# Patient Record
Sex: Male | Born: 1945 | Race: Black or African American | Hispanic: No | Marital: Married | Smoking: Former smoker
Health system: Southern US, Community
[De-identification: ages and names within clinical notes are randomized; demographics above are authoritative.]

## PROBLEM LIST (undated history)

## (undated) DIAGNOSIS — R569 Unspecified convulsions: Secondary | ICD-10-CM

## (undated) DIAGNOSIS — I10 Essential (primary) hypertension: Secondary | ICD-10-CM

## (undated) DIAGNOSIS — N19 Unspecified kidney failure: Secondary | ICD-10-CM

## (undated) DIAGNOSIS — F0390 Unspecified dementia without behavioral disturbance: Secondary | ICD-10-CM

## (undated) DIAGNOSIS — E119 Type 2 diabetes mellitus without complications: Secondary | ICD-10-CM

## (undated) DIAGNOSIS — K7290 Hepatic failure, unspecified without coma: Secondary | ICD-10-CM

## (undated) DIAGNOSIS — B1920 Unspecified viral hepatitis C without hepatic coma: Secondary | ICD-10-CM

---

## 2009-05-21 DIAGNOSIS — D631 Anemia in chronic kidney disease: Secondary | ICD-10-CM | POA: Diagnosis present

## 2009-05-21 DIAGNOSIS — D638 Anemia in other chronic diseases classified elsewhere: Secondary | ICD-10-CM

## 2009-05-21 DIAGNOSIS — D649 Anemia, unspecified: Secondary | ICD-10-CM

## 2014-09-01 HISTORY — PX: LIVER TRANSPLANT: SHX410

## 2014-09-01 HISTORY — PX: KIDNEY TRANSPLANT: SHX239

## 2015-09-02 DIAGNOSIS — R000 Tachycardia, unspecified: Secondary | ICD-10-CM

## 2015-09-02 HISTORY — PX: SVT ABLATION: EP1225

## 2015-09-02 HISTORY — DX: Tachycardia, unspecified: R00.0

## 2016-09-01 DIAGNOSIS — I728 Aneurysm of other specified arteries: Secondary | ICD-10-CM

## 2016-09-01 HISTORY — DX: Aneurysm of other specified arteries: I72.8

## 2017-09-01 DIAGNOSIS — I639 Cerebral infarction, unspecified: Secondary | ICD-10-CM

## 2017-09-01 HISTORY — DX: Cerebral infarction, unspecified: I63.9

## 2020-12-06 ENCOUNTER — Encounter (HOSPITAL_COMMUNITY): Payer: Self-pay

## 2020-12-06 ENCOUNTER — Emergency Department (HOSPITAL_COMMUNITY)
Admission: EM | Admit: 2020-12-06 | Discharge: 2020-12-06 | Disposition: A | Discharge status: Home / Self Care | Payer: Medicare Other | Attending: Emergency Medicine | Admitting: Emergency Medicine

## 2020-12-06 DIAGNOSIS — I441 Atrioventricular block, second degree: Secondary | ICD-10-CM

## 2020-12-06 DIAGNOSIS — R531 Weakness: Secondary | ICD-10-CM | POA: Insufficient documentation

## 2020-12-06 DIAGNOSIS — G40909 Epilepsy, unspecified, not intractable, without status epilepticus: Secondary | ICD-10-CM | POA: Insufficient documentation

## 2020-12-06 DIAGNOSIS — R569 Unspecified convulsions: Secondary | ICD-10-CM | POA: Diagnosis present

## 2020-12-06 HISTORY — DX: Unspecified convulsions: R56.9

## 2020-12-06 LAB — COMPREHENSIVE METABOLIC PANEL
ALT: 15 U/L (ref 0–44)
AST: 15 U/L (ref 15–41)
Albumin: 3.4 g/dL — ABNORMAL LOW (ref 3.5–5.0)
Alkaline Phosphatase: 72 U/L (ref 38–126)
Anion gap: 9 (ref 5–15)
BUN: 32 mg/dL — ABNORMAL HIGH (ref 8–23)
CO2: 20 mmol/L — ABNORMAL LOW (ref 22–32)
Calcium: 8.6 mg/dL — ABNORMAL LOW (ref 8.9–10.3)
Chloride: 114 mmol/L — ABNORMAL HIGH (ref 98–111)
Creatinine, Ser: 1.88 mg/dL — ABNORMAL HIGH (ref 0.61–1.24)
GFR, Estimated: 37 mL/min — ABNORMAL LOW (ref >60)
Glucose, Bld: 119 mg/dL — ABNORMAL HIGH (ref 70–99)
Potassium: 3.5 mmol/L (ref 3.5–5.1)
Sodium: 143 mmol/L (ref 135–145)
Total Bilirubin: 0.6 mg/dL (ref 0.3–1.2)
Total Protein: 6.7 g/dL (ref 6.5–8.1)

## 2020-12-06 LAB — CBC WITH DIFFERENTIAL/PLATELET
Abs Immature Granulocytes: 0.04 10*3/uL (ref 0.00–0.07)
Basophils Absolute: 0.1 10*3/uL (ref 0.0–0.1)
Basophils Relative: 1 %
Eosinophils Absolute: 0 10*3/uL (ref 0.0–0.5)
Eosinophils Relative: 1 %
HCT: 34.5 % — ABNORMAL LOW (ref 39.0–52.0)
Hemoglobin: 10.6 g/dL — ABNORMAL LOW (ref 13.0–17.0)
Immature Granulocytes: 1 %
Lymphocytes Relative: 10 %
Lymphs Abs: 0.8 10*3/uL (ref 0.7–4.0)
MCH: 23.8 pg — ABNORMAL LOW (ref 26.0–34.0)
MCHC: 30.7 g/dL (ref 30.0–36.0)
MCV: 77.5 fL — ABNORMAL LOW (ref 80.0–100.0)
Monocytes Absolute: 0.4 10*3/uL (ref 0.1–1.0)
Monocytes Relative: 5 %
Neutro Abs: 7.3 10*3/uL (ref 1.7–7.7)
Neutrophils Relative %: 82 %
Platelets: 274 10*3/uL (ref 150–400)
RBC: 4.45 MIL/uL (ref 4.22–5.81)
RDW: 17.1 % — ABNORMAL HIGH (ref 11.5–15.5)
WBC: 8.8 10*3/uL (ref 4.0–10.5)
nRBC: 0 % (ref 0.0–0.2)

## 2020-12-06 LAB — ETHANOL: Alcohol, Ethyl (B): <10 mg/dL (ref <10)

## 2020-12-06 MED ORDER — LEVETIRACETAM IN NACL 1000 MG/100ML IV SOLN
1000.0000 mg | Freq: Once | INTRAVENOUS | Status: AC
  Administered 2020-12-06: 1000 mg via INTRAVENOUS
  Filled 2020-12-06: qty 100

## 2020-12-06 NOTE — ED Triage Notes (Signed)
Pt comes via [facility] from home, seizure witnessed by wife lasting about 2 minutes, hx of the same, wife forgot to give him his keppa last night, pt post ictal, disoriented x 4.

## 2020-12-06 NOTE — Consult Note (Signed)
Cardiology Consultation:   Patient ID: Gilbert Wilson MRN: [id number]; DOB: 12-01-1945  Admit date: 12/06/2020 Date of Consult: 12/06/2020  PCP:  Pcp, No   [place]  Cardiologist: [place]. (will established care at [facility])   Patient Profile:   Gilbert Wilson is a 75 y.o. 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   Patient moved from [place] with family February 2022.  Patient with history of hypertension since teenager.  His stroke felt secondary to uncontrolled hypertension.  He is on Plavix for this. Patient with history of tachycardia and underwent ablation in 2017 @ [place].  Family unable to provide type of tachycardia.  Patient had a cardiac catheterization without evidence of CAD.  Never told of abnormal echocardiogram.  History of Present Illness:   Mr. Luft brought by EMS for witnessed seizure at home by wife.  Patient with cognitive impairment.  History mostly provided by daughter at bedside and wife on phone.  For some reason family forgot to give his Keppra for past few days.  He is wheelchair-bound.  He denies chest pain, shortness of breath, orthopnea, PND, syncope, lower extremity edema or melena.  His seizure lasted for about 2 minutes.  He was given IV Keppra in emergency room.  Serum creatinine 1.88 Potassium 3.5 Hemoglobin 10.6  Patient noted to be bradycardic in emergency room and cardiology asked for further evaluation. Family denies prior history of bradycardia, dizziness or syncope.   Past Medical History:  Diagnosis Date  . Seizure ([place])    As above. History reviewed. No pertinent surgical history.   Home  Medications:  Prior to Admission medications   Not on File   Inpatient Medications: Scheduled Meds:  Continuous Infusions:  PRN Meds:   Allergies:   No Known Allergies  Social History:   Social History   Socioeconomic History  . Marital status: Married    Spouse name: Not on file  . Number of children: Not on file  . Years of education: Not on file  . Highest education level: Not on file  Occupational History  . Not on file  Tobacco Use  . Smoking status: Not on file  . Smokeless tobacco: Not on file  Substance and Sexual Activity  . Alcohol use: Not on file  . Drug use: Not on file  . Sexual activity: Not on file  Other Topics Concern  . Not on file  Social History Narrative  . Not on file   Social Determinants of Health   Financial Resource Strain: Not on file  Food Insecurity: Not on file  Transportation Needs: Not on file  Physical Activity: Not on file  Stress: Not on file  Social Connections: Not on file  Intimate Partner Violence: Not on file    Family History:   Denies family history of CAD  ROS:  Please see the history of present illness.  All other ROS reviewed and negative.     Physical Exam/Data:   Vitals:   12/06/20 1215 12/06/20 1230 12/06/20 1245 12/06/20 1300  BP: (!) 158/73 (!) 149/70 (!) 162/68 (!) 153/78  Pulse: (!) 55 78 74 78  Resp: 19 (!) '21 12 14  '$ Temp:  TempSrc:      SpO2: 96% 96% 99% 96%   No intake or output data in the 24 hours ending 12/06/20 1333 No flowsheet data found.   There is no height or weight on file to calculate BMI.  General:  Well nourished, well developed, in no acute distress HEENT: normal Lymph: no adenopathy Neck: no JVD Endocrine:  No thryomegaly Vascular: No carotid bruits; FA pulses 2+ bilaterally without bruits  Cardiac:  normal S1, S2; RRR; no murmur Lungs:  clear to auscultation bilaterally, no wheezing, rhonchi or rales  Abd: soft, nontender, no hepatomegaly  Ext: no  edema Musculoskeletal:  No deformities,  Skin: warm and dry  Neuro:  Right sided weakness  Psych:  Dementia   EKG:  The EKG was personally reviewed and demonstrates:  SR, HR 77, prolonged PR internal Telemetry:  Telemetry was personally reviewed and demonstrates: Predominantly sinus rhythm at heart rate of 70s, intermittent Wenckebach bradycardia  Relevant CV Studies: N/A  Laboratory Data:  Chemistry Recent Labs  Lab 12/06/20 0743  NA 143  K 3.5  CL 114*  CO2 20*  GLUCOSE 119*  BUN 32*  CREATININE 1.88*  CALCIUM 8.6*  GFRNONAA 37*  ANIONGAP 9    Recent Labs  Lab 12/06/20 0743  PROT 6.7  ALBUMIN 3.4*  AST 15  ALT 15  ALKPHOS 72  BILITOT 0.6   Hematology Recent Labs  Lab 12/06/20 0743  WBC 8.8  RBC 4.45  HGB 10.6*  HCT 34.5*  MCV 77.5*  MCH 23.8*  MCHC 30.7  RDW 17.1*  PLT 274    Radiology/Studies:  No results found.   Assessment and Plan:   1. Mobitz type I AV block (Wenckebach) -Patient with prior history of ablation for tachycardia (unknown) in 2017 at [place]. -Patient was never told of any bradycardia or arrhythmia requiring anticoagulation -Review of telemetry showed predominately normal sinus rhythm with intermittent Mobitz type I AV block with first-degree AV block. -He is not on any AV nodal blocking agent however on nifedipine which may have minimal effect on HR - His bradycardia is a incidental findings and pt is asymptomatic.  -May consider event monitor in outpatient setting.  He has appointment with primary care provider at [facility] next week.  From there he may refer to cardiology for further work-up.  Family prefers to stays in [place] system.  2.  Seizure disorder -Recommended compliance with medications  3 CKD stage III -Likely due to history of renal transplant -Unknown baseline renal function  4.  Hypertension -Blood pressure is elevated -Family reported patient with history of hypertension since teenager   Risk  Assessment/Risk Scores:    For questions or updates, please contact [place] Please consult [email or web address] for contact info under    Jarrett Soho, PA  12/06/2020 1:33 PM

## 2020-12-06 NOTE — Discharge Instructions (Addendum)
I suspect that your seizure was due to not taking your Keppra.  Your levetiracetam (Keppra) level is still pending  However, based on history I suspect that it will be low and subtherapeutic.   Resume your Keppra this evening and continue with the 500 mg twice daily.  You will need to follow-up with the [facility] next week for your appointment, as scheduled.  I suspect having specialty follow-up with both cardiology and neurology.  You need to be seeing a neurologist for your seizure disorder and cardiologist for your arrhythmia.  Please return to the ED or seek immediate medical attention should you experience any new or worsening symptoms.

## 2020-12-06 NOTE — ED Provider Notes (Signed)
Gilbert Wilson Provider Note   CSN: [id number] Arrival date & time: 12/06/20  [id number]     History Chief Complaint  Patient presents with  . Seizures    Gilbert Wilson is a 75 y.o. male with past medical history significant for CVA with right-sided weakness on Plavix, seizure disorder on Keppra 500 mg twice daily, and on immunotherapy s/p kidney and liver transplant who presents the ED via EMS accompanied by his wife for seizure.  On my examination, wife is teary-eyed and feels guilty because her husband had been without his Keppra for what she suspects is a few days.  He has a large amount of medicines that he takes regularly.  She states that he has cognitive impairment and residual right-sided weakness from his stroke for which she is on Plavix and takes regularly.  She states that they just moved down to [place] from [place] and have an appointment with the [place] next week.  She would like to keep both his primary care and neurology services within the [place] system.    Patient is resting comfortably and is in no acute distress.  He is answering my questions appropriately.  He cannot tell me the year, but patient's wife states that this is his cognitive baseline.  They deny any recent fevers, chills, or other illness.  He denies any illicit drug use.   His seizures are full body convulsions and today's episode lasted about 6 minutes.  She states that he was initially confused after the seizure ended, but he started to come around by the time EMS arrived.  He denies any tongue biting.  He is wheelchair-bound and incontinent at baseline.  HPI     Past Medical History:  Diagnosis Date  . Seizure ([place])     There are no problems to display for this patient.   History reviewed. No pertinent surgical history.     No family history on file.     Home Medications Prior to Admission medications   Not on File    Allergies    Patient has no  known allergies.  Review of Systems   Review of Systems  All other systems reviewed and are negative.   Physical Exam Updated Vital Signs BP (!) 170/69   Pulse 76   Temp (!) 97 F (36.1 C) (Temporal)   Resp 14   SpO2 100%   Physical Exam Vitals and nursing note reviewed. Exam conducted with a chaperone present.  Constitutional:      General: He is not in acute distress.    Appearance: He is not toxic-appearing.  HENT:     Head: Normocephalic and atraumatic.     Mouth/Throat:     Pharynx: Oropharynx is clear.     Comments: No tongue biting. Eyes:     General: No scleral icterus.    Conjunctiva/sclera: Conjunctivae normal.  Cardiovascular:     Rate and Rhythm: Normal rate.     Pulses: Normal pulses.     Comments: Right arm radial pulse with thrill, hx AV fistula. Pulmonary:     Effort: Pulmonary effort is normal. No respiratory distress.  Musculoskeletal:     Cervical back: Normal range of motion.  Skin:    General: Skin is dry.  Neurological:     Mental Status: He is alert.     GCS: GCS eye subscore is 4. GCS verbal subscore is 5. GCS motor subscore is 6.     Comments: Right-sided extremity  weakness.  No facial droop.  PERRL and EOM intact.  No nystagmus.  Sensation grossly intact and symmetric bilaterally.  Cannot tell me the year, but cognitive baseline.  Psychiatric:        Mood and Affect: Mood normal.        Behavior: Behavior normal.        Thought Content: Thought content normal.     ED Results / Procedures / Treatments   Labs (all labs ordered are listed, but only abnormal results are displayed) Labs Reviewed  CBC WITH DIFFERENTIAL/PLATELET - Abnormal; Notable for the following components:      Result Value   Hemoglobin 10.6 (*)    HCT 34.5 (*)    MCV 77.5 (*)    MCH 23.8 (*)    RDW 17.1 (*)    All other components within normal limits  COMPREHENSIVE METABOLIC PANEL - Abnormal; Notable for the following components:   Chloride 114 (*)    CO2 20  (*)    Glucose, Bld 119 (*)    BUN 32 (*)    Creatinine, Ser 1.88 (*)    Calcium 8.6 (*)    Albumin 3.4 (*)    GFR, Estimated 37 (*)    All other components within normal limits  ETHANOL  LEVETIRACETAM LEVEL    EKG EKG Interpretation  Date/Time:  Thursday December 06 2020 08:47:30 EDT Ventricular Rate:  77 PR Interval:  317 QRS Duration: 107 QT Interval:  429 QTC Calculation: 486 R Axis:   -37 Text Interpretation: Sinus rhythm Prolonged PR interval Left ventricular hypertrophy Borderline prolonged QT interval Confirmed by Fredia Sorrow [phone]) on 12/06/2020 8:57:39 AM   Radiology No results found.  Procedures Procedures   Medications Ordered in ED Medications  levETIRAcetam (KEPPRA) IVPB 1000 mg/100 mL premix (0 mg Intravenous Stopped 12/06/20 0944)    ED Course  I have reviewed the triage vital signs and the nursing notes.  Pertinent labs & imaging results that were available during my care of the patient were reviewed by me and considered in my medical decision making (see chart for details).    MDM Rules/Calculators/A&P                          Gilbert Wilson was evaluated in [facility] on 12/06/2020 for the symptoms described in the history of present illness. He was evaluated in the context of the global COVID-19 pandemic, which necessitated consideration that the patient might be at risk for infection with the SARS-CoV-2 virus that causes COVID-19. Institutional protocols and algorithms that pertain to the evaluation of patients at risk for COVID-19 are in a state of rapid change based on information released by regulatory bodies including the CDC and federal and state organizations. These policies and algorithms were followed during the patient's care in the ED.  I personally reviewed patient's medical chart and all notes from triage and staff during today's encounter. I have also ordered and reviewed all labs and imaging that I felt to be medically  necessary in the evaluation of this patient's complaints and with consideration of their physical exam. If needed, translation services were available and utilized.   I suspect that his seizure today was due to medication noncompliance, however wife is taking the necessary steps to rectify state.  She was teary-eyed on my initial examination and remorseful.  She is doing an excellent job caring for her husband who has significant comorbid disease.  They just moved  down here from [place] and plan to establish with the [place].  They have an appointment next week with primary care and also would like to have neurology services there as well.  She declines referral to [facility] or [facility].   Patient is resting comfortably on my exam.  Will Keppra load him given that he has been without Keppra medication for quite some time.  Laboratory work-up demonstrating mild anemia with hemoglobin 10.6 and renal impairment with creatinine elevated at 1.88, mildly elevated BUN 32, and reduced GFR to 37.  No recent labs with which to compare.  Unclear baseline.  However, wife at bedside states that his creatinine is typically 1.9-2.0 and GFR is typically low 30s.  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  He is resting comfortably and does not appear to be particularly ill.  Patient and wife at bedside states that he has been eating and drinking well.  EKG notable for prolonged PR interval suggesting first-degree heart block.  However, while on the monitor in the room, he has periodically dropped to heart rate as low as 37 and with the EKG findings suggesting second-degree heart block, unclear type I versus type II.  He is asymptomatic and denying any palpitations or chest discomfort.  No shortness of breath.  His blood pressure has been stable.  Wife at bedside states that this is happened before.  No history of ICD placement.  He did have an  ablation in the past.  Will consult cardiology to see patient.  I spoke with Trish from Cath Lab at 9:55 AM.  Plan is for him to be evaluated by cardiology at bedside.  I confirmed with Trish at 12:45 PM that plan is still for cardiology evaluation at bedside.  Patient continues to be resting comfortably and in no acute distress.  He continues to deny any cardiac complaints.  Wife is not wanting a cardiac monitor and states that he has dipped as low as 50, but no further significant bradycardia.  Patient was evaluated by cardiology at bedside.  They note that he has a second-degree AV block, type I.  He is cleared from their perspective and feel as though he can follow-up with [facility] cardiology [facility].  They plan to follow-up with the [facility] for their appointment as scheduled next week.  ED return precautions discussed.  Patient and wife at bedside voiced understanding and are agreeable to the plan.  They will resume taking Keppra, as scheduled, this evening.    Final Clinical Impression(s) / ED Diagnoses Final diagnoses:  Seizure [facility])    Rx / [facility] Orders ED Discharge Orders    None       Corena Herter, PA-C 12/06/20 1422    Fredia Sorrow, MD 12/07/20 [phone]

## 2020-12-11 LAB — LEVETIRACETAM LEVEL: Levetiracetam Lvl: <1 ug/mL — ABNORMAL LOW (ref 10.0–40.0)

## 2021-07-04 ENCOUNTER — Emergency Department (HOSPITAL_COMMUNITY): Payer: No Typology Code available for payment source

## 2021-07-04 ENCOUNTER — Encounter (HOSPITAL_COMMUNITY): Payer: Self-pay | Admitting: Pharmacy Technician

## 2021-07-04 ENCOUNTER — Other Ambulatory Visit: Payer: Self-pay

## 2021-07-04 ENCOUNTER — Inpatient Hospital Stay (HOSPITAL_COMMUNITY)
Admission: EM | Admit: 2021-07-04 | Discharge: 2021-07-07 | DRG: 074 | Disposition: A | Discharge status: Home Health | Payer: No Typology Code available for payment source | Attending: Internal Medicine | Admitting: Internal Medicine

## 2021-07-04 DIAGNOSIS — I951 Orthostatic hypotension: Secondary | ICD-10-CM | POA: Diagnosis present

## 2021-07-04 DIAGNOSIS — Z888 Allergy status to other drugs, medicaments and biological substances status: Secondary | ICD-10-CM | POA: Diagnosis not present

## 2021-07-04 DIAGNOSIS — Z7902 Long term (current) use of antithrombotics/antiplatelets: Secondary | ICD-10-CM

## 2021-07-04 DIAGNOSIS — F0390 Unspecified dementia without behavioral disturbance: Secondary | ICD-10-CM | POA: Diagnosis present

## 2021-07-04 DIAGNOSIS — N400 Enlarged prostate without lower urinary tract symptoms: Secondary | ICD-10-CM | POA: Diagnosis present

## 2021-07-04 DIAGNOSIS — I1310 Hypertensive heart and chronic kidney disease without heart failure, with stage 1 through stage 4 chronic kidney disease, or unspecified chronic kidney disease: Secondary | ICD-10-CM | POA: Diagnosis not present

## 2021-07-04 DIAGNOSIS — E1151 Type 2 diabetes mellitus with diabetic peripheral angiopathy without gangrene: Secondary | ICD-10-CM | POA: Diagnosis present

## 2021-07-04 DIAGNOSIS — G909 Disorder of the autonomic nervous system, unspecified: Principal | ICD-10-CM | POA: Diagnosis present

## 2021-07-04 DIAGNOSIS — Z87891 Personal history of nicotine dependence: Secondary | ICD-10-CM

## 2021-07-04 DIAGNOSIS — I69351 Hemiplegia and hemiparesis following cerebral infarction affecting right dominant side: Secondary | ICD-10-CM

## 2021-07-04 DIAGNOSIS — I129 Hypertensive chronic kidney disease with stage 1 through stage 4 chronic kidney disease, or unspecified chronic kidney disease: Secondary | ICD-10-CM | POA: Diagnosis present

## 2021-07-04 DIAGNOSIS — D631 Anemia in chronic kidney disease: Secondary | ICD-10-CM | POA: Diagnosis present

## 2021-07-04 DIAGNOSIS — G903 Multi-system degeneration of the autonomic nervous system: Secondary | ICD-10-CM | POA: Diagnosis present

## 2021-07-04 DIAGNOSIS — N1832 Chronic kidney disease, stage 3b: Secondary | ICD-10-CM | POA: Diagnosis present

## 2021-07-04 DIAGNOSIS — E1143 Type 2 diabetes mellitus with diabetic autonomic (poly)neuropathy: Secondary | ICD-10-CM | POA: Diagnosis present

## 2021-07-04 DIAGNOSIS — N2889 Other specified disorders of kidney and ureter: Secondary | ICD-10-CM | POA: Diagnosis not present

## 2021-07-04 DIAGNOSIS — E669 Obesity, unspecified: Secondary | ICD-10-CM | POA: Diagnosis present

## 2021-07-04 DIAGNOSIS — I151 Hypertension secondary to other renal disorders: Secondary | ICD-10-CM | POA: Diagnosis not present

## 2021-07-04 DIAGNOSIS — Z79899 Other long term (current) drug therapy: Secondary | ICD-10-CM

## 2021-07-04 DIAGNOSIS — I441 Atrioventricular block, second degree: Secondary | ICD-10-CM | POA: Diagnosis present

## 2021-07-04 DIAGNOSIS — Z833 Family history of diabetes mellitus: Secondary | ICD-10-CM

## 2021-07-04 DIAGNOSIS — Z940 Kidney transplant status: Secondary | ICD-10-CM | POA: Diagnosis not present

## 2021-07-04 DIAGNOSIS — Z20822 Contact with and (suspected) exposure to covid-19: Secondary | ICD-10-CM | POA: Diagnosis present

## 2021-07-04 DIAGNOSIS — N189 Chronic kidney disease, unspecified: Secondary | ICD-10-CM | POA: Diagnosis not present

## 2021-07-04 DIAGNOSIS — R001 Bradycardia, unspecified: Secondary | ICD-10-CM | POA: Diagnosis present

## 2021-07-04 DIAGNOSIS — E1122 Type 2 diabetes mellitus with diabetic chronic kidney disease: Secondary | ICD-10-CM | POA: Diagnosis present

## 2021-07-04 DIAGNOSIS — Z7960 Long term (current) use of unspecified immunomodulators and immunosuppressants: Secondary | ICD-10-CM

## 2021-07-04 DIAGNOSIS — Z7189 Other specified counseling: Secondary | ICD-10-CM | POA: Diagnosis not present

## 2021-07-04 DIAGNOSIS — G40909 Epilepsy, unspecified, not intractable, without status epilepticus: Secondary | ICD-10-CM | POA: Diagnosis present

## 2021-07-04 DIAGNOSIS — R32 Unspecified urinary incontinence: Secondary | ICD-10-CM | POA: Diagnosis present

## 2021-07-04 DIAGNOSIS — Z515 Encounter for palliative care: Secondary | ICD-10-CM | POA: Diagnosis not present

## 2021-07-04 DIAGNOSIS — Z7401 Bed confinement status: Secondary | ICD-10-CM | POA: Diagnosis not present

## 2021-07-04 DIAGNOSIS — R531 Weakness: Secondary | ICD-10-CM | POA: Diagnosis not present

## 2021-07-04 DIAGNOSIS — Z8619 Personal history of other infectious and parasitic diseases: Secondary | ICD-10-CM | POA: Diagnosis not present

## 2021-07-04 DIAGNOSIS — R55 Syncope and collapse: Secondary | ICD-10-CM | POA: Diagnosis not present

## 2021-07-04 DIAGNOSIS — R9431 Abnormal electrocardiogram [ECG] [EKG]: Secondary | ICD-10-CM | POA: Diagnosis not present

## 2021-07-04 DIAGNOSIS — D84821 Immunodeficiency due to drugs: Secondary | ICD-10-CM | POA: Diagnosis present

## 2021-07-04 DIAGNOSIS — Z6826 Body mass index (BMI) 26.0-26.9, adult: Secondary | ICD-10-CM

## 2021-07-04 DIAGNOSIS — Z944 Liver transplant status: Secondary | ICD-10-CM

## 2021-07-04 DIAGNOSIS — Z8249 Family history of ischemic heart disease and other diseases of the circulatory system: Secondary | ICD-10-CM

## 2021-07-04 HISTORY — DX: Unspecified viral hepatitis C without hepatic coma: B19.20

## 2021-07-04 HISTORY — DX: Hepatic failure, unspecified without coma: K72.90

## 2021-07-04 HISTORY — DX: Unspecified dementia, unspecified severity, without behavioral disturbance, psychotic disturbance, mood disturbance, and anxiety: F03.90

## 2021-07-04 HISTORY — DX: Unspecified kidney failure: N19

## 2021-07-04 HISTORY — DX: Essential (primary) hypertension: I10

## 2021-07-04 HISTORY — DX: Type 2 diabetes mellitus without complications: E11.9

## 2021-07-04 LAB — COMPREHENSIVE METABOLIC PANEL
ALT: 10 U/L (ref 0–44)
AST: 11 U/L — ABNORMAL LOW (ref 15–41)
Albumin: 3.3 g/dL — ABNORMAL LOW (ref 3.5–5.0)
Alkaline Phosphatase: 87 U/L (ref 38–126)
Anion gap: 9 (ref 5–15)
BUN: 30 mg/dL — ABNORMAL HIGH (ref 8–23)
CO2: 21 mmol/L — ABNORMAL LOW (ref 22–32)
Calcium: 8.8 mg/dL — ABNORMAL LOW (ref 8.9–10.3)
Chloride: 111 mmol/L (ref 98–111)
Creatinine, Ser: 1.93 mg/dL — ABNORMAL HIGH (ref 0.61–1.24)
GFR, Estimated: 36 mL/min — ABNORMAL LOW (ref >60)
Glucose, Bld: 101 mg/dL — ABNORMAL HIGH (ref 70–99)
Potassium: 4.1 mmol/L (ref 3.5–5.1)
Sodium: 141 mmol/L (ref 135–145)
Total Bilirubin: 0.8 mg/dL (ref 0.3–1.2)
Total Protein: 7 g/dL (ref 6.5–8.1)

## 2021-07-04 LAB — CBC WITH DIFFERENTIAL/PLATELET
Abs Immature Granulocytes: 0.01 10*3/uL (ref 0.00–0.07)
Basophils Absolute: 0.1 10*3/uL (ref 0.0–0.1)
Basophils Relative: 1 %
Eosinophils Absolute: 0.1 10*3/uL (ref 0.0–0.5)
Eosinophils Relative: 2 %
HCT: 37.6 % — ABNORMAL LOW (ref 39.0–52.0)
Hemoglobin: 11.5 g/dL — ABNORMAL LOW (ref 13.0–17.0)
Immature Granulocytes: 0 %
Lymphocytes Relative: 30 %
Lymphs Abs: 1.3 10*3/uL (ref 0.7–4.0)
MCH: 23.7 pg — ABNORMAL LOW (ref 26.0–34.0)
MCHC: 30.6 g/dL (ref 30.0–36.0)
MCV: 77.5 fL — ABNORMAL LOW (ref 80.0–100.0)
Monocytes Absolute: 0.4 10*3/uL (ref 0.1–1.0)
Monocytes Relative: 9 %
Neutro Abs: 2.5 10*3/uL (ref 1.7–7.7)
Neutrophils Relative %: 58 %
Platelets: 233 10*3/uL (ref 150–400)
RBC: 4.85 MIL/uL (ref 4.22–5.81)
RDW: 15.3 % (ref 11.5–15.5)
WBC: 4.4 10*3/uL (ref 4.0–10.5)
nRBC: 0 % (ref 0.0–0.2)

## 2021-07-04 LAB — IRON AND TIBC
Iron: 62 ug/dL (ref 45–182)
Saturation Ratios: 28 % (ref 17.9–39.5)
TIBC: 220 ug/dL — ABNORMAL LOW (ref 250–450)
UIBC: 158 ug/dL

## 2021-07-04 LAB — GLUCOSE, CAPILLARY: Glucose-Capillary: 156 mg/dL — ABNORMAL HIGH (ref 70–99)

## 2021-07-04 LAB — PROTIME-INR
INR: 1.2 (ref 0.8–1.2)
Prothrombin Time: 14.7 seconds (ref 11.4–15.2)

## 2021-07-04 LAB — RESP PANEL BY RT-PCR (FLU A&B, COVID) ARPGX2
Influenza A by PCR: NEGATIVE
Influenza B by PCR: NEGATIVE
SARS Coronavirus 2 by RT PCR: NEGATIVE

## 2021-07-04 LAB — FERRITIN: Ferritin: 116 ng/mL (ref 24–336)

## 2021-07-04 LAB — TSH: TSH: 2.28 u[IU]/mL (ref 0.350–4.500)

## 2021-07-04 LAB — HEMOGLOBIN A1C
Hgb A1c MFr Bld: 6.4 % — ABNORMAL HIGH (ref 4.8–5.6)
Mean Plasma Glucose: 136.98 mg/dL

## 2021-07-04 LAB — ETHANOL: Alcohol, Ethyl (B): <10 mg/dL (ref <10)

## 2021-07-04 LAB — MAGNESIUM: Magnesium: 1.8 mg/dL (ref 1.7–2.4)

## 2021-07-04 MED ORDER — SODIUM CHLORIDE 0.9 % IV SOLN: INTRAVENOUS | Status: DC

## 2021-07-04 MED ORDER — SODIUM BICARBONATE 650 MG PO TABS
1300.0000 mg | ORAL_TABLET | Freq: Two times a day (BID) | ORAL | Status: DC
  Administered 2021-07-04 – 2021-07-07 (×6): 1300 mg via ORAL
  Filled 2021-07-04 (×6): qty 2

## 2021-07-04 MED ORDER — ONDANSETRON HCL 4 MG PO TABS: 4.0000 mg | ORAL_TABLET | Freq: Four times a day (QID) | ORAL | Status: DC | PRN

## 2021-07-04 MED ORDER — SENNOSIDES-DOCUSATE SODIUM 8.6-50 MG PO TABS: 1.0000 | ORAL_TABLET | Freq: Every evening | ORAL | Status: DC | PRN

## 2021-07-04 MED ORDER — INSULIN ASPART 100 UNIT/ML IJ SOLN
0.0000 [IU] | Freq: Three times a day (TID) | INTRAMUSCULAR | Status: DC
  Administered 2021-07-05 – 2021-07-06 (×3): 3 [IU] via SUBCUTANEOUS
  Administered 2021-07-07: 5 [IU] via SUBCUTANEOUS

## 2021-07-04 MED ORDER — SODIUM CHLORIDE 0.9 % IV SOLN: 250.0000 mL | INTRAVENOUS | Status: DC

## 2021-07-04 MED ORDER — ENOXAPARIN SODIUM 30 MG/0.3ML IJ SOSY
30.0000 mg | PREFILLED_SYRINGE | INTRAMUSCULAR | Status: DC
  Administered 2021-07-04 – 2021-07-05 (×2): 30 mg via SUBCUTANEOUS
  Filled 2021-07-04 (×2): qty 0.3

## 2021-07-04 MED ORDER — ACETAMINOPHEN 650 MG RE SUPP: 650.0000 mg | Freq: Four times a day (QID) | RECTAL | Status: DC | PRN

## 2021-07-04 MED ORDER — SODIUM CHLORIDE 0.9 % IV BOLUS
1000.0000 mL | Freq: Once | INTRAVENOUS | Status: AC
  Administered 2021-07-04: 1000 mL via INTRAVENOUS

## 2021-07-04 MED ORDER — CEFAZOLIN SODIUM-DEXTROSE 2-4 GM/100ML-% IV SOLN
2.0000 g | INTRAVENOUS | Status: DC
  Filled 2021-07-04: qty 100

## 2021-07-04 MED ORDER — SODIUM CHLORIDE 0.9% FLUSH: 3.0000 mL | Freq: Two times a day (BID) | INTRAVENOUS | Status: DC

## 2021-07-04 MED ORDER — SODIUM CHLORIDE 0.9% FLUSH: 3.0000 mL | INTRAVENOUS | Status: DC | PRN

## 2021-07-04 MED ORDER — CALCITRIOL 0.25 MCG PO CAPS
0.2500 ug | ORAL_CAPSULE | Freq: Every day | ORAL | Status: DC
Start: 2021-07-04 — End: 2021-07-08
  Administered 2021-07-04 – 2021-07-07 (×4): 0.25 ug via ORAL
  Filled 2021-07-04 (×4): qty 1

## 2021-07-04 MED ORDER — CYCLOSPORINE 100 MG PO CAPS: 100.0000 mg | ORAL_CAPSULE | Freq: Two times a day (BID) | ORAL | Status: DC

## 2021-07-04 MED ORDER — CYCLOSPORINE MODIFIED (NEORAL) 25 MG PO CAPS
100.0000 mg | ORAL_CAPSULE | Freq: Two times a day (BID) | ORAL | Status: DC
  Administered 2021-07-04 – 2021-07-07 (×6): 100 mg via ORAL
  Filled 2021-07-04 (×7): qty 4

## 2021-07-04 MED ORDER — MIRTAZAPINE 15 MG PO TABS
15.0000 mg | ORAL_TABLET | Freq: Every day | ORAL | Status: DC
  Administered 2021-07-04 – 2021-07-06 (×3): 15 mg via ORAL
  Filled 2021-07-04 (×3): qty 1

## 2021-07-04 MED ORDER — CINACALCET HCL 30 MG PO TABS
30.0000 mg | ORAL_TABLET | Freq: Two times a day (BID) | ORAL | Status: DC
  Administered 2021-07-05 – 2021-07-07 (×5): 30 mg via ORAL
  Filled 2021-07-04 (×5): qty 1

## 2021-07-04 MED ORDER — INSULIN ASPART 100 UNIT/ML IJ SOLN: 0.0000 [IU] | Freq: Three times a day (TID) | INTRAMUSCULAR | Status: DC

## 2021-07-04 MED ORDER — ONDANSETRON HCL 4 MG/2ML IJ SOLN: 4.0000 mg | Freq: Four times a day (QID) | INTRAMUSCULAR | Status: DC | PRN

## 2021-07-04 MED ORDER — MAGNESIUM OXIDE -MG SUPPLEMENT 400 (240 MG) MG PO TABS
400.0000 mg | ORAL_TABLET | Freq: Every day | ORAL | Status: DC
  Administered 2021-07-04 – 2021-07-07 (×4): 400 mg via ORAL
  Filled 2021-07-04 (×6): qty 1

## 2021-07-04 MED ORDER — CLOPIDOGREL BISULFATE 75 MG PO TABS: 75.0000 mg | ORAL_TABLET | Freq: Every day | ORAL | Status: DC

## 2021-07-04 MED ORDER — LOSARTAN POTASSIUM 50 MG PO TABS
50.0000 mg | ORAL_TABLET | Freq: Every day | ORAL | Status: DC
  Administered 2021-07-04 – 2021-07-06 (×3): 50 mg via ORAL
  Filled 2021-07-04 (×3): qty 1

## 2021-07-04 MED ORDER — ACETAMINOPHEN 325 MG PO TABS: 650.0000 mg | ORAL_TABLET | Freq: Four times a day (QID) | ORAL | Status: DC | PRN

## 2021-07-04 MED ORDER — ROSUVASTATIN CALCIUM 5 MG PO TABS
10.0000 mg | ORAL_TABLET | Freq: Every evening | ORAL | Status: DC
  Administered 2021-07-04 – 2021-07-07 (×4): 10 mg via ORAL
  Filled 2021-07-04 (×4): qty 2

## 2021-07-04 MED ORDER — MYCOPHENOLATE SODIUM 180 MG PO TBEC
180.0000 mg | DELAYED_RELEASE_TABLET | Freq: Two times a day (BID) | ORAL | Status: DC
  Administered 2021-07-04 – 2021-07-07 (×6): 180 mg via ORAL
  Filled 2021-07-04 (×6): qty 1

## 2021-07-04 MED ORDER — ENOXAPARIN SODIUM 40 MG/0.4ML IJ SOSY: 40.0000 mg | PREFILLED_SYRINGE | INTRAMUSCULAR | Status: DC

## 2021-07-04 MED ORDER — NIFEDIPINE ER OSMOTIC RELEASE 60 MG PO TB24
60.0000 mg | ORAL_TABLET | Freq: Two times a day (BID) | ORAL | Status: DC
  Administered 2021-07-04: 60 mg via ORAL
  Filled 2021-07-04 (×2): qty 1

## 2021-07-04 MED ORDER — SODIUM CHLORIDE 0.9 % IV SOLN
80.0000 mg | INTRAVENOUS | Status: DC
  Filled 2021-07-04: qty 2

## 2021-07-04 MED ORDER — LEVETIRACETAM 500 MG PO TABS
500.0000 mg | ORAL_TABLET | Freq: Two times a day (BID) | ORAL | Status: DC
  Administered 2021-07-04 – 2021-07-07 (×6): 500 mg via ORAL
  Filled 2021-07-04 (×6): qty 1

## 2021-07-04 MED ORDER — FINASTERIDE 5 MG PO TABS
5.0000 mg | ORAL_TABLET | Freq: Every evening | ORAL | Status: DC
  Administered 2021-07-04 – 2021-07-07 (×4): 5 mg via ORAL
  Filled 2021-07-04 (×4): qty 1

## 2021-07-04 NOTE — ED Provider Notes (Signed)
[facility] EMERGENCY DEPARTMENT Provider Note   CSN: [id number] Arrival date & time: 07/04/21  1113     History No chief complaint on file.   Gilbert Wilson is a 75 y.o. male.  HPI Patient with history of dementia presents from home with his wife who assists with the history.  They present at the request of his primary care physician and a Hydrologist. She notes that in addition to the patient's dementia is a history of seizures, prior stroke.  After a seizure-like episode about 2 weeks ago the patient has been more weak than usual, with no additional falls, but with instability with upright positioning, minimal ambulation.  No report of fever, vomiting, diarrhea.  The patient himself answers intermittent questions briefly, though with clear speech.  Level 5 caveat secondary to dementia.     Past Medical History:  Diagnosis Date   Seizure ([place])     There are no problems to display for this patient.   History reviewed. No pertinent surgical history.     No family history on file.     Home Medications Prior to Admission medications   Medication Sig Start Date End Date Taking? Authorizing Provider  calcitRIOL (ROCALTROL) 0.25 MCG capsule Take 0.25 mcg by mouth daily.   Yes [provider]  cinacalcet (SENSIPAR) 30 MG tablet Take 30 mg by mouth 2 (two) times daily with a meal.   Yes [provider]  clopidogrel (PLAVIX) 75 MG tablet Take 75 mg by mouth daily.   Yes [provider]  cycloSPORINE (SANDIMMUNE) 100 MG capsule Take 100 mg by mouth 2 (two) times daily.   Yes [provider]  Fe Bisgly-Succ-C-Thre-B12-FA (IRON-150 PO) Take 1 tablet by mouth daily.   Yes [provider]  finasteride (PROSCAR) 5 MG tablet Take 5 mg by mouth every evening.   Yes [provider]  furosemide (LASIX) 20 MG tablet Take 20 mg by mouth daily.   Yes [provider]  insulin glargine (LANTUS  SOLOSTAR) 100 UNIT/ML Solostar Pen Inject 6 Units into the skin at bedtime. 130-Take 6 units,   Yes [provider]  levETIRAcetam (KEPPRA) 500 MG tablet Take 500 mg by mouth 2 (two) times daily.   Yes [provider]  losartan (COZAAR) 50 MG tablet Take 50 mg by mouth daily.   Yes [provider]  magnesium oxide (MAG-OX) 400 MG tablet Take 400 mg by mouth daily.   Yes [provider]  mirtazapine (REMERON) 15 MG tablet Take 15 mg by mouth at bedtime.   Yes [provider]  mycophenolate (MYFORTIC) 180 MG EC tablet Take 180 mg by mouth 2 (two) times daily.   Yes [provider]  NIFEdipine (ADALAT CC) 60 MG 24 hr tablet Take 60 mg by mouth in the morning and at bedtime.   Yes [provider]  rosuvastatin (CRESTOR) 10 MG tablet Take 10 mg by mouth every evening.   Yes [provider]  sodium bicarbonate 650 MG tablet Take 1,300 mg by mouth 2 (two) times daily.   Yes [provider]    Allergies    Levaquin [levofloxacin], Linezolid, and Lisinopril  Review of Systems   Review of Systems  Unable to perform ROS: Dementia  Notably, patient is immunocompromised with history of prior transplant. Physical Exam Updated Vital Signs BP 130/68   Pulse (!) 35   Temp 98.4 F (36.9 C) (Oral)   Resp 18   SpO2 99%  Physical Exam Vitals and nursing note reviewed.  Constitutional:      General: He is not in acute distress.    Appearance: He is well-developed. He is not ill-appearing or diaphoretic.  HENT:     Head: Normocephalic and atraumatic.  Eyes:     Conjunctiva/sclera: Conjunctivae normal.  Cardiovascular:     Rate and Rhythm: Regular rhythm. Bradycardia present.  Pulmonary:     Effort: Pulmonary effort is normal. No respiratory distress.     Breath sounds: No stridor.  Abdominal:     General: There is no distension.  Skin:    General: Skin is warm and dry.  Neurological:     Mental Status: He is  alert and oriented to person, place, and time.     Cranial Nerves: No cranial nerve deficit.     Motor: Atrophy present. No tremor or abnormal muscle tone.     Comments: Answer some questions with brief, clear speech.  No gross facial asymmetry.  He moves his upper extremities spontaneously, follows commands with delay.  Psychiatric:        Cognition and Memory: Cognition is impaired. Memory is impaired.    ED Results / Procedures / Treatments   Labs (all labs ordered are listed, but only abnormal results are displayed) Labs Reviewed  COMPREHENSIVE METABOLIC PANEL - Abnormal; Notable for the following components:      Result Value   CO2 21 (*)    Glucose, Bld 101 (*)    BUN 30 (*)    Creatinine, Ser 1.93 (*)    Calcium 8.8 (*)    Albumin 3.3 (*)    AST 11 (*)    GFR, Estimated 36 (*)    All other components within normal limits  CBC WITH DIFFERENTIAL/PLATELET - Abnormal; Notable for the following components:   Hemoglobin 11.5 (*)    HCT 37.6 (*)    MCV 77.5 (*)    MCH 23.7 (*)    All other components within normal limits  ETHANOL  PROTIME-INR  MAGNESIUM  RAPID URINE DRUG SCREEN, HOSP PERFORMED  URINALYSIS, ROUTINE W REFLEX MICROSCOPIC  CBG MONITORING, ED    EKG EKG Interpretation  Date/Time:  Thursday July 04 2021 11:20:35 EDT Ventricular Rate:  67 PR Interval:  67 QRS Duration: 111 QT Interval:  473 QTC Calculation: 500 R Axis:   -41 Text Interpretation: with 2nd degree A-V block (Mobitz I) Probable left atrial enlargement Left anterior fascicular block Left ventricular hypertrophy Borderline prolonged QT interval Abnormal ECG Confirmed by Carmin Muskrat [phone]) on 07/04/2021 12:10:42 PM  Radiology CT HEAD WO CONTRAST  Result Date: 07/04/2021 CLINICAL DATA:  Altered mental status of unknown cause. EXAM: CT HEAD WITHOUT CONTRAST TECHNIQUE: Contiguous axial images were obtained from the base of the skull through the vertex without intravenous contrast.  COMPARISON:  None. FINDINGS: Brain: There is generalized brain atrophy. There are extensive chronic appearing small vessel ischemic changes throughout the cerebral hemispheric white matter. Chronic atrophic changes of brainstem. Old small vessel infarctions within the left thalamus. No sign of acute infarction, mass lesion, hemorrhage, hydrocephalus or extra-axial collection. Vascular: There is atherosclerotic calcification of the major vessels at the base of the brain. Skull: Negative Sinuses/Orbits: Clear/normal Other: None IMPRESSION: No acute or reversible finding. Cerebral Atrophy (ICD10-G31.9). Extensive chronic small-vessel ischemic changes throughout the brain as outlined above. Electronically Signed   By: Nelson Chimes M.D.   On: 07/04/2021 13:29   DG Chest Port 1 View  Result Date: 07/04/2021 CLINICAL DATA:  Altered level of consciousness. EXAM: PORTABLE CHEST 1 VIEW COMPARISON:  None. FINDINGS: Decreased lung volumes.  Minimal atelectasis in the bases. Heart size upper normal. Negative for heart failure. Small left pleural effusion or pleural thickening. Atherosclerotic aortic arch. IMPRESSION: Hypoventilation with decreased lung volume and mild bibasilar atelectasis Small left pleural effusion or pleural thickening. Electronically Signed   By: Franchot Gallo M.D.   On: 07/04/2021 12:51    Procedures Procedures   Medications Ordered in ED Medications  sodium chloride 0.9 % bolus 1,000 mL (1,000 mLs Intravenous New Bag/Given 07/04/21 1433)    And  0.9 %  sodium chloride infusion (has no administration in time range)    ED Course  I have reviewed the triage vital signs and the nursing notes.  Pertinent labs & imaging results that were available during my care of the patient were reviewed by me and considered in my medical decision making (see chart for details).  Consideration of fatigue, weakness, bradycardia, patient placed on continuous cardiac monitoring, pulse oximetry.  Pulse  oximetry 98% room air normal Cardiac monitor substantial variability, A. fib, sinus rhythm with Mobitz type I block, rate 30s/60s, abnormal   4:05 PM In similar condition, and continues to have variability on heart rate monitor, 30s/60s.  Initial labs reviewed, discussed, generally reassuring.  X-ray reviewed, no evidence for pneumonia.  Patient is no new oxygen requirement, no increased work of breathing, low suspicion for pulmonary condition.  Given the variability of his heart rate, description of fatigue, some suspicion for symptomatic anemia.  Patient is new to the area, has no local cardiologist though he has seen a [facility] manager in the past.  I discussed this case with our cardiology colleagues will follow as a consulting team.  I also had a conversation with patient's wife about his bradycardia, he is not on a beta-blocker, but is on calcium channel blocker.  With consideration of intermittent bradycardia of variable rhythm patient will require consideration of interventions including possible pacemaker.  This conversation started with his wife.  Other initial findings: Creatinine essentially baseline, though elevated.  No leukocytosis, no fever, also reassuring low suspicion for concurrent infection.  COVID test/influenza pending on admission. MDM Rules/Calculators/A&P MDM Number of Diagnoses or Management Options Symptomatic bradycardia: new, needed workup   Amount and/or Complexity of Data Reviewed Clinical lab tests: ordered and reviewed Tests in the radiology section of CPT: ordered and reviewed Tests in the medicine section of CPT: reviewed and ordered Decide to obtain previous medical records or to obtain history from someone other than the patient: yes Obtain history from someone other than the patient: yes Review and summarize past medical records: yes Discuss the patient with other providers: yes Independent visualization of images, tracings, or  specimens: yes  Risk of Complications, Morbidity, and/or Mortality Presenting problems: high Diagnostic procedures: high Management options: high  Critical Care Total time providing critical care: < 30 minutes  Patient Progress Patient progress: stable   Final Clinical Impression(s) / ED Diagnoses Final diagnoses:  Symptomatic bradycardia     Carmin Muskrat, MD 07/05/21 [phone]

## 2021-07-04 NOTE — Consult Note (Signed)
Cardiology Consult:   Patient ID: Gilbert Wilson MRN: [id number]; DOB: 12-Jun-1946   Admission date: 07/04/2021  PCP:  [facility], [facility] Providers Cardiologist:  None      Chief Complaint: symptomatic bradycardia   Patient Profile:   Gilbert Wilson is a 75 y.o. 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  History of Present Illness:   Gilbert Wilson was seen by Nephrology in May 2022, HR 70 at that time.   About 2 weeks ago, pt had orthostatic near-syncope, associated w/ incontinence of bowel/bladder. After that, they did not try to get him up to the chair until 2 days ago.   They sat him up in the chair for about 45" and his eyes became glazed and he was less responsive.  They put him back in bed and his level of consciousness improved.  No seizure activity was witnessed.  She called his PCP, who recommended ER eval.  In the ER, HR 30s w/ high-grade heart block, Cards asked to see.   Gilbert Wilson checks her husband's blood pressure occasionally.  She has noted heart rate readings in the 40s and 50s at times.  However, when he went to the doctor or she checked his blood pressure right after he had been doing something, his heart rate was always normal. Today, when she checked his blood pressure, his pressure was okay, but his heart rate was in the 30s.  She has never seen his heart rate this low before.  Gilbert Wilson responds to loud verbal stimulus.  Once he can hear you, he denies chest pain, shortness of breath, palpitations, being lightheaded or dizzy.  He does not appear acutely uncomfortable and  denies any pain.   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 Authorizing Provider  calcitRIOL (ROCALTROL) 0.25 MCG capsule Take 0.25 mcg by mouth daily.   Yes [provider]  cinacalcet (SENSIPAR) 30 MG tablet Take 30 mg by mouth 2 (two) times daily with a meal.   Yes [provider]  clopidogrel (PLAVIX) 75 MG tablet Take 75 mg by mouth daily.   Yes [provider]  cycloSPORINE (SANDIMMUNE) 100 MG capsule Take 100 mg by mouth 2 (two) times daily.   Yes [provider]  Fe Bisgly-Succ-C-Thre-B12-FA (IRON-150 PO) Take 1 tablet by mouth daily.   Yes [provider]  finasteride (PROSCAR) 5 MG tablet Take 5 mg by mouth every evening.  Yes [provider]  furosemide (LASIX) 20 MG tablet Take 20 mg by mouth daily.   Yes [provider]  insulin glargine (LANTUS SOLOSTAR) 100 UNIT/ML Solostar Pen Inject 6 Units into the skin at bedtime. 130-Take 6 units,   Yes [provider]  levETIRAcetam (KEPPRA) 500 MG tablet Take 500 mg by mouth 2 (two) times daily.   Yes [provider]  losartan (COZAAR) 50 MG tablet Take 50 mg by mouth daily.   Yes [provider]  magnesium oxide (MAG-OX) 400 MG tablet Take 400 mg by mouth daily.   Yes [provider]  mirtazapine (REMERON) 15 MG tablet Take 15 mg by mouth at bedtime.   Yes  [provider]  mycophenolate (MYFORTIC) 180 MG EC tablet Take 180 mg by mouth 2 (two) times daily.   Yes [provider]  NIFEdipine (ADALAT CC) 60 MG 24 hr tablet Take 60 mg by mouth in the morning and at bedtime.   Yes [provider]  rosuvastatin (CRESTOR) 10 MG tablet Take 10 mg by mouth every evening.   Yes [provider]  sodium bicarbonate 650 MG tablet Take 1,300 mg by mouth 2 (two) times daily.   Yes [provider]     Allergies:    Allergies  Allergen Reactions   Levaquin [Levofloxacin] Itching   Linezolid     Seizures   Lisinopril Cough    Social History:   Social History   Socioeconomic History   Marital status: Married    Spouse name: Not on file   Number of children: Not on file   Years of education: Not on file   Highest education level: Not on file  Occupational History   Not on file  Tobacco Use   Smoking status: Former    Types: Cigarettes    Quit date: 09/02/1983    Years since quitting: 37.8   Smokeless tobacco: Never  Substance and Sexual Activity   Alcohol use: Not on file   Drug use: Not on file   Sexual activity: Not on file  Other Topics Concern   Not on file  Social History Narrative   Not on file   Social Determinants of Health   Financial Resource Strain: Not on file  Food Insecurity: Not on file  Transportation Needs: Not on file  Physical Activity: Not on file  Stress: Not on file  Social Connections: Not on file  Intimate Partner Violence: Not on file    Family History:   The patient's family history includes Diabetes in his father and mother; Hypertension in his father.    ROS:  Please see the history of present illness. All other ROS reviewed and negative.     Physical Exam/Data:   Vitals:   07/04/21 1430 07/04/21 1445 07/04/21 1515 07/04/21 1530  BP: (!) 155/68 (!) 164/62 (!) 143/117 130/68  Pulse: (!) 34 (!) 27 (!) 124 (!) 35  Resp: 18 16 (!) 21 18  Temp:      TempSrc:       SpO2: 98% 99% 99% 99%   No intake or output data in the 24 hours ending 07/04/21 1736 No flowsheet data found.   There is no height or weight on file to calculate BMI.  General:  Well nourished, well developed, elderly male, in no acute distress HEENT: normal Neck: mild  JVD Vascular: No carotid bruits; Distal pulses 2+ bilaterally, has AV fistula R forearm w/ palpable thrill  Cardiac:  normal  S1, S2; RRR; no murmur  Lungs:  clear to auscultation bilaterally, no wheezing, rhonchi or rales  Abd: soft, nontender, no hepatomegaly  Ext: no edema Musculoskeletal:  No deformities, BUE and BLE strength weak, R>L Skin: warm and dry  Neuro:  CNs 2-12 intact, no focal abnormalities noted, speech is generally responsive, answers brief, alert and oriented x 2 Psych:  Normal affect    EKG:  The ECG that was done today was personally reviewed and demonstrates Mobitz I 2nd degree AV block, HR 67  Telemetry: Mobitz I & II, w/ HR generally 30s  Relevant CV Studies:  None in our system  Laboratory Data:  High Sensitivity Troponin:  No results for input(s): TROPONINIHS in the last 720 hours.    Chemistry Recent Labs  Lab 07/04/21 1209  NA 141  K 4.1  CL 111  CO2 21*  GLUCOSE 101*  BUN 30*  CREATININE 1.93*  CALCIUM 8.8*  MG 1.8  GFRNONAA 36*  ANIONGAP 9     Recent Labs  Lab 07/04/21 1209  PROT 7.0  ALBUMIN 3.3*  AST 11*  ALT 10  ALKPHOS 87  BILITOT 0.8    Lipids No results for input(s): CHOL, TRIG, HDL, LABVLDL, LDLCALC, CHOLHDL in the last 168 hours. Hematology Recent Labs  Lab 07/04/21 1209  WBC 4.4  RBC 4.85  HGB 11.5*  HCT 37.6*  MCV 77.5*  MCH 23.7*  MCHC 30.6  RDW 15.3  PLT 233    Thyroid pending BNPNo results for input(s): BNP, PROBNP in the last 168 hours.  DDimer No results for input(s): DDIMER in the last 168 hours.   Radiology/Studies:  CT HEAD WO CONTRAST  Result Date: 07/04/2021 CLINICAL DATA:  Altered mental status of unknown cause.  EXAM: CT HEAD WITHOUT CONTRAST TECHNIQUE: Contiguous axial images were obtained from the base of the skull through the vertex without intravenous contrast. COMPARISON:  None. FINDINGS: Brain: There is generalized brain atrophy. There are extensive chronic appearing small vessel ischemic changes throughout the cerebral hemispheric white matter. Chronic atrophic changes of brainstem. Old small vessel infarctions within the left thalamus. No sign of acute infarction, mass lesion, hemorrhage, hydrocephalus or extra-axial collection. Vascular: There is atherosclerotic calcification of the major vessels at the base of the brain. Skull: Negative Sinuses/Orbits: Clear/normal Other: None IMPRESSION: No acute or reversible finding. Cerebral Atrophy (ICD10-G31.9). Extensive chronic small-vessel ischemic changes throughout the brain as outlined above. Electronically Signed   By: Nelson Chimes M.D.   On: 07/04/2021 13:29   DG Chest Port 1 View  Result Date: 07/04/2021 CLINICAL DATA:  Altered level of consciousness. EXAM: PORTABLE CHEST 1 VIEW COMPARISON:  None. FINDINGS: Decreased lung volumes.  Minimal atelectasis in the bases. Heart size upper normal. Negative for heart failure. Small left pleural effusion or pleural thickening. Atherosclerotic aortic arch. IMPRESSION: Hypoventilation with decreased lung volume and mild bibasilar atelectasis Small left pleural effusion or pleural thickening. Electronically Signed   By: Franchot Gallo M.D.   On: 07/04/2021 12:51     Assessment and Plan:   Symptomatic bradycardia -He is on no rate lowering medications - He has had an episode of near syncope that was associated with bowel and bladder incontinence.  This is felt secondary to bradycardia although vital signs were not taken at that time and the family did not seek help. -With a heart rate in the 30s, he is felt to be unstable enough to require admission and evaluation by EP in a.m. for possible pacemaker. -EP is  aware,  he will be placed on the board and orders written  2. Hx combination kidney/liver transplant, HTN, CVA, seizures and other medical issues. -Currently he is medically stable -Management of these and other medical issues per IM    Risk Assessment/Risk Scores:      For questions or updates, please contact [facility] Please consult [email or web address] for contact info under     Signed, Rosaria Ferries, PA-C  07/04/2021 5:36 PM

## 2021-07-04 NOTE — H&P (Addendum)
Error

## 2021-07-04 NOTE — ED Triage Notes (Signed)
Pt here via ems from home with irregular bradycardia and syncope X 2 over the last month. Pt with hx CVA with R sided weakness. Pt HR 30-60 with ems. EMS states 3rd degree heart block noted on 12 lead. BP 110/70.

## 2021-07-04 NOTE — H&P (Signed)
Date: 07/04/2021               Patient Name:  Gilbert Wilson MRN: [id number]  DOB: 26-May-1946 Age / Sex: 75 y.o., male   PCP: [facility]         Medical Service: Internal Medicine Teaching Service         Attending Physician: Dr. Jimmye Norman, Elaina Pattee, MD    First Contact: Scarlett Presto, MD Pager: AD [phone]  Second Contact: Linwood Dibbles, MD Pager: PA [phone]       After Hours (After 5p/  First Contact Pager: [phone]  weekends / holidays): Second Contact Pager: [phone]   SUBJECTIVE   Chief Complaint: Near Syncope  History of Present Illness:  Gilbert Wilson is a 75 y.o. M with a PMH of prior CVA with residual R sided weakness, dementia, SVT s/p ablation, seizures, HTN, T2DM, Kidney and liver transplant, and treated hepatitis C who is presenting after two near syncopal episodes. Patient has dementia so history was provided by the wife. Over the last two weeks the patient has had what his wife describes as episodes of unresponsiveness. The first one two weeks ago happened when he was in his wheelchair. Prior to the episode he told his wife that he thought he wanted to lie down but they encouraged him to stay in his chair for a while longer. All of the sudden they looked back at him and he was slumped with flaccid arms and legs in his wheelchair. They moved him to his bed at that time. Once in his bed he had an episode of bowl/bladder incontinence, though he is also incontinent at baseline so it is unclear whether this was related. During this episode he had a blank stare. The episode lasted for about 20 minutes before he seemed to start regaining his awareness. HR was in the 90s during this episode but his baseline HR is 50-60s. Sometimes his heart rate does drop into the 30s-40s but improves when stimulated. The second episode did not get that bad because once he said he wasn't feeling well she moved him to his bed again. Both episodes lasted for about 45 minutes before he was  completely back to normal. After the second episode they contacted his PCP who recommended he come back to the ED.  He does have a hx of seizure that started after his transplant. He has seizures where he shakes and others where he does not shake but becomes unresponsive. He has had about 6 seizures with the last one in March 2022 after he did not take his Keppra. When asked how these episodes are different from his seizures, the wife said they were different because he was not shaking.   Otherwise he has had normal urine output, no dysuria, no tongue biting, recent falls or trauma, sick contacts, nausea, or vomiting. May have had softer stools than normal.   ED Course:  Meds:  Current Meds  Medication Sig   calcitRIOL (ROCALTROL) 0.25 MCG capsule Take 0.25 mcg by mouth daily.   cinacalcet (SENSIPAR) 30 MG tablet Take 30 mg by mouth 2 (two) times daily with a meal.   clopidogrel (PLAVIX) 75 MG tablet Take 75 mg by mouth daily.   cycloSPORINE (SANDIMMUNE) 100 MG capsule Take 100 mg by mouth 2 (two) times daily.   Fe Bisgly-Succ-C-Thre-B12-FA (IRON-150 PO) Take 1 tablet by mouth daily.   finasteride (PROSCAR) 5 MG tablet Take 5 mg by mouth every evening.   furosemide (LASIX) 20 MG  tablet Take 20 mg by mouth daily.   insulin glargine (LANTUS SOLOSTAR) 100 UNIT/ML Solostar Pen Inject 0-6 Units into the skin at bedtime. If BS>130-Take 6 units, Increase 1 unit for every 10 mg/dl above 130.   levETIRAcetam (KEPPRA) 500 MG tablet Take 500 mg by mouth 2 (two) times daily.   losartan (COZAAR) 50 MG tablet Take 50 mg by mouth daily.   magnesium oxide (MAG-OX) 400 MG tablet Take 400 mg by mouth daily.   mirtazapine (REMERON) 15 MG tablet Take 15 mg by mouth at bedtime.   mycophenolate (MYFORTIC) 180 MG EC tablet Take 180 mg by mouth 2 (two) times daily.   NIFEdipine (ADALAT CC) 60 MG 24 hr tablet Take 60 mg by mouth in the morning and at bedtime.   rosuvastatin (CRESTOR) 10 MG tablet Take 10 mg by mouth  every evening.   sodium bicarbonate 650 MG tablet Take 1,300 mg by mouth 2 (two) times daily.    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 Wife helps with most ADLs and all iADLs though he is able to feed himself. Lives with his wife and their two adult children  Family History:  Family History  Problem Relation Age of Onset   Diabetes Mother    Hypertension Father    Diabetes Father     Brothers had kidney disease  Allergies: Allergies as of 07/04/2021 - Review Complete 07/04/2021  Allergen Reaction Noted   Levaquin [levofloxacin] Itching 07/04/2021   Linezolid  07/04/2021   Lisinopril Cough 07/04/2021    Review of Systems: A complete ROS was negative except as per HPI.   OBJECTIVE:   Physical Exam: Blood pressure 130/68, pulse (!) 35, temperature 98.4 F (36.9 C), temperature source Oral, resp. rate 18, SpO2 99 %.  Constitutional: Elderly man resting in bed, in no acute distress HENT: normocephalic atraumatic, mucous membranes moist Eyes: conjunctiva non-erythematous Neck: supple Cardiovascular: regularly irregular bradycardic rhythm, no m/r/g Pulmonary/Chest: normal work of breathing on room air, lungs clear to auscultation bilaterally Abdominal: soft, non-tender, non-distended MSK: normal bulk and tone, diminished identifiable DP PT pulses using doppler, cool feet Neurological: alert  oriented to self, year, and place, 3/5 strength in RUE and RLE, 5/5 in LUE, 4/5 in LLE.  Skin: warm and dry, normal skin turgor Psych: pleasant affect  Labs: CBC    Component Value Date/Time   WBC 4.4 07/04/2021 1209   RBC 4.85 07/04/2021 1209   HGB 11.5 (L) 07/04/2021 1209   HCT 37.6 (L) 07/04/2021 1209   PLT 233 07/04/2021 1209   MCV 77.5 (L) 07/04/2021 1209   MCH 23.7 (L) 07/04/2021 1209   MCHC 30.6 07/04/2021 1209   RDW 15.3 07/04/2021 1209   LYMPHSABS 1.3 07/04/2021 1209   MONOABS 0.4 07/04/2021 1209  EOSABS 0.1 07/04/2021 1209   BASOSABS 0.1 07/04/2021 1209     CMP     Component Value Date/Time   NA 141 07/04/2021 1209   K 4.1 07/04/2021 1209   CL 111 07/04/2021 1209   CO2 21 (L) 07/04/2021 1209   GLUCOSE 101 (H) 07/04/2021 1209   BUN 30 (H) 07/04/2021 1209   CREATININE 1.93 (H) 07/04/2021 1209   CALCIUM 8.8 (L) 07/04/2021 1209   PROT 7.0 07/04/2021 1209   ALBUMIN 3.3 (L) 07/04/2021 1209   AST 11 (L) 07/04/2021 1209   ALT 10 07/04/2021 1209   ALKPHOS 87 07/04/2021 1209   BILITOT 0.8 07/04/2021 1209   GFRNONAA 36 (L) 07/04/2021 1209    Imaging: CT HEAD WO CONTRAST  Result Date: 07/04/2021 CLINICAL DATA:  Altered mental status of unknown cause. EXAM: CT HEAD WITHOUT CONTRAST TECHNIQUE: Contiguous axial images were obtained from the base of the skull through the vertex without intravenous contrast. COMPARISON:  None. FINDINGS: Brain: There is generalized brain atrophy. There are extensive chronic appearing small vessel ischemic changes throughout the cerebral hemispheric white matter. Chronic atrophic changes of brainstem. Old small vessel infarctions within the left thalamus. No sign of acute infarction, mass lesion, hemorrhage, hydrocephalus or extra-axial collection. Vascular: There is atherosclerotic calcification of the major vessels at the base of the brain. Skull: Negative Sinuses/Orbits: Clear/normal Other: None IMPRESSION: No acute or reversible finding.  Cerebral Atrophy (ICD10-G31.9). Extensive chronic small-vessel ischemic changes throughout the brain as outlined above. Electronically Signed   By: Nelson Chimes M.D.   On: 07/04/2021 13:29   DG Chest Port 1 View  Result Date: 07/04/2021 CLINICAL DATA:  Altered level of consciousness. EXAM: PORTABLE CHEST 1 VIEW COMPARISON:  None. FINDINGS: Decreased lung volumes.  Minimal atelectasis in the bases. Heart size upper normal. Negative for heart failure. Small left pleural effusion or pleural thickening. Atherosclerotic aortic arch. IMPRESSION: Hypoventilation with decreased lung volume and mild bibasilar atelectasis Small left pleural effusion or pleural thickening. Electronically Signed   By: Franchot Gallo M.D.   On: 07/04/2021 12:51    EKG: personally reviewed my interpretation is Type 1 AV block   ASSESSMENT & PLAN:    Assessment & Plan by Problem: Active Problems:   Symptomatic bradycardia   Gilbert Wilson is a 75 y.o. M with a PMH of prior CVA with residual R sided weakness, dementia, SVT s/p ablation, seizures, HTN, T2DM, Kidney and liver transplant, and treated hepatitis C who is presenting after two near syncopal episodes admitted for symptomatic brady cardia  #Symptomatic Bradycardia #Near Syncope Patient's episodes of near syncope mostly likely due to bradycardia. Heart rate in the ED has dropped as low as the 20s. Without witnessing these episodes cannot totally rule out seizure or other cause. Benign CMP, low suspicion for infection, and patient takes insulin at night and ate breakfast prior to the episode of near syncope. Favor cardiac origin of symptoms. Cardiology has been consulted and is on board, they are tentatively planning on for pacemaker. Patient has been able to maintain his BP despite heart rate in the 30s-40s. - cardiology following appreciate assitance - echo pending - Ctm blood pressure - f/u TSH - telemetry  #Seizure Disorder on keppra Takes Keppra 500 BID, do  not suspect seizure at this time. - continue keppra 500 BID  #History of liver and kidney transplant #Hx of hepatitis C Hepatitis C previously treated, on chronic immunosuppression - continue cyclosporine 100 BID - continue mycophenolate 180 BID   #CKD  Creatinine baseline appears to be 1.8-1.9. - avoid nephrotoxic medications - continue bicarb 1300, calcitriol 0.25, and sensipar 30  #T2DM -SSI -f/u A1c  #HTN -continue losartan 50, nivedipine 60  #Dementia -continue mirtazapine 15 QHS  #BPH -continue finasteride  #Hx Prior CVA - continue plavix 75 rosuvastatin 10  #Anemia Hemoglobin 11 on admission - transfuse <7  Diet: Heart Healthy VTE: Enoxaparin IVF: None,None Code: Full  Prior to Admission Living Arrangement: Home, living with family Anticipated Discharge Location: Home Barriers to Discharge: Heart rate stabilization  Dispo: Admit patient to Inpatient with expected length of stay greater than 2 midnights.  Signed: Scarlett Presto, MD Internal Medicine Resident PGY-1 Pager: [phone]  07/04/2021, 6:31 PM

## 2021-07-04 NOTE — Hospital Course (Addendum)
11/5- did tilt table/orthostatics. BP 195/72 , 156/72 HR 60-70 in room. Stopped nifedipine. Added hydralazine 25 mg BID, had not received morning meds including hydralazine on exam. Heart monitor in 01/2021 with no worrisome results per pt's wife at [facility]. No dizzy, HA, SOB, CP. Cardiology suspects main culprit is orthostatic hypotension, signed off. No BM since admission. Usually once a day BM and were loose prior to admission.   Orthostatic presyncope 2 weeks ago while in wheelchair. He was confused afterwards but came around after he was lied down. Had bladder/bowel incontinence. He had a blank stare. HR was in the 90s. Baseline HR is 50-60s  Couple of days ago, he was looking gassy eyed while slumped on chair.    He has a hx of seizure that started after his transplant. He has seizures where he shakes and others where he does not shake. He has had about 6 seizures with the last one in March 2022 after he did not take his Keppra  This morning, HR dropped to 30-40s. Improve when he is stimulated   BP usually in the 130s-170s. 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 He had hep C from the transplant which has been treated.   Brothers had kidney disease, no hx of aneurysm.

## 2021-07-04 NOTE — ED Notes (Signed)
RN aware of pt low BP

## 2021-07-05 ENCOUNTER — Encounter (HOSPITAL_COMMUNITY): Payer: Self-pay | Admitting: Internal Medicine

## 2021-07-05 ENCOUNTER — Encounter (HOSPITAL_COMMUNITY)
Admission: EM | Disposition: A | Discharge status: Home Health | Payer: Self-pay | Source: Home / Self Care | Attending: Internal Medicine

## 2021-07-05 ENCOUNTER — Inpatient Hospital Stay (HOSPITAL_COMMUNITY): Payer: No Typology Code available for payment source

## 2021-07-05 DIAGNOSIS — R531 Weakness: Secondary | ICD-10-CM

## 2021-07-05 DIAGNOSIS — Z79899 Other long term (current) drug therapy: Secondary | ICD-10-CM

## 2021-07-05 DIAGNOSIS — Z8673 Personal history of transient ischemic attack (TIA), and cerebral infarction without residual deficits: Secondary | ICD-10-CM

## 2021-07-05 DIAGNOSIS — R9431 Abnormal electrocardiogram [ECG] [EKG]: Secondary | ICD-10-CM

## 2021-07-05 DIAGNOSIS — E1122 Type 2 diabetes mellitus with diabetic chronic kidney disease: Secondary | ICD-10-CM

## 2021-07-05 DIAGNOSIS — N400 Enlarged prostate without lower urinary tract symptoms: Secondary | ICD-10-CM

## 2021-07-05 DIAGNOSIS — N2889 Other specified disorders of kidney and ureter: Secondary | ICD-10-CM

## 2021-07-05 DIAGNOSIS — D649 Anemia, unspecified: Secondary | ICD-10-CM

## 2021-07-05 DIAGNOSIS — Z940 Kidney transplant status: Secondary | ICD-10-CM

## 2021-07-05 DIAGNOSIS — D84821 Immunodeficiency due to drugs: Secondary | ICD-10-CM

## 2021-07-05 DIAGNOSIS — Z944 Liver transplant status: Secondary | ICD-10-CM

## 2021-07-05 DIAGNOSIS — Z8619 Personal history of other infectious and parasitic diseases: Secondary | ICD-10-CM

## 2021-07-05 DIAGNOSIS — G40909 Epilepsy, unspecified, not intractable, without status epilepticus: Secondary | ICD-10-CM

## 2021-07-05 DIAGNOSIS — I151 Hypertension secondary to other renal disorders: Secondary | ICD-10-CM

## 2021-07-05 DIAGNOSIS — I129 Hypertensive chronic kidney disease with stage 1 through stage 4 chronic kidney disease, or unspecified chronic kidney disease: Secondary | ICD-10-CM

## 2021-07-05 DIAGNOSIS — F0390 Unspecified dementia without behavioral disturbance: Secondary | ICD-10-CM

## 2021-07-05 LAB — ECHOCARDIOGRAM COMPLETE
AR max vel: 2.87 cm2
AV Peak grad: 8.8 mmHg
Ao pk vel: 1.49 m/s
Area-P 1/2: 4.8 cm2
Calc EF: 56.3 %
Height: 73 in
P 1/2 time: 582 msec
S' Lateral: 3.5 cm
Single Plane A2C EF: 51.2 %
Single Plane A4C EF: 57.4 %
Weight: 3188.73 oz

## 2021-07-05 LAB — BASIC METABOLIC PANEL
Anion gap: 9 (ref 5–15)
BUN: 32 mg/dL — ABNORMAL HIGH (ref 8–23)
CO2: 19 mmol/L — ABNORMAL LOW (ref 22–32)
Calcium: 8.9 mg/dL (ref 8.9–10.3)
Chloride: 112 mmol/L — ABNORMAL HIGH (ref 98–111)
Creatinine, Ser: 2 mg/dL — ABNORMAL HIGH (ref 0.61–1.24)
GFR, Estimated: 34 mL/min — ABNORMAL LOW (ref >60)
Glucose, Bld: 97 mg/dL (ref 70–99)
Potassium: 4.7 mmol/L (ref 3.5–5.1)
Sodium: 140 mmol/L (ref 135–145)

## 2021-07-05 LAB — CBC
HCT: 33.1 % — ABNORMAL LOW (ref 39.0–52.0)
Hemoglobin: 10.4 g/dL — ABNORMAL LOW (ref 13.0–17.0)
MCH: 24.2 pg — ABNORMAL LOW (ref 26.0–34.0)
MCHC: 31.4 g/dL (ref 30.0–36.0)
MCV: 77 fL — ABNORMAL LOW (ref 80.0–100.0)
Platelets: 232 10*3/uL (ref 150–400)
RBC: 4.3 MIL/uL (ref 4.22–5.81)
RDW: 15.6 % — ABNORMAL HIGH (ref 11.5–15.5)
WBC: 5.2 10*3/uL (ref 4.0–10.5)
nRBC: 0 % (ref 0.0–0.2)

## 2021-07-05 LAB — URINALYSIS, ROUTINE W REFLEX MICROSCOPIC
Bilirubin Urine: NEGATIVE
Glucose, UA: NEGATIVE mg/dL
Ketones, ur: 5 mg/dL — AB
Nitrite: NEGATIVE
Protein, ur: 30 mg/dL — AB
Specific Gravity, Urine: 1.011 (ref 1.005–1.030)
WBC, UA: >50 WBC/hpf — ABNORMAL HIGH (ref 0–5)
pH: 7 (ref 5.0–8.0)

## 2021-07-05 LAB — SURGICAL PCR SCREEN
MRSA, PCR: NEGATIVE
Staphylococcus aureus: NEGATIVE

## 2021-07-05 LAB — GLUCOSE, CAPILLARY
Glucose-Capillary: 102 mg/dL — ABNORMAL HIGH (ref 70–99)
Glucose-Capillary: 105 mg/dL — ABNORMAL HIGH (ref 70–99)
Glucose-Capillary: 160 mg/dL — ABNORMAL HIGH (ref 70–99)
Glucose-Capillary: 98 mg/dL (ref 70–99)

## 2021-07-05 SURGERY — PACEMAKER IMPLANT

## 2021-07-05 NOTE — Plan of Care (Signed)

## 2021-07-05 NOTE — Progress Notes (Addendum)
HD#1 Subjective:  Overnight Events: NAEO   Patient resting in bed comfortably. Has no complaints and wanted to go back to sleep.   Objective:  Vital signs in last 24 hours: Vitals:   07/05/21 1000 07/05/21 1101 07/05/21 1204 07/05/21 1227  BP: (!) 167/74 134/84 (!) 142/75 136/76  Pulse: 74 78 81 80  Resp: 19 18 19 19   Temp:  98.2 F (36.8 C)    TempSrc:  Oral    SpO2: 99% 100% 100% 99%  Weight:      Height:       Supplemental O2: Room Air SpO2: 99 %   Physical Exam:  Constitutional: Obese elderly gentleman resting comfortably in bed, in no acute distress HENT: normocephalic atraumatic, mucous membranes moist Eyes: conjunctiva non-erythematous Neck: supple Cardiovascular: regular rate and rhythm, no m/r/g Pulmonary/Chest: normal work of breathing on room air, lungs clear to auscultation bilaterally Abdominal: soft, non-tender, non-distended MSK: normal bulk and tone Neurological: sleepy but wakes appropriately to voice, answers questions appropriately Skin: Cold feet, DP/PT pulses present only on doppler Psych: pleasant affect  Filed Weights   07/04/21 2000  Weight: 90.4 kg    No intake or output data in the 24 hours ending 07/05/21 1334 Net IO Since Admission: No IO data has been entered for this period [07/05/21 1334]  Pertinent Labs: CBC Latest Ref Rng & Units 07/05/2021 07/04/2021 12/06/2020  WBC 4.0 - 10.5 K/uL 5.2 4.4 8.8  Hemoglobin 13.0 - 17.0 g/dL 10.4(L) 11.5(L) 10.6(L)  Hematocrit 39.0 - 52.0 % 33.1(L) 37.6(L) 34.5(L)  Platelets 150 - 400 K/uL 232 233 274    CMP Latest Ref Rng & Units 07/05/2021 07/04/2021 12/06/2020  Glucose 70 - 99 mg/dL 97 101(H) 119(H)  BUN 8 - 23 mg/dL 32(H) 30(H) 32(H)  Creatinine 0.61 - 1.24 mg/dL 2.00(H) 1.93(H) 1.88(H)  Sodium 135 - 145 mmol/L 140 141 143  Potassium 3.5 - 5.1 mmol/L 4.7 4.1 3.5  Chloride 98 - 111 mmol/L 112(H) 111 114(H)  CO2 22 - 32 mmol/L 19(L) 21(L) 20(L)  Calcium 8.9 - 10.3 mg/dL 8.9 8.8(L) 8.6(L)   Total Protein 6.5 - 8.1 g/dL - 7.0 6.7  Total Bilirubin 0.3 - 1.2 mg/dL - 0.8 0.6  Alkaline Phos 38 - 126 U/L - 87 72  AST 15 - 41 U/L - 11(L) 15  ALT 0 - 44 U/L - 10 15    Imaging: ECHOCARDIOGRAM COMPLETE  Result Date: 07/05/2021    ECHOCARDIOGRAM REPORT   Patient Name:   Gilbert Wilson Date of Exam: 07/05/2021 Medical Rec #:  [id number]       Height:       73.0 in Accession #:    [id number]      Weight:       199.3 lb Date of Birth:  1946-02-12       BSA:          2.148 m Patient Age:    75 years        BP:           175/52 mmHg Patient Gender: M               HR:           61 bpm. Exam Location:  Inpatient Procedure: 2D Echo, Cardiac Doppler and Color Doppler Indications:    Abnormal EKG  History:        Patient has no prior history of Echocardiogram examinations.  Arrythmias:Bradycardia.  Sonographer:    Jyl Heinz Referring Phys: [place]  1. Left ventricular ejection fraction, by estimation, is 55 to 60%. Left ventricular ejection fraction by 2D MOD biplane is 56.3 %. The left ventricle has normal function. The left ventricle has no regional wall motion abnormalities. There is moderate asymmetric left ventricular hypertrophy of the basal-septal segment. Indeterminate diastolic filling due to E-A fusion.  2. Right ventricular systolic function is normal. The right ventricular size is normal. There is normal pulmonary artery systolic pressure. The estimated right ventricular systolic pressure is 43.3 mmHg.  3. The mitral valve is degenerative. Trivial mitral valve regurgitation. No evidence of mitral stenosis.  4. The aortic valve is tricuspid. There is moderate calcification of the aortic valve. There is moderate thickening of the aortic valve. Aortic valve regurgitation is mild to moderate. Mild to moderate aortic valve sclerosis/calcification is present, without any evidence of aortic stenosis.  5. The inferior vena cava is normal in size with <50%  respiratory variability, suggesting right atrial pressure of 8 mmHg. FINDINGS  Left Ventricle: Left ventricular ejection fraction, by estimation, is 55 to 60%. Left ventricular ejection fraction by 2D MOD biplane is 56.3 %. The left ventricle has normal function. The left ventricle has no regional wall motion abnormalities. The left ventricular internal cavity size was normal in size. There is moderate asymmetric left ventricular hypertrophy of the basal-septal segment. Indeterminate diastolic filling due to E-A fusion. Right Ventricle: The right ventricular size is normal. No increase in right ventricular wall thickness. Right ventricular systolic function is normal. There is normal pulmonary artery systolic pressure. The tricuspid regurgitant velocity is 2.17 m/s, and  with an assumed right atrial pressure of 8 mmHg, the estimated right ventricular systolic pressure is 29.5 mmHg. Left Atrium: Left atrial size was normal in size. Right Atrium: Right atrial size was normal in size. Pericardium: Trivial pericardial effusion is present. Mitral Valve: The mitral valve is degenerative in appearance. Mild to moderate mitral annular calcification. Trivial mitral valve regurgitation. No evidence of mitral valve stenosis. Tricuspid Valve: The tricuspid valve is grossly normal. Tricuspid valve regurgitation is trivial. No evidence of tricuspid stenosis. Aortic Valve: The aortic valve is tricuspid. There is moderate calcification of the aortic valve. There is moderate thickening of the aortic valve. Aortic valve regurgitation is mild to moderate. Aortic regurgitation PHT measures 582 msec. Mild to moderate aortic valve sclerosis/calcification is present, without any evidence of aortic stenosis. Aortic valve peak gradient measures 8.8 mmHg. Pulmonic Valve: The pulmonic valve was grossly normal. Pulmonic valve regurgitation is trivial. No evidence of pulmonic stenosis. Aorta: The aortic root and ascending aorta are structurally  normal, with no evidence of dilitation. Venous: The inferior vena cava is normal in size with less than 50% respiratory variability, suggesting right atrial pressure of 8 mmHg. IAS/Shunts: The atrial septum is grossly normal.  LEFT VENTRICLE PLAX 2D                        Biplane EF (MOD) LVIDd:         5.00 cm         LV Biplane EF:   Left LVIDs:         3.50 cm                          ventricular LV PW:         1.00 cm  ejection LV IVS:        1.40 cm                          fraction by LVOT diam:     2.20 cm                          2D MOD LV SV:         101                              biplane is LV SV Index:   47                               56.3 %. LVOT Area:     3.80 cm                                Diastology                                LV e' medial:    12.80 cm/s LV Volumes (MOD)               LV E/e' medial:  6.3 LV vol d, MOD    113.0 ml      LV e' lateral:   7.62 cm/s A2C:                           LV E/e' lateral: 10.6 LV vol d, MOD    162.0 ml A4C: LV vol s, MOD    55.2 ml A2C: LV vol s, MOD    69.0 ml A4C: LV SV MOD A2C:   57.8 ml LV SV MOD A4C:   162.0 ml LV SV MOD BP:    80.2 ml RIGHT VENTRICLE             IVC RV Basal diam:  2.10 cm     IVC diam: 1.80 cm RV Mid diam:    2.30 cm RV S prime:     10.40 cm/s TAPSE (M-mode): 2.0 cm LEFT ATRIUM             Index        RIGHT ATRIUM           Index LA diam:        4.40 cm 2.05 cm/m   RA Area:     21.70 cm LA Vol (A2C):   44.4 ml 20.67 ml/m  RA Volume:   52.00 ml  24.20 ml/m LA Vol (A4C):   44.4 ml 20.67 ml/m LA Biplane Vol: 46.5 ml 21.64 ml/m  AORTIC VALVE AV Area (Vmax): 2.87 cm AV Vmax:        148.50 cm/s AV Peak Grad:   8.8 mmHg LVOT Vmax:      112.00 cm/s LVOT Vmean:     77.050 cm/s LVOT VTI:       0.266 m AI PHT:         582 msec  AORTA Ao Root diam: 3.80 cm Ao Asc diam:  3.20 cm MITRAL VALVE  TRICUSPID VALVE MV Area (PHT): 4.80 cm     TR Peak grad:   18.8 mmHg MV Decel Time: 158 msec     TR Vmax:         217.00 cm/s MV E velocity: 80.80 cm/s MV A velocity: 123.00 cm/s  SHUNTS MV E/A ratio:  0.66         Systemic VTI:  0.27 m                             Systemic Diam: 2.20 cm Eleonore Chiquito MD Electronically signed by Eleonore Chiquito MD Signature Date/Time: 07/05/2021/10:30:56 AM    Final     Assessment/Plan:   Active Problems:   Symptomatic bradycardia   Patient Summary: Gilbert Wilson is a 75 y.o. with a PMH of prior CVA with residual R sided weakness, dementia, SVT s/p ablation, seizures, HTN, T2DM, Kidney and liver transplant, and treated hepatitis C who is presenting after two near syncopal episodes admitted for symptomatic brady cardia   #Second degree AV block; symptomatic Cardiology has been following, do not this that pacemaker is indicated at least at this time. Recommending orthostatic vitals. - appreciate cardiology assistance - f/u orthostatic vitals - d/c nifedpiine - Echo showed EF of 55-60%, moderate asymmetrical LVH of basal-septal segment - telemetry   #Seizure Disorder on keppra Takes Keppra 500 BID, do not suspect seizure at this time. - continue keppra 500 BID   #History of liver and kidney transplant #Hx of hepatitis C Hepatitis C previously treated, on chronic immunosuppression - continue cyclosporine 100 BID - continue mycophenolate 180 BID    #CKD Creatinine baseline appears to be 1.8-1.9. - avoid nephrotoxic medications - continue bicarb 1300, calcitriol 0.25, and sensipar 30   #T2DM -SSI - A1c 6.4   #HTN -continue losartan 50,  - stop nifedipine 60   #Dementia -continue mirtazapine 15 QHS   #BPH -continue finasteride   #Hx Prior CVA - continue plavix 75 rosuvastatin 10   #Anemia Hemoglobin 11 on admission - transfuse <7   Diet: Heart Healthy VTE: Enoxaparin IVF: None,None Code: Full   Prior to Admission Living Arrangement: Home, living with family Anticipated Discharge Location: Home Barriers to Discharge: Heart rate  stabilization  Scarlett Presto, MD Internal Medicine Resident PGY-1 Pager [phone] Please contact the on call pager after 5 pm and on weekends at [phone].

## 2021-07-05 NOTE — Discharge Summary (Signed)
Name: Gilbert Wilson MRN: [id number] DOB: 17-Jul-1946 75 y.o. PCP: [facility], [facility]  Date of Admission: 07/04/2021 11:13 AM Date of Discharge: 07/07/2021 Attending Physician: Aldine Contes, MD  Discharge Diagnosis: 1. Symptomatic bradycardia/Type 2 AV block 2. Autonomic dysfunction 3. History of Liver and kidney transplant 4. CKD 5. T2DM 6. HTN 7. Dementia 8. BPH 9. Prior CVA with residual weakness 10. Anemia 11. Seizure disorder   Discharge Medications: Allergies as of 07/07/2021       Reactions   Levaquin [levofloxacin] Itching   Linezolid    Seizures   Lisinopril Cough        Medication List     STOP taking these medications    furosemide 20 MG tablet Commonly known as: LASIX   NIFEdipine 60 MG 24 hr tablet Commonly known as: ADALAT CC       TAKE these medications    calcitRIOL 0.25 MCG capsule Commonly known as: ROCALTROL Take 0.25 mcg by mouth daily.   cinacalcet 30 MG tablet Commonly known as: SENSIPAR Take 30 mg by mouth 2 (two) times daily with a meal.   clopidogrel 75 MG tablet Commonly known as: PLAVIX Take 75 mg by mouth daily.   cycloSPORINE modified 100 MG capsule Commonly known as: NEORAL Take 100 mg by mouth 2 (two) times daily.   finasteride 5 MG tablet Commonly known as: PROSCAR Take 5 mg by mouth every evening.   hydrALAZINE 50 MG tablet Commonly known as: APRESOLINE Take 1 tablet (50 mg total) by mouth 3 (three) times daily.   IRON-150 PO Take 1 tablet by mouth daily.   Lantus SoloStar 100 UNIT/ML Solostar Pen Generic drug: insulin glargine Inject 0-6 Units into the skin at bedtime. If BS>130-Take 6 units, Increase 1 unit for every 10 mg/dl above 130.   levETIRAcetam 500 MG tablet Commonly known as: KEPPRA Take 500 mg by mouth 2 (two) times daily.   losartan 100 MG tablet Commonly known as: COZAAR Take 1 tablet (100 mg total) by mouth daily. Start taking on: July 08, 2021 What changed:   medication strength how much to take   magnesium oxide 400 MG tablet Commonly known as: MAG-OX Take 400 mg by mouth daily.   mirtazapine 15 MG tablet Commonly known as: REMERON Take 15 mg by mouth at bedtime.   mycophenolate 180 MG EC tablet Commonly known as: MYFORTIC Take 180 mg by mouth 2 (two) times daily.   rosuvastatin 10 MG tablet Commonly known as: CRESTOR Take 10 mg by mouth every evening.   sodium bicarbonate 650 MG tablet Take 1,300 mg by mouth 2 (two) times daily.               Durable Medical Equipment  (From admission, onward)           Start     Ordered   07/06/21 1705  For home use only DME Other see comment  Once       Comments: [employer]. Patient has a hospital bed at home.  Question:  Length of Need  Answer:  Lifetime   07/06/21 1707            Disposition and follow-up:   Mr.Gilbert Wilson was discharged from [facility] in Stable condition.  At the hospital follow up visit please address:  1.  Near Syncope: Initially thought to be due to bradycardia but after evaluation by electrophysiology, this was thought to be due to autonomic dysfunction. Please monitor patient's heart rate closely  and ask about further symptoms.  2.  Hypertension: Patient's BP was initially elevated on admission but continued to stay elevated after discontinuing nifedipine.  He was started on hydralazine 50 mg TID and his losartan dose was increased to 100 mg. Please check his blood pressure and monitor his kidney function with a BMP  3.  Labs / imaging needed at time of follow-up: BMP, CBC  4.  Pending labs/ test needing follow-up: None  Follow-up Appointments:  Advised to follow-up with [facility] Course by problem list: 1. Symptomatic Bradycardia Came in with a low heart rate in the 30s-40s that would sometimes drop to the 20s. EKG revealed a second degree AV block, echo showed a normal EF, normal troponins, and head  imaging was unremarkable. Cardiology was consulted who suspected this might be more related to autonomic instability exacerbated by positioning. At the time they did not think that a pacemaker was urgently indicated. Orthostatic vitals were obtained which were within normal limits.  Patient's heart rate improved to the 70s and 80s on day of discharge. He was discharged home on 11/6 to follow-up with his [facility] PCP and established with a [place] cardiologist  2. HTN Patient BP was elevated to SBP in this 160s on admission due to patient unable to take his blood pressure meds prior to admission. Blood pressure increased to 190s/70s after discontinuation of his nifedipine. He was started on hydralazine 50 mg 3 times daily and his losartan was increased from 50 mg daily to 100 mg daily. On day of discharge, blood pressure had improved to the 140s/70s. He is advised to follow-up with his [place] provider for BP monitoring and adjustment of medications.   3.  PAD Patient was found to have nonpalpable dorsalis pedis and posterior tibial pulses on admission. Doppler ultrasound was used which was able to review dopplerable DP/PT pulses. Foot continues to be warm to the touch. Patient will likely need an ABI to further evaluate PAD.  4. CKD Stable.  Avoided nephrotoxic medications, encouraged good PO intake. At discharge creatinine was 1.92 with a GFR of 36. He will need a follow-up BMP for monitoring.  5. T2DM Kept on sliding scale insulin, to resume home meds at d/c  6. Seizure disorder Will continued on his home keppra  7. Dementia Continued home mirtazapine 15 QHS  8. BPH Continued home finasteride   9. Prior CVA with residual weakness Continued home plavix 75 and rosuvastatin 10  10. Anemia Stable, required no transfusions.  11. History of Liver and kidney transplant We continued his home cyclosporine and mycophenolate  Discharge Exam:   BP (!) 142/79   Pulse 86   Temp 98.6 F (37 C)   Resp 18    Ht 6\' 1"  (1.854 m)   Wt 90.8 kg   SpO2 99%   BMI 26.41 kg/m   Constitutional: Elderly man resting in bed, in no acute distress Cardiovascular: Regular rate and rhythm. No m/r/g. No LE edema Pulmonary/Chest: CTAB. No wheezing or rales. Abdominal: soft, non-tender, non-distended MSK: normal bulk and tone, diminished identifiable DP/PT pulses using doppler Neurological: Alert and oriented x3. 3/5 strength in RUE and RLE, 5/5 in LUE and LLE.  Skin: warm and dry, normal skin turgor Psych: Normal mood.  Pleasant affect  Pertinent Labs, Studies, and Procedures:   ECHOCARDIOGRAM COMPLETE  Result Date: 07/05/2021    ECHOCARDIOGRAM REPORT   Patient Name:   KRISTION HOLIFIELD Date of Exam: 07/05/2021 Medical Rec #:  [id number]  Height:       73.0 in Accession #:    [id number]      Weight:       199.3 lb Date of Birth:  1946-02-05       BSA:          2.148 m Patient Age:    76 years        BP:           175/52 mmHg Patient Gender: M               HR:           61 bpm. Exam Location:  Inpatient Procedure: 2D Echo, Cardiac Doppler and Color Doppler Indications:    Abnormal EKG  History:        Patient has no prior history of Echocardiogram examinations.                 Arrythmias:Bradycardia.  Sonographer:    Jyl Heinz Referring Phys: [place]  1. Left ventricular ejection fraction, by estimation, is 55 to 60%. Left ventricular ejection fraction by 2D MOD biplane is 56.3 %. The left ventricle has normal function. The left ventricle has no regional wall motion abnormalities. There is moderate asymmetric left ventricular hypertrophy of the basal-septal segment. Indeterminate diastolic filling due to E-A fusion.  2. Right ventricular systolic function is normal. The right ventricular size is normal. There is normal pulmonary artery systolic pressure. The estimated right ventricular systolic pressure is 51.7 mmHg.  3. The mitral valve is degenerative. Trivial mitral valve regurgitation.  No evidence of mitral stenosis.  4. The aortic valve is tricuspid. There is moderate calcification of the aortic valve. There is moderate thickening of the aortic valve. Aortic valve regurgitation is mild to moderate. Mild to moderate aortic valve sclerosis/calcification is present, without any evidence of aortic stenosis.  5. The inferior vena cava is normal in size with <50% respiratory variability, suggesting right atrial pressure of 8 mmHg. FINDINGS  Left Ventricle: Left ventricular ejection fraction, by estimation, is 55 to 60%. Left ventricular ejection fraction by 2D MOD biplane is 56.3 %. The left ventricle has normal function. The left ventricle has no regional wall motion abnormalities. The left ventricular internal cavity size was normal in size. There is moderate asymmetric left ventricular hypertrophy of the basal-septal segment. Indeterminate diastolic filling due to E-A fusion. Right Ventricle: The right ventricular size is normal. No increase in right ventricular wall thickness. Right ventricular systolic function is normal. There is normal pulmonary artery systolic pressure. The tricuspid regurgitant velocity is 2.17 m/s, and  with an assumed right atrial pressure of 8 mmHg, the estimated right ventricular systolic pressure is 61.6 mmHg. Left Atrium: Left atrial size was normal in size. Right Atrium: Right atrial size was normal in size. Pericardium: Trivial pericardial effusion is present. Mitral Valve: The mitral valve is degenerative in appearance. Mild to moderate mitral annular calcification. Trivial mitral valve regurgitation. No evidence of mitral valve stenosis. Tricuspid Valve: The tricuspid valve is grossly normal. Tricuspid valve regurgitation is trivial. No evidence of tricuspid stenosis. Aortic Valve: The aortic valve is tricuspid. There is moderate calcification of the aortic valve. There is moderate thickening of the aortic valve. Aortic valve regurgitation is mild to moderate. Aortic  regurgitation PHT measures 582 msec. Mild to moderate aortic valve sclerosis/calcification is present, without any evidence of aortic stenosis. Aortic valve peak gradient measures 8.8 mmHg. Pulmonic Valve: The pulmonic valve was grossly normal. Pulmonic  valve regurgitation is trivial. No evidence of pulmonic stenosis. Aorta: The aortic root and ascending aorta are structurally normal, with no evidence of dilitation. Venous: The inferior vena cava is normal in size with less than 50% respiratory variability, suggesting right atrial pressure of 8 mmHg. IAS/Shunts: The atrial septum is grossly normal.  LEFT VENTRICLE PLAX 2D                        Biplane EF (MOD) LVIDd:         5.00 cm         LV Biplane EF:   Left LVIDs:         3.50 cm                          ventricular LV PW:         1.00 cm                          ejection LV IVS:        1.40 cm                          fraction by LVOT diam:     2.20 cm                          2D MOD LV SV:         101                              biplane is LV SV Index:   47                               56.3 %. LVOT Area:     3.80 cm                                Diastology                                LV e' medial:    12.80 cm/s LV Volumes (MOD)               LV E/e' medial:  6.3 LV vol d, MOD    113.0 ml      LV e' lateral:   7.62 cm/s A2C:                           LV E/e' lateral: 10.6 LV vol d, MOD    162.0 ml A4C: LV vol s, MOD    55.2 ml A2C: LV vol s, MOD    69.0 ml A4C: LV SV MOD A2C:   57.8 ml LV SV MOD A4C:   162.0 ml LV SV MOD BP:    80.2 ml RIGHT VENTRICLE             IVC RV Basal diam:  2.10 cm     IVC diam: 1.80 cm RV Mid diam:    2.30 cm RV S prime:     10.40 cm/s TAPSE (M-mode): 2.0 cm LEFT ATRIUM  Index        RIGHT ATRIUM           Index LA diam:        4.40 cm 2.05 cm/m   RA Area:     21.70 cm LA Vol (A2C):   44.4 ml 20.67 ml/m  RA Volume:   52.00 ml  24.20 ml/m LA Vol (A4C):   44.4 ml 20.67 ml/m LA Biplane Vol: 46.5 ml 21.64 ml/m   AORTIC VALVE AV Area (Vmax): 2.87 cm AV Vmax:        148.50 cm/s AV Peak Grad:   8.8 mmHg LVOT Vmax:      112.00 cm/s LVOT Vmean:     77.050 cm/s LVOT VTI:       0.266 m AI PHT:         582 msec  AORTA Ao Root diam: 3.80 cm Ao Asc diam:  3.20 cm MITRAL VALVE                TRICUSPID VALVE MV Area (PHT): 4.80 cm     TR Peak grad:   18.8 mmHg MV Decel Time: 158 msec     TR Vmax:        217.00 cm/s MV E velocity: 80.80 cm/s MV A velocity: 123.00 cm/s  SHUNTS MV E/A ratio:  0.66         Systemic VTI:  0.27 m                             Systemic Diam: 2.20 cm Eleonore Chiquito MD Electronically signed by Eleonore Chiquito MD Signature Date/Time: 07/05/2021/10:30:56 AM    Final    CBC Latest Ref Rng & Units 07/06/2021 07/05/2021 07/04/2021  WBC 4.0 - 10.5 K/uL 6.1 5.2 4.4  Hemoglobin 13.0 - 17.0 g/dL 10.7(L) 10.4(L) 11.5(L)  Hematocrit 39.0 - 52.0 % 33.7(L) 33.1(L) 37.6(L)  Platelets 150 - 400 K/uL 227 232 233   CMP Latest Ref Rng & Units 07/06/2021 07/05/2021 07/04/2021  Glucose 70 - 99 mg/dL 103(H) 97 101(H)  BUN 8 - 23 mg/dL 30(H) 32(H) 30(H)  Creatinine 0.61 - 1.24 mg/dL 1.92(H) 2.00(H) 1.93(H)  Sodium 135 - 145 mmol/L 140 140 141  Potassium 3.5 - 5.1 mmol/L 4.4 4.7 4.1  Chloride 98 - 111 mmol/L 113(H) 112(H) 111  CO2 22 - 32 mmol/L 19(L) 19(L) 21(L)  Calcium 8.9 - 10.3 mg/dL 9.1 8.9 8.8(L)  Total Protein 6.5 - 8.1 g/dL - - 7.0  Total Bilirubin 0.3 - 1.2 mg/dL - - 0.8  Alkaline Phos 38 - 126 U/L - - 87  AST 15 - 41 U/L - - 11(L)  ALT 0 - 44 U/L - - 10    Discharge Instructions: Mr. Mcwhirter,  It was a pleasure taking care of you at [place] were admitted for presyncope and treated for symptomatic bradycardia.  Your heart rate has been stable in the 50s to 80s since we discontinued your nifedipine.  We are discharging you home now that you are doing better.  We have ordered a [facility] lifts to be use for transfer in and out of bed.  Please follow the following instructions.  1) Start taking  hydralazine 50 mg 3 times daily  2) Increase your losartan from 50 mg to 100 mg daily  3) Stop taking your nifedipine and Lasix  4) Schedule a hospital follow-up with [facility] for BP check and  lab work  5) Make sure to establish with the [place] cardiologist   Return to the ED for worsening hypotension, chest pain, shortness of breath or syncope   Take care,  Dr. Linwood Dibbles, MD, MPH  Signed: Lacinda Axon, MD 07/07/2021, 11:08 AM   [phone] Internal Medicine Teaching Service

## 2021-07-05 NOTE — Progress Notes (Signed)
  Discussed with Dr. Caryl Comes.  At this time we do not feel his second degree AV block is clinically relevant, as he has had slow heart rates without symptoms as well.   Would NOT resume nifedipine in the event this is contributory.   No clear indication for pacing at this time.   We will sign off.   Please call with any further questions.   Gilbert Wilson [address] [place], PA-C  07/05/2021 3:50 PM

## 2021-07-05 NOTE — Consult Note (Addendum)
ELECTROPHYSIOLOGY CONSULT NOTE    Patient ID: Gilbert Wilson MRN: [id number], DOB/AGE: 74-Oct-1947 74 y.o.  Admit date: 07/04/2021 Date of Consult: 07/05/2021  Primary Physician: [facility] Primary Cardiologist: Buford Dresser, MD  Electrophysiologist:  New  Referring Provider: Dr. Harrell Gave  Patient Profile: Gilbert Wilson is a 75 y.o. 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 Harrell Gave.  HPI:  Gilbert Wilson is a 75 y.o. male with complicated medical history as above.   Gilbert Wilson was seen by Nephrology in May 2022, HR 70 at that time.    About 2 weeks ago, pt had orthostatic near-syncope, associated w/ incontinence of bowel/bladder. After that, they did not try to get him up to the chair until 2 days ago.    They sat him up in the chair for about 45" and his eyes became glazed and he was less responsive.  They put him back in bed and his level of consciousness improved.  No seizure activity was witnessed.   She called his PCP, who recommended ER eval.   In the ER, HR 30s w/ high-grade heart block, Cards asked to see.   His wife states over the past several weeks his HRs have been mostly 40-50s, comes up with activity (I.e. mostly normal in MD offices).   Mr. Manus denies complaints currently. He remembers the funny feelings of the episode(s) but can't really describe it. Wife states he kind of "glazes over" but doesn't seem to completely lose consciousness. Denies chest pain. HR has been low at these times. Wife clarifies that his son bears his entire weight during transitions.   1st episode occurred about 2 weeks ago. Was in  [facility], got quiet. Legs and arms went limp. Eyes open "staring off". He would nod slightly as if he heard, but would not answer back. After 2-3 minutes came back around and had large bowel movement and urinary incontinence.(Intermittent at baseline). Normal the rest of the day.  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   07/04/2021   cinacalcet (SENSIPAR) 30 MG tablet Take 30 mg by mouth 2 (two) times daily with a meal.   07/04/2021   clopidogrel (PLAVIX) 75 MG tablet Take 75 mg by mouth daily.   07/04/2021   cycloSPORINE modified (NEORAL) 100 MG capsule Take 100 mg by mouth 2 (two) times daily.   07/03/2021   Fe Bisgly-Succ-C-Thre-B12-FA (IRON-150 PO)  Take 1 tablet by mouth daily.   07/04/2021   finasteride (PROSCAR) 5 MG tablet Take 5 mg by mouth every evening.   07/03/2021   furosemide (LASIX) 20 MG tablet Take 20 mg by mouth daily.   07/03/2021   insulin glargine (LANTUS SOLOSTAR) 100 UNIT/ML Solostar Pen Inject 0-6 Units into the skin at bedtime. If BS>130-Take 6 units, Increase 1 unit for every 10 mg/dl above 130.   07/03/2021   levETIRAcetam (KEPPRA) 500 MG tablet Take 500 mg by mouth 2 (two) times daily.   07/04/2021   losartan (COZAAR) 50 MG tablet Take 50 mg by mouth daily.   07/03/2021   magnesium oxide (MAG-OX) 400 MG tablet Take 400 mg by mouth daily.    07/04/2021   mirtazapine (REMERON) 15 MG tablet Take 15 mg by mouth at bedtime.   07/03/2021   mycophenolate (MYFORTIC) 180 MG EC tablet Take 180 mg by mouth 2 (two) times daily.   07/04/2021   NIFEdipine (ADALAT CC) 60 MG 24 hr tablet Take 60 mg by mouth in the morning and at bedtime.   07/03/2021   rosuvastatin (CRESTOR) 10 MG tablet Take 10 mg by mouth every evening.   07/03/2021   sodium bicarbonate 650 MG tablet Take 1,300 mg by mouth 2 (two) times daily.   07/04/2021    Inpatient Medications:   calcitRIOL  0.25 mcg Oral Daily   cinacalcet  30 mg Oral BID WC   cycloSPORINE modified  100 mg Oral BID   enoxaparin (LOVENOX) injection  30 mg Subcutaneous Q24H   finasteride  5 mg Oral QPM   gentamicin irrigation  80 mg Irrigation On Call   insulin aspart  0-15 Units Subcutaneous TID WC   levETIRAcetam  500 mg Oral BID   losartan  50 mg Oral Daily   magnesium oxide  400 mg Oral Daily   mirtazapine  15 mg Oral QHS   mycophenolate  180 mg Oral BID   rosuvastatin  10 mg Oral QPM   sodium bicarbonate  1,300 mg Oral BID   sodium chloride flush  3 mL Intravenous Q12H    Allergies:  Allergies  Allergen Reactions   Levaquin [Levofloxacin] Itching   Linezolid     Seizures   Lisinopril Cough    Social History   Socioeconomic History   Marital status: Married    Spouse name: Not on file   Number of children: Not on file   Years of education: Not on file   Highest education level: Not on file  Occupational History   Not on file  Tobacco Use   Smoking status: Former    Types: Cigarettes    Quit date: 09/02/1983    Years since quitting: 37.8   Smokeless tobacco: Never  Substance and Sexual Activity   Alcohol use: Not on file   Drug use: Not on file   Sexual activity: Not on file  Other Topics Concern   Not on file  Social History Narrative   Not on file   Social Determinants of Health   Financial Resource Strain: Not on file  Food Insecurity: Not on file  Transportation  Needs: Not on file  Physical Activity: Not on file  Stress: Not on file  Social Connections: Not on file  Intimate Partner Violence: Not on file     Family History  Problem Relation Age of Onset   Diabetes Mother    Hypertension Father    Diabetes Father  Review of Systems: All other systems reviewed and are otherwise negative except as noted above.  Physical Exam: Vitals:   07/04/21 2100 07/04/21 2200 07/04/21 2346 07/05/21 0300  BP:  (!) 175/52 (!) 186/46 (!) 167/60  Pulse:  63 (!) 49 (!) 25  Resp: (P) 20 20 20 14   Temp:   98.2 F (36.8 C) 97.6 F (36.4 C)  TempSrc: (P) Oral  Oral Oral  SpO2:  100% 97% 99%  Weight:      Height:        GEN- The patient is elderly appearing, alert and oriented x 3 today.  HOH HEENT: normocephalic, atraumatic; sclera clear, conjunctiva pink; hearing intact; oropharynx clear; neck supple Lungs- Clear to ausculation bilaterally, normal work of breathing.  No wheezes, rales, rhonchi Heart- Regular rate and rhythm, no murmurs, rubs or gallops GI- soft, non-tender, non-distended, bowel sounds present Extremities- no clubbing, cyanosis, or edema; DP/PT/radial pulses 2+ bilaterally MS- no significant deformity or atrophy Skin- warm and dry, no rash or lesion Psych- flat but appropriate Neuro- R side chronically weak.   Labs:   Lab Results  Component Value Date   WBC 5.2 07/05/2021   HGB 10.4 (L) 07/05/2021   HCT 33.1 (L) 07/05/2021   MCV 77.0 (L) 07/05/2021   PLT 232 07/05/2021    Recent Labs  Lab 07/04/21 1209 07/05/21 0449  NA 141 140  K 4.1 4.7  CL 111 112*  CO2 21* 19*  BUN 30* 32*  CREATININE 1.93* 2.00*  CALCIUM 8.8* 8.9  PROT 7.0  --   BILITOT 0.8  --   ALKPHOS 87  --   ALT 10  --   AST 11*  --   GLUCOSE 101* 97      Radiology/Studies: CT HEAD WO CONTRAST  Result Date: 07/04/2021 CLINICAL DATA:  Altered mental status of unknown cause. EXAM: CT HEAD WITHOUT CONTRAST TECHNIQUE: Contiguous axial images were  obtained from the base of the skull through the vertex without intravenous contrast. COMPARISON:  None. FINDINGS: Brain: There is generalized brain atrophy. There are extensive chronic appearing small vessel ischemic changes throughout the cerebral hemispheric white matter. Chronic atrophic changes of brainstem. Old small vessel infarctions within the left thalamus. No sign of acute infarction, mass lesion, hemorrhage, hydrocephalus or extra-axial collection. Vascular: There is atherosclerotic calcification of the major vessels at the base of the brain. Skull: Negative Sinuses/Orbits: Clear/normal Other: None IMPRESSION: No acute or reversible finding. Cerebral Atrophy (ICD10-G31.9). Extensive chronic small-vessel ischemic changes throughout the brain as outlined above. Electronically Signed   By: Nelson Chimes M.D.   On: 07/04/2021 13:29   DG Chest Port 1 View  Result Date: 07/04/2021 CLINICAL DATA:  Altered level of consciousness. EXAM: PORTABLE CHEST 1 VIEW COMPARISON:  None. FINDINGS: Decreased lung volumes.  Minimal atelectasis in the bases. Heart size upper normal. Negative for heart failure. Small left pleural effusion or pleural thickening. Atherosclerotic aortic arch. IMPRESSION: Hypoventilation with decreased lung volume and mild bibasilar atelectasis Small left pleural effusion or pleural thickening. Electronically Signed   By: Franchot Gallo M.D.   On: 07/04/2021 12:51    EKG: on arrival shows AVWB at 67 bpm, follow up EKG shows both 2:1  and AVWB (personally reviewed)  TELEMETRY: NSR in 50-60s currently, overnight mostly second degree AV block intermittently AVWB and 2:1 (personally reviewed)   Assessment/Plan: 1.  Second degree AV block, symptomatic Pt has demonstrated both AVWB and 2:1 block without P-P prolongation (less likely vagal) on telemetry He  has multiple co-morbidities and decreased R sided strength s/p CVA in the past. It is difficult to tell HOW symptomatic he is from this.   He is not on AV nodal agents. Would avoid these, including coming off of his Nifedipine which has rarely been associated with AV conduction issues.  Echo is pending His story is more consistent with orthostatic hypotension with prolonged sitting due to his overall bedbound status.  At this time, we do not think he has an urgent indication for pacing, nor is it clear that this would help his symptoms.   2. CKD III-IV, s/p renal transplant Cr 1.8-2.0  3. H/o CVA Mostly independent but limited use of R side. Family are caretakers, but can feed himself, dress, limited bathing.   4. H/o Seizures Unclear if involved in his near-syncopal events.   5. Orthostatic Hypotension Exacerbated by being bedbound over the past couple of weeks.  Compression hose (thigh) and abdominal binder could be potentially better  Recommend reverse trendelenburg as tolerated. Will work with staff and providers for best way to measure orthostatics.   6. Goals of Care Would recommend palliative care consultation given his overall poor functional status and on-going issues, if for nothing else the potential for assistance at home.  For questions or updates, please contact [place] Please consult [email or web address] for contact info under Cardiology/STEMI.  Signed, Shirley Friar, PA-C  07/05/2021 8:29 AM  Second-degree AV block type I with intermittent 2: 1 conduction  "Spells "  Seizure disorder  Kidney/liver transplant 2016  CVA with residual right-sided weakness  Immobility   The patient's wife has noted intermittent changes in her heart rate ranging from the 30s to the 70s and occasionally in the high 20s at night.  She has noted however no association of symptoms with this change in heart rate.  It is rare but has been reported particularly people with autonomic issues that nifedipine can aggravate bradycardia.  Whether its true true would related or not is not clear but his heart rhythm  is much better today compared to yesterday.  Nifedipine has a half-life of only 2 hours, but the extended release has a distribution of over 7 hours.  The episodes that she describes are 2 each of which occurred after prolonged sitting.  He is largely bedbound.  With both episodes, his lips were glazed but eyes open.  Weakness which she says he was not unconscious.  I wonder whether these episodes are hypotensive with prolonged orthostatic stress in the setting of being largely bedbound.  The loss of stool and urine is not helpful in distinguishing seizures in her neurological or seizures that are related to hypoperfusion.  It could be that these are in fact seizures although I would have expected, in my ignorance, that there would be increase in body tone as opposed to loss of body tone.  Neurological input might be helpful here.  We had the privilege of seeing the patient at the same time as Dr. Jimmye Norman who raise a question about whether there may be a Lewy body dementia which can be associate with autonomic dysfunction.  For now would anticipate not pacing.  We discussed trying to elucidate the mechanism of the spells by sitting him in a chair and following blood pressure serially.  Mrs. Boorman is a little bit reluctant understandably and is concerned about him being in a chair where he might fall as she would be unable to lift him and whether he might not need to  be bedbound going forward.  We discussed the potential benefits of identifying orthostatic hypotension inviting mitigating therapies

## 2021-07-05 NOTE — Progress Notes (Deleted)
CCMD made this RN aware that patient is back in 2nd degree heart block type 2.

## 2021-07-06 DIAGNOSIS — Z515 Encounter for palliative care: Secondary | ICD-10-CM

## 2021-07-06 DIAGNOSIS — Z7189 Other specified counseling: Secondary | ICD-10-CM

## 2021-07-06 DIAGNOSIS — I1310 Hypertensive heart and chronic kidney disease without heart failure, with stage 1 through stage 4 chronic kidney disease, or unspecified chronic kidney disease: Secondary | ICD-10-CM

## 2021-07-06 LAB — CBC
HCT: 33.7 % — ABNORMAL LOW (ref 39.0–52.0)
Hemoglobin: 10.7 g/dL — ABNORMAL LOW (ref 13.0–17.0)
MCH: 23.9 pg — ABNORMAL LOW (ref 26.0–34.0)
MCHC: 31.8 g/dL (ref 30.0–36.0)
MCV: 75.4 fL — ABNORMAL LOW (ref 80.0–100.0)
Platelets: 227 10*3/uL (ref 150–400)
RBC: 4.47 MIL/uL (ref 4.22–5.81)
RDW: 15.4 % (ref 11.5–15.5)
WBC: 6.1 10*3/uL (ref 4.0–10.5)
nRBC: 0 % (ref 0.0–0.2)

## 2021-07-06 LAB — BASIC METABOLIC PANEL
Anion gap: 8 (ref 5–15)
BUN: 30 mg/dL — ABNORMAL HIGH (ref 8–23)
CO2: 19 mmol/L — ABNORMAL LOW (ref 22–32)
Calcium: 9.1 mg/dL (ref 8.9–10.3)
Chloride: 113 mmol/L — ABNORMAL HIGH (ref 98–111)
Creatinine, Ser: 1.92 mg/dL — ABNORMAL HIGH (ref 0.61–1.24)
GFR, Estimated: 36 mL/min — ABNORMAL LOW (ref >60)
Glucose, Bld: 103 mg/dL — ABNORMAL HIGH (ref 70–99)
Potassium: 4.4 mmol/L (ref 3.5–5.1)
Sodium: 140 mmol/L (ref 135–145)

## 2021-07-06 LAB — GLUCOSE, CAPILLARY
Glucose-Capillary: 101 mg/dL — ABNORMAL HIGH (ref 70–99)
Glucose-Capillary: 151 mg/dL — ABNORMAL HIGH (ref 70–99)
Glucose-Capillary: 155 mg/dL — ABNORMAL HIGH (ref 70–99)
Glucose-Capillary: 101 mg/dL — ABNORMAL HIGH (ref 70–99)

## 2021-07-06 MED ORDER — ENOXAPARIN SODIUM 40 MG/0.4ML IJ SOSY
40.0000 mg | PREFILLED_SYRINGE | INTRAMUSCULAR | Status: DC
  Administered 2021-07-06 – 2021-07-07 (×2): 40 mg via SUBCUTANEOUS
  Filled 2021-07-06 (×2): qty 0.4

## 2021-07-06 MED ORDER — HYDRALAZINE HCL 50 MG PO TABS
50.0000 mg | ORAL_TABLET | Freq: Three times a day (TID) | ORAL | Status: DC
  Administered 2021-07-06 – 2021-07-07 (×4): 50 mg via ORAL
  Filled 2021-07-06 (×4): qty 1

## 2021-07-06 MED ORDER — LOSARTAN POTASSIUM 50 MG PO TABS
100.0000 mg | ORAL_TABLET | Freq: Every day | ORAL | Status: DC
  Administered 2021-07-07: 100 mg via ORAL
  Filled 2021-07-06: qty 2

## 2021-07-06 MED ORDER — HYDRALAZINE HCL 25 MG PO TABS
25.0000 mg | ORAL_TABLET | Freq: Three times a day (TID) | ORAL | Status: DC
  Administered 2021-07-06: 25 mg via ORAL
  Filled 2021-07-06: qty 1

## 2021-07-06 NOTE — Evaluation (Signed)
Physical Therapy Evaluation Patient Details Name: Gilbert Wilson MRN: [id number] DOB: 27-Oct-1945 Today's Date: 07/06/2021  History of Present Illness  Pt is a 75 y.o. male admitted 11/3 with symptomatic bradycardia. He presented to the ED with 2 near syncopal episodes. PMH: prior CVA with residual R sided weakness, dementia, SVT s/p ablation, seizures, HTN, T2DM, Kidney and liver transplant, and treated hepatitis C.   Clinical Impression  Pt admitted with above diagnosis. PTA pt lived at home with family. Per wife, pt primarily bedbound. Son assists with SPT when pt does transfer to chair/wheelchair. Pt dependent for bathing (bed baths) and dressing. Pt is incontinent. On eval, pt required mod assist rolling, max assist supine <> sit, and min/mod assist to maintain sitting balance EOB x 7-10 minutes. BP increased with transition from supine to sit. HR 68-70 at beginning of session but trended down to upper 40s/lower 50s. Fatigue noted upon return to bed. Pt currently with functional limitations due to the deficits listed below (see PT Problem List). Pt will benefit from skilled PT to increase their independence and safety with mobility to allow discharge to the venue listed below.          Recommendations for follow up therapy are one component of a multi-disciplinary discharge planning process, led by the attending physician.  Recommendations may be updated based on patient status, additional functional criteria and insurance authorization.  Follow Up Recommendations Home health PT (if wife in agreement)    Assistance Recommended at Discharge Frequent or constant Supervision/Assistance  Functional Status Assessment Patient has had a recent decline in their functional status and/or demonstrates limited ability to make significant improvements in function in a reasonable and predictable amount of time  Equipment Recommendations  Other (comment) (hoyer lift)    Recommendations for Other Services        Precautions / Restrictions Precautions Precautions: Fall;Other (comment) Precaution Comments: watch vitals      Mobility  Bed Mobility Overal bed mobility: Needs Assistance Bed Mobility: Rolling;Supine to Sit;Sit to Supine Rolling: Mod assist   Supine to sit: Max assist;HOB elevated Sit to supine: Max assist   General bed mobility comments: +rail, increased time, cues for sequencing    Transfers                   General transfer comment: unable to transition OOB due to fatigue, bradycardia    Ambulation/Gait             General Gait Details: nonambulatory at baseline  Stairs            Wheelchair Mobility    Modified Rankin (Stroke Patients Only)       Balance Overall balance assessment: Needs assistance Sitting-balance support: Single extremity supported;Feet supported Sitting balance-Leahy Scale: Poor Sitting balance - Comments: min/mod assist to maintain balance EOB                                     Pertinent Vitals/Pain Pain Assessment: Faces Faces Pain Scale: No hurt    Home Living Family/patient expects to be discharged to:: Private residence Living Arrangements: Spouse/significant other;Children Available Help at Discharge: Family;Available 24 hours/day Type of Home: House Home Access: Level entry       Home Layout: One level Home Equipment: Wheelchair - manual;Hospital bed      Prior Function Prior Level of Function : Needs assist       Physical Assist :  Mobility (physical);ADLs (physical) Mobility (physical): Bed mobility;Transfers ADLs (physical): Bathing;Grooming;Dressing;Toileting Mobility Comments: Primarily bedbound. Son assists with SPT to/from wheelchair, but pt does not get up on daily basis. Wife reports pt has not ambulated since 2019. ADLs Comments: Wife/family assist with all ADLs. Bed baths. Incontinent.     Hand Dominance        Extremity/Trunk Assessment   Upper Extremity  Assessment Upper Extremity Assessment: RUE deficits/detail RUE Deficits / Details: residual weakness from previous CVA (2018)    Lower Extremity Assessment Lower Extremity Assessment: RLE deficits/detail RLE Deficits / Details: residual weakness from previous CVA (2018)    Cervical / Trunk Assessment Cervical / Trunk Assessment: Kyphotic  Communication   Communication: No difficulties  Cognition Arousal/Alertness: Awake/alert Behavior During Therapy: Flat affect Overall Cognitive Status: History of cognitive impairments - at baseline                                 General Comments: Alzheimer's dementia. Wife present in room to confirm at baseline. Oriented to self only. Follows simple commands. Recognizes his wife.        General Comments General comments (skin integrity, edema, etc.): BP supine 156/72. BP sitting EOB 180/75. HR at 68-70 at begining of session. Trended down to upper 40s/lower 50s at end of session.    Exercises     Assessment/Plan    PT Assessment Patient needs continued PT services  PT Problem List Decreased strength;Decreased mobility;Decreased activity tolerance;Cardiopulmonary status limiting activity;Decreased balance       PT Treatment Interventions DME instruction;Therapeutic activities;Therapeutic exercise;Patient/family education;Balance training;Functional mobility training    PT Goals (Current goals can be found in the Care Plan section)  Acute Rehab PT Goals Patient Stated Goal: home per wife PT Goal Formulation: Patient unable to participate in goal setting Time For Goal Achievement: 07/20/21 Potential to Achieve Goals: Fair    Frequency Min 2X/week   Barriers to discharge        Co-evaluation               AM-PAC PT "6 Clicks" Mobility  Outcome Measure Help needed turning from your back to your side while in a flat bed without using bedrails?: A Lot Help needed moving from lying on your back to sitting on the  side of a flat bed without using bedrails?: A Lot Help needed moving to and from a bed to a chair (including a wheelchair)?: Total Help needed standing up from a chair using your arms (e.g., wheelchair or bedside chair)?: Total Help needed to walk in hospital room?: Total Help needed climbing 3-5 steps with a railing? : Total 6 Click Score: 8    End of Session   Activity Tolerance: Patient limited by fatigue Patient left: in bed;with call bell/phone within reach;with family/visitor present Nurse Communication: Mobility status PT Visit Diagnosis: Other abnormalities of gait and mobility (R26.89);Muscle weakness (generalized) (M62.81)    Time: 9833-8250 PT Time Calculation (min) (ACUTE ONLY): 31 min   Charges:   PT Evaluation $PT Eval Moderate Complexity: 1 Mod PT Treatments $Therapeutic Activity: 8-22 mins        Lorrin Goodell, PT  Office # [phone] Pager [phone]   Lorriane Shire 07/06/2021, 2:03 PM

## 2021-07-06 NOTE — Consult Note (Signed)
Palliative Medicine Inpatient Consult Note  Consulting Provider: Shirley Friar, PA-C  Reason for consult:    Answer  [facility] Palliative Medicine Consult  Reason for Consult? Mostly bed bound, slow HRs, Orthostatic hypotension. h/o CVA, liver/kidney transplant. Dementia   HPI:  Per intake H&P --> Gilbert Wilson is a 75 y.o. with a PMH of prior CVA with residual R sided weakness, dementia, SVT s/p ablation, seizures, HTN, T2DM, Kidney and liver transplant, and treated hepatitis C who is presenting after two near syncopal episodes admitted for symptomatic bradycardia. Palliative care was consulted by the EP team to begin goals of care conversations in the setting of declined functional state.   Clinical Assessment/Goals of Care:  *Please note that this is a verbal dictation therefore any spelling or grammatical errors are due to the "[place]" system interpretation.  I have reviewed medical records including EPIC notes, labs and imaging, received report from bedside RN, assessed the patient who is resting in bed in NAD.    I met with Clayton Lefort and Jasper Riling to further discuss diagnosis prognosis, GOC, EOL wishes, disposition and options.   I introduced Palliative Medicine as specialized medical care for people living with serious illness. It focuses on providing relief from the symptoms and stress of a serious illness. The goal is to improve quality of life for both the patient and the family.  Gilbert Wilson is from [place], [place] originally. He and his family relocated to [place] in February. He and his wife share three children and two grandchildren. He formerly was an Therapist, music. He use to have a great love of fishing and enjoys watching sports. He is a man of faith and is Pentecostal.   [name] and I reviewed Gilbert Wilson's past medical history. She shares that he has had poor mobility since 2019 after be suffered a stroke he at one  point what somewhat mobile though after this event lost strength on his right side which has left him for the most part bed-bound. His son who lives with them lifts him to the chair during the day and back to his bed during the night.   Preceding this Demontrez has endured both a kidney and liver transplant in 2015. He has suffered from Type 2 DM, HTN, and dementia for "years". [name] shares that after the transplants though she noticed Gilbert Wilson's memory became markedly worse.   We discussed Gilbert Wilson's reason for admission in the setting of his slow heart rate. We reviewed that a PM is no longer a consideration as this was thought to be in the setting of nifedipine. Reviewed the plan to "watch" [place] for another night in the hospital.   Most of Hagop's care in [place] was through the [place], [name] shares that she is still learning how to navigate the healthcare system in [place] and it can present with some challenges.   A detailed discussion was had today regarding advanced directives, Gilbert Wilson made these years ago and his wife plans on brining a copy into the hospital.    Concepts specific to code status, artifical feeding and hydration, continued IV antibiotics and rehospitalization was had.  Provided "Hard Choices for [employer]" booklet. And a MOST form. As of presently, [name] is clear that she would wish for all interventions to save Gilbert Wilson's life though she would not wish for him to be in a prolonged state of unwell if improvements were not likely.   The difference between a aggressive medical intervention  path  and a palliative comfort care path for this patient at this time was had. Gilbert Wilson gets enjoyment out of spending time with his family. He does have enjoyment in life despite his stroke he is still interactive though has delays in processing information.  Discussed the importance of continued conversation with family and their  medical providers regarding overall plan of care and  treatment options, ensuring decisions are within the context of the patients values and GOCs.  Decision Maker: Gilbert Wilson (spouse) [phone]  SUMMARY OF RECOMMENDATIONS   Full Code / Full Scope of Care --> Patient would not want to be in a prolonged state of unwell  Spouse will bring in a copy of Living will  TOC - OP Palliative care, patients spouse is in agreement  Ongoing palliative support  Code Status/Advance Care Planning: FULL CODE   Palliative Prophylaxis:  Oral Care, Mobility  Additional Recommendations (Limitations, Scope, Preferences): Continue current scope of care   Psycho-social/Spiritual:  Desire for further Chaplaincy support: No Additional Recommendations: Education on chronic disease   Prognosis: Unclear  Discharge Planning: Likely home with [facility].   Vitals:   07/06/21 0027 07/06/21 0603  BP: (!) 178/78   Pulse: 69 (!) 50  Resp: (!) 23 20  Temp: 98 F (36.7 C) 97.8 F (36.6 C)  SpO2: 100% 97%    Intake/Output Summary (Last 24 hours) at 07/06/2021 3818 Last data filed at 07/05/2021 2100 Gross per 24 hour  Intake 296 ml  Output 1400 ml  Net -1104 ml   Last Weight  Most recent update: 07/06/2021  6:14 AM    Weight  91.4 kg (201 lb 8 oz)            Gen:  Elderly AA M in NAD HEENT: moist mucous membranes CV: Regular rate and irregular rhythm  PULM: clear to auscultation bilaterally  ABD: soft/nontender  EXT: No edema  Neuro: Alert and oriented x2  PPS: 30%   This conversation/these recommendations were discussed with patient primary care team.  Time In: 1000 Time Out: 1110 Total Time: 70 Greater than 50%  of this time was spent counseling and coordinating care related to the above assessment and plan.  [place] Team Team Cell Phone: [phone] Please utilize secure chat with additional questions, if there is no response within 30 minutes please call the above phone  number  Palliative Medicine Team providers are available by phone from 7am to 7pm daily and can be reached through the team cell phone.  Should this patient require assistance outside of these hours, please call the patient's attending physician.

## 2021-07-06 NOTE — Progress Notes (Signed)
HD#2 Subjective:  Overnight Events: BP elevated to systolic in the 627O overnight  Patient evaluated at the bedside with spouse in the room. States he slept fine. Denies any chest pain, dizziness, headaches or blurry vision.  Objective:  Vital signs in last 24 hours: Vitals:   07/06/21 0603 07/06/21 0759 07/06/21 1031 07/06/21 1157  BP: (!) 195/72 (!) 156/72 (!) 159/75 (!) 171/55  Pulse: (!) 50 67  (!) 52  Resp: 20 18  20   Temp: 97.8 F (36.6 C) 97.9 F (36.6 C)  98.1 F (36.7 C)  TempSrc: Oral Oral  Oral  SpO2: 97% 100%  99%  Weight: 91.4 kg     Height:       Supplemental O2: Room Air SpO2: 99 %   Physical Exam:  Constitutional: Pleasant, well-appearing elderly male.  NAD. Cardiovascular: RRR. No m/r/g.  No LE edema Pulmonary/Chest: CTAB.  No wheezing or rales. Abdominal: Soft.  NT/ND.  Normal bowel sounds. MSK: normal bulk and tone Neurological: Alert and oriented x3.  Normal sensation. Skin: Feet remains cold.  Dopplerable but not palpable DP/PT pulses.  Psych: Normal mood and affect.  Filed Weights   07/04/21 2000 07/06/21 0603  Weight: 90.4 kg 91.4 kg     Intake/Output Summary (Last 24 hours) at 07/06/2021 1252 Last data filed at 07/06/2021 0900 Gross per 24 hour  Intake 776 ml  Output 1400 ml  Net -624 ml   Net IO Since Admission: -624 mL [07/06/21 1252]  Pertinent Labs: CBC Latest Ref Rng & Units 07/06/2021 07/05/2021 07/04/2021  WBC 4.0 - 10.5 K/uL 6.1 5.2 4.4  Hemoglobin 13.0 - 17.0 g/dL 10.7(L) 10.4(L) 11.5(L)  Hematocrit 39.0 - 52.0 % 33.7(L) 33.1(L) 37.6(L)  Platelets 150 - 400 K/uL 227 232 233    CMP Latest Ref Rng & Units 07/06/2021 07/05/2021 07/04/2021  Glucose 70 - 99 mg/dL 103(H) 97 101(H)  BUN 8 - 23 mg/dL 30(H) 32(H) 30(H)  Creatinine 0.61 - 1.24 mg/dL 1.92(H) 2.00(H) 1.93(H)  Sodium 135 - 145 mmol/L 140 140 141  Potassium 3.5 - 5.1 mmol/L 4.4 4.7 4.1  Chloride 98 - 111 mmol/L 113(H) 112(H) 111  CO2 22 - 32 mmol/L 19(L) 19(L) 21(L)   Calcium 8.9 - 10.3 mg/dL 9.1 8.9 8.8(L)  Total Protein 6.5 - 8.1 g/dL - - 7.0  Total Bilirubin 0.3 - 1.2 mg/dL - - 0.8  Alkaline Phos 38 - 126 U/L - - 87  AST 15 - 41 U/L - - 11(L)  ALT 0 - 44 U/L - - 10    Imaging: No results found.  Assessment/Plan:   Active Problems:   Symptomatic bradycardia   Patient Summary: Gilbert Wilson is a 75 y.o. 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  Repeat echo on 11/4 showed EF of 55-60%, moderate asymmetrical LVH of basal-septal segment. Cardiology does not think patient's symptoms are due to his bradycardia. Thought to be due to autonomic dysfunction causing orthostatic hypotension with prolonged sitting. No indication for pacemaker at the moment.  Heart rate is improved to the 50s to 70s overnight.  BP remains elevated with SBP 160s to 190s.  -- Cardiology following, appreciate assistance -- Continue telemetry -- Will need follow-up with [place] cardiology at discharge --PT/OT eval  and treat  #HTN BP was elevated to SBP in the 160s on admission since patient had not taken his BP medications.  SBP improved to the 140s however patient's SBP increased to 195 overnight after his nifedipine was discontinued.  Plan to start another antihypertensive agent and monitor.  Due to his bradycardia, will need to avoid calcium channel blockers and beta-blockers.  Hydralazine 25 mg TID added this morning however patient's BP remain elevated so we will titrate up as recommended by cardiology. -- Continue losartan 50 mg daily -- Start hydralazine 50 mg 3 times daily -- Plan for discharge tomorrow with improvement in BP   #Seizure Disorder on keppra Takes Keppra 500 BID, do not  suspect seizure at this time.  No seizure episodes during hospitalizations. -- continue keppra 500 BID   #History of liver and kidney transplant #Hx of hepatitis C Hepatitis C previously treated, on chronic immunosuppression - continue cyclosporine 100 BID - continue mycophenolate 180 BID    #CKD Creatinine baseline appears to be 1.8-1.9. - avoid nephrotoxic medications - continue bicarb 1300, calcitriol 0.25, and sensipar 30   #T2DM A1c of 6.4% during admission. CBGs have been stable in the 100s to 150s. --Continue SSI  #Dementia: Continue mirtazapine 15 mg qhs #BPH: Continue finasteride #Hx Prior CVA: Continue Plavix 25 mg and rosuvastatin 10 mg #Anemia: Hemoglobin stable at 10.7   Diet: Heart Healthy VTE: Enoxaparin IVF: None,None Code: Full   Prior to Admission Living Arrangement: Home, living with family Anticipated Discharge Location: Home Barriers to Discharge: Improvement in BP  Lacinda Axon, MD 07/06/2021, 1:49 PM IM Resident, PGY-2 Pager: [phone] Internal [place] 41:10  Please contact the on call pager after 5 pm and on weekends at [phone].

## 2021-07-06 NOTE — Progress Notes (Signed)
Progress Note   Subjective   Doing well today, the patient denies CP or SOB.  No new concerns  Inpatient Medications    Scheduled Meds:  calcitRIOL  0.25 mcg Oral Daily   cinacalcet  30 mg Oral BID WC   cycloSPORINE modified  100 mg Oral BID   enoxaparin (LOVENOX) injection  30 mg Subcutaneous Q24H   finasteride  5 mg Oral QPM   hydrALAZINE  25 mg Oral Q8H   insulin aspart  0-15 Units Subcutaneous TID WC   levETIRAcetam  500 mg Oral BID   losartan  50 mg Oral Daily   magnesium oxide  400 mg Oral Daily   mirtazapine  15 mg Oral QHS   mycophenolate  180 mg Oral BID   rosuvastatin  10 mg Oral QPM   sodium bicarbonate  1,300 mg Oral BID   Continuous Infusions:  PRN Meds: acetaminophen **OR** acetaminophen, ondansetron **OR** ondansetron (ZOFRAN) IV, senna-docusate   Vital Signs    Vitals:   07/05/21 2043 07/06/21 0027 07/06/21 0603 07/06/21 0759  BP: (!) 162/75 (!) 178/78 (!) 195/72 (!) 156/72  Pulse:  69 (!) 50 67  Resp:  (!) 23 20 18   Temp:  98 F (36.7 C) 97.8 F (36.6 C) 97.9 F (36.6 C)  TempSrc:   Oral Oral  SpO2:  100% 97% 100%  Weight:   91.4 kg   Height:        Intake/Output Summary (Last 24 hours) at 07/06/2021 0712 Last data filed at 07/05/2021 2100 Gross per 24 hour  Intake 296 ml  Output 1400 ml  Net -1104 ml   Filed Weights   07/04/21 2000 07/06/21 0603  Weight: 90.4 kg 91.4 kg    Telemetry    Sinus with occasional mobitz I second degree AV block and rare nocturnal 2:1 AV block - Personally Reviewed  Physical Exam   GEN- The patient is elderly appearing, alert   Head- normocephalic, atraumatic Eyes-  Sclera clear, conjunctiva pink Ears- hearing intact Oropharynx- clear Neck- supple, Lungs-  normal work of breathing Heart- Regular rate and rhythm  GI- soft  Extremities- no clubbing, cyanosis, or edema  MS- no significant deformity or atrophy Skin- no rash or lesion Psych- euthymic mood, full affect Neuro- strength and sensation  are intact   Labs    Chemistry Recent Labs  Lab 07/04/21 1209 07/05/21 0449 07/06/21 0516  NA 141 140 140  K 4.1 4.7 4.4  CL 111 112* 113*  CO2 21* 19* 19*  GLUCOSE 101* 97 103*  BUN 30* 32* 30*  CREATININE 1.93* 2.00* 1.92*  CALCIUM 8.8* 8.9 9.1  PROT 7.0  --   --   ALBUMIN 3.3*  --   --   AST 11*  --   --   ALT 10  --   --   ALKPHOS 87  --   --   BILITOT 0.8  --   --   GFRNONAA 36* 34* 36*  ANIONGAP 9 9 8      Hematology Recent Labs  Lab 07/04/21 1209 07/05/21 0507 07/06/21 0516  WBC 4.4 5.2 6.1  RBC 4.85 4.30 4.47  HGB 11.5* 10.4* 10.7*  HCT 37.6* 33.1* 33.7*  MCV 77.5* 77.0* 75.4*  MCH 23.7* 24.2* 23.9*  MCHC 30.6 31.4 31.8  RDW 15.3 15.6* 15.4  PLT 233 232 227     Patient ID  Gilbert Wilson is a 75 y.o. 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 Harrell Gave.  Assessment & Plan    1.  Mobitz I second degree AV block Stable 2:1 AV block is mostly nocturnal Mostly 1:1 conducting during the day I spoke at length with patient and wife this morning.  They are clear that they would like to avoid pacing. There is no urgent indication for pacing. Avoid AV nodal agents in the future  2. 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 Please call with questions.   Thompson Grayer MD, [facility] 07/06/2021 9:02 AM

## 2021-07-06 NOTE — Evaluation (Signed)
Occupational Therapy Evaluation Patient Details Name: Gilbert Wilson MRN: [id number] DOB: 1945/09/29 Today's Date: 07/06/2021   History of Present Illness Pt is a 75 y.o. male admitted 11/3 with symptomatic bradycardia. He presented to the ED with 2 near syncopal episodes. PMH: prior CVA with residual R sided weakness, dementia, SVT s/p ablation, seizures, HTN, T2DM, Kidney and liver transplant, and treated hepatitis C.   Clinical Impression   Patient admitted for the diagnosis above.  PTA he lives with his spouse, who is the primary CG with assist from son and daughter.  Patient is essentially bedbound except for transfers to a regular wheelchair via family.  Majority of the session was spent discussing possible home modifications via the [place].  As PT discussed the patient would benefit from a hoyer lift at home.  In addition, OT discussed VA assessment of his current bed mattress,  shower modifications to accommodate a wheelchair, high back reclining wheelchair with an appropriate cushion.  Also discussed the benefits of a conversion van for home use.  Patient has no skilled acute OT needs, just recommend home with VA contact.        Recommendations for follow up therapy are one component of a multi-disciplinary discharge planning process, led by the attending physician.  Recommendations may be updated based on patient status, additional functional criteria and insurance authorization.   Follow Up Recommendations  No OT follow up    Assistance Recommended at Discharge Frequent or constant Supervision/Assistance  Functional Status Assessment  Patient has not had a recent decline in their functional status  Equipment Recommendations  Other (comment) (VA referral for home modifications/shower, hoyer lift, wheelchair assessment and cushion)    Recommendations for Other Services       Precautions / Restrictions Precautions Precautions: Fall Precaution Comments: watch vitals              ADL either performed or assessed with clinical judgement   ADL Overall ADL's : At baseline                                                                 Pertinent Vitals/Pain Pain Assessment: Faces Faces Pain Scale: No hurt     Hand Dominance Right   Extremity/Trunk Assessment Upper Extremity Assessment Upper Extremity Assessment: Generalized weakness RUE Deficits / Details: residual weakness from previous CVA (2018)   Lower Extremity Assessment Lower Extremity Assessment: Defer to PT evaluation    Cervical / Trunk Assessment Cervical / Trunk Assessment: Kyphotic   Communication Communication Communication: No difficulties   Cognition Arousal/Alertness: Awake/alert Behavior During Therapy: Flat affect Overall Cognitive Status: History of cognitive impairments - at baseline                                      General Comments  VSS   Exercises     Shoulder Instructions      Home Living Family/patient expects to be discharged to:: Private residence Living Arrangements: Spouse/significant other;Children Available Help at Discharge: Family;Available 24 hours/day Type of Home: House Home Access: Level entry     Home Layout: One level     Bathroom Shower/Tub: Occupational psychologist: Standard  Home Equipment: Wheelchair - manual;Hospital bed          Prior Functioning/Environment Prior Level of Function : Needs assist  Cognitive Assist : Mobility (cognitive);ADLs (cognitive)     Physical Assist : Mobility (physical);ADLs (physical) Mobility (physical): Bed mobility;Transfers ADLs (physical): Grooming;Bathing;Dressing;Toileting;IADLs Mobility Comments: Primarily bedbound. Son assists with SPT to/from wheelchair, but pt does not get up on daily basis. Wife reports pt has not ambulated since 2019. ADLs Comments: Wife/family assist with all ADLs. Bed baths. Incontinent.        OT Problem  List: Decreased cognition      OT Treatment/Interventions:      OT Goals(Current goals can be found in the care plan section) Acute Rehab OT Goals Patient Stated Goal: Return home OT Goal Formulation: With family Time For Goal Achievement: 07/13/21 Potential to Achieve Goals: Good  OT Frequency:     Barriers to D/C:  Home modifications          Co-evaluation              AM-PAC OT "6 Clicks" Daily Activity     Outcome Measure Help from another person eating meals?: A Lot Help from another person taking care of personal grooming?: A Lot Help from another person toileting, which includes using toliet, bedpan, or urinal?: Total Help from another person bathing (including washing, rinsing, drying)?: Total Help from another person to put on and taking off regular upper body clothing?: Total Help from another person to put on and taking off regular lower body clothing?: Total 6 Click Score: 8   End of Session    Activity Tolerance: Patient tolerated treatment well Patient left: in bed;with call bell/phone within reach;with family/visitor present  OT Visit Diagnosis: Other symptoms and signs involving cognitive function                Time: 5681-2751 OT Time Calculation (min): 21 min Charges:  OT General Charges $OT Visit: 1 Visit OT Evaluation $OT Eval Moderate Complexity: 1 Mod  07/06/2021  RP, OTR/L  Acute Rehabilitation Services  Office:  [phone]   Metta Clines 07/06/2021, 2:38 PM

## 2021-07-07 DIAGNOSIS — N189 Chronic kidney disease, unspecified: Secondary | ICD-10-CM

## 2021-07-07 DIAGNOSIS — R001 Bradycardia, unspecified: Secondary | ICD-10-CM

## 2021-07-07 LAB — GLUCOSE, CAPILLARY
Glucose-Capillary: 111 mg/dL — ABNORMAL HIGH (ref 70–99)
Glucose-Capillary: 214 mg/dL — ABNORMAL HIGH (ref 70–99)
Glucose-Capillary: 84 mg/dL (ref 70–99)

## 2021-07-07 MED ORDER — HYDRALAZINE HCL 50 MG PO TABS: 50.0000 mg | ORAL_TABLET | Freq: Three times a day (TID) | ORAL | 3 refills | Status: DC

## 2021-07-07 MED ORDER — LOSARTAN POTASSIUM 100 MG PO TABS: 100.0000 mg | ORAL_TABLET | Freq: Every day | ORAL | 1 refills | Status: DC

## 2021-07-07 NOTE — TOC Transition Note (Addendum)
Transition of Care [facility]) - CM/SW Discharge Note   Patient Details  Name: Gilbert Wilson MRN: [id number] Date of Birth: 02-05-1946  Transition of Care [facility]) CM/SW Contact:  Zenon Mayo, RN Phone Number: 07/07/2021, 1:01 PM   Clinical Narrative:    Patient is for dc home today, NCM spoke with wife and patient at bedside, offered choice for HHPT and outpatient palliative services, she chose [place] and [facility].  NCM made referral to [facility] for HHPT and [facility] for palliative services.  [facility] states they will contact patient at home tomorrow.  Tommi Rumps is able to take referral for HHPT .  Soc will begin 24 to 48 hrs post dc.  Wife states she would like to get the hoyer lift thru the [place] but the [facility] services can go thru patient's Medicare.  NCM will fill out urgent items for dc list for [facility] for the hoyer lift.  [facility] has been called for transport.   Final next level of care: [place] Barriers to Discharge: No Barriers Identified   Patient Goals and CMS Choice Patient states their goals for this hospitalization and ongoing recovery are:: return home CMS Medicare.gov Compare Post Acute Care list provided to:: Patient Represenative (must comment) Choice offered to / list presented to : Spouse  Discharge Placement                       Discharge Plan and Services                  DME Agency: NA       HH Arranged: PT HH Agency: [place] Date [place]: 07/07/21 Time [place]: 1301 Representative spoke with at [place]: [place] ([place]) Interventions     Readmission Risk Interventions No flowsheet data found.

## 2021-07-07 NOTE — Progress Notes (Signed)
   Palliative Medicine Inpatient Follow Up Note  Consulting Provider: Shirley Friar, PA-C   Reason for consult:     Answer  [facility] Consult  Reason for Consult? Mostly bed bound, slow HRs, Orthostatic hypotension. h/o CVA, liver/kidney transplant. Dementia    HPI:  Per intake H&P --> Gilbert Wilson is a 75 y.o. with a PMH of prior CVA with residual R sided weakness, dementia, SVT s/p ablation, seizures, HTN, T2DM, Kidney and liver transplant, and treated hepatitis C who is presenting after two near syncopal episodes admitted for symptomatic bradycardia. Palliative care was consulted by the EP team to begin goals of care conversations in the setting of declined functional state.   Today's Discussion (07/07/2021):  *Please note that this is a verbal dictation therefore any spelling or grammatical errors are due to the "[place]" system interpretation.  Chart reviewed.   I met with Gilbert Wilson at bedside this morning. He was in good spirits and asked me to open the blinds. He also asked to be repositioned in the bed which was done. Gilbert Wilson denied any chest pain, shortness of breath, or nausea.  We reviewed that presently the primary medical team is working on his blood pressure management.  There was no family at bedside this morning.   Palliative support provided.   Objective Assessment: Vital Signs Vitals:   07/07/21 0030 07/07/21 0405  BP: (!) 143/73 (!) 142/67  Pulse:  69  Resp: 20 15  Temp:  98 F (36.7 C)  SpO2:  94%    Intake/Output Summary (Last 24 hours) at 07/07/2021 0731 Last data filed at 07/07/2021 0551 Gross per 24 hour  Intake 480 ml  Output 1050 ml  Net -570 ml   Last Weight  Most recent update: 07/07/2021 12:31 AM    Weight  90.8 kg (200 lb 2.8 oz)            Gen:  Elderly AA M in NAD HEENT: moist mucous membranes CV: Regular rate and irregular rhythm  PULM: clear to auscultation  bilaterally  ABD: soft/nontender  EXT: No edema  Neuro: Alert and oriented x2  SUMMARY OF RECOMMENDATIONS   Full Code / Full Scope of Care --> Patient would not want to be in a prolonged state of unwell   Spouse will bring in a copy of Advance Directives today   TOC - OP Palliative care, patients spouse is in agreement  MSW - Patients spouse needs guidance regarding [facility] system in [place]   Incremental Palliative support as goals presently are for full treatment  Time Spent: 25 Greater than 50% of the time was spent in counseling and coordination of care ______________________________________________________________________________________ [facility] Team Team Cell Phone: [phone] Please utilize secure chat with additional questions, if there is no response within 30 minutes please call the above phone number  Palliative Medicine Team providers are available by phone from 7am to 7pm daily and can be reached through the team cell phone.  Should this patient require assistance outside of these hours, please call the patient's attending physician.

## 2021-07-07 NOTE — Progress Notes (Signed)
[facility])  Referral received for outpatient palliative care services.  [facility] will f/u with patient and family in the home.  Thank you, Venia Carbon RN, BSN [facility] Liaison

## 2021-07-19 ENCOUNTER — Telehealth: Payer: Self-pay

## 2021-07-19 NOTE — Telephone Encounter (Signed)
Attempted to contact patient's wife [name] to schedule a Palliative Care consult appointment. No answer left a message to return call.

## 2021-07-22 ENCOUNTER — Telehealth: Payer: Self-pay

## 2021-07-22 NOTE — Telephone Encounter (Signed)
Attempted to contact patient's wife [name] to schedule a Palliative Care consult appointment. No answer left a message to return call. LNW

## 2021-07-23 ENCOUNTER — Telehealth: Payer: Self-pay | Admitting: Student

## 2021-07-23 NOTE — Telephone Encounter (Signed)
Rec'd return call from patient's wife [name], and we discussed the Palliative referral/services and all questions were answered and she was in agreement with scheduling a visit.  I have scheduled an In-home Consult for 07/30/21 @ 12:30 PM.

## 2021-07-30 ENCOUNTER — Other Ambulatory Visit: Payer: Medicare Other

## 2021-07-30 ENCOUNTER — Other Ambulatory Visit: Payer: Self-pay

## 2021-07-30 ENCOUNTER — Other Ambulatory Visit: Payer: Medicare Other | Admitting: Student

## 2021-07-30 VITALS — BP 158/76 | HR 72 | Temp 97.7°F | Resp 16

## 2021-07-30 DIAGNOSIS — Z515 Encounter for palliative care: Secondary | ICD-10-CM

## 2021-07-31 ENCOUNTER — Other Ambulatory Visit: Payer: Self-pay

## 2021-08-01 NOTE — Progress Notes (Signed)
PATIENT NAME: Gilbert Wilson DOB: [birth year] MRN: [id number]  PRIMARY CARE PROVIDER: Clinic, [facility]  RESPONSIBLE PARTY:  Acct ID - Guarantor Home Phone Work Phone Relationship Acct Type  [id number] EDIE, [id number] [phone]  Self P/F     [address], [place], [place] [address]    PLAN OF CARE and INTERVENTIONS:               1.  GOALS OF CARE/ ADVANCE CARE PLANNING:  Remain home under the care of wife and children.  ACP will need to be addressed.               2.  PATIENT/CAREGIVER EDUCATION:  Palliative Care Services               4. PERSONAL EMERGENCY PLAN:  Activate 911 for emergencies.                5.  DISEASE STATUS:  Visit completed with wife and patient.  Mel with Gilbert Wilson leaving at the time of my arrival.   Wife shared that her and spouse have only been in [place] since February after moving here from [place].  Son and daughter live locally and assist as they are able to.  Wife is working to get patient fully established with the [employer] in [place] as he is veteran of the [employer].  We discussed aide and attendance to offer additional support to patient and wife in the home.  Wife noted patient will see Dr. Particia Wilson at the [facility] on Thursday.  She will further discuss this with MD.   Wife also noted a previous discussion with MD regarding a program that patient may qualify for.  This would allow a provider to come to the patient's home rather than transport him routinely to the [place] for appointments.  Wife is hopeful this can be worked out as she notes the difficulty in transferring patient into the car.  Patient with a recent hospital admission for  bradycardia.  No pacemaker needed and heart rate is being controled by medication.    Palliative Care:  Services explained to wife.  She is asking about billing for services.  Advised today's visit is non-billable.  NP visits are billed like a co-pay for PCP/speciality visit.  Patient is covered by Medicare and AARP per wife.  She will  follow up with insurance to see if 100% is covered.     Dementia: Patient found in the bed awake and alert to self.  He is pleasantly confused and able to recall some history of being in the [place] and living in [place].  Wife provides most of history.  Incontinent of bowel and bladder per wife.   No behavioral issues noted.     CVA: Patient with a CVA resulting in right side weakness.  Wheelchair bound.  Recently received a hoyer lift and wife is utilizing this periodically.  Son is assisting with manual transfers when able.  Currently working with therapy for strengthening.  Diabetes:  HGB A1C is 6.4 earlier this month. Blood sugars are being monitored routinely.  Wife noted CBG range is 130-140's in the evenings.  Currently being covered with insulin.   Seizures:  Most recent seizure was in April or May.  Wife noted there were a couple of missed doses of Keppra during that time.  Remains on Keppra as ordered.   HISTORY OF PRESENT ILLNESS:  75 year old male with a history of liver/kidney transplant, CVA, Seizure and DM.  Patient  is being followed by Palliative Care every 4-8 weeks and PRN.   CODE STATUS: Full ADVANCED DIRECTIVES: No MOST FORM: No PPS: 30%   PHYSICAL EXAM:   VITALS: Today's Vitals   08/01/21 0639  BP: (!) 158/76  Pulse: 72  Resp: 16  Temp: 97.7 F (36.5 C)  SpO2: 96%  PainSc: 0-No pain    LUNGS: clear to auscultation  CARDIAC: Cor RRR}  EXTREMITIES: - for edema SKIN: Skin color, texture, turgor normal. No rashes or lesions or normal  NEURO: positive for gait problems and memory problems       Gilbert Burton, RN

## 2021-09-19 ENCOUNTER — Other Ambulatory Visit: Payer: Self-pay | Admitting: Student

## 2021-10-22 DIAGNOSIS — Z515 Encounter for palliative care: Secondary | ICD-10-CM

## 2021-10-22 NOTE — Progress Notes (Signed)
COMMUNITY PALLIATIVE CARE SW NOTE  PATIENT NAME: Gilbert Wilson DOB: 03/06/1946 MRN: [id number]  PRIMARY CARE PROVIDER: [facility]  RESPONSIBLE PARTY: There is no guarantor information entered for this encounter.     Palliative care SW outreached patient to complete telephonic visit. Patient's spouse provided update on medical condition and/or changes. Spouse shares that patient has been maintaining same level of care since last in home visit, with no changes or declines. Patient continues to be MOD-MAX A with all ADL needs.  Spouse shares that patients appetite remains good. No significant weight changes noted.   No medication needs or adjustments noted. [facility] be[facility] followed by [facility], and has an appointment 10/23/21.  Psychosocial assessment: No financial, food insecurities or issues with transportation noted. No other psychosocial needs. No S/S of depression or anxiety noted. Spouse shared that she discussed aid and attendance services with [facility], and has chosen not to receive the services a t this time as she is able to provide care needs for patient.   Spouse denied in home visit at this time.  Palliative care will continue to monitor and assist with long term care planning as needed.   SOCIAL HX:  Social History   Tobacco Use   Smoking status: Former    Types: Cigarettes    Quit date: 09/02/1983    Years since quitting: 38.1   Smokeless tobacco: Never  Substance Use Topics   Alcohol use: Not on file         [place], [place]

## 2021-10-26 ENCOUNTER — Other Ambulatory Visit: Payer: Self-pay | Admitting: Student

## 2021-11-12 ENCOUNTER — Emergency Department (HOSPITAL_COMMUNITY)
Admission: EM | Admit: 2021-11-12 | Discharge: 2021-11-13 | Disposition: A | Discharge status: Home / Self Care | Payer: No Typology Code available for payment source | Attending: Emergency Medicine | Admitting: Emergency Medicine

## 2021-11-12 ENCOUNTER — Other Ambulatory Visit: Payer: Self-pay

## 2021-11-12 ENCOUNTER — Encounter (HOSPITAL_COMMUNITY): Payer: Self-pay

## 2021-11-12 ENCOUNTER — Emergency Department (HOSPITAL_COMMUNITY): Payer: No Typology Code available for payment source

## 2021-11-12 DIAGNOSIS — F0390 Unspecified dementia without behavioral disturbance: Secondary | ICD-10-CM | POA: Diagnosis not present

## 2021-11-12 DIAGNOSIS — Z794 Long term (current) use of insulin: Secondary | ICD-10-CM | POA: Diagnosis not present

## 2021-11-12 DIAGNOSIS — E119 Type 2 diabetes mellitus without complications: Secondary | ICD-10-CM | POA: Diagnosis not present

## 2021-11-12 DIAGNOSIS — B9689 Other specified bacterial agents as the cause of diseases classified elsewhere: Secondary | ICD-10-CM | POA: Diagnosis not present

## 2021-11-12 DIAGNOSIS — Z79899 Other long term (current) drug therapy: Secondary | ICD-10-CM | POA: Insufficient documentation

## 2021-11-12 DIAGNOSIS — R630 Anorexia: Secondary | ICD-10-CM | POA: Insufficient documentation

## 2021-11-12 DIAGNOSIS — R531 Weakness: Secondary | ICD-10-CM | POA: Diagnosis not present

## 2021-11-12 DIAGNOSIS — I10 Essential (primary) hypertension: Secondary | ICD-10-CM | POA: Diagnosis not present

## 2021-11-12 DIAGNOSIS — R410 Disorientation, unspecified: Secondary | ICD-10-CM | POA: Diagnosis not present

## 2021-11-12 DIAGNOSIS — N390 Urinary tract infection, site not specified: Secondary | ICD-10-CM | POA: Diagnosis not present

## 2021-11-12 DIAGNOSIS — R791 Abnormal coagulation profile: Secondary | ICD-10-CM | POA: Diagnosis not present

## 2021-11-12 DIAGNOSIS — R82998 Other abnormal findings in urine: Secondary | ICD-10-CM | POA: Diagnosis present

## 2021-11-12 LAB — URINALYSIS, ROUTINE W REFLEX MICROSCOPIC
Bilirubin Urine: NEGATIVE
Glucose, UA: NEGATIVE mg/dL
Ketones, ur: 20 mg/dL — AB
Nitrite: NEGATIVE
Protein, ur: 100 mg/dL — AB
Specific Gravity, Urine: 1.016 (ref 1.005–1.030)
pH: 8 (ref 5.0–8.0)

## 2021-11-12 LAB — CBC WITH DIFFERENTIAL/PLATELET
Abs Immature Granulocytes: 0.02 10*3/uL (ref 0.00–0.07)
Basophils Absolute: 0.1 10*3/uL (ref 0.0–0.1)
Basophils Relative: 1 %
Eosinophils Absolute: 0.1 10*3/uL (ref 0.0–0.5)
Eosinophils Relative: 2 %
HCT: 35.3 % — ABNORMAL LOW (ref 39.0–52.0)
Hemoglobin: 11 g/dL — ABNORMAL LOW (ref 13.0–17.0)
Immature Granulocytes: 0 %
Lymphocytes Relative: 26 %
Lymphs Abs: 1.6 10*3/uL (ref 0.7–4.0)
MCH: 23.8 pg — ABNORMAL LOW (ref 26.0–34.0)
MCHC: 31.2 g/dL (ref 30.0–36.0)
MCV: 76.4 fL — ABNORMAL LOW (ref 80.0–100.0)
Monocytes Absolute: 0.6 10*3/uL (ref 0.1–1.0)
Monocytes Relative: 10 %
Neutro Abs: 3.8 10*3/uL (ref 1.7–7.7)
Neutrophils Relative %: 61 %
Platelets: 252 10*3/uL (ref 150–400)
RBC: 4.62 MIL/uL (ref 4.22–5.81)
RDW: 15.7 % — ABNORMAL HIGH (ref 11.5–15.5)
WBC: 6.2 10*3/uL (ref 4.0–10.5)
nRBC: 0 % (ref 0.0–0.2)

## 2021-11-12 LAB — COMPREHENSIVE METABOLIC PANEL
ALT: 9 U/L (ref 0–44)
AST: 11 U/L — ABNORMAL LOW (ref 15–41)
Albumin: 3.4 g/dL — ABNORMAL LOW (ref 3.5–5.0)
Alkaline Phosphatase: 65 U/L (ref 38–126)
Anion gap: 13 (ref 5–15)
BUN: 37 mg/dL — ABNORMAL HIGH (ref 8–23)
CO2: 21 mmol/L — ABNORMAL LOW (ref 22–32)
Calcium: 9.8 mg/dL (ref 8.9–10.3)
Chloride: 104 mmol/L (ref 98–111)
Creatinine, Ser: 2.4 mg/dL — ABNORMAL HIGH (ref 0.61–1.24)
GFR, Estimated: 28 mL/min — ABNORMAL LOW (ref >60)
Glucose, Bld: 186 mg/dL — ABNORMAL HIGH (ref 70–99)
Potassium: 3.9 mmol/L (ref 3.5–5.1)
Sodium: 138 mmol/L (ref 135–145)
Total Bilirubin: 1 mg/dL (ref 0.3–1.2)
Total Protein: 7 g/dL (ref 6.5–8.1)

## 2021-11-12 LAB — APTT: aPTT: 34 seconds (ref 24–36)

## 2021-11-12 LAB — PROTIME-INR
INR: 1 (ref 0.8–1.2)
Prothrombin Time: 13.6 seconds (ref 11.4–15.2)

## 2021-11-12 LAB — LACTIC ACID, PLASMA: Lactic Acid, Venous: 1.5 mmol/L (ref 0.5–1.9)

## 2021-11-12 NOTE — ED Triage Notes (Addendum)
Pt with hx of dementia was brought in by wife with c/o "he is not himself, not at his baseline." She reports he has been sleeping more than usual, foggy, listless, lack of appetite. Has hx of recurrent UTIs/sepsis, presents with chronic foley catheter. She is concerned he has another UTI, wants to be tested for sepsis. TMAX 99.4 two days ago. Hx of kidney and liver transplant in 2016. ?

## 2021-11-12 NOTE — ED Provider Triage Note (Signed)
Emergency Medicine Provider Triage Evaluation Note ? ?Gilbert Wilson , a 76 y.o. male  was evaluated in triage.  Pt with dementia and indwelling Foley catheter secondary to BPH who presents with concern for failure to return to his baseline after treatment for recurrent UTIs.  He presents with his wife at the bedside and states he has been sleeping a significant amount, listless, poor p.o. intake.  Recently completed course of antibiotics for UTI.  History of kidney and liver transplant 2016.  History of type 2 diabetes, dementia, hepatitis C, and CVA.  Currently anticoagulated on aspirin and Plavix. ?Review of Systems  ?Positive:  ?Negative:  ?Unable to complete ROS due to patient's underlying dementia. ? ?Physical Exam  ?BP (!) 152/77 (BP Location: Left Arm)   Pulse 85   Temp 98.5 ?F (36.9 ?C) (Oral)   Resp 18   Wt 90.7 kg   SpO2 96%   BMI 26.39 kg/m?  ?Gen:   Awake, no distress   ?Resp:  Normal effort  ?MSK:   Moves extremities without difficulty  ?Other:  RRR no M/R/G.  Lungs CTA B.  Patient neurovascular intact all 4 extremities.  A&O only to person ? ?Medical Decision Making  ?Medically screening exam initiated at 9:16 PM.  Appropriate orders placed.  Hamp Moreland was informed that the remainder of the evaluation will be completed by another provider, this initial triage assessment does not replace that evaluation, and the importance of remaining in the ED until their evaluation is complete. ? ? ?This chart was dictated using voice recognition software, Dragon. Despite the best efforts of this provider to proofread and correct errors, errors may still occur which can change documentation meaning. ? ?  ?Emeline Darling, PA-C ?11/12/21 2126 ? ?

## 2021-11-13 MED ORDER — SODIUM CHLORIDE 0.9 % IV BOLUS
1000.0000 mL | Freq: Once | INTRAVENOUS | Status: AC
  Administered 2021-11-13: 1000 mL via INTRAVENOUS

## 2021-11-13 MED ORDER — FOSFOMYCIN TROMETHAMINE 3 G PO PACK: 3.0000 g | PACK | Freq: Once | ORAL | 0 refills | Status: DC

## 2021-11-13 MED ORDER — SODIUM CHLORIDE 0.9 % IV SOLN
1.0000 g | Freq: Once | INTRAVENOUS | Status: AC
  Administered 2021-11-13: 1 g via INTRAVENOUS
  Filled 2021-11-13: qty 10

## 2021-11-13 MED ORDER — FOSFOMYCIN TROMETHAMINE 3 G PO PACK
3.0000 g | PACK | Freq: Once | ORAL | Status: AC
  Administered 2021-11-13: 3 g via ORAL
  Filled 2021-11-13: qty 3

## 2021-11-13 NOTE — Discharge Instructions (Addendum)
We are treating the urinary tract infection with fosfomycin which will cover Enterococcus, E. coli, Escherichia and Klebsiella. If the urine cultures did not grow any of these bacteria, we will likely call you and or calling a prescription for antibiotics.  In case you are not contacted please check my chart for results.  We will also recommend you follow-up with the primary care provider and show them the results of the urine culture.  They will also be able to determine whether fosfomycin was the appropriate medication for his urinary tract infection. ?

## 2021-11-13 NOTE — ED Notes (Signed)
EDP aware of difficulty obtaining IV and IV team consult placed  ?

## 2021-11-13 NOTE — ED Provider Notes (Signed)
?[facility] ?Provider Note ? ?CSN: [id number] ?Arrival date & time: 11/12/21 2057 ? ?Chief Complaint(s) ?Recurrent UTI ? ?HPI ?Gilbert Wilson is a 76 y.o. male with a past medical history listed below including dementia and recurrent urinary tract infections who was recently treated with Keflex presents for persistent confusion.  The wife believes the UTI has not been fully treated.  Patient has an indwelling Foley due to BPH.  He is followed by [place]. he has been generally weak with decreased oral intake.  No emesis.  No diarrhea.  No other physical complaints. ? ?The history is provided by the spouse.  ? ?Past Medical History ?Past Medical History:  ?Diagnosis Date  ? CVA (cerebral vascular accident) ([facility]) 2019  ? Dementia ([place])   ? 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 treated w/ procedure  ? Hepatitis C   ? HTN (hypertension)   ? Kidney failure   ? Liver failure ([place])   ? Seizure ([facility])   ? Tachycardia requiring ablation 2017  ? Type 2 diabetes mellitus treated without insulin ([facility])   ? ?Patient Active Problem List  ? Diagnosis Date Noted  ? Symptomatic bradycardia 07/04/2021  ? ?Home Medication(s) ?Prior to Admission medications   ?Medication Sig Start Date End Date Taking? Authorizing Provider  ?calcitRIOL (ROCALTROL) 0.25 MCG capsule Take 0.25 mcg by mouth daily.    [provider]  ?cinacalcet (SENSIPAR) 30 MG tablet Take 30 mg by mouth 2 (two) times daily with a meal.    [provider]  ?clopidogrel (PLAVIX) 75 MG tablet Take 75 mg by mouth daily.    [provider]  ?cycloSPORINE modified (NEORAL) 100 MG capsule Take 100 mg by mouth 2 (two) times daily.    [provider]  ?Fe Bisgly-Succ-C-Thre-B12-FA (IRON-150 PO) Take 1 tablet by mouth daily.    [provider]  ?finasteride (PROSCAR) 5 MG tablet Take 5 mg by mouth every evening.    [provider]  ?hydrALAZINE (APRESOLINE) 50 MG tablet Take 1 tablet (50 mg total)  by mouth 3 (three) times daily. 07/07/21   Lacinda Axon, MD  ?insulin glargine (LANTUS SOLOSTAR) 100 UNIT/ML Solostar Pen Inject 0-6 Units into the skin at bedtime. If BS>130-Take 6 units, Increase 1 unit for every 10 mg/dl above 130.    [provider]  ?levETIRAcetam (KEPPRA) 500 MG tablet Take 500 mg by mouth 2 (two) times daily.    [provider]  ?losartan (COZAAR) 100 MG tablet Take 1 tablet (100 mg total) by mouth daily. 07/08/21   Lacinda Axon, MD  ?magnesium oxide (MAG-OX) 400 MG tablet Take 400 mg by mouth daily.    [provider]  ?mirtazapine (REMERON) 15 MG tablet Take 15 mg by mouth at bedtime.    [provider]  ?mycophenolate (MYFORTIC) 180 MG EC tablet Take 180 mg by mouth 2 (two) times daily.    [provider]  ?rosuvastatin (CRESTOR) 10 MG tablet Take 10 mg by mouth every evening.    [provider]  ?sodium bicarbonate 650 MG tablet Take 1,300 mg by mouth 2 (two) times daily.    [provider]  ?                                                                                                                                  ?  Allergies ?Levaquin [levofloxacin], Linezolid, and Lisinopril ? ?Review of Systems ?Review of Systems ?As noted in HPI ? ?Physical Exam ?Vital Signs  ?I have reviewed the triage vital signs ?BP 139/75   Pulse 78   Temp 98.2 ?F (36.8 ?C) (Oral)   Resp 16   Wt 90.7 kg   SpO2 100%   BMI 26.39 kg/m?  ? ?Physical Exam ?Vitals reviewed.  ?Constitutional:   ?   General: He is not in acute distress. ?   Appearance: He is well-developed. He is not diaphoretic.  ?HENT:  ?   Head: Normocephalic and atraumatic.  ?   Right Ear: External ear normal.  ?   Left Ear: External ear normal.  ?   Nose: Nose normal.  ?   Mouth/Throat:  ?   Mouth: Mucous membranes are moist.  ?Eyes:  ?   General: No scleral icterus. ?   Conjunctiva/sclera: Conjunctivae normal.  ?Neck:  ?   Trachea: Phonation normal.   ?Cardiovascular:  ?   Rate and Rhythm: Normal rate and regular rhythm.  ?Pulmonary:  ?   Effort: Pulmonary effort is normal. No respiratory distress.  ?   Breath sounds: No stridor.  ?Abdominal:  ?   General: There is no distension.  ?   Tenderness: There is no abdominal tenderness.  ?Genitourinary: ?   Comments: Indwelling foley ?Musculoskeletal:     ?   General: Normal range of motion.  ?   Cervical back: Normal range of motion.  ?Neurological:  ?   Mental Status: He is alert. He is disoriented.  ?Psychiatric:     ?   Behavior: Behavior normal.  ? ? ?ED Results and Treatments ?Labs ?(all labs ordered are listed, but only abnormal results are displayed) ?Labs Reviewed  ?COMPREHENSIVE METABOLIC PANEL - Abnormal; Notable for the following components:  ?    Result Value  ? CO2 21 (*)   ? Glucose, Bld 186 (*)   ? BUN 37 (*)   ? Creatinine, Ser 2.40 (*)   ? Albumin 3.4 (*)   ? AST 11 (*)   ? GFR, Estimated 28 (*)   ? All other components within normal limits  ?CBC WITH DIFFERENTIAL/PLATELET - Abnormal; Notable for the following components:  ? Hemoglobin 11.0 (*)   ? HCT 35.3 (*)   ? MCV 76.4 (*)   ? MCH 23.8 (*)   ? RDW 15.7 (*)   ? All other components within normal limits  ?URINALYSIS, ROUTINE W REFLEX MICROSCOPIC - Abnormal; Notable for the following components:  ? APPearance CLOUDY (*)   ? Hgb urine dipstick SMALL (*)   ? Ketones, ur 20 (*)   ? Protein, ur 100 (*)   ? Leukocytes,Ua LARGE (*)   ? Bacteria, UA FEW (*)   ? All other components within normal limits  ?CULTURE, BLOOD (ROUTINE X 2)  ?CULTURE, BLOOD (ROUTINE X 2)  ?URINE CULTURE  ?LACTIC ACID, PLASMA  ?PROTIME-INR  ?APTT  ?LACTIC ACID, PLASMA  ?                                                                                                                       ?  EKG ? EKG Interpretation ? ?Date/Time:  Tuesday November 12 2021 21:17:00 EDT ?Ventricular Rate:  84 ?PR Interval:  250 ?QRS Duration: 108 ?QT Interval:  388 ?QTC Calculation: 458 ?R Axis:   -80 ?Text  Interpretation: Sinus rhythm with 1st degree A-V block with Premature atrial complexes with Abberant conduction Left axis deviation Left ventricular hypertrophy ( R in aVL , Cornell product , Romhilt-Estes ) Possible Lateral infarct , age undetermined Abnormal ECG When compared with ECG of 04-Jul-2021 23:02, PREVIOUS ECG IS PRESENT Confirmed by Addison Lank [phone]) on 11/13/2021 6:17:07 AM ?  ? ?  ? ?Radiology ?DG Chest 2 View ? ?Result Date: 11/12/2021 ?CLINICAL DATA:  Sepsis EXAM: CHEST - 2 VIEW COMPARISON:  07/04/2021 FINDINGS: Frontal and lateral views of the chest demonstrate a stable cardiac silhouette. Lung volumes are diminished, without superimposed airspace disease, effusion, or pneumothorax. No acute bony abnormalities. IMPRESSION: 1. Low lung volumes.  No acute airspace disease. Electronically Signed   By: Randa Ngo M.D.   On: 11/12/2021 22:19   ? ?Pertinent labs & imaging results that were available during my care of the patient were reviewed by me and considered in my medical decision making (see MDM for details). ? ?Medications Ordered in ED ?Medications  ?fosfomycin (MONUROL) packet 3 g (has no administration in time range)  ?sodium chloride 0.9 % bolus 1,000 mL (1,000 mLs Intravenous New Bag/Given 11/13/21 0521)  ?cefTRIAXone (ROCEPHIN) 1 g in sodium chloride 0.9 % 100 mL IVPB (0 g Intravenous Stopped 11/13/21 0611)  ?                                                               ?                                                                    ?Procedures ?Procedures ? ?(including critical care time) ? ?Medical Decision Making / ED Course ? ? ? Complexity of Problem: ? ?Co-morbidities/SDOH that complicate the patient evaluation/care: ?As noted above in HPI, in addition to renal and liver transplant on immunosuppressive. ? ?Additional history obtained: ?Wife at bedside ? ?Patient's presenting problem/concern and DDX listed below: ?General malaise ?Given history of recurrent UTIs will assess  for this and other infections.  We will also assess for anemia, renal failure, electrolyte derangements, metabolic derangements. ? ? ?  Complexity of Data: ?  ?Cardiac Monitoring: ?None ? ?Laboratory Tests ordered listed

## 2021-11-15 LAB — URINE CULTURE: Culture: >=100000 — AB

## 2021-11-16 ENCOUNTER — Telehealth: Payer: Self-pay

## 2021-11-16 NOTE — Telephone Encounter (Signed)
Post ED Visit - Positive Culture Follow-up ? ?Culture report reviewed by antimicrobial stewardship pharmacist: ?[facility] Team ?[x]  Bertis Ruddy, Pharm.D. ?[]  Heide Guile, Pharm.D., BCPS AQ-ID ?[]  Parks Neptune, Pharm.D., BCPS ?[]  Alycia Rossetti, Pharm.D., BCPS ?[]  [place], Pharm.D., BCPS, AAHIVP ?[]  Legrand Como, Pharm.D., BCPS, AAHIVP ?[]  Salome Arnt, PharmD, BCPS ?[]  Johnnette Gourd, PharmD, BCPS ?[]  Hughes Better, PharmD, BCPS ?[]  Leeroy Cha, PharmD ?[]  Laqueta Linden, PharmD, BCPS ?[]  Albertina Parr, PharmD ? ?[facility] [place] Team ?[]  Leodis Sias, PharmD ?[]  Lindell Spar, PharmD ?[]  Royetta Asal, PharmD ?[]  Graylin Shiver, Rph ?[]  Rema Fendt) Glennon Mac, PharmD ?[]  Arlyn Dunning, PharmD ?[]  Netta Cedars, PharmD ?[]  Dia Sitter, PharmD ?[]  Leone Haven, PharmD ?[]  Gretta Arab, PharmD ?[]  Theodis Shove, PharmD ?[]  Peggyann Juba, PharmD ?[]  Reuel Boom, PharmD ? ? ?Positive urine culture ?Treated with Fosfomycin Tromethamine, organism sensitive to the same and no further patient follow-up is required at this time. ? ?Glennon Hamilton ?11/16/2021, 12:21 PM ?  ?

## 2021-11-17 LAB — CULTURE, BLOOD (ROUTINE X 2)
Culture: NO GROWTH
Culture: NO GROWTH
Special Requests: ADEQUATE

## 2021-11-24 ENCOUNTER — Encounter (HOSPITAL_COMMUNITY): Payer: Self-pay

## 2021-11-24 ENCOUNTER — Emergency Department (HOSPITAL_COMMUNITY): Payer: No Typology Code available for payment source

## 2021-11-24 ENCOUNTER — Inpatient Hospital Stay (HOSPITAL_COMMUNITY)
Admission: EM | Admit: 2021-11-24 | Discharge: 2021-11-27 | DRG: 065 | Disposition: A | Discharge status: Home Health | Payer: No Typology Code available for payment source | Attending: Internal Medicine | Admitting: Internal Medicine

## 2021-11-24 ENCOUNTER — Other Ambulatory Visit: Payer: Self-pay

## 2021-11-24 DIAGNOSIS — D849 Immunodeficiency, unspecified: Secondary | ICD-10-CM | POA: Insufficient documentation

## 2021-11-24 DIAGNOSIS — I4891 Unspecified atrial fibrillation: Secondary | ICD-10-CM | POA: Diagnosis not present

## 2021-11-24 DIAGNOSIS — D631 Anemia in chronic kidney disease: Secondary | ICD-10-CM

## 2021-11-24 DIAGNOSIS — G40909 Epilepsy, unspecified, not intractable, without status epilepticus: Secondary | ICD-10-CM | POA: Diagnosis present

## 2021-11-24 DIAGNOSIS — R001 Bradycardia, unspecified: Secondary | ICD-10-CM

## 2021-11-24 DIAGNOSIS — I69359 Hemiplegia and hemiparesis following cerebral infarction affecting unspecified side: Secondary | ICD-10-CM | POA: Diagnosis not present

## 2021-11-24 DIAGNOSIS — Z8249 Family history of ischemic heart disease and other diseases of the circulatory system: Secondary | ICD-10-CM

## 2021-11-24 DIAGNOSIS — Z881 Allergy status to other antibiotic agents status: Secondary | ICD-10-CM

## 2021-11-24 DIAGNOSIS — I129 Hypertensive chronic kidney disease with stage 1 through stage 4 chronic kidney disease, or unspecified chronic kidney disease: Secondary | ICD-10-CM | POA: Diagnosis present

## 2021-11-24 DIAGNOSIS — F0390 Unspecified dementia without behavioral disturbance: Secondary | ICD-10-CM

## 2021-11-24 DIAGNOSIS — R29810 Facial weakness: Secondary | ICD-10-CM | POA: Diagnosis present

## 2021-11-24 DIAGNOSIS — E785 Hyperlipidemia, unspecified: Secondary | ICD-10-CM | POA: Diagnosis present

## 2021-11-24 DIAGNOSIS — Z20822 Contact with and (suspected) exposure to covid-19: Secondary | ICD-10-CM | POA: Diagnosis present

## 2021-11-24 DIAGNOSIS — N186 End stage renal disease: Secondary | ICD-10-CM

## 2021-11-24 DIAGNOSIS — Z794 Long term (current) use of insulin: Secondary | ICD-10-CM

## 2021-11-24 DIAGNOSIS — Z944 Liver transplant status: Secondary | ICD-10-CM

## 2021-11-24 DIAGNOSIS — F4310 Post-traumatic stress disorder, unspecified: Secondary | ICD-10-CM | POA: Diagnosis present

## 2021-11-24 DIAGNOSIS — I639 Cerebral infarction, unspecified: Secondary | ICD-10-CM | POA: Diagnosis not present

## 2021-11-24 DIAGNOSIS — E119 Type 2 diabetes mellitus without complications: Secondary | ICD-10-CM

## 2021-11-24 DIAGNOSIS — R569 Unspecified convulsions: Secondary | ICD-10-CM

## 2021-11-24 DIAGNOSIS — I6381 Other cerebral infarction due to occlusion or stenosis of small artery: Principal | ICD-10-CM | POA: Diagnosis present

## 2021-11-24 DIAGNOSIS — N401 Enlarged prostate with lower urinary tract symptoms: Secondary | ICD-10-CM | POA: Diagnosis not present

## 2021-11-24 DIAGNOSIS — E559 Vitamin D deficiency, unspecified: Secondary | ICD-10-CM | POA: Insufficient documentation

## 2021-11-24 DIAGNOSIS — I440 Atrioventricular block, first degree: Secondary | ICD-10-CM | POA: Diagnosis present

## 2021-11-24 DIAGNOSIS — Z888 Allergy status to other drugs, medicaments and biological substances status: Secondary | ICD-10-CM

## 2021-11-24 DIAGNOSIS — Z833 Family history of diabetes mellitus: Secondary | ICD-10-CM

## 2021-11-24 DIAGNOSIS — N1832 Chronic kidney disease, stage 3b: Secondary | ICD-10-CM | POA: Diagnosis present

## 2021-11-24 DIAGNOSIS — I69351 Hemiplegia and hemiparesis following cerebral infarction affecting right dominant side: Secondary | ICD-10-CM

## 2021-11-24 DIAGNOSIS — I10 Essential (primary) hypertension: Secondary | ICD-10-CM | POA: Diagnosis not present

## 2021-11-24 DIAGNOSIS — R1310 Dysphagia, unspecified: Secondary | ICD-10-CM | POA: Insufficient documentation

## 2021-11-24 DIAGNOSIS — D563 Thalassemia minor: Secondary | ICD-10-CM | POA: Insufficient documentation

## 2021-11-24 DIAGNOSIS — Z79899 Other long term (current) drug therapy: Secondary | ICD-10-CM

## 2021-11-24 DIAGNOSIS — Z87440 Personal history of urinary (tract) infections: Secondary | ICD-10-CM

## 2021-11-24 DIAGNOSIS — G464 Cerebellar stroke syndrome: Secondary | ICD-10-CM | POA: Insufficient documentation

## 2021-11-24 DIAGNOSIS — Z940 Kidney transplant status: Secondary | ICD-10-CM

## 2021-11-24 DIAGNOSIS — I120 Hypertensive chronic kidney disease with stage 5 chronic kidney disease or end stage renal disease: Secondary | ICD-10-CM | POA: Insufficient documentation

## 2021-11-24 DIAGNOSIS — Z87891 Personal history of nicotine dependence: Secondary | ICD-10-CM

## 2021-11-24 DIAGNOSIS — G4733 Obstructive sleep apnea (adult) (pediatric): Secondary | ICD-10-CM | POA: Diagnosis present

## 2021-11-24 DIAGNOSIS — D638 Anemia in other chronic diseases classified elsewhere: Secondary | ICD-10-CM

## 2021-11-24 DIAGNOSIS — K7460 Unspecified cirrhosis of liver: Secondary | ICD-10-CM | POA: Insufficient documentation

## 2021-11-24 DIAGNOSIS — G8190 Hemiplegia, unspecified affecting unspecified side: Secondary | ICD-10-CM

## 2021-11-24 DIAGNOSIS — B182 Chronic viral hepatitis C: Secondary | ICD-10-CM | POA: Insufficient documentation

## 2021-11-24 DIAGNOSIS — R8281 Pyuria: Secondary | ICD-10-CM

## 2021-11-24 DIAGNOSIS — I517 Cardiomegaly: Secondary | ICD-10-CM | POA: Insufficient documentation

## 2021-11-24 DIAGNOSIS — D649 Anemia, unspecified: Secondary | ICD-10-CM

## 2021-11-24 DIAGNOSIS — H905 Unspecified sensorineural hearing loss: Secondary | ICD-10-CM | POA: Insufficient documentation

## 2021-11-24 DIAGNOSIS — R471 Dysarthria and anarthria: Secondary | ICD-10-CM | POA: Diagnosis present

## 2021-11-24 DIAGNOSIS — F03A0 Unspecified dementia, mild, without behavioral disturbance, psychotic disturbance, mood disturbance, and anxiety: Secondary | ICD-10-CM | POA: Diagnosis present

## 2021-11-24 DIAGNOSIS — Z7401 Bed confinement status: Secondary | ICD-10-CM

## 2021-11-24 DIAGNOSIS — Z7902 Long term (current) use of antithrombotics/antiplatelets: Secondary | ICD-10-CM

## 2021-11-24 DIAGNOSIS — E1122 Type 2 diabetes mellitus with diabetic chronic kidney disease: Secondary | ICD-10-CM | POA: Diagnosis present

## 2021-11-24 LAB — CBC WITH DIFFERENTIAL/PLATELET
Abs Immature Granulocytes: 0.04 10*3/uL (ref 0.00–0.07)
Basophils Absolute: 0.1 10*3/uL (ref 0.0–0.1)
Basophils Relative: 1 %
Eosinophils Absolute: 0.1 10*3/uL (ref 0.0–0.5)
Eosinophils Relative: 2 %
HCT: 35.9 % — ABNORMAL LOW (ref 39.0–52.0)
Hemoglobin: 11.3 g/dL — ABNORMAL LOW (ref 13.0–17.0)
Immature Granulocytes: 1 %
Lymphocytes Relative: 23 %
Lymphs Abs: 1.4 10*3/uL (ref 0.7–4.0)
MCH: 23.9 pg — ABNORMAL LOW (ref 26.0–34.0)
MCHC: 31.5 g/dL (ref 30.0–36.0)
MCV: 76.1 fL — ABNORMAL LOW (ref 80.0–100.0)
Monocytes Absolute: 0.6 10*3/uL (ref 0.1–1.0)
Monocytes Relative: 9 %
Neutro Abs: 3.8 10*3/uL (ref 1.7–7.7)
Neutrophils Relative %: 64 %
Platelets: 255 10*3/uL (ref 150–400)
RBC: 4.72 MIL/uL (ref 4.22–5.81)
RDW: 16.5 % — ABNORMAL HIGH (ref 11.5–15.5)
WBC: 5.9 10*3/uL (ref 4.0–10.5)
nRBC: 0 % (ref 0.0–0.2)

## 2021-11-24 LAB — URINALYSIS, ROUTINE W REFLEX MICROSCOPIC
Bacteria, UA: NONE SEEN
Bilirubin Urine: NEGATIVE
Glucose, UA: 50 mg/dL — AB
Ketones, ur: NEGATIVE mg/dL
Nitrite: NEGATIVE
Protein, ur: 100 mg/dL — AB
RBC / HPF: >50 RBC/hpf — ABNORMAL HIGH (ref 0–5)
Specific Gravity, Urine: 1.015 (ref 1.005–1.030)
pH: 7 (ref 5.0–8.0)

## 2021-11-24 LAB — COMPREHENSIVE METABOLIC PANEL
ALT: 21 U/L (ref 0–44)
AST: 21 U/L (ref 15–41)
Albumin: 3.5 g/dL (ref 3.5–5.0)
Alkaline Phosphatase: 66 U/L (ref 38–126)
Anion gap: 8 (ref 5–15)
BUN: 40 mg/dL — ABNORMAL HIGH (ref 8–23)
CO2: 21 mmol/L — ABNORMAL LOW (ref 22–32)
Calcium: 9.9 mg/dL (ref 8.9–10.3)
Chloride: 111 mmol/L (ref 98–111)
Creatinine, Ser: 1.9 mg/dL — ABNORMAL HIGH (ref 0.61–1.24)
GFR, Estimated: 37 mL/min — ABNORMAL LOW (ref >60)
Glucose, Bld: 147 mg/dL — ABNORMAL HIGH (ref 70–99)
Potassium: 3.9 mmol/L (ref 3.5–5.1)
Sodium: 140 mmol/L (ref 135–145)
Total Bilirubin: 0.9 mg/dL (ref 0.3–1.2)
Total Protein: 6.8 g/dL (ref 6.5–8.1)

## 2021-11-24 LAB — RESP PANEL BY RT-PCR (FLU A&B, COVID) ARPGX2
Influenza A by PCR: NEGATIVE
Influenza B by PCR: NEGATIVE
SARS Coronavirus 2 by RT PCR: NEGATIVE

## 2021-11-24 MED ORDER — MIRTAZAPINE 15 MG PO TABS
15.0000 mg | ORAL_TABLET | Freq: Every day | ORAL | Status: DC
  Administered 2021-11-25 – 2021-11-26 (×2): 15 mg via ORAL
  Filled 2021-11-24 (×4): qty 1

## 2021-11-24 MED ORDER — STROKE: EARLY STAGES OF RECOVERY BOOK: Freq: Once | Status: DC

## 2021-11-24 MED ORDER — CLOPIDOGREL BISULFATE 75 MG PO TABS
75.0000 mg | ORAL_TABLET | Freq: Every day | ORAL | Status: DC
  Administered 2021-11-26 – 2021-11-27 (×2): 75 mg via ORAL
  Filled 2021-11-24 (×2): qty 1

## 2021-11-24 MED ORDER — ACETAMINOPHEN 325 MG PO TABS: 650.0000 mg | ORAL_TABLET | ORAL | Status: DC | PRN

## 2021-11-24 MED ORDER — FINASTERIDE 5 MG PO TABS
5.0000 mg | ORAL_TABLET | Freq: Every evening | ORAL | Status: DC
  Administered 2021-11-25 – 2021-11-26 (×2): 5 mg via ORAL
  Filled 2021-11-24 (×3): qty 1

## 2021-11-24 MED ORDER — SODIUM BICARBONATE 650 MG PO TABS
650.0000 mg | ORAL_TABLET | Freq: Two times a day (BID) | ORAL | Status: DC
  Administered 2021-11-25 – 2021-11-27 (×4): 650 mg via ORAL
  Filled 2021-11-24 (×6): qty 1

## 2021-11-24 MED ORDER — CYCLOSPORINE 25 MG PO CAPS
150.0000 mg | ORAL_CAPSULE | Freq: Two times a day (BID) | ORAL | Status: DC
  Filled 2021-11-24 (×2): qty 6

## 2021-11-24 MED ORDER — INSULIN ASPART 100 UNIT/ML IJ SOLN
0.0000 [IU] | INTRAMUSCULAR | Status: DC
  Administered 2021-11-25: 2 [IU] via SUBCUTANEOUS

## 2021-11-24 MED ORDER — CALCITRIOL 0.25 MCG PO CAPS
0.2500 ug | ORAL_CAPSULE | Freq: Every day | ORAL | Status: DC
  Administered 2021-11-26 – 2021-11-27 (×2): 0.25 ug via ORAL
  Filled 2021-11-24 (×3): qty 1

## 2021-11-24 MED ORDER — ASPIRIN 300 MG RE SUPP
300.0000 mg | Freq: Every day | RECTAL | Status: DC
  Administered 2021-11-25: 300 mg via RECTAL
  Filled 2021-11-24: qty 1

## 2021-11-24 MED ORDER — ACETAMINOPHEN 650 MG RE SUPP: 650.0000 mg | RECTAL | Status: DC | PRN

## 2021-11-24 MED ORDER — LEVETIRACETAM 500 MG PO TABS
500.0000 mg | ORAL_TABLET | Freq: Two times a day (BID) | ORAL | Status: DC
  Filled 2021-11-24: qty 1

## 2021-11-24 MED ORDER — LEVETIRACETAM IN NACL 500 MG/100ML IV SOLN
500.0000 mg | Freq: Two times a day (BID) | INTRAVENOUS | Status: DC
Start: 2021-11-24 — End: 2021-11-27
  Administered 2021-11-25 – 2021-11-27 (×6): 500 mg via INTRAVENOUS
  Filled 2021-11-24 (×8): qty 100

## 2021-11-24 MED ORDER — ACETAMINOPHEN 160 MG/5ML PO SOLN: 650.0000 mg | ORAL | Status: DC | PRN

## 2021-11-24 MED ORDER — SENNOSIDES-DOCUSATE SODIUM 8.6-50 MG PO TABS: 1.0000 | ORAL_TABLET | Freq: Every evening | ORAL | Status: DC | PRN

## 2021-11-24 MED ORDER — ASPIRIN 325 MG PO TABS: 325.0000 mg | ORAL_TABLET | Freq: Every day | ORAL | Status: DC

## 2021-11-24 MED ORDER — LORAZEPAM 2 MG/ML IJ SOLN: 1.0000 mg | Freq: Once | INTRAMUSCULAR | Status: DC | PRN

## 2021-11-24 MED ORDER — LORAZEPAM 2 MG/ML IJ SOLN
1.0000 mg | Freq: Once | INTRAMUSCULAR | Status: AC | PRN
  Administered 2021-11-24: 1 mg via INTRAMUSCULAR
  Filled 2021-11-24: qty 1

## 2021-11-24 MED ORDER — ROSUVASTATIN CALCIUM 5 MG PO TABS
10.0000 mg | ORAL_TABLET | Freq: Every evening | ORAL | Status: DC
  Administered 2021-11-25 – 2021-11-26 (×2): 10 mg via ORAL
  Filled 2021-11-24 (×3): qty 2

## 2021-11-24 MED ORDER — ENOXAPARIN SODIUM 40 MG/0.4ML IJ SOSY
40.0000 mg | PREFILLED_SYRINGE | Freq: Every day | INTRAMUSCULAR | Status: DC
  Administered 2021-11-25 – 2021-11-27 (×3): 40 mg via SUBCUTANEOUS
  Filled 2021-11-24 (×3): qty 0.4

## 2021-11-24 MED ORDER — MYCOPHENOLATE SODIUM 180 MG PO TBEC
180.0000 mg | DELAYED_RELEASE_TABLET | Freq: Two times a day (BID) | ORAL | Status: DC
Start: 2021-11-24 — End: 2021-11-27
  Administered 2021-11-25 – 2021-11-27 (×4): 180 mg via ORAL
  Filled 2021-11-24 (×8): qty 1

## 2021-11-24 NOTE — H&P (Signed)
?History and Physical  ? ?Gilbert Wilson DQQ:[id number] DOB: 06/08/1946 DOA: 11/24/2021 ? ?PCP: [facility]  ? ?Patient coming from: Home ? ?Chief Complaint: Altered mental status ? ?HPI: Gilbert Wilson is a 76 y.o. 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 ? ?History obtained with assistance of chart review and patient's family considering his altered mental status and baseline dementia.  Wife reports that for the past 2 to 3 days patient has had slurred speech, slowed mentation.  Symptoms are worse today he did not recognize his wife and took longer than usual to recall his daughter's name. ? ?They did not notice any other focal deficits.  Does have chronic right-sided weakness/hemiplegia. ? ?Denies fevers, chills, chest pain, shortness of breath, abdominal pain, constipation, diarrhea, nausea, vomiting. ? ?ED Course: Vital signs in the ED significant for blood pressure in the 194R to 740C systolic.  Lab work-up included CMP with bicarb 21, BUN 40, creatinine 1.9 which is at baseline, glucose 147.  CBC with hemoglobin stable at 11.3.  Rest were panel flu COVID-negative.  Urinalysis with hemoglobin, protein, leukocytes but no bacteria.  Urine culture pending.  Chest x-ray showed no acute abnormality but low lung volumes.  CT head showed no acute abnormality but did show chronic changes.  MR brain showed acute infarct and advanced atrophy.  Patient received a dose of Ativan in the ED.  Neurology was consulted and are seeing the patient for stroke work-up. ? ?Review of Systems: As per HPI otherwise all other systems reviewed and are  negative. ? ?Past Medical History:  ?Diagnosis Date  ? CVA (cerebral vascular accident) ([place]) 2019  ? Dementia ([place])   ? Hepatic artery aneurysm ([place]) 2018  ? treated w/ procedure  ? Hepatitis C   ? HTN (hypertension)   ? Kidney failure   ? Liver failure ([place])   ? Seizure ([place])   ? Tachycardia requiring ablation 2017  ? Type 2 diabetes mellitus treated without insulin ([place])   ? ? ?Past Surgical History:  ?Procedure Laterality Date  ? KIDNEY TRANSPLANT  2016  ? same time as liver  ? LIVER TRANSPLANT N/A 2016  ? SVT ABLATION  2017  ? ? ?Social History ? reports that he quit smoking about 38 years ago. His smoking use included cigarettes. He has never used smokeless tobacco. He reports that he does not drink alcohol and does not use drugs. ? ?Allergies  ?Allergen Reactions  ? Levaquin [Levofloxacin] Itching  ? Linezolid   ?  Seizures  ? Lisinopril Cough  ? ? ?Family History  ?Problem Relation Age of Onset  ? Hypertension Mother   ? Diabetes Mother   ? Diabetes Father   ?Reviewed on admission ? ?Prior to Admission medications   ?Medication Sig Start Date End Date Taking? Authorizing Provider  ?calcitRIOL (ROCALTROL) 0.25 MCG capsule Take 0.25 mcg by mouth daily.    [provider]  ?clopidogrel (PLAVIX) 75 MG tablet Take 75 mg by mouth daily.    [provider]  ?cycloSPORINE modified (NEORAL) 100 MG capsule Take 100 mg by mouth 2 (two) times daily.    [provider]  ?Fe Bisgly-Succ-C-Thre-B12-FA (IRON-150 PO) Take 1 tablet by mouth daily.    [provider]  ?finasteride (PROSCAR) 5 MG tablet Take 5 mg by mouth every evening.    [provider]  ?hydrALAZINE (APRESOLINE) 50 MG tablet Take 1 tablet (50 mg total) by mouth 3 (three) times daily. 07/07/21   Lacinda Axon, MD  ?insulin glargine (LANTUS SOLOSTAR) 100 UNIT/ML Solostar Pen Inject 0-6 Units into the skin at bedtime. If BS>130-Take 6 units, Increase 1 unit for every 10 mg/dl above 130.    [provider]  ?levETIRAcetam (KEPPRA) 500 MG tablet Take 500 mg by mouth 2 (two) times daily.    [provider]  ?losartan (COZAAR) 100 MG tablet Take 1 tablet (100 mg total) by mouth daily. 07/08/21   Lacinda Axon, MD  ?mirtazapine (REMERON) 15 MG tablet Take 15 mg by mouth at bedtime.    [provider]  ?mycophenolate (MYFORTIC) 180 MG EC tablet Take 180 mg by mouth 2 (two) times daily.    [provider]  ?rosuvastatin (CRESTOR) 10 MG tablet Take 10 mg by mouth every evening.    [provider]  ?sodium bicarbonate 650 MG tablet Take 1,300 mg by mouth 2 (two) times daily.    [provider]  ? ? ?Physical Exam: ?Vitals:  ? 11/24/21 2030 11/24/21 2138 11/24/21 2200 11/24/21 2230  ?BP: 109/79 (!) 181/68 (!) 152/81 (!) 159/65  ?Pulse: 73 67 65 65  ?Resp: 20 20 16 18   ?Temp:      ?TempSrc:      ?SpO2: 100% 100% 100% 100%  ?Weight:      ?Height:      ? ? ?Physical Exam ?Constitutional:   ?   General: He is not in acute distress. ?   Appearance: Normal appearance.  ?HENT:  ?   Head: Normocephalic and atraumatic.  ?   Mouth/Throat:  ?   Mouth: Mucous membranes are moist.  ?   Pharynx: Oropharynx is clear.  ?Eyes:  ?   Extraocular Movements: Extraocular movements intact.  ?   Pupils: Pupils are equal, round, and reactive to light.  ?Cardiovascular:  ?   Rate and Rhythm: Normal rate and regular rhythm.  ?   Pulses: Normal pulses.  ?   Heart sounds: Normal heart sounds.  ?Pulmonary:  ?   Effort: Pulmonary effort is normal. No respiratory distress.  ?   Breath sounds: Normal breath sounds.  ?Abdominal:  ?   General: Bowel sounds are normal. There is no distension.  ?   Palpations: Abdomen is soft.  ?   Tenderness: There is no abdominal tenderness.  ?Musculoskeletal:     ?   General: No swelling or deformity.  ?Skin: ?   General: Skin is warm and dry.  ?Neurological:  ?   Comments: Mental Status: ?Patient is awake, alert, oriented to person and place only.  This is baseline for  him. ?Does appear to have some left-sided neglect.   ?Cranial Nerves: ?II: Pupils equal, round, and reactive to light.   ?III,IV, VI: Difficulty with leftward gaze ?V: Facial sensation is symmetric to light touch. ?VII: Facial movement is symmetric.  ?VIII: hearing is intact to voice ?X: Uvula elevates symmetrically ?XI: Shoulder shrug is chronically decreased on right. ?XII: tongue is midline without atrophy or fasciculations.  Slurred speech noted. ?Motor: Reasonable effort thorughout, at Least 1-2/5 right lower and upper extremity chronically, 4-5/5 left lower and upper extremity,  ?Sensory: Sensation is grossly intact bilateral UEs & LEs  ? ?Labs on Admission: I have personally reviewed  following labs and imaging studies ? ?CBC: ?Recent Labs  ?Lab 11/24/21 ?1834  ?WBC 5.9  ?NEUTROABS 3.8  ?HGB 11.3*  ?HCT 35.9*  ?MCV 76.1*  ?PLT 255  ? ? ?Basic Metabolic Panel: ?Recent Labs  ?Lab 11/24/21 ?1834  ?NA 140  ?K 3.9  ?CL 111  ?CO2 21*  ?GLUCOSE 147*  ?BUN 40*  ?CREATININE 1.90*  ?CALCIUM 9.9  ? ? ?GFR: ?Estimated Creatinine Clearance: 39.7 mL/min (A) (by C-G formula based on SCr of 1.9 mg/dL (H)). ? ?Liver Function Tests: ?Recent Labs  ?Lab 11/24/21 ?1834  ?AST 21  ?ALT 21  ?ALKPHOS 66  ?BILITOT 0.9  ?PROT 6.8  ?ALBUMIN 3.5  ? ? ?Urine analysis: ?   ?Component Value Date/Time  ? [place] YELLOW 11/24/2021 1902  ? APPEARANCEUR CLEAR 11/24/2021 1902  ? LABSPEC 1.015 11/24/2021 1902  ? PHURINE 7.0 11/24/2021 1902  ? GLUCOSEU 50 (A) 11/24/2021 1902  ? HGBUR MODERATE (A) 11/24/2021 1902  ? [place] NEGATIVE 11/24/2021 1902  ? [place] Stain NEGATIVE 11/24/2021 1902  ? PROTEINUR 100 (A) 11/24/2021 1902  ? NITRITE NEGATIVE 11/24/2021 1902  ? LEUKOCYTESUR MODERATE (A) 11/24/2021 1902  ? ? ?Radiological Exams on Admission: ?CT Head Wo Contrast ? ?Result Date: 11/24/2021 ?CLINICAL DATA:  Increasing confusion and slurred speech EXAM: CT HEAD WITHOUT CONTRAST TECHNIQUE: Contiguous axial images were obtained from the base of the  skull through the vertex without intravenous contrast. RADIATION DOSE REDUCTION: This exam was performed according to the departmental dose-optimization program which includes automated exposure control,

## 2021-11-24 NOTE — ED Notes (Signed)
Patient transported to CT 

## 2021-11-24 NOTE — ED Triage Notes (Addendum)
Pt BIB [facility] coming from home. Per family pt is having increased confusion and slurred speech x 2-3 days. Pt was treated for UTI 1.5 weeks ago.  ? ?EMS Vitals: ?200/100 ?HR 72 ?Temp 98.8 ?CBG 203 ?

## 2021-11-24 NOTE — ED Provider Notes (Signed)
?[facility] ?Provider Note ? ? ?CSN: [id number] ?Arrival date & time: 11/24/21  1753 ? ?  ? ?History ? ?Chief Complaint  ?Patient presents with  ? Altered Mental Status  ? ? ?Gilbert Wilson is a 76 y.o. male. ? ?76 year old male with past medical history of recurrent UTIs, dementia, history of CVA in 2019 with right-sided deficits, history of kidney failure, renal failure status post kidney and liver transplant, seizure disorder presents today for evaluation of slurred speech, slowed mentation of 2-3 duration that worsened today per wife.  Wife states today patient did not recognize her and took him longer than usual to recall name of his daughter.  Patient on my exam is alert and oriented to self, place, his wife.  He is not oriented to year, month.  He denies any chest pain, shortness of breath, abdominal pain.  He does have cough during interview which wife states has been present for about 4 to 5 days.  Given history of dementia wife provides most of the history.  Patient was recently treated for UTI. ? ?The history is provided by the patient. No language interpreter was used.  ? ?  ? ?Home Medications ?Prior to Admission medications   ?Medication Sig Start Date End Date Taking? Authorizing Provider  ?calcitRIOL (ROCALTROL) 0.25 MCG capsule Take 0.25 mcg by mouth daily.    [provider]  ?cinacalcet (SENSIPAR) 30 MG tablet Take 30 mg by mouth 2 (two) times daily with a meal.    [provider]  ?clopidogrel (PLAVIX) 75 MG tablet Take 75 mg by mouth daily.    [provider]  ?cycloSPORINE modified (NEORAL) 100 MG capsule Take 100 mg by mouth 2 (two) times daily.    [provider]  ?Fe Bisgly-Succ-C-Thre-B12-FA (IRON-150 PO) Take 1 tablet by mouth daily.    [provider]  ?finasteride (PROSCAR) 5 MG tablet Take 5 mg by mouth every evening.    [provider]  ?hydrALAZINE (APRESOLINE) 50 MG tablet Take 1 tablet (50  mg total) by mouth 3 (three) times daily. 07/07/21   Lacinda Axon, MD  ?insulin glargine (LANTUS SOLOSTAR) 100 UNIT/ML Solostar Pen Inject 0-6 Units into the skin at bedtime. If BS>130-Take 6 units, Increase 1 unit for every 10 mg/dl above 130.    [provider]  ?levETIRAcetam (KEPPRA) 500 MG tablet Take 500 mg by mouth 2 (two) times daily.    [provider]  ?losartan (COZAAR) 100 MG tablet Take 1 tablet (100 mg total) by mouth daily. 07/08/21   Lacinda Axon, MD  ?magnesium oxide (MAG-OX) 400 MG tablet Take 400 mg by mouth daily.    [provider]  ?mirtazapine (REMERON) 15 MG tablet Take 15 mg by mouth at bedtime.    [provider]  ?mycophenolate (MYFORTIC) 180 MG EC tablet Take 180 mg by mouth 2 (two) times daily.    [provider]  ?rosuvastatin (CRESTOR) 10 MG tablet Take 10 mg by mouth every evening.    [provider]  ?sodium bicarbonate 650 MG tablet Take 1,300 mg by mouth 2 (two) times daily.    [provider]  ?   ? ?Allergies    ?Levaquin [levofloxacin], Linezolid, and Lisinopril   ? ?Review of Systems   ?Review of Systems  ?Unable to perform ROS: Dementia  ?Respiratory:  Negative for shortness of breath.   ?Cardiovascular:  Negative for chest pain.  ?Gastrointestinal:  Negative for abdominal pain.  ? ?  Physical Exam ?Updated Vital Signs ?BP (!) 153/78 (BP Location: Left Arm)   Pulse 72   Temp 98.4 ?F (36.9 ?C) (Oral)   Resp 18   Ht 6\' 2"  (1.88 m)   Wt 90.7 kg   SpO2 100%   BMI 25.68 kg/m?  ?Physical Exam ?Vitals and nursing note reviewed.  ?Constitutional:   ?   General: He is not in acute distress. ?   Appearance: Normal appearance. He is not ill-appearing.  ?HENT:  ?   Head: Normocephalic and atraumatic.  ?   Nose: Nose normal.  ?Eyes:  ?   General: No scleral icterus. ?   Extraocular Movements: Extraocular movements intact.  ?   Conjunctiva/sclera: Conjunctivae normal.  ?Cardiovascular:  ?   Rate and Rhythm:  Normal rate and regular rhythm.  ?   Pulses: Normal pulses.  ?Pulmonary:  ?   Effort: Pulmonary effort is normal. No respiratory distress.  ?   Breath sounds: Normal breath sounds. No wheezing.  ?   Comments: Coarse lung sounds throughout ?Abdominal:  ?   General: There is no distension.  ?   Tenderness: There is no abdominal tenderness. There is no right CVA tenderness, left CVA tenderness or guarding.  ?Musculoskeletal:     ?   General: Normal range of motion.  ?   Cervical back: Normal range of motion.  ?   Right lower leg: No edema.  ?   Left lower leg: No edema.  ?Skin: ?   General: Skin is warm and dry.  ?Neurological:  ?   General: No focal deficit present.  ?   Mental Status: He is alert. Mental status is at baseline.  ? ? ?ED Results / Procedures / Treatments   ?Labs ?(all labs ordered are listed, but only abnormal results are displayed) ?Labs Reviewed  ?RESP PANEL BY RT-PCR (FLU A&B, COVID) ARPGX2  ?CBC WITH DIFFERENTIAL/PLATELET  ?COMPREHENSIVE METABOLIC PANEL  ? ? ?EKG ?None ? ?Radiology ?No results found. ? ?Procedures ?Procedures  ? ? ?Medications Ordered in ED ?Medications - No data to display ? ?ED Course/ Medical Decision Making/ A&P ?  ?                        ?Medical Decision Making ?Amount and/or Complexity of Data Reviewed ?Labs: ordered. ?Radiology: ordered. ? ?Risk ?Prescription drug management. ? ? ?Medical Decision Making / ED Course ? ? ?This patient presents to the ED for concern of slurred speech, slowed mentation, this involves an extensive number of treatment options, and is a complaint that carries with it a high risk of complications and morbidity.  The differential diagnosis includes acute intracranial hemorrhage, CVA, bacterial infection such as pneumonia or UTI metabolic encephalopathy ? ?MDM: ?76 year old male presents with his wife for evaluation of slurred speech, slowed mentation of 2 to 3-day duration which was more pronounced today with not being able to recognize his wife.   Patient has past medical history significant for recurrent UTIs, dementia, history of CVA in 2019 with right-sided deficits, kidney and liver transplant, seizure disorder.  Patient is poor historian given history of dementia.  Will evaluate with CBC, BMP, UA, chest x-ray, CT head, MRI brain, urine culture. ?CBC without leukocytosis mild anemia at hemoglobin of 11.3 otherwise unremarkable.  Respiratory panel negative for COVID or flu.  UA with moderate leukocytes however this is at baseline without significant WBCs or bacteria.  Will send urine culture.  UA findings are likely chronic.  CMP with creatinine 1.9 which is improved from recent creatinine of 2.4 otherwise without acute findings.  MRI brain with acute infarct in the right corona radiata.  Will discuss case with neurology. ?Discussed with neurology who recommends medical admission for further work-up of CVA.  Discussed with hospitalist will evaluate patient for admission. ? ? ?Additional history obtained: ?-Additional history obtained from wife at bedside ?-External records from outside source obtained and reviewed including: Chart review including previous notes, labs, imaging, consultation notes ? ? ?Lab Tests: ?-I ordered, reviewed, and interpreted labs.   ?The pertinent results include:   ?Labs Reviewed  ?CBC WITH DIFFERENTIAL/PLATELET - Abnormal; Notable for the following components:  ?    Result Value  ? Hemoglobin 11.3 (*)   ? HCT 35.9 (*)   ? MCV 76.1 (*)   ? MCH 23.9 (*)   ? RDW 16.5 (*)   ? All other components within normal limits  ?COMPREHENSIVE METABOLIC PANEL - Abnormal; Notable for the following components:  ? CO2 21 (*)   ? Glucose, Bld 147 (*)   ? BUN 40 (*)   ? Creatinine, Ser 1.90 (*)   ? GFR, Estimated 37 (*)   ? All other components within normal limits  ?URINALYSIS, ROUTINE W REFLEX MICROSCOPIC - Abnormal; Notable for the following components:  ? Glucose, UA 50 (*)   ? Hgb urine dipstick MODERATE (*)   ? Protein, ur 100 (*)   ?  Leukocytes,Ua MODERATE (*)   ? RBC / HPF >50 (*)   ? All other components within normal limits  ?RESP PANEL BY RT-PCR (FLU A&B, COVID) ARPGX2  ?URINE CULTURE  ?  ? ? ?EKG ? EKG Interpretation ? ?Date/Time:  Sunday Altamese Dilling

## 2021-11-24 NOTE — ED Notes (Signed)
Patient transported to MRI 

## 2021-11-24 NOTE — Consult Note (Signed)
?                    NEURO HOSPITALIST CONSULT NOTE  ? ?Requestig physician: Dr. Darl Householder ? ?Reason for Consult: Acute right corona radiata ischemic infarction  ? ?History obtained from:  Family and Chart    ? ?HPI:                                                                                                                                         ? Gilbert Wilson is an 76 y.o. 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 Wife stated  that prior to going to the ED, the patient did not recognize her and that it took him longer than usual to recall name of his daughter. BP was 200/100 on presentation, with HR 72, Temp 98.8 and CBG 203.  ? ?MRI brain revealed a small acute right corona radiata ischemic infarction. Neurology was called to evaluate.  ? ?Past Medical History:  ?Diagnosis Date  ? CVA (cerebral vascular accident) ([place]) 2019  ? Dementia ([place])   ? 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 treated w/ procedure  ? Hepatitis C   ? HTN (hypertension)   ? Kidney failure   ? Liver failure ([place])   ? Seizure ([place])   ? Tachycardia requiring ablation 2017  ? Type 2 diabetes mellitus treated without insulin ([place])   ? ? ?Past Surgical History:  ?Procedure Laterality Date  ? KIDNEY TRANSPLANT  2016  ? same time as liver  ? LIVER TRANSPLANT N/A 2016  ? SVT ABLATION  2017  ? ? ?Family History  ?Problem Relation Age of Onset  ? Hypertension Mother   ? Diabetes Mother   ? Diabetes Father   ?          ? ?Social History:  reports that he quit smoking about 38 years ago. His smoking use included cigarettes. He has never used smokeless tobacco. He reports that he does not drink alcohol and does not use drugs. ? ?Allergies  ?Allergen Reactions  ? Levaquin [Levofloxacin]  Itching  ? Linezolid   ?  Seizures  ? Lisinopril Cough  ? ? ?MEDICATIONS:                                                                                                                     ?  No current facility-administered medications on file prior to encounter.  ? ?Current Outpatient Medications on File Prior to Encounter  ?Medication Sig Dispense Refill  ? calcitRIOL (ROCALTROL) 0.25 MCG capsule Take 0.25 mcg by mouth daily.    ? clopidogrel (PLAVIX) 75 MG tablet Take 75 mg by mouth daily.    ? cycloSPORINE modified (NEORAL) 100 MG capsule Take 100 mg by mouth 2 (two) times daily.    ? Fe Bisgly-Succ-C-Thre-B12-FA (IRON-150 PO) Take 1 tablet by mouth daily.    ? finasteride (PROSCAR) 5 MG tablet Take 5 mg by mouth every evening.    ? hydrALAZINE (APRESOLINE) 50 MG tablet Take 1 tablet (50 mg total) by mouth 3 (three) times daily. (Patient taking differently: Take 100 mg by mouth 3 (three) times daily.) 90 tablet 3  ? insulin glargine (LANTUS SOLOSTAR) 100 UNIT/ML Solostar Pen Inject 0-6 Units into the skin at bedtime. If BS>130-Take 6 units, Increase 1 unit for every 10 mg/dl above 130.    ? levETIRAcetam (KEPPRA) 500 MG tablet Take 500 mg by mouth 2 (two) times daily.    ? losartan (COZAAR) 100 MG tablet Take 1 tablet (100 mg total) by mouth daily. (Patient taking differently: Take 50 mg by mouth 2 (two) times daily.) 90 tablet 1  ? MAGNESIUM OXIDE 400 PO Take 400 mg by mouth daily.    ? mirtazapine (REMERON) 15 MG tablet Take 15 mg by mouth at bedtime.    ? mycophenolate (MYFORTIC) 180 MG EC tablet Take 180 mg by mouth 2 (two) times daily.    ? rosuvastatin (CRESTOR) 10 MG tablet Take 10 mg by mouth every evening.    ? sodium bicarbonate 650 MG tablet Take 1,300 mg by mouth 2 (two) times daily.    ? ? ? ?ROS:                                                                                                                                       ?Unable to obtain due to severe dysarthria and minimal speech output.   ? ? ?Blood pressure (!) 181/68, pulse 67, temperature 98.4 ?F (36.9 ?C), temperature source Oral, resp. rate 20, height 6\' 2"  (1.88 m), weight 90.7 kg, SpO2 100 %. ? ? ?General Examination:                                                                                                      ? ?Physical Exam  ?HEENT-  [place]/AT    ?Lungs-  Respirations unlabored ?Extremities- Warm and well perfused ? ?Neurological Examination ?Mental Status: Mildly drowsy with decreased level of alertness. Yawns frequently during exam. Prominent left hemineglect is noted with eyes and head rotated to the right and inattention to left sided stimuli. Speech with severely increased latencies of verbal responses, dysarthria and minimal output consisting of 1-3 word answers to questions. Follows only simple commands. Easily distracted. Oriented only to the state.  ?Cranial Nerves: ?II: Blinks to threat in left and right temporal visual fields. PERRL.  ?III,IV, VI: Eyes conjugately deviated to the right. Cannot track past the midline to the left. No nystagmus.  ?V: Unable to formally assess ?VII: Smile symmetric.  ?VIII: Hearing intact to some questions and commands.  ?IX,X: Palate elevates normally when yawning.  ?XI: Head preferentially rotated to the right.  ?XII: Midline tongue noted when yawning.  ?Motor: ?Bradykinetic x 4. No muscle rigidity noted.  ?RUE 4/5 ?LUE 4+/5 ?RLE 4/5 ?LLE 4+/5 ?Sensory: Decreased reactivity to pinch in all 4 limbs ?Deep Tendon Reflexes: 1+ and symmetric throughout ?Plantars: Equivocal bilaterally ?Cerebellar: Unable to demonstrate comprehension of commands for testing.  ?Gait: Deferred ?  ?Lab Results: ?Basic Metabolic Panel: ?Recent Labs  ?Lab 11/24/21 ?1834  ?NA 140  ?K 3.9  ?CL 111  ?CO2 21*  ?GLUCOSE 147*  ?BUN 40*  ?CREATININE 1.90*  ?CALCIUM 9.9  ? ? ?CBC: ?Recent Labs  ?Lab 11/24/21 ?1834  ?WBC 5.9  ?NEUTROABS 3.8  ?HGB 11.3*  ?HCT 35.9*  ?MCV 76.1*  ?PLT 255  ? ? ?Cardiac Enzymes: ?No results for  input(s): CKTOTAL, CKMB, CKMBINDEX, TROPONINI in the last 168 hours. ? ?Lipid Panel: ?No results for input(s): CHOL, TRIG, HDL, CHOLHDL, VLDL, LDLCALC in the last 168 hours. ? ?Imaging: ?CT Head Wo Contrast ? ?Result Date: 11/24/2021 ?CLINICAL DATA:  Increasing confusion and slurred speech EXAM: CT HEAD WITHOUT CONTRAST TECHNIQUE: Contiguous axial images were obtained from the base of the skull through the vertex without intravenous contrast. RADIATION DOSE REDUCTION: This exam was performed according to the departmental dose-optimization program which includes automated exposure control, adjustment of the mA and/or kV according to patient size and/or use of iterative reconstruction technique. COMPARISON:  09/03/2020 FINDINGS: Brain: No evidence of acute infarction, hemorrhage, hydrocephalus, extra-axial collection or mass lesion/mass effect. Chronic atrophic and ischemic changes are noted similar to that seen on the prior exam. Vascular: No hyperdense vessel or unexpected calcification. Skull: Normal. Negative for fracture or focal lesion. Sinuses/Orbits: No acute finding. Other: None. IMPRESSION: Chronic atrophic and ischemic changes.  No acute abnormality noted. Electronically Signed   By: Inez Catalina M.D.   On: 11/24/2021 19:25  ? ?MR BRAIN WO CONTRAST ? ?Result Date: 11/24/2021 ?CLINICAL DATA:  Confusion slurred speech EXAM: MRI HEAD WITHOUT CONTRAST TECHNIQUE: Multiplanar, multiecho pulse sequences of the brain and surrounding structures were obtained without intravenous contrast. COMPARISON:  No prior MRI, correlation is made with CT head 11/24/2021 FINDINGS: Brain: Focal area of restricted diffusion with ADC correlate in the right corona radiata, which measures up to 11 x 17 x 7 mm (series 5, image 80 and series 7, image 51), consistent with acute infarct. No other areas of restricted diffusion. No definite acute hemorrhage, although multiple areas of hemosiderin deposition are seen in the bilateral cerebral  and right cerebellar hemisphere, likely related to prior infarcts. Confluent T2 hyperintense signal in the periventricular white matter, with multiple lacunar infarcts, likely the sequela of severe chronic s

## 2021-11-25 ENCOUNTER — Observation Stay (HOSPITAL_COMMUNITY): Payer: No Typology Code available for payment source

## 2021-11-25 ENCOUNTER — Inpatient Hospital Stay (HOSPITAL_COMMUNITY): Payer: No Typology Code available for payment source

## 2021-11-25 DIAGNOSIS — I129 Hypertensive chronic kidney disease with stage 1 through stage 4 chronic kidney disease, or unspecified chronic kidney disease: Secondary | ICD-10-CM | POA: Diagnosis present

## 2021-11-25 DIAGNOSIS — I4891 Unspecified atrial fibrillation: Secondary | ICD-10-CM | POA: Diagnosis not present

## 2021-11-25 DIAGNOSIS — I69351 Hemiplegia and hemiparesis following cerebral infarction affecting right dominant side: Secondary | ICD-10-CM | POA: Diagnosis not present

## 2021-11-25 DIAGNOSIS — Z79899 Other long term (current) drug therapy: Secondary | ICD-10-CM | POA: Diagnosis not present

## 2021-11-25 DIAGNOSIS — G4733 Obstructive sleep apnea (adult) (pediatric): Secondary | ICD-10-CM | POA: Diagnosis present

## 2021-11-25 DIAGNOSIS — N401 Enlarged prostate with lower urinary tract symptoms: Secondary | ICD-10-CM | POA: Diagnosis present

## 2021-11-25 DIAGNOSIS — E785 Hyperlipidemia, unspecified: Secondary | ICD-10-CM | POA: Diagnosis present

## 2021-11-25 DIAGNOSIS — I440 Atrioventricular block, first degree: Secondary | ICD-10-CM | POA: Diagnosis present

## 2021-11-25 DIAGNOSIS — I639 Cerebral infarction, unspecified: Secondary | ICD-10-CM | POA: Diagnosis present

## 2021-11-25 DIAGNOSIS — N1832 Chronic kidney disease, stage 3b: Secondary | ICD-10-CM | POA: Diagnosis present

## 2021-11-25 DIAGNOSIS — R471 Dysarthria and anarthria: Secondary | ICD-10-CM | POA: Diagnosis present

## 2021-11-25 DIAGNOSIS — G464 Cerebellar stroke syndrome: Secondary | ICD-10-CM | POA: Diagnosis present

## 2021-11-25 DIAGNOSIS — F0390 Unspecified dementia without behavioral disturbance: Secondary | ICD-10-CM

## 2021-11-25 DIAGNOSIS — R8281 Pyuria: Secondary | ICD-10-CM

## 2021-11-25 DIAGNOSIS — G40909 Epilepsy, unspecified, not intractable, without status epilepticus: Secondary | ICD-10-CM | POA: Diagnosis present

## 2021-11-25 DIAGNOSIS — Z881 Allergy status to other antibiotic agents status: Secondary | ICD-10-CM | POA: Diagnosis not present

## 2021-11-25 DIAGNOSIS — I6389 Other cerebral infarction: Secondary | ICD-10-CM

## 2021-11-25 DIAGNOSIS — Z7902 Long term (current) use of antithrombotics/antiplatelets: Secondary | ICD-10-CM | POA: Diagnosis not present

## 2021-11-25 DIAGNOSIS — E1122 Type 2 diabetes mellitus with diabetic chronic kidney disease: Secondary | ICD-10-CM | POA: Diagnosis present

## 2021-11-25 DIAGNOSIS — Z20822 Contact with and (suspected) exposure to covid-19: Secondary | ICD-10-CM | POA: Diagnosis present

## 2021-11-25 DIAGNOSIS — Z940 Kidney transplant status: Secondary | ICD-10-CM | POA: Diagnosis not present

## 2021-11-25 DIAGNOSIS — Z944 Liver transplant status: Secondary | ICD-10-CM | POA: Diagnosis not present

## 2021-11-25 DIAGNOSIS — R29810 Facial weakness: Secondary | ICD-10-CM | POA: Diagnosis present

## 2021-11-25 DIAGNOSIS — Z794 Long term (current) use of insulin: Secondary | ICD-10-CM | POA: Diagnosis not present

## 2021-11-25 DIAGNOSIS — F4310 Post-traumatic stress disorder, unspecified: Secondary | ICD-10-CM | POA: Diagnosis present

## 2021-11-25 DIAGNOSIS — I6381 Other cerebral infarction due to occlusion or stenosis of small artery: Secondary | ICD-10-CM | POA: Diagnosis present

## 2021-11-25 DIAGNOSIS — F03A0 Unspecified dementia, mild, without behavioral disturbance, psychotic disturbance, mood disturbance, and anxiety: Secondary | ICD-10-CM | POA: Diagnosis present

## 2021-11-25 DIAGNOSIS — D631 Anemia in chronic kidney disease: Secondary | ICD-10-CM | POA: Diagnosis present

## 2021-11-25 LAB — ECHOCARDIOGRAM COMPLETE
AR max vel: 3.36 cm2
AV Area VTI: 3.14 cm2
AV Area mean vel: 3.02 cm2
AV Mean grad: 6 mmHg
AV Peak grad: 10.5 mmHg
Ao pk vel: 1.62 m/s
Area-P 1/2: 3.37 cm2
Calc EF: 53.7 %
Height: 74 in
P 1/2 time: 668 msec
S' Lateral: 2.4 cm
Single Plane A2C EF: 52.4 %
Single Plane A4C EF: 63.3 %
Weight: 3200 oz

## 2021-11-25 LAB — LIPID PANEL
Cholesterol: 106 mg/dL (ref 0–200)
HDL: 44 mg/dL (ref >40)
LDL Cholesterol: 50 mg/dL (ref 0–99)
Total CHOL/HDL Ratio: 2.4 RATIO
Triglycerides: 59 mg/dL (ref <150)
VLDL: 12 mg/dL (ref 0–40)

## 2021-11-25 LAB — CBG MONITORING, ED
Glucose-Capillary: 140 mg/dL — ABNORMAL HIGH (ref 70–99)
Glucose-Capillary: 133 mg/dL — ABNORMAL HIGH (ref 70–99)
Glucose-Capillary: 154 mg/dL — ABNORMAL HIGH (ref 70–99)

## 2021-11-25 LAB — GLUCOSE, CAPILLARY
Glucose-Capillary: 157 mg/dL — ABNORMAL HIGH (ref 70–99)
Glucose-Capillary: 125 mg/dL — ABNORMAL HIGH (ref 70–99)

## 2021-11-25 LAB — HEMOGLOBIN A1C
Hgb A1c MFr Bld: 6.5 % — ABNORMAL HIGH (ref 4.8–5.6)
Mean Plasma Glucose: 139.85 mg/dL

## 2021-11-25 MED ORDER — INSULIN ASPART 100 UNIT/ML IJ SOLN
0.0000 [IU] | Freq: Three times a day (TID) | INTRAMUSCULAR | Status: DC
  Administered 2021-11-26: 2 [IU] via SUBCUTANEOUS
  Administered 2021-11-26: 1 [IU] via SUBCUTANEOUS

## 2021-11-25 MED ORDER — LORAZEPAM 2 MG/ML IJ SOLN: 0.5000 mg | Freq: Once | INTRAMUSCULAR | Status: DC | PRN

## 2021-11-25 MED ORDER — INSULIN ASPART 100 UNIT/ML IJ SOLN: 0.0000 [IU] | Freq: Every day | INTRAMUSCULAR | Status: DC

## 2021-11-25 MED ORDER — STROKE: EARLY STAGES OF RECOVERY BOOK
Freq: Once | Status: AC
  Filled 2021-11-25 (×2): qty 1

## 2021-11-25 MED ORDER — CHLORHEXIDINE GLUCONATE CLOTH 2 % EX PADS
6.0000 | MEDICATED_PAD | Freq: Every day | CUTANEOUS | Status: DC
  Administered 2021-11-25 – 2021-11-27 (×3): 6 via TOPICAL

## 2021-11-25 MED ORDER — ASPIRIN EC 81 MG PO TBEC
81.0000 mg | DELAYED_RELEASE_TABLET | Freq: Every day | ORAL | Status: DC
Start: 2021-11-25 — End: 2021-11-27
  Administered 2021-11-26 – 2021-11-27 (×2): 81 mg via ORAL
  Filled 2021-11-25 (×3): qty 1

## 2021-11-25 MED ORDER — CYCLOSPORINE MODIFIED (NEORAL) 25 MG PO CAPS
150.0000 mg | ORAL_CAPSULE | Freq: Two times a day (BID) | ORAL | Status: DC
  Administered 2021-11-25 – 2021-11-27 (×5): 150 mg via ORAL
  Filled 2021-11-25 (×7): qty 6

## 2021-11-25 NOTE — Assessment & Plan Note (Addendum)
Hx prior stroke 2019 with R sided weakness ?Presenting now with confusion and slurred speech for Gilbert Wilson few days  ?Head CT with chronic atrophy and ischemic changes ?MRI brain with acute infarct in right corona radiata ?Carotid US pending  ?MRA head pending  ?Echo pending ?Telemetry ?PT/OT/SLP -> home health ?LDL 50, continue crestor ?A1c 6.5 ?Appreciate neurology recommendings -> add ASA to plavix for DAPT (failed plavix monotherapy).  Recommending DAPTx3 weeks, then aspirin alone.  Continue crestor. ?Maybe [name] little delirium today with waxing/waning mental status per discussion with wife, follow closely w/u further if worsening/persistent ? ? ? ?

## 2021-11-25 NOTE — Assessment & Plan Note (Addendum)
Finasteride ?Has chronic indwelling foley  ?

## 2021-11-25 NOTE — Evaluation (Signed)
Speech Language Pathology Evaluation ?Patient Details ?Name: Naseer Hearn ?MRN: [id number] ?DOB: 07-17-1946 ?Today's Date: 11/25/2021 ?Time: 1030-1045 ?SLP Time Calculation (min) (ACUTE ONLY): 15 min ? ?Problem List:  ?Patient Active Problem List  ? Diagnosis Date Noted  ? Dementia ([place]) 11/25/2021  ? Pyuria 11/25/2021  ? Type 2 diabetes mellitus ([place]) 11/24/2021  ? Stroke [facility]) 11/24/2021  ? Vitamin D deficiency 11/24/2021  ? Immunodeficiency, unspecified ([place]) 11/24/2021  ? Seizure ([place]) 11/24/2021  ? Sensorineural hearing loss 11/24/2021  ? Posttraumatic stress disorder 11/24/2021  ? Obstructive sleep apnea of adult 11/24/2021  ? 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 History of renal transplant 11/24/2021  ? Liver transplant status ([place]) 11/24/2021  ? Hemiplegia, unspecified affecting unspecified side ([place]) 11/24/2021  ? Essential hypertension 11/24/2021  ? End stage renal disease ([place]) 11/24/2021  ? Dysphagia, unspecified 11/24/2021  ? Chronic hepatitis C ([place]) 11/24/2021  ? Cirrhosis of liver ([place]) 11/24/2021  ? Cerebellar stroke syndrome 11/24/2021  ? Cardiomegaly 11/24/2021  ? Beta thalassemia trait 11/24/2021  ? Benign prostatic hyperplasia with lower urinary tract symptoms 11/24/2021  ? Atrial fibrillation ([place]) 11/24/2021  ? Liver transplant recipient [facility]) 11/24/2021  ? Acute CVA (cerebrovascular accident) ([place]) 11/24/2021  ? Symptomatic bradycardia 07/04/2021  ? Anemia 05/21/2009  ? ?Past Medical History:  ?Past Medical History:  ?Diagnosis Date  ? CVA (cerebral vascular accident) ([place]) 2019  ? Dementia ([place])   ? 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 treated w/ procedure  ? Hepatitis C   ? HTN (hypertension)   ? Kidney failure   ? Liver failure ([place])   ? Seizure ([place])   ? Tachycardia requiring ablation 2017  ? Type 2 diabetes mellitus treated without insulin ([place])   ? ?Past Surgical History:  ?Past Surgical History:   ?Procedure Laterality Date  ? KIDNEY TRANSPLANT  2016  ? same time as liver  ? LIVER TRANSPLANT N/A 2016  ? SVT ABLATION  2017  ? ?HPI:  ?76 y.o. 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  Mr. Dahir lives at home with his wife and family who provide 24 hour support.  ? ?Assessment / Plan / Recommendation ?Clinical Impression ? Pt presents with an acute dysarthria overlying baseline cognitive deficits related to dementia.  He was personable, answered biographical questions about home state, career, family.  He demonstrates difficulty with recall and attention; language is marked by errors of naming, but output is fluent.  He was able to follow two-step commands without difficulty. His wife reports that he is approaching baseline with regard to communication/cognition - the primary change is his clarity of speech. SLP will follow during admission to address dysarthria.  ?   ?SLP Assessment ? SLP Recommendation/Assessment: Patient needs continued [facility] ?SLP Visit Diagnosis: Dysarthria and anarthria (R47.1)  ?  ?Recommendations for follow up therapy are one component of a multi-disciplinary discharge planning process, led by the attending physician.  Recommendations may be updated based on patient status, additional functional criteria and insurance authorization. ?   ?Follow Up Recommendations ? Other (comment) (tba)  ?  ?  Assistance Recommended at Discharge ? Frequent or constant Supervision/Assistance  ?Functional Status Assessment Patient has had a recent decline in  their functional status and/or demonstrates limited ability to make significant improvements in function in a reasonable and predictable amount of time  ?Frequency and Duration min 2x/week  ?1 week ?  ?   ?SLP Evaluation ?Cognition ? Overall Cognitive Status: History of cognitive impairments - at baseline ?Arousal/Alertness: Awake/alert ?Orientation Level: Oriented to person ?Year: 2022 ?Attention: Sustained ?Sustained Attention: Appears intact ?Memory: Impaired ?Memory Impairment: Storage deficit;Retrieval deficit ?Awareness: Impaired ?Awareness Impairment: Intellectual impairment ?Problem Solving: Impaired  ?  ?   ?Comprehension ? Auditory Comprehension ?Overall Auditory Comprehension: Appears within functional limits for tasks assessed (for basic tasks and information) ?Yes/No Questions: Within Functional Limits ?Commands: Within Functional Limits ?Conversation: Simple ?EffectiveTechniques: Extra processing time ?Reading Comprehension ?Reading Status: Not tested  ?  ?Expression Expression ?Primary Mode of Expression: Verbal ?Verbal Expression ?Overall Verbal Expression: Impaired ?Initiation: Impaired ?Level of Generative/Spontaneous Verbalization: Sentence ?Repetition: No impairment ?Naming: Impairment ?Responsive: 76-100% accurate ?Confrontation: Impaired ?Convergent: 75-100% accurate ?Written Expression ?Dominant Hand:  (right handed at baseline) ?Written Expression: Not tested   ?Oral / Motor ? Oral Motor/Sensory Function ?Overall Oral Motor/Sensory Function: Mild impairment ?Motor Speech ?Overall Motor Speech: Impaired ?Respiration: Within functional limits ?Phonation: Normal ?Resonance: Within functional limits ?Articulation: Impaired ?Level of Impairment: Phrase ?Motor Planning: Witnin functional limits   ?        ? ?[name] ?11/25/2021, 12:07 PM ?Estill Bamberg L. Janecia Palau, MA CCC/SLP ?Acute Rehabilitation Services ?Office number [phone] ?Pager [phone] ? ? ?

## 2021-11-25 NOTE — Assessment & Plan Note (Signed)
Continue keppra

## 2021-11-25 NOTE — Assessment & Plan Note (Addendum)
Continue myfortic, cyclosporine  ?Creatinine is 1.9 which appears around his baseline ?Continue calcitriol, bicarb ?R forearm fistula ?

## 2021-11-25 NOTE — ED Notes (Signed)
The pt is tracking my movement and he turned his head to the lt and appeared to stare in my   direction  perhaps I struck his bed with the iv pole  I asked if if he was ok and he answered ok ?

## 2021-11-25 NOTE — Progress Notes (Addendum)
?  Transition of Care (TOC) Screening Note ? ? ?Patient Details  ?Name: Gilbert Wilson ?Date of Birth: 1946-07-06 ? ? ?Transition of Care (TOC) CM/SW Contact:    ?Cyndi Bender, RN ?Phone Number: ?11/25/2021, 3:18 PM ? ? ? ?Transition of Care Department [facility]) has reviewed patient and no TOC needs have been identified at this time. We will continue to monitor patient advancement through interdisciplinary progression rounds. If new patient transition needs arise, please place a TOC consult. ? ? ?Notified [facility] [phone] ?

## 2021-11-25 NOTE — Progress Notes (Addendum)
STROKE TEAM PROGRESS NOTE  ? ?INTERVAL HISTORY ?Patient is seen in his room with his wife and daughter at the bedside.  Yesterday, he had an episode of confusion where he was unable to recognize family members.  He was recently treated for a UTI and had not quite returned to his baseline.  MRI brain revealed a right corona radiata infarct.  MR angiogram of the brain shows no large vessel stenosis or occlusion.  LDL cholesterol is 50 mg percent and hemoglobin A1c 6.5.  Echocardiogram i shows ejection fraction of 60 to 65% without cardiac source of embolism.  Patient has mild dementia but has enough support at home look after him.  He is essentially been bedridden for the last 4 years following his previous stroke. ? ?Vitals:  ? 11/25/21 0600 11/25/21 0630 11/25/21 0653 11/25/21 0700  ?BP: 123/62 (!) 168/81  (!) 140/58  ?Pulse: (!) 39 65  63  ?Resp: 13 (!) 27  18  ?Temp:   98.4 ?F (36.9 ?C)   ?TempSrc:      ?SpO2: 99% 100%  100%  ?Weight:      ?Height:      ? ?CBC:  ?Recent Labs  ?Lab 11/24/21 ?1834  ?WBC 5.9  ?NEUTROABS 3.8  ?HGB 11.3*  ?HCT 35.9*  ?MCV 76.1*  ?PLT 255  ? ?Basic Metabolic Panel:  ?Recent Labs  ?Lab 11/24/21 ?1834  ?NA 140  ?K 3.9  ?CL 111  ?CO2 21*  ?GLUCOSE 147*  ?BUN 40*  ?CREATININE 1.90*  ?CALCIUM 9.9  ? ?Lipid Panel:  ?Recent Labs  ?Lab 11/25/21 ?0346  ?CHOL 106  ?TRIG 59  ?HDL 44  ?CHOLHDL 2.4  ?VLDL 12  ?Holliday 50  ? ?HgbA1c:  ?Recent Labs  ?Lab 11/25/21 ?0346  ?HGBA1C 6.5*  ? ?Urine Drug Screen: No results for input(s): LABOPIA, COCAINSCRNUR, LABBENZ, AMPHETMU, THCU, LABBARB in the last 168 hours.  ?Alcohol Level No results for input(s): ETH in the last 168 hours. ? ?IMAGING past 24 hours ?CT Head Wo Contrast ? ?Result Date: 11/24/2021 ?CLINICAL DATA:  Increasing confusion and slurred speech EXAM: CT HEAD WITHOUT CONTRAST TECHNIQUE: Contiguous axial images were obtained from the base of the skull through the vertex without intravenous contrast. RADIATION DOSE REDUCTION: This exam was  performed according to the departmental dose-optimization program which includes automated exposure control, adjustment of the mA and/or kV according to patient size and/or use of iterative reconstruction technique. COMPARISON:  09/03/2020 FINDINGS: Brain: No evidence of acute infarction, hemorrhage, hydrocephalus, extra-axial collection or mass lesion/mass effect. Chronic atrophic and ischemic changes are noted similar to that seen on the prior exam. Vascular: No hyperdense vessel or unexpected calcification. Skull: Normal. Negative for fracture or focal lesion. Sinuses/Orbits: No acute finding. Other: None. IMPRESSION: Chronic atrophic and ischemic changes.  No acute abnormality noted. Electronically Signed   By: Inez Catalina M.D.   On: 11/24/2021 19:25  ? ?MR ANGIO HEAD WO CONTRAST ? ?Result Date: 11/25/2021 ?CLINICAL DATA:  Stroke, follow up EXAM: MRA HEAD WITHOUT CONTRAST TECHNIQUE: Angiographic images of the Circle of Willis were acquired using MRA technique without intravenous contrast. COMPARISON:  MRI November 24, 2021. FINDINGS: Motion limited study. Anterior circulation: Bilateral intracranial ICAs, MCAs, and ACAs are patent without proximal hemodynamically significant stenosis. No aneurysm identified. Posterior circulation: Left dominant intradural vertebral artery. Right intradural vertebral artery appears to terminate as PICA, anatomic variant. Left intradural vertebral artery, basilar artery, and bilateral posterior cerebral arteries are patent without proximal hemodynamically significant stenosis. Right posterior communicating  arteries present. No aneurysm identified. Anatomic variants: Detailed above. IMPRESSION: No large vessel occlusion or evidence of proximal hemodynamically significant stenosis on this motion limited study. Electronically Signed   By: Margaretha Sheffield M.D.   On: 11/25/2021 11:35  ? ?MR BRAIN WO CONTRAST ? ?Result Date: 11/24/2021 ?CLINICAL DATA:  Confusion slurred speech EXAM: MRI  HEAD WITHOUT CONTRAST TECHNIQUE: Multiplanar, multiecho pulse sequences of the brain and surrounding structures were obtained without intravenous contrast. COMPARISON:  No prior MRI, correlation is made with CT head 11/24/2021 FINDINGS: Brain: Focal area of restricted diffusion with ADC correlate in the right corona radiata, which measures up to 11 x 17 x 7 mm (series 5, image 80 and series 7, image 51), consistent with acute infarct. No other areas of restricted diffusion. No definite acute hemorrhage, although multiple areas of hemosiderin deposition are seen in the bilateral cerebral and right cerebellar hemisphere, likely related to prior infarcts. Confluent T2 hyperintense signal in the periventricular white matter, with multiple lacunar infarcts, likely the sequela of severe chronic small vessel ischemic disease. Degree of cerebral atrophy is advanced for age. Additional lacunar infarcts in the bilateral thalami, pons, and left basal ganglia. No mass, mass effect, or midline shift. No hydrocephalus or extra-axial collection. Vascular: Normal flow voids. Skull and upper cervical spine: Normal marrow signal. Sinuses/Orbits: Orbits are unremarkable. Mild mucosal thickening in the ethmoid air cells. Other: Fluid in left mastoid air cells. IMPRESSION: 1. Acute infarct in the right corona radiata. 2. Advanced cerebral atrophy for age, with sequela of severe chronic small vessel ischemic disease. These results were called by telephone at the time of interpretation on 11/24/2021 at 9:19 pm to provider DAVID YAO , who verbally acknowledged these results. Electronically Signed   By: Merilyn Baba M.D.   On: 11/24/2021 21:20  ? ?DG Chest Portable 1 View ? ?Result Date: 11/24/2021 ?CLINICAL DATA:  Altered mental status. EXAM: PORTABLE CHEST 1 VIEW COMPARISON:  November 12, 2021 FINDINGS: The heart size and mediastinal contours are within normal limits. Low lung volumes are seen with mild crowding of the bronchovascular lung  markings. Both lungs are otherwise clear. Radiopaque surgical clips are seen within the right upper quadrant. The visualized skeletal structures are unremarkable. IMPRESSION: Low lung volumes without evidence of acute or active cardiopulmonary disease. Electronically Signed   By: Virgina Norfolk M.D.   On: 11/24/2021 19:05  ? ?ECHOCARDIOGRAM COMPLETE ? ?Result Date: 11/25/2021 ?   ECHOCARDIOGRAM REPORT   Patient Name:   Gilbert Wilson Date of Exam: 11/25/2021 Medical Rec #:  [id number]       Height:       74.0 in Accession #:    [id number]      Weight:       200.0 lb Date of Birth:  09-04-1945       BSA:          2.173 m? Patient Age:    76 years        BP:           140/58 mmHg Patient Gender: M               HR:           62 bpm. Exam Location:  Inpatient Procedure: 2D Echo, Cardiac Doppler and Color Doppler Indications:    I63.9 STROKE  History:        Patient has prior history of Echocardiogram examinations, most  recent 07/05/2021. Arrythmias:Tachycardia; Risk                 Factors:Hypertension and Diabetes. CVA.  Sonographer:    Beryle Beams Referring Phys: [id number] [place]  1. Mild LVH with additional proximal septal thickening.  2. Left ventricular ejection fraction, by estimation, is 60 to 65%. The left ventricle has normal function. The left ventricle has no regional wall motion abnormalities. There is mild left ventricular hypertrophy. Left ventricular diastolic parameters are consistent with Grade I diastolic dysfunction (impaired relaxation).  3. Right ventricular systolic function is normal. The right ventricular size is normal.  4. Left atrial size was mildly dilated.  5. The mitral valve is normal in structure. Trivial mitral valve regurgitation. No evidence of mitral stenosis.  6. The aortic valve is tricuspid. Aortic valve regurgitation is mild. Aortic valve sclerosis/calcification is present, without any evidence of aortic stenosis.  7. The inferior vena cava  is normal in size with greater than 50% respiratory variability, suggesting right atrial pressure of 3 mmHg. FINDINGS  Left Ventricle: Left ventricular ejection fraction, by estimation, is 60 to 65%. The left ve

## 2021-11-25 NOTE — Assessment & Plan Note (Signed)
Hx hep c, cirrhosis ?s/p liver transplant ?Continue cyclosporine and mycophenolate ?

## 2021-11-25 NOTE — ED Notes (Signed)
Unable to give the keppra  iv not running  iv team contacted ?

## 2021-11-25 NOTE — Plan of Care (Signed)
  Problem: Education: Goal: Knowledge of General Education information will improve Description: Including pain rating scale, medication(s)/side effects and non-pharmacologic comfort measures Outcome: Progressing   Problem: Health Behavior/Discharge Planning: Goal: Ability to manage health-related needs will improve Outcome: Progressing   Problem: Clinical Measurements: Goal: Ability to maintain clinical measurements within normal limits will improve Outcome: Progressing Goal: Will remain free from infection Outcome: Progressing Goal: Diagnostic test results will improve Outcome: Progressing Goal: Respiratory complications will improve Outcome: Progressing Goal: Cardiovascular complication will be avoided Outcome: Progressing   Problem: Activity: Goal: Risk for activity intolerance will decrease Outcome: Progressing   Problem: Nutrition: Goal: Adequate nutrition will be maintained Outcome: Progressing   Problem: Coping: Goal: Level of anxiety will decrease Outcome: Progressing   Problem: Elimination: Goal: Will not experience complications related to bowel motility Outcome: Progressing Goal: Will not experience complications related to urinary retention Outcome: Progressing   Problem: Pain Managment: Goal: General experience of comfort will improve Outcome: Progressing   Problem: Safety: Goal: Ability to remain free from injury will improve Outcome: Progressing   Problem: Skin Integrity: Goal: Risk for impaired skin integrity will decrease Outcome: Progressing   Problem: Education: Goal: Knowledge of disease or condition will improve Outcome: Progressing Goal: Knowledge of secondary prevention will improve (SELECT ALL) Outcome: Progressing Goal: Knowledge of patient specific risk factors will improve (INDIVIDUALIZE FOR PATIENT) Outcome: Progressing Goal: Individualized Educational Video(s) Outcome: Progressing   Problem: Coping: Goal: Will verbalize  positive feelings about self Outcome: Progressing Goal: Will identify appropriate support needs Outcome: Progressing   Problem: Health Behavior/Discharge Planning: Goal: Ability to manage health-related needs will improve Outcome: Progressing   Problem: Self-Care: Goal: Ability to participate in self-care as condition permits will improve Outcome: Progressing Goal: Verbalization of feelings and concerns over difficulty with self-care will improve Outcome: Progressing Goal: Ability to communicate needs accurately will improve Outcome: Progressing   Problem: Nutrition: Goal: Risk of aspiration will decrease Outcome: Progressing   Problem: Ischemic Stroke/TIA Tissue Perfusion: Goal: Complications of ischemic stroke/TIA will be minimized Outcome: Progressing   

## 2021-11-25 NOTE — Progress Notes (Signed)
? ?  Echocardiogram ?2D Echocardiogram has been performed. ? ?Gilbert Wilson ?11/25/2021, 8:29 AM ?

## 2021-11-25 NOTE — Evaluation (Signed)
Clinical/Bedside Swallow Evaluation ?Patient Details  ?Name: Gilbert Wilson ?MRN: [id number] ?Date of Birth: 10-Aug-1946 ? ?Today's Date: 11/25/2021 ?Time: SLP Start Time (ACUTE ONLY): 1010 SLP Stop Time (ACUTE ONLY): 1030 ?SLP Time Calculation (min) (ACUTE ONLY): 20 min ? ?Past Medical History:  ?Past Medical History:  ?Diagnosis Date  ? CVA (cerebral vascular accident) ([place]) 2019  ? Dementia ([place])   ? 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 treated w/ procedure  ? Hepatitis C   ? HTN (hypertension)   ? Kidney failure   ? Liver failure ([place])   ? Seizure ([place])   ? Tachycardia requiring ablation 2017  ? 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 ?Past Surgical History:  ?Past Surgical History:  ?Procedure Laterality Date  ? KIDNEY TRANSPLANT  2016  ? same time as liver  ? LIVER TRANSPLANT N/A 2016  ? SVT ABLATION  2017  ? ?HPI:  ?76 y.o. 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  Gilbert Wilson lives at home with his wife and family who provide 24 hour support.  ?  ?Assessment / Plan / Recommendation  ?Clinical Impression ? Pt much more alert and interactive today per Mrs. Rosana Berger, who was at bedside. [name] presents with a functional oropharyngeal swallow with slowed but effective mastication, mild decreased labial seal right side of mouth, no s/s of aspiration with sequential sips of water, with and without a straw.  He appears to be protecting his airway. Recommend resuming a regular solid diet (carb modified), thin liquids; give meds with water (if he coughs, administer with puree).  No SLP f/u for swallowing is needed. ?SLP Visit Diagnosis: Dysphagia, unspecified (R13.10) ?   ?Aspiration Risk ? No limitations  ?  ?Diet Recommendation   Carb modified, thin liquids ? ?Medication Administration: Whole meds with liquid  ?  ?Other  Recommendations Oral Care Recommendations: Oral care BID   ? ?Recommendations for follow up therapy are one component of a multi-disciplinary discharge planning process, led by the attending physician.  Recommendations may be updated based on patient status, additional functional criteria and insurance authorization. ? ?Follow up Recommendations No SLP follow up  ? ? ?  ? ? ?Swallow Study   ?General HPI: 76 y.o. 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  Gilbert Wilson lives at home with his wife and family who provide 24 hour support. ?Type of Study: Bedside Swallow Evaluation ?Previous Swallow Assessment: none per records ?Diet Prior to this Study: NPO ?Temperature Spikes Noted: No ?Respiratory Status: Room air ?History of Recent Intubation: No ?Behavior/Cognition: Alert;Cooperative ?Oral Cavity Assessment: Within Functional Limits ?Oral Cavity - Dentition: Dentures, top;Dentures, bottom ?Vision: Functional for self-feeding ?Self-Feeding Abilities: Needs assist ?Patient Positioning: Upright in bed ?Baseline Vocal Quality: Normal ?Volitional Cough: Strong ?Volitional Swallow: Able to elicit  ?  ?Oral/Motor/Sensory  Function Overall Oral Motor/Sensory Function: Mild impairment   ?Ice Chips Ice chips: Within functional limits   ?Thin Liquid Thin Liquid: Within  functional limits  ?  ?Nectar Thick Nectar Thick Liquid: Not tested   ?Honey Thick Honey Thick Liquid: Not tested   ?Puree Puree: Within functional limits   ?Solid ? ? ?  Solid: Within functional limits  ? ?  ? ?Gilbert Wilson ?11/25/2021,11:29 AM ? ? ?[name], MA CCC/SLP ?[facility] Services ?Office number [phone] ?Pager [phone] ? ? ?

## 2021-11-25 NOTE — Progress Notes (Signed)
?PROGRESS NOTE ? ? ? ?Gilbert Wilson  PYP:[id number] DOB: 02-Jun-1946 DOA: 11/24/2021 ?PCP: [facility]  ?Chief Complaint  ?Patient presents with  ? Altered Mental Status  ? ? ?Brief Narrative:  ? 76 y.o. male with medical history significant of symptomatic bradycardia, prior CVA with right-sided hemiplegia, dementia, hepatic artery aneurysm status post repair, chronic hepatitis C, cirrhosis, liver failure status post liver transplant, ESRD status post renal transplant, hypertension, diabetes, seizure disorder, BPH, Donica Derouin-fib status post ablation, anemia presenting with altered mental status and found to have an acute stroke. ? ?He's been admitted for stroke workup, neurology following. ? ?See below for additional details  ? ? ?Assessment & Plan: ?  ?Principal Problem: ?  Acute CVA (cerebrovascular accident) ([place]) ?Active Problems: ?  Pyuria ?  History of renal transplant ?  Liver transplant recipient [facility]) ?  Essential hypertension ?  Anemia ?  Type 2 diabetes mellitus ([place]) ?  Seizure ([place]) ?  Benign prostatic hyperplasia with lower urinary tract symptoms ?  Dementia ([place]) ?  Atrial fibrillation ([place]) ?  Posttraumatic stress disorder ?  Obstructive sleep apnea of adult ?  Hemiplegia, unspecified affecting unspecified side ([place]) ? ? ?Assessment and Plan: ?* Acute CVA (cerebrovascular accident) ([place]) ?Hx prior stroke 2019 with R sided weakness ?Presenting now with confusion and slurred speech for Rhylen Shaheen few days  ?Head CT with chronic atrophy and ischemic changes ?MRI brain with acute infarct in right corona radiata ?Carotid US pending  ?MRA head pending  ?Echo pending ?Telemetry ?PT/OT/SLP ?LDL 50, continue crestor ?A1c 6.5 ?Appreciate neurology recommendings -> add ASA to plavix for DAPT (failed plavix monotherapy) ? ? ? ? ?Pyuria ?Has indwelling foley, was recently treated for UTI ?Follow repeat urine culture -> will plan to exchange foley and treat if positive ? ? ? ?Liver transplant recipient [facility]) ?Hx hep c,  cirrhosis ?s/p liver transplant ?Continue cyclosporine and mycophenolate ? ?History of renal transplant ?Continue myfortic, cyclosporine  ?Creatinine is 1.9 which appears around his baseline ?Continue calcitriol, bicarb ? ?Essential hypertension ?BP elevated, fluctuating  ?Home hydral, losartan on hold with permissive hypotension ? ? ?Anemia ?Likely related to CKD ?Follow anemia labs ? ?Seizure ([place]) ?Continue keppra ? ?Type 2 diabetes mellitus ([place]) ?lantus at home (on sliding scale?) ?SSI aspart ? ?Benign prostatic hyperplasia with lower urinary tract symptoms ?Finasteride ?Has chronic indwelling foley  ? ?Dementia ([place]) ?Delirium preacutions ? ?Atrial fibrillation ([place]) ?S/p ablation ? ? ?DVT prophylaxis: lovenox ?Code Status: full ?Family Communication: wife at bedside ?Disposition:  ? ?Status is: Observation ?The patient will require care spanning > 2 midnights and should be moved to inpatient because: need for stroke w/u, neurology eval ?  ?Consultants:  ?neurology ? ?Procedures:  ?none ? ?Antimicrobials:  ?Anti-infectives (From admission, onward)  ? ? None  ? ?  ? ? ?Subjective: ?No new complaints ?Gilbert Wilson&Ox1  ? ?Objective: ?Vitals:  ? 11/25/21 0600 11/25/21 0630 11/25/21 0653 11/25/21 0700  ?BP: 123/62 (!) 168/81  (!) 140/58  ?Pulse: (!) 39 65  63  ?Resp: 13 (!) 27  18  ?Temp:   98.4 ?F (36.9 ?C)   ?TempSrc:      ?SpO2: 99% 100%  100%  ?Weight:      ?Height:      ? ?No intake or output data in the 24 hours ending 11/25/21 0932 ?Filed Weights  ? 11/24/21 1817  ?Weight: 90.7 kg  ? ? ?Examination: ? ?General exam: Appears calm and comfortable  ?Respiratory system: unlabored ?Cardiovascular system: RRR ?Gastrointestinal  system: Abdomen is nondistended, soft and nontender ?Central nervous system: minimal, dysarthric speech, Gilbert Wilson&Ox1 (knows hospital, but not which one, doesn't know date - baseline per wife), chronic right sided weakness (contracture to RUE, able to squeeze my hand on R, able to lift R heel off  bed). ?Extremities: no LEE ?Skin: No rashes, lesions or ulcers ?Psychiatry: Judgement and insight appear normal. Mood & affect appropriate.  ? ? ? ?Data Reviewed: I have personally reviewed following labs and imaging studies ? ?CBC: ?Recent Labs  ?Lab 11/24/21 ?1834  ?WBC 5.9  ?NEUTROABS 3.8  ?HGB 11.3*  ?HCT 35.9*  ?MCV 76.1*  ?PLT 255  ? ? ?Basic Metabolic Panel: ?Recent Labs  ?Lab 11/24/21 ?1834  ?NA 140  ?K 3.9  ?CL 111  ?CO2 21*  ?GLUCOSE 147*  ?BUN 40*  ?CREATININE 1.90*  ?CALCIUM 9.9  ? ? ?GFR: ?Estimated Creatinine Clearance: 39.7 mL/min (Vedh Ptacek) (by C-G formula based on SCr of 1.9 mg/dL (H)). ? ?Liver Function Tests: ?Recent Labs  ?Lab 11/24/21 ?1834  ?AST 21  ?ALT 21  ?ALKPHOS 66  ?BILITOT 0.9  ?PROT 6.8  ?ALBUMIN 3.5  ? ? ?CBG: ?Recent Labs  ?Lab 11/25/21 ?0200 11/25/21 ?0701  ?GLUCAP 140* 133*  ? ? ? ?Recent Results (from the past 240 hour(s))  ?Resp Panel by RT-PCR (Flu Edouard Gikas&B, Covid) Nasopharyngeal Swab     Status: None  ? Collection Time: 11/24/21  6:54 PM  ? Specimen: Nasopharyngeal Swab; Nasopharyngeal(NP) swabs in vial transport medium  ?Result Value Ref Range Status  ? SARS Coronavirus 2 by RT PCR NEGATIVE NEGATIVE Final  ?  Comment: (NOTE) ?SARS-CoV-2 target nucleic acids are NOT DETECTED. ? ?The SARS-CoV-2 RNA is generally detectable in upper respiratory ?specimens during the acute phase of infection. The lowest ?concentration of SARS-CoV-2 viral copies this assay can detect is ?138 copies/mL. Tamesha Ellerbrock negative result does not preclude SARS-Cov-2 ?infection and should not be used as the sole basis for treatment or ?other patient management decisions. Kynsli Haapala negative result may occur with  ?improper specimen collection/handling, submission of specimen other ?than nasopharyngeal swab, presence of viral mutation(s) within the ?areas targeted by this assay, and inadequate number of viral ?copies(<138 copies/mL). Denine Brotz negative result must be combined with ?clinical observations, patient history, and  epidemiological ?information. The expected result is Negative. ? ?Fact Sheet for Patients:  ?EntrepreneurPulse.com.au ? ?Fact Sheet for Healthcare Providers:  ?IncredibleEmployment.be ? ?This test is no t yet approved or cleared by the [place] FDA and  ?has been authorized for detection and/or diagnosis of SARS-CoV-2 by ?FDA under an Emergency Use Authorization (EUA). This EUA will remain  ?in effect (meaning this test can be used) for the duration of the ?COVID-19 declaration under Section 564(b)(1) of the Act, 21 ?U.S.C.section 360bbb-3(b)(1), unless the authorization is terminated  ?or revoked sooner.  ? ? ?  ? Influenza Myleah Cavendish by PCR NEGATIVE NEGATIVE Final  ? Influenza B by PCR NEGATIVE NEGATIVE Final  ?  Comment: (NOTE) ?The Xpert Xpress SARS-CoV-2/FLU/RSV plus assay is intended as an aid ?in the diagnosis of influenza from Nasopharyngeal swab specimens and ?should not be used as Danalee Flath sole basis for treatment. Nasal washings and ?aspirates are unacceptable for Xpert Xpress SARS-CoV-2/FLU/RSV ?testing. ? ?Fact Sheet for Patients: ?EntrepreneurPulse.com.au ? ?Fact Sheet for Healthcare Providers: ?IncredibleEmployment.be ? ?This test is not yet approved or cleared by the [place] FDA and ?has been authorized for detection and/or diagnosis of SARS-CoV-2 by ?FDA under an Emergency Use Authorization (EUA). This EUA will remain ?in  effect (meaning this test can be used) for the duration of the ?COVID-19 declaration under Section 564(b)(1) of the Act, 21 U.S.C. ?section 360bbb-3(b)(1), unless the authorization is terminated or ?revoked. ? ?Performed at [facility], [place] [address] [place], [place] ?[address] ?  ?  ? ? ? ? ? ?Radiology Studies: ?CT Head Wo Contrast ? ?Result Date: 11/24/2021 ?CLINICAL DATA:  Increasing confusion and slurred speech EXAM: CT HEAD WITHOUT CONTRAST TECHNIQUE: Contiguous axial images were obtained from the base  of the skull through the vertex without intravenous contrast. RADIATION DOSE REDUCTION: This exam was performed according to the departmental dose-optimization program which includes automated exposure control, adjustment of the mA and

## 2021-11-25 NOTE — Assessment & Plan Note (Signed)
Delirium preacutions ?

## 2021-11-25 NOTE — Hospital Course (Addendum)
76 y.o. male with medical history significant of symptomatic bradycardia, prior CVA with right-sided hemiplegia, dementia, hepatic artery aneurysm status post repair, chronic hepatitis C, cirrhosis, liver failure status post liver transplant, ESRD status post renal transplant, hypertension, diabetes, seizure disorder, BPH, Rainee Sweatt-fib status post ablation, anemia presenting with altered mental status and found to have an acute stroke. ? ?He's been admitted for stroke workup, neurology following.  Neurology recommended DAPTx3 weeks followed by aspirin alone.  He'll go home with family at time of discharge, suspect 3/29.  ? ?See below for additional details ?

## 2021-11-25 NOTE — Evaluation (Signed)
Physical Therapy Evaluation ?Patient Details ?Name: Gilbert Wilson ?MRN: [id number] ?DOB: 10-02-1945 ?Today's Date: 11/25/2021 ? ?History of Present Illness ? Pt is a 76 yo M presenting to ED 11/24/21 for increased confusion and slurred speech x2-3 days. Imaging (+) for acute R corona radiata infarct and low lung volumes. PMH includes recurrent UTIs, dementia, HTN, DM2, kidney failure s/p kidney and liver transplant, L CVA with R sided deficits, and seizures (on Keppra).  ?Clinical Impression ? Pt admitted secondary to problem above with deficits below. Pt requiring mod A to roll from side to side for linen change. Pt's daughter and wife present and they report pt is normally bed bound and when he does transfer to [facility], daughter/son provide significant assist. Wife and daughter reports they are interested in having HHPT and would like to see if they could maintain for longer term. Will defer further needs to HHPT. No further acute skilled PT needs. Will sign off. If needs change, please re-consult.    ?   ? ?Recommendations for follow up therapy are one component of a multi-disciplinary discharge planning process, led by the attending physician.  Recommendations may be updated based on patient status, additional functional criteria and insurance authorization. ? ?Follow Up Recommendations Home health PT ? ?  ?Assistance Recommended at Discharge Frequent or constant Supervision/Assistance  ?Patient can return home with the following ? A lot of help with walking and/or transfers;A lot of help with bathing/dressing/bathroom;Help with stairs or ramp for entrance;Direct supervision/assist for medications management;Direct supervision/assist for financial management;Assist for transportation;Assistance with cooking/housework ? ?  ?Equipment Recommendations None recommended by PT (Pt's family reports they are waiting for their high back WC to arrive)  ?Recommendations for Other Services ?    ?  ?Functional Status Assessment  Patient has had a recent decline in their functional status and demonstrates the ability to make significant improvements in function in a reasonable and predictable amount of time.  ? ?  ?Precautions / Restrictions Precautions ?Precautions: Fall ?Restrictions ?Weight Bearing Restrictions: No  ? ?  ? ?Mobility ? Bed Mobility ?Overal bed mobility: Needs Assistance ?Bed Mobility: Rolling ?Rolling: Mod assist ?  ?  ?  ?  ?General bed mobility comments: Mod A for rolling side to side to change linens. Pt able to grab to the rails to assist. ?  ? ?Transfers ?  ?  ?  ?  ?  ?  ?  ?  ?  ?  ?  ? ?Ambulation/Gait ?  ?  ?  ?  ?  ?  ?  ?  ? ?Stairs ?  ?  ?  ?  ?  ? ?Wheelchair Mobility ?  ? ?Modified Rankin (Stroke Patients Only) ?  ? ?  ? ?Balance   ?  ?  ?  ?  ?  ?  ?  ?  ?  ?  ?  ?  ?  ?  ?  ?  ?  ?  ?   ? ? ? ?Pertinent Vitals/Pain Pain Assessment ?Pain Assessment: No/denies pain  ? ? ?Home Living Family/patient expects to be discharged to:: Private residence ?Living Arrangements: Spouse/significant other;Children ?Available Help at Discharge: Family;Available 24 hours/day ?Type of Home: House ?Home Access: Level entry ?  ?  ?  ?Home Layout: One level ?Home Equipment: Wheelchair - manual;Hospital bed;Other (comment) (hoyer lift with pad) ?   ?  ?Prior Function Prior Level of Function : Needs assist ?  ?  ?  ?  ?  ?  ?  Mobility Comments: Primarily bed bound. When pt has to transfer to wc, daughter and son provide assist. ?ADLs Comments: Wife/family assist with all ADLs. Bed baths. Incontinent. ?  ? ? ?Hand Dominance  ?   ? ?  ?Extremity/Trunk Assessment  ? Upper Extremity Assessment ?Upper Extremity Assessment: Defer to OT evaluation ?  ? ?Lower Extremity Assessment ?Lower Extremity Assessment: Generalized weakness ?  ? ?   ?Communication  ? Communication: Expressive difficulties  ?Cognition Arousal/Alertness: Awake/alert ?Behavior During Therapy: [facility] for tasks assessed/performed ?Overall Cognitive Status: History of  cognitive impairments - at baseline ?  ?  ?  ?  ?  ?  ?  ?  ?  ?  ?  ?  ?  ?  ?  ?  ?General Comments: Dementia at baseline ?  ?  ? ?  ?General Comments General comments (skin integrity, edema, etc.): Pt's daughter and wife present and supportive. Reports they would like for HHPT to come in. ? ?  ?Exercises    ? ?Assessment/Plan  ?  ?PT Assessment All further PT needs can be met in the next venue of care;Patient does not need any further PT services  ?PT Problem List Decreased strength;Decreased balance;Decreased mobility;Decreased cognition;Decreased knowledge of use of DME;Decreased safety awareness;Decreased knowledge of precautions ? ?   ?  ?PT Treatment Interventions     ? ?PT Goals (Current goals can be found in the Care Plan section)  ?Acute Rehab PT Goals ?Patient Stated Goal: to go home ?PT Goal Formulation: With patient/family ?Time For Goal Achievement: 11/25/21 ?Potential to Achieve Goals: Fair ? ?  ?Frequency   ?  ? ? ?Co-evaluation   ?  ?  ?  ?  ? ? ?  ?AM-PAC PT "6 Clicks" Mobility  ?Outcome Measure Help needed turning from your back to your side while in a flat bed without using bedrails?: A Lot ?Help needed moving from lying on your back to sitting on the side of a flat bed without using bedrails?: A Lot ?Help needed moving to and from a bed to a chair (including a wheelchair)?: A Lot ?Help needed standing up from a chair using your arms (e.g., wheelchair or bedside chair)?: Total ?Help needed to walk in hospital room?: Total ?Help needed climbing 3-5 steps with a railing? : Total ?6 Click Score: 9 ? ?  ?End of Session Equipment Utilized During Treatment: Gait belt ?Activity Tolerance: Patient tolerated treatment well ?Patient left: in bed;with call bell/phone within reach (on stretcher in ED) ?Nurse Communication: Mobility status ?PT Visit Diagnosis: Other abnormalities of gait and mobility (R26.89) ?  ? ?Time: 6295-2841 ?PT Time Calculation (min) (ACUTE ONLY): 18 min ? ? ?Charges:   PT  Evaluation ?$PT Eval Moderate Complexity: 1 Mod ?  ?  ?   ? ? ?Reuel Derby, PT, DPT  ?Acute Rehabilitation Services  ?Pager: [phone] ?Office: [phone] ? ? ?[place] ?11/25/2021, 3:46 PM ?

## 2021-11-25 NOTE — Assessment & Plan Note (Addendum)
Has indwelling foley, was recently treated for UTI ?Follow repeat urine culture - no growth  ? ? ?

## 2021-11-25 NOTE — Progress Notes (Signed)
PT Cancellation Note ? ?Patient Details ?Name: Gilbert Wilson ?MRN: [id number] ?DOB: August 28, 1946 ? ? ?Cancelled Treatment:    Reason Eval/Treat Not Completed: Patient at procedure or test/unavailable. Pt at MRI. PT will follow up as schedule allows.  ? ?Jonne Ply, SPT ? ?Jonne Ply ?11/25/2021, 11:42 AM ? ? ?

## 2021-11-25 NOTE — Assessment & Plan Note (Signed)
Likely related to CKD ?Follow anemia labs ?

## 2021-11-25 NOTE — Assessment & Plan Note (Signed)
lantus at home (on sliding scale?) ?SSI aspart ?

## 2021-11-25 NOTE — Assessment & Plan Note (Signed)
BP elevated, fluctuating  ?Home hydral, losartan on hold with permissive hypotension ? ?

## 2021-11-25 NOTE — Progress Notes (Signed)
OT Cancellation Note ? ?Patient Details ?Name: Gilbert Wilson ?MRN: [id number] ?DOB: 11-Oct-1945 ? ? ?Cancelled Treatment:    Reason Eval/Treat Not Completed: Patient at procedure or test/ unavailable- pt off unit at MRI. Will follow and see as able. ? ?Jolaine Artist, OT ?Acute Rehabilitation Services ?Pager [phone] ?Office [phone] ? ? ?[place] ?11/25/2021, 11:43 AM ?

## 2021-11-25 NOTE — Assessment & Plan Note (Signed)
S/p ablation 

## 2021-11-25 NOTE — Progress Notes (Signed)
VASCULAR LAB ? ? ? ?Carotid duplex has been performed. ? ?See CV proc for preliminary results. ? ? ?Manuelito Poage, RVT ?11/25/2021, 9:11 AM ? ?

## 2021-11-26 DIAGNOSIS — I639 Cerebral infarction, unspecified: Secondary | ICD-10-CM | POA: Diagnosis not present

## 2021-11-26 LAB — CBC WITH DIFFERENTIAL/PLATELET
Abs Immature Granulocytes: 0.02 10*3/uL (ref 0.00–0.07)
Basophils Absolute: 0.1 10*3/uL (ref 0.0–0.1)
Basophils Relative: 2 %
Eosinophils Absolute: 0.1 10*3/uL (ref 0.0–0.5)
Eosinophils Relative: 2 %
HCT: 31.4 % — ABNORMAL LOW (ref 39.0–52.0)
Hemoglobin: 9.7 g/dL — ABNORMAL LOW (ref 13.0–17.0)
Immature Granulocytes: 0 %
Lymphocytes Relative: 28 %
Lymphs Abs: 1.5 10*3/uL (ref 0.7–4.0)
MCH: 23.5 pg — ABNORMAL LOW (ref 26.0–34.0)
MCHC: 30.9 g/dL (ref 30.0–36.0)
MCV: 76.2 fL — ABNORMAL LOW (ref 80.0–100.0)
Monocytes Absolute: 0.7 10*3/uL (ref 0.1–1.0)
Monocytes Relative: 13 %
Neutro Abs: 2.9 10*3/uL (ref 1.7–7.7)
Neutrophils Relative %: 55 %
Platelets: 254 10*3/uL (ref 150–400)
RBC: 4.12 MIL/uL — ABNORMAL LOW (ref 4.22–5.81)
RDW: 16.8 % — ABNORMAL HIGH (ref 11.5–15.5)
WBC: 5.3 10*3/uL (ref 4.0–10.5)
nRBC: 0 % (ref 0.0–0.2)

## 2021-11-26 LAB — GLUCOSE, CAPILLARY
Glucose-Capillary: 103 mg/dL — ABNORMAL HIGH (ref 70–99)
Glucose-Capillary: 104 mg/dL — ABNORMAL HIGH (ref 70–99)
Glucose-Capillary: 136 mg/dL — ABNORMAL HIGH (ref 70–99)
Glucose-Capillary: 144 mg/dL — ABNORMAL HIGH (ref 70–99)
Glucose-Capillary: 175 mg/dL — ABNORMAL HIGH (ref 70–99)
Glucose-Capillary: 94 mg/dL (ref 70–99)

## 2021-11-26 LAB — URINE CULTURE: Culture: NO GROWTH

## 2021-11-26 LAB — COMPREHENSIVE METABOLIC PANEL
ALT: 18 U/L (ref 0–44)
AST: 16 U/L (ref 15–41)
Albumin: 3.1 g/dL — ABNORMAL LOW (ref 3.5–5.0)
Alkaline Phosphatase: 53 U/L (ref 38–126)
Anion gap: 8 (ref 5–15)
BUN: 37 mg/dL — ABNORMAL HIGH (ref 8–23)
CO2: 19 mmol/L — ABNORMAL LOW (ref 22–32)
Calcium: 9.6 mg/dL (ref 8.9–10.3)
Chloride: 113 mmol/L — ABNORMAL HIGH (ref 98–111)
Creatinine, Ser: 1.96 mg/dL — ABNORMAL HIGH (ref 0.61–1.24)
GFR, Estimated: 35 mL/min — ABNORMAL LOW (ref >60)
Glucose, Bld: 117 mg/dL — ABNORMAL HIGH (ref 70–99)
Potassium: 3.9 mmol/L (ref 3.5–5.1)
Sodium: 140 mmol/L (ref 135–145)
Total Bilirubin: 0.9 mg/dL (ref 0.3–1.2)
Total Protein: 6.4 g/dL — ABNORMAL LOW (ref 6.5–8.1)

## 2021-11-26 LAB — MAGNESIUM: Magnesium: 1.8 mg/dL (ref 1.7–2.4)

## 2021-11-26 LAB — PHOSPHORUS: Phosphorus: 3.2 mg/dL (ref 2.5–4.6)

## 2021-11-26 NOTE — TOC Initial Note (Signed)
Transition of Care (TOC) - Initial/Assessment Note  ? ? ?Patient Details  ?Name: Gilbert Wilson ?MRN: [id number] ?Date of Birth: 1945/12/05 ? ?Transition of Care (TOC) CM/SW Contact:    ?Carles Collet, RN ?Phone Number: ?11/26/2021, 12:09 PM ? ?Clinical Narrative:        ?Spoke w wife at bedside.  ?She states that patient has hoyer, bed and WC at home, denies need for additional DME. ?Patient will need PTAR at DC.  ?Discussed [place] services.  ?[facility] chosen for [facility] as they have had them in the past. ?Spoke w [facility] rep and notified of need for [facility], and someone will be calling me back with number to fax orders to.  ?          ? ? ?Expected Discharge Plan: [place] ?Barriers to Discharge: Continued Medical Work up ? ? ?Patient Goals and CMS Choice ?Patient states their goals for this hospitalization and ongoing recovery are:: to return home ?CMS Medicare.gov Compare Post Acute Care list provided to:: Other (Comment Required) ?Choice offered to / list presented to : Spouse ? ?Expected Discharge Plan and Services ?Expected Discharge Plan: [place] ?  ?Discharge Planning Services: CM Consult ?Post Acute Care Choice: Home Health ?Living arrangements for the past 2 months: [place] ?                ?  ?  ?  ?  ?  ?HH Arranged: PT, OT ?[place] Agency: [place] ?Date HH Agency Contacted: 11/26/21 ?Time [place]: 1207 ?Representative spoke with at [place]: Tommi Rumps ? ?Prior Living Arrangements/Services ?Living arrangements for the past 2 months: [place] ?Lives with:: Spouse ?  ?Do you feel safe going back to the place where you live?: Yes      ?  ?  ?Current home services: DME ?  ? ?Activities of Daily Living ?  ?  ? ?Permission Sought/Granted ?  ?  ?   ?   ?   ?   ? ?Emotional Assessment ?  ?  ?  ?  ?  ?  ? ?Admission diagnosis:  Stroke [facility]) [I63.9] ?Acute CVA (cerebrovascular accident) ([place]) [I63.9] ?Cerebrovascular accident (CVA), unspecified mechanism  ([place]) [I63.9] ?Patient Active Problem List  ? Diagnosis Date Noted  ? Dementia ([place]) 11/25/2021  ? Pyuria 11/25/2021  ? Type 2 diabetes mellitus ([place]) 11/24/2021  ? Stroke [facility]) 11/24/2021  ? Vitamin D deficiency 11/24/2021  ? Immunodeficiency, unspecified ([place]) 11/24/2021  ? Seizure ([place]) 11/24/2021  ? Sensorineural hearing loss 11/24/2021  ? Posttraumatic stress disorder 11/24/2021  ? Obstructive sleep apnea of adult 11/24/2021  ? 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 History of renal transplant 11/24/2021  ? Liver transplant status ([place]) 11/24/2021  ? Hemiplegia, unspecified affecting unspecified side ([place]) 11/24/2021  ? Essential hypertension 11/24/2021  ? End stage renal disease ([place]) 11/24/2021  ? Dysphagia, unspecified 11/24/2021  ? Chronic hepatitis C ([place]) 11/24/2021  ? Cirrhosis of liver ([place]) 11/24/2021  ? Cerebellar stroke syndrome 11/24/2021  ? Cardiomegaly 11/24/2021  ? Beta thalassemia trait 11/24/2021  ? Benign prostatic hyperplasia with lower urinary tract symptoms 11/24/2021  ? Atrial fibrillation ([place]) 11/24/2021  ? 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 Acute CVA (cerebrovascular accident) ([place]) 11/24/2021  ? Symptomatic bradycardia 07/04/2021  ? Anemia 05/21/2009  ? ?PCP:  [facility] ?Pharmacy:   ?[id number]  [place], [address] ?[address] ?[place] [address] ?Phone: [phone] Fax: [phone] ? ? ? ? ?Social Determinants of Health (SDOH) Interventions ?  ? ?Readmission Risk Interventions ?   ? View : No data to display.  ?  ?  ?  ? ? ? ?

## 2021-11-26 NOTE — Progress Notes (Signed)
Notified Dr. Nevada Crane of HR fluctuating 38-60s, afibb. When awakening him he states he denies any dizziness or other symptoms. Continuing to monitor.  ?

## 2021-11-26 NOTE — Evaluation (Signed)
Occupational Therapy Evaluation ?Patient Details ?Name: Gilbert Wilson ?MRN: [id number] ?DOB: 1946/03/14 ?Today's Date: 11/26/2021 ? ? ?History of Present Illness Pt is a 76 yo M presenting to ED 11/24/21 for increased confusion and slurred speech x2-3 days. Imaging (+) for acute R corona radiata infarct and low lung volumes. PMH includes recurrent UTIs, dementia, HTN, DM2, kidney failure s/p kidney and liver transplant, L CVA with R sided deficits, and seizures (on Keppra).  ? ?Clinical Impression ?  ?Pt seen for OT with his spouse present.  Currently, he is close to functional baseline with selfcare tasks as family assisted with all bathing and dressing from bed level.  He does need overall mod assist for self feeding using the LUE, which was at supervision PTA.  Spouse uses a Engineer, drilling for OOB and son/daughter transfer without at times.  Feel he will benefit from acute care OT to work on improving self feeding and initiation of task as he is currently delayed.  Will continue to follow.   ?   ? ?Recommendations for follow up therapy are one component of a multi-disciplinary discharge planning process, led by the attending physician.  Recommendations may be updated based on patient status, additional functional criteria and insurance authorization.  ? ?Follow Up Recommendations ? No OT follow up  ?  ?Assistance Recommended at Discharge Frequent or constant Supervision/Assistance  ?Patient can return home with the following A lot of help with walking and/or transfers;A lot of help with bathing/dressing/bathroom;Assistance with feeding;Assistance with cooking/housework;Help with stairs or ramp for entrance;Direct supervision/assist for medications management;Direct supervision/assist for financial management ? ?  ?Functional Status Assessment ? Patient has had a recent decline in their functional status and demonstrates the ability to make significant improvements in function in a reasonable and predictable amount of time.   ?Equipment Recommendations ? None recommended by OT  ?  ?Recommendations for Other Services   ? ? ?  ?Precautions / Restrictions Precautions ?Precautions: Fall ?Restrictions ?Weight Bearing Restrictions: No  ? ?  ? ?Mobility Bed Mobility ?Overal bed mobility: Needs Assistance ?Bed Mobility: Rolling ?Rolling: Mod assist ?  ?  ?  ?  ?General bed mobility comments: Supervision with min instructional cueing to roll to the right with use of the bedrail.  Mod assist for rolling to the left. ?  ? ?Transfers ?  ?  ?  ?  ?  ?  ?  ?  ?  ?General transfer comment: Pt declined attempt to get OOB this session. ?  ? ?  ?   ? ?ADL either performed or assessed with clinical judgement  ? ?ADL Overall ADL's : Needs assistance/impaired ?Eating/Feeding: Moderate assistance;Bed level ?  ?  ?  ?  ?  ?  ?  ?  ?  ?  ?  ?  ?  ?  ?  ?  ?  ?  ?General ADL Comments: Pt is baseline assist for bathing and dressing as family completed all of this at bed level.  He was able to roll right with supervision and instructional cueing to reach for the bed rail.  Mod assist was needed for rolling to the right which is his baseline as well.  Spouse reports he was able to self feed with just setup prior to this but during this session, he needed mod assist for bringing spoon to mouth for eating apple sauce including manipulation of the spoon.  May be indicative of motor planning, but will continue to assess further in treatment.  ? ? ? ?  Vision Baseline Vision/History: 0 No visual deficits ?Ability to See in Adequate Light: 0 Adequate ?Patient Visual Report: No change from baseline ?Vision Assessment?: No apparent visual deficits  ?   ?Perception Perception ?Perception: Within Functional Limits ?  ?Praxis Praxis ?Praxis: Impaired ?Praxis Impairment Details: Initiation ?Praxis-Other Comments: Pt with decreased initiation of self feeding compared to baseline. ?  ? ?Pertinent Vitals/Pain Pain Assessment ?Pain Assessment: No/denies pain  ? ? ? ?Hand Dominance    ?  ?Extremity/Trunk Assessment Upper Extremity Assessment ?Upper Extremity Assessment: RUE deficits/detail ?RUE Deficits / Details: Pt with gross digit flexion and extension in the hand.  Increased tightness and limitation with shoulder flexion and elbow flexion/extension.  PROM shoulder flexion 0-90 degrees  Hx of hemiparesis from CVA in 2019.  Pt primarily would use the LUE for self feeding tasks. ?RUE Sensation: WNL ?RUE Coordination: decreased fine motor;decreased gross motor ?  ?Lower Extremity Assessment ?Lower Extremity Assessment: Defer to PT evaluation ?  ?  ?  ?Communication Communication ?Communication: Expressive difficulties ?  ?Cognition Arousal/Alertness: Awake/alert ?Behavior During Therapy: [facility] for tasks assessed/performed ?Overall Cognitive Status: Impaired/Different from baseline ?Area of Impairment: Orientation, Following commands ?  ?  ?  ?  ?  ?  ?  ?  ?Orientation Level: Disoriented to, Place, Time, Situation ?  ?  ?Following Commands: Follows one step commands inconsistently ?  ?  ?  ?General Comments: Pt with some greater difficulty with initiation and completion of self feeding tasks. ?  ?  ?   ?   ?   ? ? ?Home Living Family/patient expects to be discharged to:: Private residence ?Living Arrangements: Spouse/significant other;Children ?Available Help at Discharge: Family;Available 24 hours/day ?Type of Home: House ?Home Access: Level entry ?  ?  ?Home Layout: One level ?  ?  ?Bathroom Shower/Tub: Walk-in shower ?  ?Bathroom Toilet: Standard ?  ?  ?Home Equipment: Wheelchair - manual;Hospital bed;Other (comment) (hoyer lift with pad) ?  ?  ? Lives With: Spouse;Family ? ?  ?Prior Functioning/Environment Prior Level of Function : Needs assist ? Cognitive Assist : ADLs (cognitive) ?  ?ADLs (Cognitive): Step by step cues ?  ?  ?  ?Mobility Comments: Primarily bed bound. When pt has to transfer to wc, daughter and son provide assist. Spouse uses Harrel Lemon, with son and daughter transferring him  sometimes squat pivot ?ADLs Comments: Wife/family assist with all ADLs. Bed baths. Incontinent. ?  ? ?  ?  ?OT Problem List: Decreased strength;Decreased coordination;Decreased cognition;Decreased knowledge of use of DME or AE ?  ?   ?OT Treatment/Interventions: Self-care/ADL training;DME and/or AE instruction;Cognitive remediation/compensation;Patient/family education  ?  ?OT Goals(Current goals can be found in the care plan section) Acute Rehab OT Goals ?Patient Stated Goal: Spouse wants him to get back to being able to feed himself. ?OT Goal Formulation: With patient/family ?Time For Goal Achievement: 12/10/21 ?Potential to Achieve Goals: Fair  ?OT Frequency: Min 2X/week ?  ? ?   ?AM-PAC OT "6 Clicks" Daily Activity     ?Outcome Measure Help from another person eating meals?: A Lot ?Help from another person taking care of personal grooming?: A Lot ?Help from another person toileting, which includes using toliet, bedpan, or urinal?: Total ?Help from another person bathing (including washing, rinsing, drying)?: Total ?Help from another person to put on and taking off regular upper body clothing?: Total ?Help from another person to put on and taking off regular lower body clothing?: Total ?6 Click Score: 8 ?  ?End of  Session Nurse Communication: Need for lift equipment ? ?Activity Tolerance: Patient tolerated treatment well ?Patient left: in bed;with call bell/phone within reach;with family/visitor present ? ?OT Visit Diagnosis: Muscle weakness (generalized) (M62.81);Feeding difficulties (R63.3);Other symptoms and signs involving cognitive function  ?              ?Time: 5956-3875 ?OT Time Calculation (min): 21 min ?Charges:  OT General Charges ?$OT Visit: 1 Visit ?OT Evaluation ?$OT Eval Moderate Complexity: 1 Mod ? ?Amor Hyle OTR/L ?11/26/2021, 10:24 AM ?

## 2021-11-26 NOTE — Progress Notes (Signed)
?PROGRESS NOTE ? ? ? ?Gilbert Wilson  JJO:[id number] DOB: Jun 30, 1946 DOA: 11/24/2021 ?PCP: [facility]  ?Chief Complaint  ?Patient presents with  ? Altered Mental Status  ? ? ?Brief Narrative:  ? 76 y.o. male with medical history significant of symptomatic bradycardia, prior CVA with right-sided hemiplegia, dementia, hepatic artery aneurysm status post repair, chronic hepatitis C, cirrhosis, liver failure status post liver transplant, ESRD status post renal transplant, hypertension, diabetes, seizure disorder, BPH, Gilbert Wilson-fib status post ablation, anemia presenting with altered mental status and found to have an acute stroke. ? ?He's been admitted for stroke workup, neurology following.  Neurology recommended DAPTx3 weeks followed by aspirin alone.  He'll go home with family at time of discharge, suspect 3/29.  ? ?See below for additional details  ? ? ?Assessment & Plan: ?  ?Principal Problem: ?  Acute CVA (cerebrovascular accident) ([place]) ?Active Problems: ?  Bradycardia ?  Pyuria ?  History of renal transplant ?  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  Essential hypertension ?  Anemia ?  Type 2 diabetes mellitus ([place]) ?  Seizure ([place]) ?  Benign prostatic hyperplasia with lower urinary tract symptoms ?  Dementia ([place]) ?  Atrial fibrillation ([place]) ?  Stroke [facility]) ?  Posttraumatic stress disorder ?  Obstructive sleep apnea of adult ?  Hemiplegia, unspecified affecting unspecified side ([place]) ? ? ?Assessment and Plan: ?* Acute CVA (cerebrovascular accident) ([place]) ?Hx prior stroke 2019 with R sided weakness ?Presenting now with confusion and slurred speech for Gilbert Wilson few days  ?Head CT with chronic atrophy and ischemic changes ?MRI brain with acute infarct in right corona radiata ?Carotid US pending  ?MRA head pending  ?Echo pending ?Telemetry ?PT/OT/SLP -> home health ?LDL 50, continue crestor ?A1c 6.5 ?Appreciate neurology recommendings -> add ASA to plavix for DAPT (failed plavix monotherapy).  Recommending DAPTx3 weeks,  then aspirin alone.  Continue crestor. ?Maybe [name] little delirium today with waxing/waning mental status per discussion with wife, follow closely w/u further if worsening/persistent ? ? ? ? ?Pyuria ?Has indwelling foley, was recently treated for UTI ?Follow repeat urine culture - no growth  ? ? ? ?Bradycardia ?HR 38-60s last night ?EKG today with sinus rhythm first degree av block ?Seems asymptomatic at this time, will follow on tele ?Of note, was seen by cardiology 07/2021 for bradycardia, 2nd degree heart block, at that time did not thing it was clinically relevant ? ? 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 ?History of renal transplant ?Continue myfortic, cyclosporine  ?Creatinine is 1.9 which appears around his baseline ?Continue calcitriol, bicarb ?R forearm fistula ? ?Essential hypertension ?BP elevated, fluctuating  ?Home hydral, losartan on hold with permissive hypotension ? ? ?Anemia ?Likely related to CKD ?Follow anemia labs ? ?Seizure ([place]) ?Continue keppra ? ?Type 2 diabetes mellitus ([place]) ?lantus at home (on sliding scale?) ?SSI aspart ? ?Benign prostatic hyperplasia with lower urinary tract symptoms ?Finasteride ?Has chronic indwelling foley  ? ?Dementia ([place]) ?Delirium preacutions ? ?Atrial fibrillation ([place]) ?S/p ablation ? ? ?DVT prophylaxis: lovenox ?Code Status: full ?Family Communication: wife at bedside ?Disposition:  ? ?Status is: Observation ?The patient will require care spanning > 2 midnights and should be moved to inpatient because: need for stroke w/u, neurology eval ?  ?Consultants:  ?neurology ? ?Procedures:  ?none ? ?Antimicrobials:  ?Anti-infectives (From admission, onward)  ? ? None  ? ?  ? ? ?Subjective: ?No new complaints today ?Wife noticed maybe Gilbert Wilson little more  confused today, was better last night and this morning, maybe Gilbert Wilson little more confused now ? ?Objective: ?Vitals:  ? 11/26/21 0844 11/26/21 1201  11/26/21 1555 11/26/21 2000  ?BP: (!) 177/58 (!) 154/72 136/64 (!) 167/57  ?Pulse: 65 67 66 (!) 52  ?Resp: 16 20 17 15   ?Temp: (!) 97.3 ?F (36.3 ?C) 97.7 ?F (36.5 ?C) 98.4 ?F (36.9 ?C) 99.1 ?F (37.3 ?C)  ?TempSrc: Oral Oral Oral Oral  ?SpO2: 100% 98% 100% 99%  ?Weight:      ?Height:      ? ? ?Intake/Output Summary (Last 24 hours) at 11/26/2021 2112 ?Last data filed at 11/26/2021 1801 ?Gross per 24 hour  ?Intake 580 ml  ?Output 1200 ml  ?Net -620 ml  ? ?Filed Weights  ? 11/24/21 1817 11/25/21 1618  ?Weight: 90.7 kg 90.7 kg  ? ? ?Examination: ? ?General: No acute distress. ?Cardiovascular: RRR ?Lungs: unlabored ?Abdomen: Soft, nontender, nondistended  ?Neurological: chronic R sided weakness, slurred speech ?Skin: Warm and dry. No rashes or lesions. ?Extremities: No clubbing or cyanosis. No edema. ? ?Data Reviewed: I have personally reviewed following labs and imaging studies ? ?CBC: ?Recent Labs  ?Lab 11/24/21 ?1834 11/26/21 ?0155  ?WBC 5.9 5.3  ?NEUTROABS 3.8 2.9  ?HGB 11.3* 9.7*  ?HCT 35.9* 31.4*  ?MCV 76.1* 76.2*  ?PLT 255 254  ? ? ?Basic Metabolic Panel: ?Recent Labs  ?Lab 11/24/21 ?1834 11/26/21 ?0155  ?NA 140 140  ?K 3.9 3.9  ?CL 111 113*  ?CO2 21* 19*  ?GLUCOSE 147* 117*  ?BUN 40* 37*  ?CREATININE 1.90* 1.96*  ?CALCIUM 9.9 9.6  ?MG  --  1.8  ?PHOS  --  3.2  ? ? ?GFR: ?Estimated Creatinine Clearance: 38.4 mL/min (Gilbert Wilson) (by C-G formula based on SCr of 1.96 mg/dL (H)). ? ?Liver Function Tests: ?Recent Labs  ?Lab 11/24/21 ?1834 11/26/21 ?0155  ?AST 21 16  ?ALT 21 18  ?ALKPHOS 66 53  ?BILITOT 0.9 0.9  ?PROT 6.8 6.4*  ?ALBUMIN 3.5 3.1*  ? ? ?CBG: ?Recent Labs  ?Lab 11/26/21 ?9563 11/26/21 ?8756 11/26/21 ?1236 11/26/21 ?1559 11/26/21 ?2011  ?GLUCAP 104* 103* 144* 175* 94  ? ? ? ?Recent Results (from the past 240 hour(s))  ?Resp Panel by RT-PCR (Flu Gilbert Wilson&B, Covid) Nasopharyngeal Swab     Status: None  ? Collection Time: 11/24/21  6:54 PM  ? Specimen: Nasopharyngeal Swab; Nasopharyngeal(NP) swabs in vial transport medium   ?Result Value Ref Range Status  ? SARS Coronavirus 2 by RT PCR NEGATIVE NEGATIVE Final  ?  Comment: (NOTE) ?SARS-CoV-2 target nucleic acids are NOT DETECTED. ? ?The SARS-CoV-2 RNA is generally detectable in upper respiratory ?specimens during the acute phase of infection. The lowest ?concentration of SARS-CoV-2 viral copies this assay can detect is ?138 copies/mL. Gilbert Wilson negative result does not preclude SARS-Cov-2 ?infection and should not be used as the sole basis for treatment or ?other patient management decisions. Gilbert Wilson negative result may occur with  ?improper specimen collection/handling, submission of specimen other ?than nasopharyngeal swab, presence of viral mutation(s) within the ?areas targeted by this assay, and inadequate number of viral ?copies(<138 copies/mL). Gilbert Wilson negative result must be combined with ?clinical observations, patient history, and epidemiological ?information. The expected result is Negative. ? ?Fact Sheet for Patients:  ?EntrepreneurPulse.com.au ? ?Fact Sheet for Healthcare Providers:  ?IncredibleEmployment.be ? ?This test is no t yet approved or cleared by the [place] FDA and  ?has been authorized for detection and/or diagnosis of SARS-CoV-2 by ?FDA under an Emergency Use  Authorization (EUA). This EUA will remain  ?in effect (meaning this test can be used) for the duration of the ?COVID-19 declaration under Section 564(b)(1) of the Act, 21 ?U.S.C.section 360bbb-3(b)(1), unless the authorization is terminated  ?or revoked sooner.  ? ? ?  ? Influenza Gilbert Wilson by PCR NEGATIVE NEGATIVE Final  ? Influenza B by PCR NEGATIVE NEGATIVE Final  ?  Comment: (NOTE) ?The Xpert Xpress SARS-CoV-2/FLU/RSV plus assay is intended as an aid ?in the diagnosis of influenza from Nasopharyngeal swab specimens and ?should not be used as Gilbert Wilson sole basis for treatment. Nasal washings and ?aspirates are unacceptable for Xpert Xpress SARS-CoV-2/FLU/RSV ?testing. ? ?Fact Sheet for  Patients: ?EntrepreneurPulse.com.au ? ?Fact Sheet for Healthcare Providers: ?IncredibleEmployment.be ? ?This test is not yet approved or cleared by the [place] FDA and ?has been Gilbert Wilson

## 2021-11-26 NOTE — Assessment & Plan Note (Addendum)
HR 38-60s last night ?EKG today with sinus rhythm first degree av block ?Seems asymptomatic at this time, will follow on tele ?Of note, was seen by cardiology 07/2021 for bradycardia, 2nd degree heart block, at that time did not thing it was clinically relevant ? ?

## 2021-11-26 NOTE — Progress Notes (Signed)
PT Cancellation Note ? ?Patient Details ?Name: Gilbert Wilson ?MRN: [id number] ?DOB: 30-Jun-1946 ? ? ?Cancelled Treatment:    Reason Eval/Treat Not Completed: PT screened, no needs identified, will sign off Pt evaluated by PT 3/27; please refer to that note for recommendations. ? ?Wyona Almas, PT, DPT ?Acute Rehabilitation Services ?Pager [phone] ?Office [phone] ? ? ? ?[name] ?11/26/2021, 4:26 PM ?

## 2021-11-27 DIAGNOSIS — I639 Cerebral infarction, unspecified: Secondary | ICD-10-CM | POA: Diagnosis not present

## 2021-11-27 LAB — GLUCOSE, CAPILLARY
Glucose-Capillary: 108 mg/dL — ABNORMAL HIGH (ref 70–99)
Glucose-Capillary: 176 mg/dL — ABNORMAL HIGH (ref 70–99)
Glucose-Capillary: 96 mg/dL (ref 70–99)
Glucose-Capillary: 97 mg/dL (ref 70–99)

## 2021-11-27 MED ORDER — CLOPIDOGREL BISULFATE 75 MG PO TABS: 75.0000 mg | ORAL_TABLET | Freq: Every day | ORAL | 0 refills | Status: AC

## 2021-11-27 MED ORDER — ASPIRIN 81 MG PO TBEC: 81.0000 mg | DELAYED_RELEASE_TABLET | Freq: Every day | ORAL | 11 refills | Status: AC

## 2021-11-27 NOTE — TOC Progression Note (Signed)
Transition of Care (TOC) - Progression Note  ? ? ?Patient Details  ?Name: Gilbert Wilson ?MRN: [id number] ?Date of Birth: Nov 25, 1945 ? ?Transition of Care (TOC) CM/SW Contact  ?Cyndi Bender, RN ?Phone Number: ?11/27/2021, 9:37 AM ? ?Clinical Narrative:    ?Faxed home health orders to [place] ?Dr. Radford Pax fax # [phone] ? ? ?Expected Discharge Plan: [place] ?Barriers to Discharge: Continued Medical Work up ? ?Expected Discharge Plan and Services ?Expected Discharge Plan: [place] ?  ?Discharge Planning Services: CM Consult ?Post Acute Care Choice: Home Health ?Living arrangements for the past 2 months: [place] ?                ?  ?  ?  ?  ?  ?HH Arranged: PT, OT ?[place] Agency: [place] ?Date HH Agency Contacted: 11/26/21 ?Time [place]: 1207 ?Representative spoke with at [place]: Tommi Rumps ? ? ?Social Determinants of Health (SDOH) Interventions ?  ? ?Readmission Risk Interventions ?   ? View : No data to display.  ?  ?  ?  ? ? ?

## 2021-11-27 NOTE — Progress Notes (Signed)
Speech Language Pathology Treatment: Cognitive-Linquistic;Dysphagia  ?Patient Details ?Name: Gilbert Wilson ?MRN: [id number] ?DOB: 06-29-1946 ?Today's Date: 11/27/2021 ?Time: [phone] ?SLP Time Calculation (min) (ACUTE ONLY): 34 min ? ?Assessment / Plan / Recommendation ?Clinical Impression ? Session commenced focusing on pt's speech goals in addition to creation of swallowing goals given pt's prolonged oral pocketing without efficient clearance.   ? ?Daughter, Gilbert Wilson, with pt and confirms occasional issues swallowing pills (requiring crushed in hospital) as well as oral pocketing.  Pt required max verbal/tactile cues to clear right oral retention of bacon- requiring SLP to provide instruction to finger sweep and swallow retention with applesauce.  Pt then cued to expectorate after lack of clearance over approx 8 minutes.  Above strategies found to be effective for pt.  Gustatory and sensory changes with dementia reviewed as well - recommend diet as tolerated with compensation strategies.  ? ?Dysarthria goals addressed as well using max verbal/visual cues for slowing rate and overarticulation.  Imprecise articulation most notably with consonant clusters and velar sounds with rapid rate of speech noted.  Using teach back, chunking speech to separate syllables with overarticulation improved intelligibility to overall approx 75% for 3-4 word phrases.  Encouraged small frequent speech sessions, assuring motivating topic for pt, establishing commonality/known topic, and assure working in a quiet, focused environment.  Using teach back, pt verbalized that he will practice as he is stoic and quiet at baseline.    ? ?Pt's cognitive linguistic deficit noted with delayed responses and expressive language deficits which may impact his speech rehab, however he and his family appear very motivated and his family is extremely supportive. ? ?Pt reports his speech remains impaired and his daughter confirms current speech deficits  are new with this event.  [place] SLP advised.   ?   ?HPI HPI: 76 y.o. 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  Gilbert Wilson lives at home with his wife and family who provide 24 hour support. ?  ?   ?SLP Plan ? Continue with current plan of care ? ?  ?  ?Recommendations for follow up therapy are one component of a multi-disciplinary discharge planning process, led by the attending physician.  Recommendations may be updated based on patient status, additional functional criteria and insurance authorization. ?  ? ?Recommendations  ?Diet recommendations: Thin liquid;Regular ?Medication Administration: Other (Comment) (or with puree) ?Supervision: Patient able to self feed ?Compensations: Slow rate;Small sips/bites;Lingual sweep for clearance of pocketing (use puree to clear oral pocketing if needed) ?Postural Changes and/or Swallow Maneuvers: Seated upright 90 degrees;Upright 30-60 min after meal  ?   ?    ?   ? ? ? ? Oral Care Recommendations: Oral care BID (oral care after meals and BID) ?Follow Up Recommendations: Home health SLP ?Assistance recommended at discharge: Frequent or constant Supervision/Assistance ?SLP Visit Diagnosis: Dysarthria and anarthria (R47.1);Dysphagia, oral phase (R13.11) ?Plan: Continue with current plan of care ? ? ? ? ?  ?  ?Kathleen Lime, MS CCC SLP ?Acute Rehab Services ?Office [phone] ?Pager [phone] ? ? ?Gilbert Wilson ? ?11/27/2021, 10:01 AM ?

## 2021-11-27 NOTE — Plan of Care (Signed)
  Problem: Education: Goal: Knowledge of General Education information will improve Description: Including pain rating scale, medication(s)/side effects and non-pharmacologic comfort measures Outcome: Progressing   Problem: Health Behavior/Discharge Planning: Goal: Ability to manage health-related needs will improve Outcome: Progressing   Problem: Clinical Measurements: Goal: Ability to maintain clinical measurements within normal limits will improve Outcome: Progressing Goal: Will remain free from infection Outcome: Progressing Goal: Diagnostic test results will improve Outcome: Progressing Goal: Respiratory complications will improve Outcome: Progressing Goal: Cardiovascular complication will be avoided Outcome: Progressing   Problem: Activity: Goal: Risk for activity intolerance will decrease Outcome: Progressing   Problem: Nutrition: Goal: Adequate nutrition will be maintained Outcome: Progressing   Problem: Coping: Goal: Level of anxiety will decrease Outcome: Progressing   Problem: Elimination: Goal: Will not experience complications related to bowel motility Outcome: Progressing Goal: Will not experience complications related to urinary retention Outcome: Progressing   Problem: Pain Managment: Goal: General experience of comfort will improve Outcome: Progressing   Problem: Safety: Goal: Ability to remain free from injury will improve Outcome: Progressing   Problem: Skin Integrity: Goal: Risk for impaired skin integrity will decrease Outcome: Progressing   Problem: Education: Goal: Knowledge of disease or condition will improve Outcome: Progressing Goal: Knowledge of secondary prevention will improve (SELECT ALL) Outcome: Progressing Goal: Knowledge of patient specific risk factors will improve (INDIVIDUALIZE FOR PATIENT) Outcome: Progressing Goal: Individualized Educational Video(s) Outcome: Progressing   Problem: Coping: Goal: Will verbalize  positive feelings about self Outcome: Progressing Goal: Will identify appropriate support needs Outcome: Progressing   Problem: Health Behavior/Discharge Planning: Goal: Ability to manage health-related needs will improve Outcome: Progressing   Problem: Self-Care: Goal: Ability to participate in self-care as condition permits will improve Outcome: Progressing Goal: Verbalization of feelings and concerns over difficulty with self-care will improve Outcome: Progressing Goal: Ability to communicate needs accurately will improve Outcome: Progressing   Problem: Nutrition: Goal: Risk of aspiration will decrease Outcome: Progressing   Problem: Ischemic Stroke/TIA Tissue Perfusion: Goal: Complications of ischemic stroke/TIA will be minimized Outcome: Progressing   

## 2021-11-27 NOTE — Discharge Summary (Signed)
Physician Discharge Summary  ?Quinn Quam JIR:[id number] DOB: 1946/02/26 DOA: 11/24/2021 ? ?[facility]P: [facility] ? ?Admit date: 11/24/2021 ?Discharge date: 11/27/2021 ? ?Admitted From: Home ?Disposition: Home ? ?Recommendations for Outpatient Follow-up:  ?Follow up with PCP in 1-2 weeks ? ? ?Home Health: PT/OT/speech ?Equipment/Devices: Available at home ? ?Discharge Condition: Fair ?CODE STATUS: Full code ?Diet recommendation: Regular diet, low-carb. ? ?Discharge summary: ?76 y.o. male with medical history significant of symptomatic bradycardia, prior CVA with right-sided hemiplegia, dementia, hepatic artery aneurysm status post repair, chronic hepatitis C, cirrhosis, liver failure status post liver transplant, ESRD status post renal transplant, hypertension, diabetes, seizure disorder, BPH, A-fib status post ablation, anemia presenting with altered mental status and found to have an acute stroke. He's been admitted for stroke workup, neurology following.  Neurology recommended DAPTx3 weeks followed by aspirin alone.    ?  ? ?Assessment and plan of care: ? ?Acute CVA (cerebrovascular accident) ([place]) ?Hx prior stroke 2019 with R sided weakness ?Presenting now with confusion and slurred speech for a few days  ?Head CT with chronic atrophy and ischemic changes ?MRI brain with acute infarct in right corona radiata ?Carotid US , within normal limits. ?MRA head , no large vessel occlusion. ?Echo , normal ejection fraction.  No intracardiac thrombus. ?PT/OT/SLP -> home health ?LDL 50, continue crestor ?A1c 6.5 ?Appreciate neurology recommendings -> add ASA to plavix for DAPT (failed plavix monotherapy).  Recommending DAPTx3 weeks, then aspirin alone.  Continue crestor. ?  ?Pyuria ?Has indwelling foley, was recently treated for UTI ?Follow repeat urine culture - no growth   ?  ?Bradycardia ?HR 38-60s last night, EKG today with sinus rhythm first degree av block ?Seems asymptomatic at this time,. ?Of note, was seen  by cardiology 07/2021 for bradycardia, 2nd degree heart block, at that time did not thing it was clinically relevant. ?Remains fairly stable.  Heart rate more than 30.  Not on any rate control medications.  ?  ?Liver transplant recipient (HCC)/kidney transplant: ?Hx hep c, cirrhosis ?s/p liver transplant ?Continue cyclosporine and mycophenolate, Myfortic.  Renal functions at baseline.  On calcitriol and bicarb continue. ?  ? ?Essential hypertension ?Stable on home medications. ?  ? Seizure ([place]) ?Continue keppra ?  ?Type 2 diabetes mellitus ([place]) ?lantus at home , uses very small dose of insulin depending upon sugars.  Continue. ?  ?Benign prostatic hyperplasia with lower urinary tract symptoms ?Finasteride ?Has chronic indwelling foley  ?  ?Dementia ([place]) ?Delirium preacutions ?  ?Atrial fibrillation ([place]) ?S/p ablation.  Not on anticoagulation. ? ?Chronically sick but stable.  Has adequate support system at home.  Family willing to take him home. ?  ? ? ?Discharge Diagnoses:  ?Principal Problem: ?  Acute CVA (cerebrovascular accident) ([place]) ?Active Problems: ?  Bradycardia ?  Pyuria ?  History of renal transplant ?  Liver transplant recipient [facility]) ?  Essential hypertension ?  Anemia ?  Type 2 diabetes mellitus ([place]) ?  Seizure ([place]) ?  Benign prostatic hyperplasia with lower urinary tract symptoms ?  Dementia ([place]) ?  Atrial fibrillation ([place]) ?  Stroke [facility]) ?  Posttraumatic stress disorder ?  Obstructive sleep apnea of adult ?  Hemiplegia, unspecified affecting unspecified side ([place]) ? ? ? ?Discharge Instructions ? ?Discharge Instructions   ? ? Diet - low sodium heart healthy   Complete by: As directed ?  ? Increase activity slowly   Complete by: As directed ?  ? ?  ? ?Allergies as of 11/27/2021   ? ?  Reactions  ? Levaquin [levofloxacin] Itching  ? Linezolid   ? Seizures  ? Lisinopril Cough  ? ?  ? ?  ?Medication List  ?  ? ?TAKE these medications   ? ?aspirin 81 MG EC tablet ?Take 1 tablet (81 mg total) by  mouth daily. Swallow whole. ?  ?calcitRIOL 0.25 MCG capsule ?Commonly known as: ROCALTROL ?Take 0.25 mcg by mouth daily. ?  ?clopidogrel 75 MG tablet ?Commonly known as: PLAVIX ?Take 1 tablet (75 mg total) by mouth daily for 21 days. ?  ?cycloSPORINE modified 100 MG capsule ?Commonly known as: NEORAL ?Take 150 mg by mouth 2 (two) times daily. ?  ?finasteride 5 MG tablet ?Commonly known as: PROSCAR ?Take 5 mg by mouth every evening. ?  ?hydrALAZINE 50 MG tablet ?Commonly known as: APRESOLINE ?Take 1 tablet (50 mg total) by mouth 3 (three) times daily. ?What changed: how much to take ?  ?IRON-150 PO ?Take 1 tablet by mouth daily. ?  ?Lantus SoloStar 100 UNIT/ML Solostar Pen ?Generic drug: insulin glargine ?Inject 0-6 Units into the skin at bedtime. If BS>130-Take 6 units, Increase 1 unit for every 10 mg/dl above 130. ?  ?levETIRAcetam 500 MG tablet ?Commonly known as: KEPPRA ?Take 500 mg by mouth 2 (two) times daily. ?  ?losartan 100 MG tablet ?Commonly known as: COZAAR ?Take 1 tablet (100 mg total) by mouth daily. ?What changed:  ?how much to take ?when to take this ?  ?MAGNESIUM OXIDE 400 PO ?Take 400 mg by mouth daily. ?  ?mirtazapine 15 MG tablet ?Commonly known as: REMERON ?Take 15 mg by mouth at bedtime. ?  ?mycophenolate 180 MG EC tablet ?Commonly known as: MYFORTIC ?Take 180 mg by mouth 2 (two) times daily. ?  ?rosuvastatin 10 MG tablet ?Commonly known as: CRESTOR ?Take 10 mg by mouth every evening. ?  ?sodium bicarbonate 650 MG tablet ?Take 1,300 mg by mouth 2 (two) times daily. ?  ? ?  ? ? Follow-up Information   ? ? Care, [facility] Follow up.   ?Specialty: Home Health Services ?Why: for home health care. ?Contact information: ?[address] ?[address] ?[place] [address] ?[phone] ? ? ?  ?  ? ?  ?  ? ?  ? ?Allergies  ?Allergen Reactions  ? Levaquin [Levofloxacin] Itching  ? Linezolid   ?  Seizures  ? Lisinopril Cough  ? ? ?Consultations: ?Neurology ? ? ?Procedures/Studies: ?DG Chest 2  View ? ?Result Date: 11/12/2021 ?CLINICAL DATA:  Sepsis EXAM: CHEST - 2 VIEW COMPARISON:  07/04/2021 FINDINGS: Frontal and lateral views of the chest demonstrate a stable cardiac silhouette. Lung volumes are diminished, without superimposed airspace disease, effusion, or pneumothorax. No acute bony abnormalities. IMPRESSION: 1. Low lung volumes.  No acute airspace disease. Electronically Signed   By: Randa Ngo M.D.   On: 11/12/2021 22:19  ? ?CT Head Wo Contrast ? ?Result Date: 11/24/2021 ?CLINICAL DATA:  Increasing confusion and slurred speech EXAM: CT HEAD WITHOUT CONTRAST TECHNIQUE: Contiguous axial images were obtained from the base of the skull through the vertex without intravenous contrast. RADIATION DOSE REDUCTION: This exam was performed according to the departmental dose-optimization program which includes automated exposure control, adjustment of the mA and/or kV according to patient size and/or use of iterative reconstruction technique. COMPARISON:  09/03/2020 FINDINGS: Brain: No evidence of acute infarction, hemorrhage, hydrocephalus, extra-axial collection or mass lesion/mass effect. Chronic atrophic and ischemic changes are noted similar to that seen on the prior exam. Vascular: No hyperdense vessel  or unexpected calcification. Skull: Normal. Negative for fracture or focal lesion. Sinuses/Orbits: No acute finding. Other: None. IMPRESSION: Chronic atrophic and ischemic changes.  No acute abnormality noted. Electronically Signed   By: Inez Catalina M.D.   On: 11/24/2021 19:25  ? ?MR ANGIO HEAD WO CONTRAST ? ?Result Date: 11/25/2021 ?CLINICAL DATA:  Stroke, follow up EXAM: MRA HEAD WITHOUT CONTRAST TECHNIQUE: Angiographic images of the Circle of Willis were acquired using MRA technique without intravenous contrast. COMPARISON:  MRI November 24, 2021. FINDINGS: Motion limited study. Anterior circulation: Bilateral intracranial ICAs, MCAs, and ACAs are patent without proximal hemodynamically significant  stenosis. No aneurysm identified. Posterior circulation: Left dominant intradural vertebral artery. Right intradural vertebral artery appears to terminate as PICA, anatomic variant. Left intradural vertebral artery

## 2021-11-27 NOTE — Plan of Care (Signed)
?  Problem: Education: ?Goal: Knowledge of General Education information will improve ?Description: Including pain rating scale, medication(s)/side effects and non-pharmacologic comfort measures ?Outcome: Progressing ?  ?Problem: Health Behavior/Discharge Planning: ?Goal: Ability to manage health-related needs will improve ?Outcome: Progressing ?  ?Problem: Clinical Measurements: ?Goal: Ability to maintain clinical measurements within normal limits will improve ?Outcome: Progressing ?Goal: Will remain free from infection ?Outcome: Progressing ?Goal: Diagnostic test results will improve ?Outcome: Progressing ?Goal: Respiratory complications will improve ?Outcome: Progressing ?Goal: Cardiovascular complication will be avoided ?Outcome: Progressing ?  ?Problem: Activity: ?Goal: Risk for activity intolerance will decrease ?Outcome: Progressing ?  ?Problem: Nutrition: ?Goal: Adequate nutrition will be maintained ?Outcome: Progressing ?  ?Problem: Coping: ?Goal: Level of anxiety will decrease ?Outcome: Progressing ?  ?Problem: Elimination: ?Goal: Will not experience complications related to bowel motility ?Outcome: Progressing ?Goal: Will not experience complications related to urinary retention ?Outcome: Progressing ?  ?Problem: Pain Managment: ?Goal: General experience of comfort will improve ?Outcome: Progressing ?  ?Problem: Safety: ?Goal: Ability to remain free from injury will improve ?Outcome: Progressing ?  ?Problem: Skin Integrity: ?Goal: Risk for impaired skin integrity will decrease ?Outcome: Progressing ?  ?Problem: Education: ?Goal: Knowledge of disease or condition will improve ?Outcome: Progressing ?Goal: Knowledge of secondary prevention will improve (SELECT ALL) ?Outcome: Progressing ?Goal: Knowledge of patient specific risk factors will improve (INDIVIDUALIZE FOR PATIENT) ?Outcome: Progressing ?Goal: Individualized Educational Video(s) ?Outcome: Progressing ?  ?Problem: Coping: ?Goal: Will verbalize  positive feelings about self ?Outcome: Progressing ?Goal: Will identify appropriate support needs ?Outcome: Progressing ?  ?Problem: Health Behavior/Discharge Planning: ?Goal: Ability to manage health-related needs will improve ?Outcome: Progressing ?  ?Problem: Self-Care: ?Goal: Ability to participate in self-care as condition permits will improve ?Outcome: Progressing ?  ?

## 2021-11-27 NOTE — TOC Transition Note (Signed)
Transition of Care (TOC) - CM/SW Discharge Note ? ? ?Patient Details  ?Name: Gilbert Wilson ?MRN: [id number] ?Date of Birth: 09/22/1945 ? ?Transition of Care (TOC) CM/SW Contact:  ?Cyndi Bender, RN ?Phone Number: ?11/27/2021, 11:50 AM ? ? ?Clinical Narrative:    ?Patient stable for discharge. Home Health arranged with [facility]. Cory with [facility] notified of discharge and PCP name and number.  ?Spoke to wife. [name], and notified her of discharge and verified address for [facility] transport.  ?PTAR called for 12pm transport and notified RN. ? ? ? ?Final next level of care: [place] ?Barriers to Discharge: Barriers Resolved ? ? ?Patient Goals and CMS Choice ?Patient states their goals for this hospitalization and ongoing recovery are:: to return home ?CMS Medicare.gov Compare Post Acute Care list provided to:: Other (Comment Required) ?Choice offered to / list presented to : Spouse ? ?Discharge Placement ?  ?           ? home ?  ?  ?  ? ?Discharge Plan and Services ?  ?Discharge Planning Services: CM Consult ?Post Acute Care Choice: Home Health          ?  ?  ?  ?  ?  ?HH Arranged: PT, OT ?[place] Agency: [place] ?Date HH Agency Contacted: 11/26/21 ?Time [place]: 1207 ?Representative spoke with at [place]: Tommi Rumps ? ?Social Determinants of Health (SDOH) Interventions ?  ? ? ?Readmission Risk Interventions ? ?  11/27/2021  ? 10:20 AM  ?Readmission Risk Prevention Plan  ?Transportation Screening Complete  ?PCP or Specialist Appt within 3-5 Days Complete  ?[place] or Home Care Consult Complete  ?Social Work Consult for [place] Planning/Counseling Complete  ?Palliative Care Screening Not Applicable  ?Medication Review Press photographer) Complete  ? ? ? ? ? ?

## 2021-12-02 ENCOUNTER — Other Ambulatory Visit: Payer: Self-pay | Admitting: *Deleted

## 2021-12-02 DIAGNOSIS — I639 Cerebral infarction, unspecified: Secondary | ICD-10-CM

## 2021-12-02 NOTE — Patient Outreach (Signed)
[facility]) Care Management ? ?12/02/2021 ? ?Jasper Riling ?[birth year] ?[id number] ? ? ?RED ON EMMI ALERT - Stroke ?Day # 1 ?Date: 3/31 ?Red Alert Reason: Scheduled follow up appointment?  NO ? ? ?Outreach attempt #1, successful to wife as member has history of dementia.  Identity verified.  This care manager introduced self and stated purpose of call.  [facility] care management services explained.   ? ?Per wife, member has had a stroke in the past and has been slightly disabled since.  Report this new stroke has caused more weakness and he is currently wheelchair bound.  He does have PT/OT/SLP ordered through [facility].  Wife has not received call to schedule initial visit yet however member was just discharged on Friday.  She report his speech has improved since diagnosis.  Has son and daughter in the home to help with management of member's care but she is concerned about being able to get member to follow up appointments.  Since he is now in the wheelchair, state it would be difficult to transfer him multiple times in/out of the car. ? ?Member is active with the [facility], has primary, GI, Nephrology, and Neurology at the [facility].  She has reached out to the clinic, waiting on call back to schedule follow up appointments.  She has also discussed the possibility of in home aide services with the [place] team, waiting to receive paperwork to complete.  Report member is compliant with medication management with her help, does question discontinuing Plavix after 3 weeks as he was on this prior to hospitalization.  She will speak to provider about this concern.   ? ?Plan: ?RN CM will send education regarding stroke recovery and follow up within the next 2 weeks.  Will also send referral to Care Guide for transportation resources. ? ?Valente David, RN, MSN, CCM ?[facility] Care Management  ?Community Care Manager ?[phone] ? ?

## 2021-12-02 NOTE — Patient Outreach (Signed)
Received a red flag [place] stroke notification for Gilbert Wilson . ?I have assigned Valente David, RN to call for follow up and determine if there are any Case Management needs.  ?  ?Arville Care, CBCS, CMAA ?[place] Management Assistant ?[place] Management ?[phone]   ?

## 2021-12-03 ENCOUNTER — Telehealth: Payer: Self-pay

## 2021-12-03 NOTE — Telephone Encounter (Signed)
? ?  Telephone encounter was:  Successful.  ?12/03/2021 ?Name: Keifer Habib MRN: [id number] DOB: 07-01-1946 ? ?Zyrus Hetland is a 76 y.o. year old male who is a primary care patient of [facility] . The community resource team was consulted for assistance with Transportation Needs  ? ?Care guide performed the following interventions: Patient provided with information about care guide support team and interviewed to confirm resource needs.Patient is looking for transportation for doctor appointments. They are going to call insurance and [facility] and call me back ? ?Follow Up Plan:  Care guide will follow up with patient by phone over the next day ? ? ? ?Larena Sox ?Care Guide, Embedded Care Coordination ?[place], Care Management  ?[phone] ?[address], [place], [place] [address] ?Phone: [phone] ?Email: [email or web address] ? ?  ?

## 2021-12-10 ENCOUNTER — Telehealth: Payer: Self-pay

## 2021-12-10 NOTE — Telephone Encounter (Signed)
? ?  Telephone encounter was:  Successful.  ?12/10/2021 ?Name: Gilbert Wilson MRN: [id number] DOB: [birth year] ? ?Gilbert Wilson is a 76 y.o. year old male who is a primary care patient of [facility] . The community resource team was consulted for assistance with Transportation Needs  ? ?Care guide performed the following interventions: Follow up call placed to the patient to discuss status of referral.Waiting on the [facility] to call back but at this time no transportation is needed ? ?Follow Up Plan:  No further follow up planned at this time. The patient has been provided with needed resources. ? ? ? ?Larena Sox ?Care Guide, Embedded Care Coordination ?, Care Management  ?[phone] ?[address], [place], [place] [address] ?Phone: [phone] ?Email: [email or web address] ? ?  ?

## 2021-12-16 ENCOUNTER — Other Ambulatory Visit: Payer: Self-pay | Admitting: *Deleted

## 2021-12-16 NOTE — Patient Outreach (Signed)
[facility]) Care Management ? ?12/16/2021 ? ?Jasper Riling ?[birth year] ?[id number] ? ? ?Outgoing call placed to member's wife, successful.  State member is improving with home health PT/OT.  His speech has gotten better, appetite improved.  All appointments scheduled through the [place], including neurology on 5/25.  He will now have a primary provider that will make home visits, which makes it easier on the member and family.  Denies any urgent concerns, encouraged to contact this care manager with questions.  Will close case at this time, no further needs identified.  ? ?Valente David, RN, MSN, CCM ?[facility] Care Management  ?Community Care Manager ?[phone] ? ?

## 2021-12-29 ENCOUNTER — Other Ambulatory Visit: Payer: Self-pay | Admitting: Student

## 2022-01-01 ENCOUNTER — Emergency Department (HOSPITAL_COMMUNITY): Payer: No Typology Code available for payment source

## 2022-01-01 ENCOUNTER — Inpatient Hospital Stay (HOSPITAL_COMMUNITY)
Admission: EM | Admit: 2022-01-01 | Discharge: 2022-01-05 | DRG: 698 | Disposition: A | Discharge status: Home Health | Payer: No Typology Code available for payment source | Attending: Internal Medicine | Admitting: Internal Medicine

## 2022-01-01 ENCOUNTER — Other Ambulatory Visit: Payer: Self-pay

## 2022-01-01 DIAGNOSIS — G9341 Metabolic encephalopathy: Secondary | ICD-10-CM | POA: Diagnosis present

## 2022-01-01 DIAGNOSIS — Y846 Urinary catheterization as the cause of abnormal reaction of the patient, or of later complication, without mention of misadventure at the time of the procedure: Secondary | ICD-10-CM | POA: Diagnosis present

## 2022-01-01 DIAGNOSIS — Z7982 Long term (current) use of aspirin: Secondary | ICD-10-CM

## 2022-01-01 DIAGNOSIS — N186 End stage renal disease: Secondary | ICD-10-CM | POA: Diagnosis not present

## 2022-01-01 DIAGNOSIS — R569 Unspecified convulsions: Secondary | ICD-10-CM

## 2022-01-01 DIAGNOSIS — N401 Enlarged prostate with lower urinary tract symptoms: Secondary | ICD-10-CM | POA: Diagnosis not present

## 2022-01-01 DIAGNOSIS — B964 Proteus (mirabilis) (morganii) as the cause of diseases classified elsewhere: Secondary | ICD-10-CM | POA: Diagnosis present

## 2022-01-01 DIAGNOSIS — T8619 Other complication of kidney transplant: Secondary | ICD-10-CM | POA: Diagnosis present

## 2022-01-01 DIAGNOSIS — N179 Acute kidney failure, unspecified: Secondary | ICD-10-CM | POA: Diagnosis present

## 2022-01-01 DIAGNOSIS — R4182 Altered mental status, unspecified: Secondary | ICD-10-CM | POA: Diagnosis not present

## 2022-01-01 DIAGNOSIS — T83511A Infection and inflammatory reaction due to indwelling urethral catheter, initial encounter: Principal | ICD-10-CM | POA: Diagnosis present

## 2022-01-01 DIAGNOSIS — Z20822 Contact with and (suspected) exposure to covid-19: Secondary | ICD-10-CM | POA: Diagnosis present

## 2022-01-01 DIAGNOSIS — Z940 Kidney transplant status: Secondary | ICD-10-CM | POA: Diagnosis not present

## 2022-01-01 DIAGNOSIS — E119 Type 2 diabetes mellitus without complications: Secondary | ICD-10-CM

## 2022-01-01 DIAGNOSIS — N189 Chronic kidney disease, unspecified: Secondary | ICD-10-CM

## 2022-01-01 DIAGNOSIS — I959 Hypotension, unspecified: Secondary | ICD-10-CM | POA: Diagnosis present

## 2022-01-01 DIAGNOSIS — N138 Other obstructive and reflux uropathy: Secondary | ICD-10-CM | POA: Diagnosis present

## 2022-01-01 DIAGNOSIS — G9340 Encephalopathy, unspecified: Secondary | ICD-10-CM | POA: Diagnosis not present

## 2022-01-01 DIAGNOSIS — B960 Mycoplasma pneumoniae [M. pneumoniae] as the cause of diseases classified elsewhere: Secondary | ICD-10-CM | POA: Diagnosis present

## 2022-01-01 DIAGNOSIS — E1151 Type 2 diabetes mellitus with diabetic peripheral angiopathy without gangrene: Secondary | ICD-10-CM | POA: Diagnosis present

## 2022-01-01 DIAGNOSIS — E872 Acidosis, unspecified: Secondary | ICD-10-CM | POA: Diagnosis present

## 2022-01-01 DIAGNOSIS — I441 Atrioventricular block, second degree: Secondary | ICD-10-CM | POA: Diagnosis present

## 2022-01-01 DIAGNOSIS — E785 Hyperlipidemia, unspecified: Secondary | ICD-10-CM | POA: Diagnosis present

## 2022-01-01 DIAGNOSIS — F0390 Unspecified dementia without behavioral disturbance: Secondary | ICD-10-CM | POA: Diagnosis present

## 2022-01-01 DIAGNOSIS — E871 Hypo-osmolality and hyponatremia: Secondary | ICD-10-CM | POA: Diagnosis present

## 2022-01-01 DIAGNOSIS — I4891 Unspecified atrial fibrillation: Secondary | ICD-10-CM | POA: Diagnosis not present

## 2022-01-01 DIAGNOSIS — I129 Hypertensive chronic kidney disease with stage 1 through stage 4 chronic kidney disease, or unspecified chronic kidney disease: Secondary | ICD-10-CM | POA: Diagnosis present

## 2022-01-01 DIAGNOSIS — R001 Bradycardia, unspecified: Secondary | ICD-10-CM | POA: Diagnosis present

## 2022-01-01 DIAGNOSIS — N390 Urinary tract infection, site not specified: Secondary | ICD-10-CM | POA: Diagnosis present

## 2022-01-01 DIAGNOSIS — Y830 Surgical operation with transplant of whole organ as the cause of abnormal reaction of the patient, or of later complication, without mention of misadventure at the time of the procedure: Secondary | ICD-10-CM | POA: Diagnosis present

## 2022-01-01 DIAGNOSIS — Z87440 Personal history of urinary (tract) infections: Secondary | ICD-10-CM

## 2022-01-01 DIAGNOSIS — Z7902 Long term (current) use of antithrombotics/antiplatelets: Secondary | ICD-10-CM

## 2022-01-01 DIAGNOSIS — Z944 Liver transplant status: Secondary | ICD-10-CM | POA: Diagnosis not present

## 2022-01-01 DIAGNOSIS — Z888 Allergy status to other drugs, medicaments and biological substances status: Secondary | ICD-10-CM

## 2022-01-01 DIAGNOSIS — G40909 Epilepsy, unspecified, not intractable, without status epilepticus: Secondary | ICD-10-CM | POA: Diagnosis present

## 2022-01-01 DIAGNOSIS — R339 Retention of urine, unspecified: Secondary | ICD-10-CM

## 2022-01-01 DIAGNOSIS — D84821 Immunodeficiency due to drugs: Secondary | ICD-10-CM | POA: Diagnosis present

## 2022-01-01 DIAGNOSIS — Z794 Long term (current) use of insulin: Secondary | ICD-10-CM

## 2022-01-01 DIAGNOSIS — I10 Essential (primary) hypertension: Secondary | ICD-10-CM | POA: Diagnosis not present

## 2022-01-01 DIAGNOSIS — Z79899 Other long term (current) drug therapy: Secondary | ICD-10-CM

## 2022-01-01 DIAGNOSIS — E860 Dehydration: Secondary | ICD-10-CM | POA: Diagnosis present

## 2022-01-01 DIAGNOSIS — Z79624 Long term (current) use of inhibitors of nucleotide synthesis: Secondary | ICD-10-CM

## 2022-01-01 DIAGNOSIS — Z881 Allergy status to other antibiotic agents status: Secondary | ICD-10-CM

## 2022-01-01 DIAGNOSIS — N1832 Chronic kidney disease, stage 3b: Secondary | ICD-10-CM | POA: Diagnosis present

## 2022-01-01 DIAGNOSIS — Z833 Family history of diabetes mellitus: Secondary | ICD-10-CM

## 2022-01-01 DIAGNOSIS — I6930 Unspecified sequelae of cerebral infarction: Secondary | ICD-10-CM | POA: Diagnosis not present

## 2022-01-01 DIAGNOSIS — E1122 Type 2 diabetes mellitus with diabetic chronic kidney disease: Secondary | ICD-10-CM | POA: Diagnosis present

## 2022-01-01 DIAGNOSIS — T83091A Other mechanical complication of indwelling urethral catheter, initial encounter: Secondary | ICD-10-CM | POA: Diagnosis present

## 2022-01-01 DIAGNOSIS — Z7401 Bed confinement status: Secondary | ICD-10-CM

## 2022-01-01 DIAGNOSIS — R600 Localized edema: Secondary | ICD-10-CM | POA: Diagnosis present

## 2022-01-01 DIAGNOSIS — I69351 Hemiplegia and hemiparesis following cerebral infarction affecting right dominant side: Secondary | ICD-10-CM | POA: Diagnosis not present

## 2022-01-01 DIAGNOSIS — D509 Iron deficiency anemia, unspecified: Secondary | ICD-10-CM | POA: Diagnosis present

## 2022-01-01 DIAGNOSIS — Z8249 Family history of ischemic heart disease and other diseases of the circulatory system: Secondary | ICD-10-CM

## 2022-01-01 DIAGNOSIS — M619 Calcification and ossification of muscle, unspecified: Secondary | ICD-10-CM | POA: Diagnosis not present

## 2022-01-01 DIAGNOSIS — Z87891 Personal history of nicotine dependence: Secondary | ICD-10-CM

## 2022-01-01 LAB — CBC WITH DIFFERENTIAL/PLATELET
Abs Immature Granulocytes: 0.09 10*3/uL — ABNORMAL HIGH (ref 0.00–0.07)
Basophils Absolute: 0 10*3/uL (ref 0.0–0.1)
Basophils Relative: 0 %
Eosinophils Absolute: 0 10*3/uL (ref 0.0–0.5)
Eosinophils Relative: 0 %
HCT: 38.3 % — ABNORMAL LOW (ref 39.0–52.0)
Hemoglobin: 11.9 g/dL — ABNORMAL LOW (ref 13.0–17.0)
Immature Granulocytes: 1 %
Lymphocytes Relative: 7 %
Lymphs Abs: 0.7 10*3/uL (ref 0.7–4.0)
MCH: 24 pg — ABNORMAL LOW (ref 26.0–34.0)
MCHC: 31.1 g/dL (ref 30.0–36.0)
MCV: 77.4 fL — ABNORMAL LOW (ref 80.0–100.0)
Monocytes Absolute: 1.4 10*3/uL — ABNORMAL HIGH (ref 0.1–1.0)
Monocytes Relative: 14 %
Neutro Abs: 8.2 10*3/uL — ABNORMAL HIGH (ref 1.7–7.7)
Neutrophils Relative %: 78 %
Platelets: 180 10*3/uL (ref 150–400)
RBC: 4.95 MIL/uL (ref 4.22–5.81)
RDW: 16.2 % — ABNORMAL HIGH (ref 11.5–15.5)
WBC: 10.5 10*3/uL (ref 4.0–10.5)
nRBC: 0 % (ref 0.0–0.2)

## 2022-01-01 LAB — COMPREHENSIVE METABOLIC PANEL
ALT: 7 U/L (ref 0–44)
AST: 12 U/L — ABNORMAL LOW (ref 15–41)
Albumin: 2.9 g/dL — ABNORMAL LOW (ref 3.5–5.0)
Alkaline Phosphatase: 63 U/L (ref 38–126)
Anion gap: 11 (ref 5–15)
BUN: 45 mg/dL — ABNORMAL HIGH (ref 8–23)
CO2: 16 mmol/L — ABNORMAL LOW (ref 22–32)
Calcium: 9.7 mg/dL (ref 8.9–10.3)
Chloride: 105 mmol/L (ref 98–111)
Creatinine, Ser: 2.8 mg/dL — ABNORMAL HIGH (ref 0.61–1.24)
GFR, Estimated: 23 mL/min — ABNORMAL LOW (ref >60)
Glucose, Bld: 229 mg/dL — ABNORMAL HIGH (ref 70–99)
Potassium: 4.1 mmol/L (ref 3.5–5.1)
Sodium: 132 mmol/L — ABNORMAL LOW (ref 135–145)
Total Bilirubin: 0.8 mg/dL (ref 0.3–1.2)
Total Protein: 6.1 g/dL — ABNORMAL LOW (ref 6.5–8.1)

## 2022-01-01 LAB — URINALYSIS, ROUTINE W REFLEX MICROSCOPIC
Bilirubin Urine: NEGATIVE
Glucose, UA: NEGATIVE mg/dL
Ketones, ur: NEGATIVE mg/dL
Nitrite: NEGATIVE
Protein, ur: 100 mg/dL — AB
RBC / HPF: >50 RBC/hpf — ABNORMAL HIGH (ref 0–5)
Specific Gravity, Urine: 1.011 (ref 1.005–1.030)
WBC, UA: >50 WBC/hpf — ABNORMAL HIGH (ref 0–5)
pH: 8 (ref 5.0–8.0)

## 2022-01-01 LAB — RESP PANEL BY RT-PCR (FLU A&B, COVID) ARPGX2
Influenza A by PCR: NEGATIVE
Influenza B by PCR: NEGATIVE
SARS Coronavirus 2 by RT PCR: NEGATIVE

## 2022-01-01 LAB — GLUCOSE, CAPILLARY: Glucose-Capillary: 156 mg/dL — ABNORMAL HIGH (ref 70–99)

## 2022-01-01 LAB — LACTIC ACID, PLASMA: Lactic Acid, Venous: 1.5 mmol/L (ref 0.5–1.9)

## 2022-01-01 LAB — CBG MONITORING, ED: Glucose-Capillary: 121 mg/dL — ABNORMAL HIGH (ref 70–99)

## 2022-01-01 MED ORDER — LEVETIRACETAM 500 MG PO TABS
500.0000 mg | ORAL_TABLET | Freq: Two times a day (BID) | ORAL | Status: DC
  Administered 2022-01-01 – 2022-01-04 (×8): 500 mg via ORAL
  Filled 2022-01-01 (×8): qty 1

## 2022-01-01 MED ORDER — SODIUM CHLORIDE 0.9 % IV SOLN: INTRAVENOUS | Status: AC

## 2022-01-01 MED ORDER — CYCLOSPORINE MODIFIED (NEORAL) 25 MG PO CAPS
150.0000 mg | ORAL_CAPSULE | Freq: Two times a day (BID) | ORAL | Status: DC
  Administered 2022-01-01 – 2022-01-04 (×8): 150 mg via ORAL
  Filled 2022-01-01 (×9): qty 6

## 2022-01-01 MED ORDER — ROSUVASTATIN CALCIUM 5 MG PO TABS
10.0000 mg | ORAL_TABLET | Freq: Every evening | ORAL | Status: DC
  Administered 2022-01-01 – 2022-01-03 (×3): 10 mg via ORAL
  Filled 2022-01-01 (×4): qty 2

## 2022-01-01 MED ORDER — SACCHAROMYCES BOULARDII 250 MG PO CAPS
250.0000 mg | ORAL_CAPSULE | Freq: Two times a day (BID) | ORAL | Status: DC
Start: 2022-01-01 — End: 2022-01-05
  Administered 2022-01-01 – 2022-01-04 (×8): 250 mg via ORAL
  Filled 2022-01-01 (×9): qty 1

## 2022-01-01 MED ORDER — SODIUM CHLORIDE 0.9 % IV BOLUS
500.0000 mL | Freq: Once | INTRAVENOUS | Status: AC
  Administered 2022-01-01: 500 mL via INTRAVENOUS

## 2022-01-01 MED ORDER — ASPIRIN EC 81 MG PO TBEC
81.0000 mg | DELAYED_RELEASE_TABLET | Freq: Every day | ORAL | Status: DC
  Administered 2022-01-01 – 2022-01-04 (×4): 81 mg via ORAL
  Filled 2022-01-01 (×4): qty 1

## 2022-01-01 MED ORDER — SODIUM BICARBONATE 650 MG PO TABS
1300.0000 mg | ORAL_TABLET | Freq: Two times a day (BID) | ORAL | Status: DC
  Administered 2022-01-01 – 2022-01-04 (×8): 1300 mg via ORAL
  Filled 2022-01-01 (×8): qty 2

## 2022-01-01 MED ORDER — LACTATED RINGERS IV BOLUS
1000.0000 mL | Freq: Once | INTRAVENOUS | Status: AC
  Administered 2022-01-01: 1000 mL via INTRAVENOUS

## 2022-01-01 MED ORDER — SODIUM CHLORIDE 0.9 % IV SOLN
2.0000 g | Freq: Once | INTRAVENOUS | Status: AC
  Administered 2022-01-01: 2 g via INTRAVENOUS
  Filled 2022-01-01: qty 20

## 2022-01-01 MED ORDER — HYDRALAZINE HCL 50 MG PO TABS
100.0000 mg | ORAL_TABLET | Freq: Three times a day (TID) | ORAL | Status: DC
Start: 2022-01-01 — End: 2022-01-02
  Filled 2022-01-01: qty 4
  Filled 2022-01-01 (×2): qty 2

## 2022-01-01 MED ORDER — MYCOPHENOLATE SODIUM 180 MG PO TBEC
180.0000 mg | DELAYED_RELEASE_TABLET | Freq: Two times a day (BID) | ORAL | Status: DC
Start: 2022-01-01 — End: 2022-01-05
  Administered 2022-01-01 – 2022-01-04 (×8): 180 mg via ORAL
  Filled 2022-01-01 (×9): qty 1

## 2022-01-01 MED ORDER — CALCITRIOL 0.25 MCG PO CAPS
0.2500 ug | ORAL_CAPSULE | Freq: Every day | ORAL | Status: DC
  Administered 2022-01-01 – 2022-01-04 (×4): 0.25 ug via ORAL
  Filled 2022-01-01 (×4): qty 1

## 2022-01-01 MED ORDER — FINASTERIDE 5 MG PO TABS
5.0000 mg | ORAL_TABLET | Freq: Every evening | ORAL | Status: DC
  Administered 2022-01-01 – 2022-01-03 (×3): 5 mg via ORAL
  Filled 2022-01-01 (×3): qty 1

## 2022-01-01 MED ORDER — SODIUM CHLORIDE 0.9 % IV SOLN
2.0000 g | INTRAVENOUS | Status: DC
  Administered 2022-01-02 – 2022-01-04 (×3): 2 g via INTRAVENOUS
  Filled 2022-01-01 (×3): qty 20

## 2022-01-01 MED ORDER — INSULIN ASPART 100 UNIT/ML IJ SOLN
0.0000 [IU] | Freq: Three times a day (TID) | INTRAMUSCULAR | Status: DC
  Administered 2022-01-03 – 2022-01-04 (×2): 1 [IU] via SUBCUTANEOUS

## 2022-01-01 MED ORDER — CLOPIDOGREL BISULFATE 75 MG PO TABS
75.0000 mg | ORAL_TABLET | Freq: Every day | ORAL | Status: DC
  Administered 2022-01-01 – 2022-01-04 (×4): 75 mg via ORAL
  Filled 2022-01-01 (×4): qty 1

## 2022-01-01 MED ORDER — MIRTAZAPINE 15 MG PO TABS
15.0000 mg | ORAL_TABLET | Freq: Every day | ORAL | Status: DC
Start: 2022-01-01 — End: 2022-01-02
  Filled 2022-01-01: qty 1

## 2022-01-01 MED ORDER — SODIUM CHLORIDE 0.9 % IV SOLN: 2.0000 g | Freq: Once | INTRAVENOUS | Status: DC

## 2022-01-01 NOTE — H&P (Addendum)
?History and Physical  ? ? ?Patient: Gilbert Wilson PIR:[id number] DOB: 05/06/1946 ?DOA: 01/01/2022 ?DOS: the patient was seen and examined on 01/01/2022 ?PCP: [facility]  ?Patient coming from: Home ? ?Chief Complaint:  ?Chief Complaint  ?Patient presents with  ? Altered Mental Status  ? Otalgia  ? ?HPI: Gilbert Wilson is a 76 y.o. male with medical history significant of hypertension, atrial fibrillation s/p ablation, CVA with right-sided hemiplegia, DM type II, ESRD s/p renal transplant in 2016, chronic hepatitis C with cirrhosis, liver failure s/p liver transplant in 2016, dementia, seizure disorder, and BPH who presents for altered mental status over the last 2 days.  The patient's wife and daughter are present at bedside and helps provide additional history.  3 days ago patient's urine had looked cloudy, but output at that time was good.  However, later that evening they had noted that the urine output had decreased.  His wife had tried to flush it but was unable to and therefore called the home nursing line.  They came out that night and replace the Foley catheter.  The following day the patient's urine started to get cloudy and dark.  For the last 2 days or so the patient has been more lethargic and had started complaining of abdominal pain.  Family has been able to collect a urine and had dropped it off at the [facility] yesterday for culture.  However due to patient's complaints of abdominal pain and the change in his urine family had the patient a dose of suspected fosfomycin which they had from his ? ? ?Last been hospitalized 3/26 -3/29 due to acute CVA placed on dual antiplatelet therapy.  Per review of records urine cultures from 11/12/2021 had revealed Proteus mirabilis with resistance to ampicillin, nitrofurantoin, and Bactrim. ? ?Upon admission into the emergency department patient was seen to be afebrile, respirations 19-24, and all other vital signs relatively maintained.  Labs  significant for hemoglobin 11.9, sodium 132, CO2 16, BUN 45, creatinine 2.8, glucose 229, and lactic acid 1.5.  Urinalysis significant for moderate hemoglobin, extra large leukocytes, many bacteria, greater than 50 WBCs, and greater than 50 RBCs.  Bladder scan revealed 850 mL of urine present.  After Foley catheter was flushed patient had out over 1000 mL of urine.  Patient had been given 2 L of lactated Ringer's and Rocephin IV. ? ?Review of Systems: unable to review all systems due to the inability of the patient to answer questions. ?Past Medical History:  ?Diagnosis Date  ? CVA (cerebral vascular accident) ([place]) 2019  ? Dementia ([place])   ? 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 treated w/ procedure  ? Hepatitis C   ? HTN (hypertension)   ? Kidney failure   ? Liver failure ([place])   ? Seizure ([place])   ? Tachycardia requiring ablation 2017  ? Type 2 diabetes mellitus treated without insulin ([place])   ? ?Past Surgical History:  ?Procedure Laterality Date  ? KIDNEY TRANSPLANT  2016  ? same time as liver  ? LIVER TRANSPLANT N/A 2016  ? SVT ABLATION  2017  ? ?Social History:  reports that he quit smoking about 38 years ago. His smoking use included cigarettes. He has never used smokeless tobacco. He reports that he does not drink alcohol and does not use drugs. ? ?Allergies  ?Allergen Reactions  ? Levaquin [Levofloxacin] Itching  ? Linezolid   ?  Seizures  ? Lisinopril Cough  ? ? ?Family History  ?Problem Relation Age  of Onset  ? Hypertension Mother   ? Diabetes Mother   ? Diabetes Father   ? ? ?Prior to Admission medications   ?Medication Sig Start Date End Date Taking? Authorizing Provider  ?acetaminophen (TYLENOL) 500 MG tablet Take 1,000 mg by mouth every 6 (six) hours as needed for moderate pain, headache or fever.   Yes [provider]  ?aspirin EC 81 MG EC tablet Take 1 tablet (81 mg total) by mouth daily. Swallow whole. 11/27/21 11/22/22 Yes Barb Merino, MD  ?calcitRIOL (ROCALTROL) 0.25 MCG capsule Take  0.25 mcg by mouth daily.   Yes [provider]  ?clopidogrel (PLAVIX) 75 MG tablet Take 75 mg by mouth daily.   Yes [provider]  ?cycloSPORINE modified (NEORAL) 100 MG capsule Take 150 mg by mouth 2 (two) times daily.   Yes [provider]  ?Fe Bisgly-Succ-C-Thre-B12-FA (IRON-150 PO) Take 1 tablet by mouth daily.   Yes [provider]  ?finasteride (PROSCAR) 5 MG tablet Take 5 mg by mouth every evening.   Yes [provider]  ?hydrALAZINE (APRESOLINE) 50 MG tablet Take 1 tablet (50 mg total) by mouth 3 (three) times daily. ?Patient taking differently: Take 100 mg by mouth 3 (three) times daily. 07/07/21  Yes Lacinda Axon, MD  ?insulin glargine (LANTUS SOLOSTAR) 100 UNIT/ML Solostar Pen Inject 0-6 Units into the skin at bedtime. If BS>130-Take 6 units, Increase 1 unit for every 10 mg/dl above 130.   Yes [provider]  ?levETIRAcetam (KEPPRA) 500 MG tablet Take 500 mg by mouth 2 (two) times daily.   Yes [provider]  ?losartan (COZAAR) 100 MG tablet Take 1 tablet (100 mg total) by mouth daily. ?Patient taking differently: Take 50 mg by mouth 2 (two) times daily. 07/08/21  Yes Lacinda Axon, MD  ?MAGNESIUM OXIDE 400 PO Take 400 mg by mouth daily.   Yes [provider]  ?mirtazapine (REMERON) 15 MG tablet Take 15 mg by mouth at bedtime.   Yes [provider]  ?mycophenolate (MYFORTIC) 180 MG EC tablet Take 180 mg by mouth 2 (two) times daily.   Yes [provider]  ?rosuvastatin (CRESTOR) 10 MG tablet Take 10 mg by mouth every evening.   Yes [provider]  ?sodium bicarbonate 650 MG tablet Take 1,300 mg by mouth 2 (two) times daily.   Yes [provider]  ? ? ?Physical Exam: ?Vitals:  ? 01/01/22 0715 01/01/22 0730 01/01/22 0745 01/01/22 0800  ?BP: (!) 112/55 (!) 126/50 (!) 108/51 (!) 120/58  ?Pulse: 84 88 87 83  ?Resp: 20 (!) 24 20 19   ?Temp:      ?TempSrc:      ?SpO2: 99% 100% 100% 99%   ? ? ?Constitutional: Elderly male currently resting in no acute distress ?Eyes: PERRL, lids and conjunctivae normal ?ENMT: Mucous membranes are dry.  Posterior pharynx clear of any exudate or lesions.   ?Neck: normal, supple.  No JVD ?Respiratory: Mildly tachypneic without significant crackles or rhonchi appreciated.  Saturations currently maintained on room air. ?Cardiovascular: Regular rate and rhythm .No significant lower extremity edema.  Right forearm fistula present with palpable thrill. ?Abdomen: no tenderness, no masses palpated.  Foley catheter in place with cloudy urine present. ?Musculoskeletal: no clubbing / cyanosis.  Contractures present of the right upper extremity. ?Skin: no rashes, lesions, ulcers. No induration ?Neurologic: CN 2-12 grossly intact.  Right-sided hemiplegia ?Psychiatric: Lethargic alert and oriented to self. ? ?Data Reviewed: ? ?EKG revealed sinus rhythm at  77 bpm.  Reviewed patient's labs, imaging, and pertinent labs as noted above in the HPI ? ?Assessment and Plan: ?Complicated urinary tract infection ?Acute.  Patient has chronic indwelling Foley catheter due to history of BPH.  Urinalysis significant for moderate hemoglobin, large leukocytes, many bacteria, greater than 50 RBCs, and greater than 50 WBCs.  Patient was started on empiric antibiotics of Rocephin.  Previous urine culture from last known grew out Proteus mirabilis sensitive to Rocephin.  Patient Foley catheter had recently been exchanged out 2-3 days ago. ?-Admit to a telemetry bed ?-Follow-up blood and urine culture ?-Continue empiric antibiotics of Rocephin IV ? 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  Baseline creatinine previously had been around 1.9.  He has a right upper extremity fistula in place with palpable thrill he was noted to be acutely obstructed which was relieved after advancing his  Foley catheter and having it flushed. ?-Strict I&Os ?-Continue sodium bicarb, calcitriol, mycophenolate, cyclosporine ?-Avoid nephrotoxic agents ?-Normal saline IV fluids at 75 mL/h overnight. ?-Recheck kidney function

## 2022-01-01 NOTE — ED Notes (Signed)
One set of blood cultures collected prior to antibiotic administration due to difficult lab draw.  ?

## 2022-01-01 NOTE — ED Triage Notes (Addendum)
Pt arrived via [facility] for AMS/Sepsis from home. Pt altered at baseline but per family he is more altered than normal - increased lethargy and had reported abdominal pain earlier today with decreased urine output and dark color of output noted. Foley present upon arrival, last changed Sunday. PMH of stroke, bilateral extremity weakness at baseline. Right limb restricted - previous dialysis graft prior to kidney transplant. GCS 14. 20g L FA.  ? ?EMS Vitals ? ?BP 135/70 ?HR 100 ?Spo2 98% RA ?RR 20 ?CBG 296 ?

## 2022-01-01 NOTE — ED Provider Notes (Signed)
?[facility] DEPT ?[facility] Emergency Department ?Provider Note ?MRN:  [id number]  ?Arrival date & time: 01/01/22    ? ?Chief Complaint   ?Altered mental status ?History of Present Illness   ?Gilbert Wilson is a 76 y.o. year-old male with a history of dementia, stroke, kidney and liver transplant presenting to the ED with chief complaint of altered mental status. ? ?Cloudy urine several days ago which has worsened.  Lots of sediment, decreased urine output into the Foley bag, red discoloration today.  Also with worsening altered mental status, somnolence over the past 2 days.  Family tried reaching out to the [place] for a urine culture but were unable to obtain it.  They gave an old dose of fosfomycin at home 2 days ago, not helping.  Temperature elevated today at 100 orally. ? ?Review of Systems  ?I was unable to obtain a full/accurate HPI, PMH, or ROS due to the patient's altered mental status. ? ?Patient's Health History   ? ?Past Medical History:  ?Diagnosis Date  ? CVA (cerebral vascular accident) ([place]) 2019  ? Dementia ([place])   ? 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 treated w/ procedure  ? Hepatitis C   ? HTN (hypertension)   ? Kidney failure   ? Liver failure ([place])   ? Seizure ([place])   ? Tachycardia requiring ablation 2017  ? Type 2 diabetes mellitus treated without insulin ([place])   ?  ?Past Surgical History:  ?Procedure Laterality Date  ? KIDNEY TRANSPLANT  2016  ? same time as liver  ? LIVER TRANSPLANT N/A 2016  ? SVT ABLATION  2017  ?  ?Family History  ?Problem Relation Age of Onset  ? Hypertension Mother   ? Diabetes Mother   ? Diabetes Father   ?  ?Social History  ? ?Socioeconomic History  ? Marital status: Married  ?  Spouse name: Not on file  ? Number of children: Not on file  ? Years of education: Not on file  ? Highest education level: Not on file  ?Occupational History  ? Not on file  ?Tobacco Use  ? Smoking status: Former  ?  Types: Cigarettes  ?  Quit date: 09/02/1983  ?  Years since quitting:  38.3  ? Smokeless tobacco: Never  ?Vaping Use  ? Vaping Use: Never used  ?Substance and Sexual Activity  ? Alcohol use: Never  ? Drug use: Never  ? Sexual activity: Not on file  ?Other Topics Concern  ? Not on file  ?Social History Narrative  ? Not on file  ? ?Social Determinants of Health  ? ?Financial Resource Strain: Not on file  ?Food Insecurity: Not on file  ?Transportation Needs: Not on file  ?Physical Activity: Not on file  ?Stress: Not on file  ?Social Connections: Not on file  ?Intimate Partner Violence: Not on file  ?  ? ?Physical Exam  ? ?Vitals:  ? 01/01/22 0530 01/01/22 0545  ?BP: (!) 114/59 92/61  ?Pulse: 82 83  ?Resp: 18 18  ?Temp:    ?SpO2: 99% 100%  ?  ?CONSTITUTIONAL: Ill-appearing, NAD ?NEURO/PSYCH: Somnolent, wakes to voice, not oriented ?EYES:  eyes equal and reactive ?ENT/NECK:  no LAD, no JVD ?CARDIO: Regular rate, well-perfused, normal S1 and S2 ?PULM:  CTAB no wheezing or rhonchi ?GI/GU: Lower abdominal distention, nontender ?MSK/SPINE:  No gross deformities, no edema ?SKIN:  no rash, atraumatic ? ? ?*Additional and/or pertinent findings included in MDM below ? ?Diagnostic and Interventional Summary  ? ? EKG  Interpretation ? ?Date/Time:  Wednesday Jan 01 2022 05:55:00 EDT ?Ventricular Rate:  77 ?PR Interval:  279 ?QRS Duration: 111 ?QT Interval:  395 ?QTC Calculation: 447 ?R Axis:   -50 ?Text Interpretation: Sinus rhythm Prolonged PR interval Left anterior fascicular block Abnormal R-wave progression, late transition Left ventricular hypertrophy Confirmed by Gerlene Fee [phone]) on 01/01/2022 6:41:48 AM ?  ? ?  ? ?Labs Reviewed  ?COMPREHENSIVE METABOLIC PANEL - Abnormal; Notable for the following components:  ?    Result Value  ? Sodium 132 (*)   ? CO2 16 (*)   ? Glucose, Bld 229 (*)   ? BUN 45 (*)   ? Creatinine, Ser 2.80 (*)   ? Total Protein 6.1 (*)   ? Albumin 2.9 (*)   ? AST 12 (*)   ? GFR, Estimated 23 (*)   ? All other components within normal limits  ?CBC WITH DIFFERENTIAL/PLATELET -  Abnormal; Notable for the following components:  ? Hemoglobin 11.9 (*)   ? HCT 38.3 (*)   ? MCV 77.4 (*)   ? MCH 24.0 (*)   ? RDW 16.2 (*)   ? Neutro Abs 8.2 (*)   ? Monocytes Absolute 1.4 (*)   ? Abs Immature Granulocytes 0.09 (*)   ? All other components within normal limits  ?URINALYSIS, ROUTINE W REFLEX MICROSCOPIC - Abnormal; Notable for the following components:  ? Color, Urine AMBER (*)   ? APPearance TURBID (*)   ? Hgb urine dipstick MODERATE (*)   ? Protein, ur 100 (*)   ? Leukocytes,Ua LARGE (*)   ? RBC / HPF >50 (*)   ? WBC, UA >50 (*)   ? Bacteria, UA MANY (*)   ? All other components within normal limits  ?RESP PANEL BY RT-PCR (FLU A&B, COVID) [id number]  ?CULTURE, BLOOD (ROUTINE X 2)  ?CULTURE, BLOOD (ROUTINE X 2)  ?URINE CULTURE  ?LACTIC ACID, PLASMA  ?LACTIC ACID, PLASMA  ?PROTIME-INR  ?APTT  ?  ?DG Chest Port 1 View  ?Final Result  ?  ?  ?Medications  ?lactated ringers bolus 1,000 mL (1,000 mLs Intravenous New Bag/Given 01/01/22 0359)  ?lactated ringers bolus 1,000 mL (1,000 mLs Intravenous New Bag/Given 01/01/22 0358)  ?cefTRIAXone (ROCEPHIN) 2 g in sodium chloride 0.9 % 100 mL IVPB (0 g Intravenous Stopped 01/01/22 0351)  ?  ? ?Procedures  /  Critical Care ?.Critical Care ?Performed by: Maudie Flakes, MD ?Authorized by: Maudie Flakes, MD  ? ?Critical care provider statement:  ?  Critical care time (minutes):  45 ?  Critical care was necessary to treat or prevent imminent or life-threatening deterioration of the following conditions: Initial concern for possible sepsis. ?  Critical care was time spent personally by me on the following activities:  Development of treatment plan with patient or surrogate, discussions with consultants, evaluation of patient's response to treatment, examination of patient, ordering and review of laboratory studies, ordering and review of radiographic studies, ordering and performing treatments and interventions, pulse oximetry, re-evaluation of patient's condition and review  of old charts ? ?ED Course and Medical Decision Making  ?Initial Impression and Ddx ?The history is suggestive of UTI.  Given patient's immunocompromise status and altered mental status, code sepsis is initiated.  Also considering renal failure given the low urine output recently.  The alternate explanation would be acute urinary retention, bladder scan to follow.  Providing empiric fluids and antibiotics and will monitor closely.  Currently reassuring vital signs. ? ?Past medical/surgical  history that increases complexity of ED encounter: Dementia, stroke ? ?Interpretation of Diagnostics ?I personally reviewed the Chest Xray and my interpretation is as follows: Hypoventilation ?   ?Laboratory assessment reveals largely baseline blood counts, hyponatremia, acute kidney injury.  Urinalysis showing evidence of infection. ? ?Patient Reassessment and Ultimate Disposition/Management ?The patient's Foley catheter was flushed and it then drained over a liter of urine.  Plan is for hospitalist admission. ? ?Patient management required discussion with the following services or consulting groups:  Hospitalist Service ? ?Complexity of Problems Addressed ?Acute illness or injury that poses threat of life of bodily function ? ?Additional Data Reviewed and Analyzed ?Further history obtained from: ?Further history from spouse/family member ? ?Additional Factors Impacting ED Encounter Risk ?Consideration of hospitalization ? ?Barth Kirks. Sedonia Small, MD ?[facility] Emergency Medicine ?[place] ?[email or web address] ? ?Final Clinical Impressions(s) / ED Diagnoses  ? ?  ICD-10-CM   ?1. Altered mental status, unspecified altered mental status type  R41.82   ?  ?2. Lower urinary tract infectious disease  N39.0   ?  ?3. Urinary retention  R33.9   ?  ?4. AKI (acute kidney injury) ([place])  N17.9   ?  ?  ?ED Discharge Orders   ? ? None  ? ?  ?  ? ?Discharge Instructions Discussed with and Provided to Patient:  ? ?Discharge  Instructions   ?None ?  ? ?  ?Maudie Flakes, MD ?01/01/22 [phone] ? ?

## 2022-01-01 NOTE — Progress Notes (Signed)
HOSPITAL MEDICINE OVERNIGHT EVENT NOTE   ? ?Notified by nursing that patient suddenly developed severe hypotension and minimal responsiveness with a BP of 59/41. ? ?Rapid response was immediately notified who promptly came to the patient's bedside.  Patient did exhibit a period of decreased responsiveness which slowly did resolve.  Associated with improving blood pressures with blood pressures improving back to baseline shortly thereafter. ? ?Etiology of this event is unclear.  Rapid response nursing tells me patient is not in any distress.  Rapid response initiated 500 cc bolus of isotonic fluids. ? ?Telemetry reveals no evidence of arrhythmias or severe bradycardia.  Review of medications reveals the patient is on hydralazine 100 mg 3 times daily and therefore this will be temporarily held.  We will continue to monitor patient closely. ? ?Vernelle Emerald  MD ?[facility]  ? ? ? ? ? ? ? ? ? ? ?

## 2022-01-01 NOTE — Sepsis Progress Note (Signed)
Monitoring for the code sepsis protocol. °

## 2022-01-01 NOTE — Significant Event (Signed)
Rapid Response Event Note  ? ?Reason for Call : Unresponsiveness ?Initial Focused Assessment:  ?Nursing staff notified me of pt who will not respond to sternal rub. Upon arrival, pt was comatose and I was able to wake him with noxious stimuli. CBG 156. No narcotics or benzos given. His SBP were 50-70. NS bolus initiated. Pt was able to tell me his name and follow simple commands. Pt's daughter was at bedside and stated he was coming back to his normal baseline mental status. Pt has a history of seizures but no reported seizure like activity was witnessed. Telemetry reviewed personally and no arrhythmias noted other than baseline 1st degree HB with rare missed beats. Discussed with Dr. Cyd Silence.  ? ?2234-HR 85, 125/64 (83), RR 16 with sats 100% on RA ? ?Interventions:  ?-NS 500cc bolus ? ?Plan of Care:  ?-Notify MD for further orders ?-Notify MD and /or RRRN for further assistance.  ? ? ? ?MD Notified: 2237 Dr. Cyd Silence ?Call Time: 2226 ?Arrival Time: 2230 ?End Time: 2300 ? ?Madelynn Done, RN ?

## 2022-01-01 NOTE — ED Notes (Signed)
Bladder scan showed 850cc urine in bladder. RN instructed to flush catheter by MD Bero. RN attempted to flush preexisting foley catheter with 85ml sterile water via Tomey syringe and pain response was noted from patient. RN paused flushing, used 10cc syringe to deflate foley catheter balloon and advanced catheter to bifurcation and re-inflated balloon. Catheter was reconnected to drainage bag and urine began to flow freely into collection bag. Approximately 1000cc urine returned - cloudy, sediment noted, slightly red tinged. MD Bero notified. ?

## 2022-01-02 ENCOUNTER — Inpatient Hospital Stay (HOSPITAL_COMMUNITY): Payer: No Typology Code available for payment source

## 2022-01-02 DIAGNOSIS — R4182 Altered mental status, unspecified: Secondary | ICD-10-CM | POA: Diagnosis not present

## 2022-01-02 LAB — CBC
HCT: 29.5 % — ABNORMAL LOW (ref 39.0–52.0)
Hemoglobin: 9.7 g/dL — ABNORMAL LOW (ref 13.0–17.0)
MCH: 24.5 pg — ABNORMAL LOW (ref 26.0–34.0)
MCHC: 32.9 g/dL (ref 30.0–36.0)
MCV: 74.5 fL — ABNORMAL LOW (ref 80.0–100.0)
Platelets: 194 10*3/uL (ref 150–400)
RBC: 3.96 MIL/uL — ABNORMAL LOW (ref 4.22–5.81)
RDW: 15.7 % — ABNORMAL HIGH (ref 11.5–15.5)
WBC: 8.1 10*3/uL (ref 4.0–10.5)
nRBC: 0 % (ref 0.0–0.2)

## 2022-01-02 LAB — GLUCOSE, CAPILLARY
Glucose-Capillary: 120 mg/dL — ABNORMAL HIGH (ref 70–99)
Glucose-Capillary: 149 mg/dL — ABNORMAL HIGH (ref 70–99)
Glucose-Capillary: 142 mg/dL — ABNORMAL HIGH (ref 70–99)
Glucose-Capillary: 205 mg/dL — ABNORMAL HIGH (ref 70–99)

## 2022-01-02 LAB — RENAL FUNCTION PANEL
Albumin: 2.5 g/dL — ABNORMAL LOW (ref 3.5–5.0)
Anion gap: 9 (ref 5–15)
BUN: 38 mg/dL — ABNORMAL HIGH (ref 8–23)
CO2: 20 mmol/L — ABNORMAL LOW (ref 22–32)
Calcium: 9.3 mg/dL (ref 8.9–10.3)
Chloride: 112 mmol/L — ABNORMAL HIGH (ref 98–111)
Creatinine, Ser: 2.26 mg/dL — ABNORMAL HIGH (ref 0.61–1.24)
GFR, Estimated: 30 mL/min — ABNORMAL LOW (ref >60)
Glucose, Bld: 124 mg/dL — ABNORMAL HIGH (ref 70–99)
Phosphorus: 2.8 mg/dL (ref 2.5–4.6)
Potassium: 3.7 mmol/L (ref 3.5–5.1)
Sodium: 141 mmol/L (ref 135–145)

## 2022-01-02 LAB — FERRITIN: Ferritin: 223 ng/mL (ref 24–336)

## 2022-01-02 LAB — IRON AND TIBC
Iron: 21 ug/dL — ABNORMAL LOW (ref 45–182)
Saturation Ratios: 13 % — ABNORMAL LOW (ref 17.9–39.5)
TIBC: 160 ug/dL — ABNORMAL LOW (ref 250–450)
UIBC: 139 ug/dL

## 2022-01-02 MED ORDER — HEPARIN SODIUM (PORCINE) 5000 UNIT/ML IJ SOLN
5000.0000 [IU] | Freq: Three times a day (TID) | INTRAMUSCULAR | Status: DC
  Administered 2022-01-02 – 2022-01-04 (×8): 5000 [IU] via SUBCUTANEOUS
  Filled 2022-01-02 (×8): qty 1

## 2022-01-02 MED ORDER — CHLORHEXIDINE GLUCONATE CLOTH 2 % EX PADS
6.0000 | MEDICATED_PAD | Freq: Every day | CUTANEOUS | Status: DC
  Administered 2022-01-02 – 2022-01-04 (×3): 6 via TOPICAL

## 2022-01-02 MED ORDER — SODIUM CHLORIDE 0.9 % IV SOLN: INTRAVENOUS | Status: DC

## 2022-01-02 NOTE — Plan of Care (Signed)
  Problem: Clinical Measurements: Goal: Respiratory complications will improve Outcome: Progressing Goal: Cardiovascular complication will be avoided Outcome: Progressing   Problem: Coping: Goal: Level of anxiety will decrease Outcome: Progressing   

## 2022-01-02 NOTE — Procedures (Signed)
Patient Name: Gilbert Wilson  ?MRN: [id number]  ?Epilepsy Attending: Lora Havens  ?Referring Physician/Provider: Lavina Hamman, MD ?Date: 01/02/2022 ?Duration: 23.17 mins ? ?Patient history: 76yo M with h/o seizure had an episode of alteration of awareness. EEG to evaluate for seizure ? ?Level of alertness: Awake ? ?AEDs during EEG study: LEV ? ?Technical aspects: This EEG study was done with scalp electrodes positioned according to the 10-20 International system of electrode placement. Electrical activity was acquired at a sampling rate of 500Hz  and reviewed with a high frequency filter of 70Hz  and a low frequency filter of 1Hz . EEG data were recorded continuously and digitally stored.  ? ?Description: The posterior dominant rhythm consists of 8 Hz activity of moderate voltage (25-35 uV) seen predominantly in posterior head regions, symmetric and reactive to eye opening and eye closing. EEG showed intermittent generalized 5 to 7 Hz theta slowing. Hyperventilation and photic stimulation were not performed.    ? ?ABNORMALITY ?- Intermittent slow, generalized ? ?IMPRESSION: ?This study is suggestive of mild diffuse encephalopathy, nonspecific etiology. No seizures or epileptiform discharges were seen throughout the recording. ? ?Lora Havens  ? ?

## 2022-01-02 NOTE — Hospital Course (Signed)
Gilbert Wilson is a 76 y.o. male with medical history significant of hypertension, atrial fibrillation s/p ablation, CVA with right-sided hemiplegia, DM type II, ESRD s/p renal transplant in 2016, chronic hepatitis C with cirrhosis, liver failure s/p liver transplant in 2016, dementia, seizure disorder, and BPH who presents for altered mental status over the last 2 days. ?Found to have a UTI. ?Also had an unresponsive episode in the hospital on 5/3. ?

## 2022-01-02 NOTE — Progress Notes (Signed)
Rapid Response activated for unresponsiveness and hypotension. RN spoke with pt and daughter prior to administering night meds. Pt was able to open his eyes and nod appropriately. Upon returning to room with drink and meds, pt did not arouse with verbal and tactile stimuli. Sternal rub, hard pinching on upper and lower extremities did not yield any response from pt. Pupils noted to be pinpoint and sluggish. No benzos or opioids intake noted on MAR. Rapid nurse arrived to bedside very promptly and was able to arouse pt. ( See RRRN note). NS bolus given, pt placed on bedside monitoring with cyclic BP every hour. Pt was able to take po meds without difficulty. Hydralazine and  ?Mirtizapine help d/t hypotension and somnolence.   ?

## 2022-01-02 NOTE — Progress Notes (Signed)
EEG complete - results pending 

## 2022-01-02 NOTE — Progress Notes (Signed)
?Progress Note ?Patient: Gilbert Wilson ONG:[id number] DOB: [birth year] DOA: 01/01/2022  ?DOS: the patient was seen and examined on 01/02/2022 ? ?Brief hospital course: ? Tru Rana is a 76 y.o. male with medical history significant of hypertension, atrial fibrillation s/p ablation, CVA with right-sided hemiplegia, DM type II, ESRD s/p renal transplant in 2016, chronic hepatitis C with cirrhosis, liver failure s/p liver transplant in 2016, dementia, seizure disorder, and BPH who presents for altered mental status over the last 2 days. ?Found to have a UTI. ?Also had an unresponsive episode in the hospital on 5/3. ? ?Assessment and Plan: ?Complicated urinary tract infection ?Ongoing issues since last Friday. ?Chronic indwelling Foley catheter due to BPH. ?Family noted discoloration and drainage issues. ?Catheter changed on Sunday. ?Continues to have cloudy urine therefore family sent a urine sample to [place]. ?On admission appears to have Proteus growing in the urine culture. ?Continue with IV antibiotics. ?Anticipate improvement in mentation. ? ?Acute metabolic encephalopathy. ?Unresponsive event. ?Patient presents with confusion and lethargy most likely secondary to combination of UTI as well as dehydration and acute kidney injury on chronic kidney disease. ?Hypotension also contributing. ?On 5/3 patient had an witnessed unresponsive event without any loss of control of bowel or bladder with gradual improvement in his mentation. ?CT of the head unremarkable.  EEG negative for any acute seizures.  Anticipating medication side effect as a potential etiology and therefore holding Remeron. ?  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  Baseline creatinine previously had been around 1.9.  Likely combination of dehydration from poor p.o. intake, UTI as well as urinary obstruction from malfunctioning Foley  catheter and diarrhea. ?Continue with IV fluids. ? ?Right lower extremity edema. ?We will check Doppler to rule out DVT.  Continue with IV fluids for now. ?  ?History of chronic hepatitis C ?History of cirrhosis ?History of liver failure ?Liver transplant on chronic immunosuppressive therapy ?LFTs appear to be stable at this time.  Home regimen includes mycophenolate 80 mg twice daily and cyclosporine 150 mg twice daily.  ?-Continue current medication regimen ? ?Hyponatremia ?Acute.  Sodium 132.  Suspect secondary to decreased intake with reports of diarrhea. ?-Normal saline IV fluids as noted above ?  ?Microcytic anemia ?Chronic.  Hemoglobin 11.9 g/dL which appears improved from last available.  Continue to monitor. ?  ?Essential hypertension ?Home medication regimen includes hydralazine 100 mg 3 times daily and losartan 50 mg twice daily. ?Holding antihypertensive regimen in the setting of infection, AKI as well as soft blood pressure. ?  ?Metabolic acidosis ?CO2 16 without elevated anion gap.  Suspect secondary to chronic kidney disease. ?-Continue sodium bicarb ?  ?Diabetes mellitus type 2, well controlled with long-term insulin use with CKD. ?Last hemoglobin A1c 6.5 on 11/25/2021.  Home regimen includes sliding scale glargine. ?-Hypoglycemic protocols continue sliding scale insulin holding long-acting insulin. ?  ?History of CVA with residual deficit ?Patient with prior history of stroke in 2019 with residual right-sided hemiplegia.  He was noted to have suffered acute infarct of the right corona radiata by MRI on 3/26.  He is placed on dual antiplatelet therapy due to failed monotherapy ?-Continue Plavix, aspirin, and statin ?  ?History of seizure ?No seizure-like activity reported ?-Continue Keppra ?  ?Dementia ?-Delirium precautions ?  ?BPH, with lower urinary tract symptoms ?-Continue Foley catheter ?-Continue Proscar ?  ?Hyperlipidemia ?-Continue Crestor ? ?Subjective: Denies any acute complaint.  Interactive.  Answers questions in yes and no.  No acute complaint.  Able to tell me his name. ? ?Physical Exam: ?Vitals:  ? 01/02/22 1507 01/02/22 1512 01/02/22 1700 01/02/22 2000  ?BP:  (!) 146/39  137/71  ?Pulse:  75  75  ?Resp:  19 (!) 21 (!) 21  ?Temp: 99.1 ?F (37.3 ?C)   98.7 ?F (37.1 ?C)  ?TempSrc: Oral   Oral  ?SpO2:  98% 100% 100%  ? ?General: Appear in mild distress; no visible Abnormal Neck Mass Or lumps, Conjunctiva normal ?Cardiovascular: S1 and S2 Present, no Murmur, ?Respiratory: good respiratory effort, Bilateral Air entry present and CTA, no Crackles, no wheezes ?Abdomen: Bowel Sound present, Non tender ?Extremities: Right pedal edema ?Neurology: alert and oriented to place and person ?Gait not checked due to patient safety concerns  ? ?Data Reviewed: ?I have Reviewed nursing notes, Vitals, and Lab results since pt's last encounter. Pertinent lab results CBC and BMP ?I have ordered test including EEG, CBC, BMP ?I have ordered imaging studies CT head.  ? ?Family Communication: Family at bedside ? ?Disposition: ?Status is: Inpatient ?Remains inpatient appropriate because: Need further work-up with regards to his encephalopathy as well as ongoing treatment for UTI with IV antibiotics ? ?Author: ?Berle Mull, MD ?01/02/2022 8:18 PM ? ?Please look on [email or web address] to find out who is on call. ?

## 2022-01-03 ENCOUNTER — Encounter (HOSPITAL_COMMUNITY): Payer: Medicare Other

## 2022-01-03 DIAGNOSIS — G9340 Encephalopathy, unspecified: Secondary | ICD-10-CM | POA: Diagnosis not present

## 2022-01-03 DIAGNOSIS — I441 Atrioventricular block, second degree: Secondary | ICD-10-CM

## 2022-01-03 LAB — GLUCOSE, CAPILLARY
Glucose-Capillary: 152 mg/dL — ABNORMAL HIGH (ref 70–99)
Glucose-Capillary: 106 mg/dL — ABNORMAL HIGH (ref 70–99)
Glucose-Capillary: 143 mg/dL — ABNORMAL HIGH (ref 70–99)
Glucose-Capillary: 164 mg/dL — ABNORMAL HIGH (ref 70–99)
Glucose-Capillary: 142 mg/dL — ABNORMAL HIGH (ref 70–99)

## 2022-01-03 LAB — C DIFFICILE QUICK SCREEN W PCR REFLEX
C Diff antigen: NEGATIVE
C Diff interpretation: NOT DETECTED
C Diff toxin: NEGATIVE

## 2022-01-03 LAB — CBC
HCT: 26.8 % — ABNORMAL LOW (ref 39.0–52.0)
Hemoglobin: 8.6 g/dL — ABNORMAL LOW (ref 13.0–17.0)
MCH: 24.1 pg — ABNORMAL LOW (ref 26.0–34.0)
MCHC: 32.1 g/dL (ref 30.0–36.0)
MCV: 75.1 fL — ABNORMAL LOW (ref 80.0–100.0)
Platelets: 184 10*3/uL (ref 150–400)
RBC: 3.57 MIL/uL — ABNORMAL LOW (ref 4.22–5.81)
RDW: 15.7 % — ABNORMAL HIGH (ref 11.5–15.5)
WBC: 6 10*3/uL (ref 4.0–10.5)
nRBC: 0 % (ref 0.0–0.2)

## 2022-01-03 LAB — RENAL FUNCTION PANEL
Albumin: 2.3 g/dL — ABNORMAL LOW (ref 3.5–5.0)
Anion gap: 9 (ref 5–15)
BUN: 38 mg/dL — ABNORMAL HIGH (ref 8–23)
CO2: 18 mmol/L — ABNORMAL LOW (ref 22–32)
Calcium: 9.2 mg/dL (ref 8.9–10.3)
Chloride: 114 mmol/L — ABNORMAL HIGH (ref 98–111)
Creatinine, Ser: 2.01 mg/dL — ABNORMAL HIGH (ref 0.61–1.24)
GFR, Estimated: 34 mL/min — ABNORMAL LOW (ref >60)
Glucose, Bld: 107 mg/dL — ABNORMAL HIGH (ref 70–99)
Phosphorus: 3.1 mg/dL (ref 2.5–4.6)
Potassium: 4.2 mmol/L (ref 3.5–5.1)
Sodium: 141 mmol/L (ref 135–145)

## 2022-01-03 LAB — URINE CULTURE: Culture: >=100000 — AB

## 2022-01-03 LAB — MAGNESIUM: Magnesium: 1.8 mg/dL (ref 1.7–2.4)

## 2022-01-03 MED ORDER — MIRTAZAPINE 15 MG PO TABS
7.5000 mg | ORAL_TABLET | Freq: Every day | ORAL | Status: DC
  Administered 2022-01-03 – 2022-01-04 (×2): 7.5 mg via ORAL
  Filled 2022-01-03 (×2): qty 1

## 2022-01-03 NOTE — Progress Notes (Signed)
EKG was done due to patient's HR dip down to 30's intermit for about 10 seconds than goes up to 40-50's then down to the 30 's with irregular rate. MD Shalhoub made aware patient's EKG changes. Patient is asymptomatic. Will continue to monitor  ?

## 2022-01-03 NOTE — Progress Notes (Signed)
HOSPITAL MEDICINE OVERNIGHT EVENT NOTE   ? ?Contacted by nursing and notified that for the past several hours telemetry has been picking up on bouts of severe bradycardia. ? ?Patient has been exhibiting occasional episodes of heart rates that dipped into the 30s.  Heart rate then recovers into the 50s.  For at least the past several hours patient seems to be intermittently going between first-degree and second-degree type I heart block. ? ?Nursing reports no associated symptoms of lightheadedness or extreme lethargy over the span of time.  Patient is not exhibiting any evidence of hemodynamic instability. ? ?Typically, in this situation no intervention would be required.  That being said, patient did exhibit an unexplained episode of minimal responsiveness the morning of 5/4.  Considering the rhythm changes noted above, day team to review and determine if a formal cardiology consultation is warranted in the morning. ? ?For now as long as patient is hemodynamically stable and asymptomatic we will continue to monitor and provide supportive care. ? ?Vernelle Emerald  MD ?[facility]  ? ? ? ? ? ? ? ? ? ? ?

## 2022-01-03 NOTE — Progress Notes (Signed)
?Progress Note ?Patient: Gilbert Wilson DUK:[id number] DOB: 01/29/1946 DOA: 01/01/2022  ?DOS: the patient was seen and examined on 01/03/2022 ? ?Brief hospital course: ? Bertha Earwood is a 76 y.o. male with medical history significant of hypertension, atrial fibrillation s/p ablation, CVA with right-sided hemiplegia, DM type II, ESRD s/p renal transplant in 2016, chronic hepatitis C with cirrhosis, liver failure s/p liver transplant in 2016, dementia, seizure disorder, and BPH who presents for altered mental status over the last 2 days. ?Found to have a UTI. ?Also had an unresponsive episode in the hospital on 5/3. ? ?Assessment and Plan: ?Complicated urinary tract infection due to UTI. ?Ongoing issues since last Friday. ?Chronic indwelling Foley catheter due to BPH. ?Family noted discoloration and drainage issues. ?Catheter changed on Sunday. ?Continues to have cloudy urine therefore family sent a urine sample to [place]. ?Urine culture growing Proteus mirabilis this is essentially the same organism that was present in March based on sensitivity and thus I suspect this is mostly a colonizer. ?Continue with IV antibiotics. ?Anticipate improvement in mentation. ?  ?Acute metabolic encephalopathy. ?Unresponsive event. ?Patient presents with confusion and lethargy most likely secondary to combination of UTI as well as dehydration and acute kidney injury on chronic kidney disease. ?Hypotension also contributing. ?On 5/3 patient had an witnessed unresponsive event without any loss of control of bowel or bladder with gradual improvement in his mentation. ?CT of the head unremarkable.  EEG negative for any acute seizures.  Anticipating medication side effect as a potential etiology and therefore holding Remeron. ? ?Sinus bradycardia. ?Second-degree heart block ?Appreciate EP consultation. ?Patient is with history of dementia mostly bedbound. ?Agreeably not a good candidate for pacemaker implant especially in the setting of  recurrent infections with UTI due to his indwelling Foley catheter. ? 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  Baseline creatinine previously had been around 1.9.  Likely combination of dehydration from poor p.o. intake, UTI as well as urinary obstruction from malfunctioning Foley catheter and diarrhea. ?Hold IV fluids. ? ?Right lower extremity edema. ?We will check Doppler to rule out DVT.  Continue with IV fluids for now. ?  ?History of chronic hepatitis C ?History of cirrhosis ?History of liver failure ?Liver transplant on chronic immunosuppressive therapy ?LFTs appear to be stable at this time.  Home regimen includes mycophenolate 80 mg twice daily and cyclosporine 150 mg twice daily.  ?-Continue current medication regimen ? ?Hyponatremia ?Acute.  Sodium 132.  Suspect secondary to decreased intake with reports of diarrhea. ?-Normal saline IV fluids as noted above ?  ?Microcytic anemia ?Chronic.  Hemoglobin 11.9 g/dL which appears improved from last available.  Continue to monitor. ?  ?Essential hypertension ?Home medication regimen includes hydralazine 100 mg 3 times daily and losartan 50 mg twice daily. ?Holding antihypertensive regimen in the setting of infection, AKI as well as soft blood pressure. ?  ?Metabolic acidosis ?CO2 16 without elevated anion gap.  Suspect secondary to chronic kidney disease. ?-Continue sodium bicarb ?  ?Diabetes mellitus type 2, well controlled with long-term insulin use with CKD. ?Last hemoglobin A1c 6.5 on 11/25/2021.  Home regimen includes sliding scale glargine. ?-Hypoglycemic protocols continue sliding scale insulin holding long-acting insulin. ?  ?History of CVA with residual deficit ?Patient with prior history of stroke in 2019 with residual right-sided hemiplegia.  He was noted to have suffered acute infarct of the right corona radiata by MRI on 3/26.  He  is placed on dual antiplatelet therapy due to failed monotherapy ?-Continue Plavix, aspirin, and statin ?  ?History of seizure ?No seizure-like activity reported ?-Continue Keppra ?  ?Dementia ?-Delirium precautions ?  ?BPH, with lower urinary tract symptoms ?-Continue Foley catheter ?-Continue Proscar ?  ?Hyperlipidemia ?-Continue Crestor ? ?Subjective: No acute complaint no nausea no vomiting.  Telemetry monitoring was showing concern for second-degree heart block.  Patient denied any acute complaint.  Oral intake still minimal.  No diarrhea. ? ?Physical Exam: ?Vitals:  ? 01/03/22 1235 01/03/22 1335 01/03/22 1435 01/03/22 1535  ?BP: (!) 154/63 (!) 165/58 (!) 173/53 (!) 168/46  ?Pulse:      ?Resp: 16 (!) 23 (!) 22 (!) 22  ?Temp: 98.2 ?F (36.8 ?C)   98.6 ?F (37 ?C)  ?TempSrc:    Oral  ?SpO2: 100% 100% 100% 100%  ? ?General: Appear in mild distress; no visible Abnormal Neck Mass Or lumps, Conjunctiva normal ?Cardiovascular: S1 and S2 Present, no Murmur, ?Respiratory: good respiratory effort, Bilateral Air entry present and CTA, no Crackles, no wheezes ?Abdomen: Bowel Sound present, Non tender  ?Extremities: right Pedal edema ?Neurology: alert and oriented to self ?Gait not checked due to patient safety concerns  ? ?Data Reviewed: ?I have Reviewed nursing notes, Vitals, and Lab results since pt's last encounter. Pertinent lab results CBC and BMP ?I have ordered test including CBC and BMP ?I have discussed pt's care plan and test results with cardiology.  ? ?Family Communication: Daughter at bedside ? ?Disposition: ?Status is: Inpatient ?Remains inpatient appropriate because: Continue to engage with the family with regards to goals of care and treating UTI with IV antibiotics. ? ?Author: ?Berle Mull, MD ?01/03/2022 6:49 PM ? ?Please look on [email or web address] to find out who is on call. ?

## 2022-01-03 NOTE — Progress Notes (Signed)
?  Transition of Care (TOC) Screening Note ? ? ?Patient Details  ?Name: Gilbert Wilson ?Date of Birth: February 03, 1946 ? ? ?Transition of Care (TOC) CM/SW Contact:    ?Pollie Friar, RN ?Phone Number: ?01/03/2022, 2:15 PM ? ? ? ?Transition of Care Department [facility]) has reviewed patient and no TOC needs have been identified at this time. We will continue to monitor patient advancement through interdisciplinary progression rounds. If new patient transition needs arise, please place a TOC consult. ?Pt is active with [facility] clinic:: Dr Radford Pax   CSW: Krystal Eaton [phone] ext [phone] ?  ?

## 2022-01-03 NOTE — Consult Note (Addendum)
? ?ELECTROPHYSIOLOGY CONSULT NOTE  ? ? ?Patient ID: Gilbert Wilson ?MRN: [id number], DOB/AGE: 1946/04/05 76 y.o. ? ?Admit date: 01/01/2022 ?Date of Consult: 01/03/2022 ? ?Primary Physician: [facility] ?Primary Cardiologist: Buford Dresser, MD  ?Electrophysiologist: Dr. Caryl Comes ? ?Referring Provider: Dr. Posey Pronto ? ?Patient Profile: ?Gilbert Wilson is a 76 y.o. male with a history of hypertension, atrial fibrillation s/p ablation, CVA with right-sided hemiplegia, DM type II, ESRD s/p renal transplant in 2016, chronic hepatitis C with cirrhosis, liver failure s/p liver transplant in 2016, dementia, seizure disorder, and BPH who is being seen today for the evaluation of Mobitz 1 at the request of Dr. Posey Pronto. ? ?HPI:  ?Gilbert Wilson is a 76 y.o. male with medical history as above.  ? ?Seen by EP during admission in 07/2021 for episodes of near syncope and finding of second degree / high grade HB. Pt has known history of seizures and orthostatic near-syncope associated w/ incontinence of bowel/bladder. He spends prolonged periods of time supine or seated.   Pt was felt to primarily have Mobitz 1 second degree AV block, and his "episodes" more likely related to orthostasis. ? Raised of autonomic component and possible lewy-body component of his dementia.  Nifedipine stopped in this setting.  ? ?Pt presented this admission for AMS and found to have UTI.  He had an episode of unresponsive on 5/3 (normal HR at the time) with gradual improvement with IVF. He has a chronic indwelling Foley.  AKI noted with peak at 2.8, trending down with IVF. Electrolytes OK.  ? ?Overnight pt noted to have HRs into 30s with Mobitz 1. EP consulted for recommendations.  ? ?Pt is awake and alert but does not participate in conversation.  Daughter and Wife are present and remember speaking during his prior admission. Re-assurance given that this is much the same, and HRs are actually better this admission than previous. He has not had  frank syncope apart from episode of unresponsive this admission.  Telemetry unremarkable at that time.  ? ?Past Medical History:  ?Diagnosis Date  ? CVA (cerebral vascular accident) ([place]) 2019  ? Dementia ([place])   ? 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 treated w/ procedure  ? Hepatitis C   ? HTN (hypertension)   ? Kidney failure   ? Liver failure ([place])   ? Seizure ([place])   ? Tachycardia requiring ablation 2017  ? Type 2 diabetes mellitus treated without insulin ([place])   ?  ? ?Surgical History:  ?Past Surgical History:  ?Procedure Laterality Date  ? KIDNEY TRANSPLANT  2016  ? same time as liver  ? LIVER TRANSPLANT N/A 2016  ? SVT ABLATION  2017  ?  ? ?Medications Prior to Admission  ?Medication Sig Dispense Refill Last Dose  ? acetaminophen (TYLENOL) 500 MG tablet Take 1,000 mg by mouth every 6 (six) hours as needed for moderate pain, headache or fever.   12/31/2021  ? aspirin EC 81 MG EC tablet Take 1 tablet (81 mg total) by mouth daily. Swallow whole. 30 tablet 11 12/31/2021  ? calcitRIOL (ROCALTROL) 0.25 MCG capsule Take 0.25 mcg by mouth daily.   12/31/2021  ? clopidogrel (PLAVIX) 75 MG tablet Take 75 mg by mouth daily.   12/31/2021  ? cycloSPORINE modified (NEORAL) 100 MG capsule Take 150 mg by mouth 2 (two) times daily.   12/31/2021  ? Fe Bisgly-Succ-C-Thre-B12-FA (IRON-150 PO) Take 1 tablet by mouth daily.   12/31/2021  ? finasteride (PROSCAR) 5 MG tablet Take 5 mg by  mouth every evening.   12/31/2021  ? hydrALAZINE (APRESOLINE) 50 MG tablet Take 1 tablet (50 mg total) by mouth 3 (three) times daily. (Patient taking differently: Take 100 mg by mouth 3 (three) times daily.) 90 tablet 3 12/31/2021  ? insulin glargine (LANTUS SOLOSTAR) 100 UNIT/ML Solostar Pen Inject 0-6 Units into the skin at bedtime. If BS>130-Take 6 units, Increase 1 unit for every 10 mg/dl above 130.   12/31/2021  ? levETIRAcetam (KEPPRA) 500 MG tablet Take 500 mg by mouth 2 (two) times daily.   12/31/2021  ? losartan (COZAAR) 100 MG tablet Take 1 tablet (100 mg  total) by mouth daily. (Patient taking differently: Take 50 mg by mouth 2 (two) times daily.) 90 tablet 1 12/31/2021  ? MAGNESIUM OXIDE 400 PO Take 400 mg by mouth daily.   12/31/2021  ? mirtazapine (REMERON) 15 MG tablet Take 15 mg by mouth at bedtime.   12/30/2021  ? mycophenolate (MYFORTIC) 180 MG EC tablet Take 180 mg by mouth 2 (two) times daily.   12/31/2021  ? rosuvastatin (CRESTOR) 10 MG tablet Take 10 mg by mouth every evening.   12/30/2021  ? sodium bicarbonate 650 MG tablet Take 1,300 mg by mouth 2 (two) times daily.   12/31/2021  ? ? ?Inpatient Medications:  ? aspirin EC  81 mg Oral Daily  ? calcitRIOL  0.25 mcg Oral Daily  ? Chlorhexidine Gluconate Cloth  6 each Topical Daily  ? clopidogrel  75 mg Oral Daily  ? cycloSPORINE modified  150 mg Oral BID  ? finasteride  5 mg Oral QPM  ? heparin injection (subcutaneous)  5,000 Units Subcutaneous Q8H  ? insulin aspart  0-6 Units Subcutaneous TID WC  ? levETIRAcetam  500 mg Oral BID  ? mirtazapine  7.5 mg Oral QHS  ? mycophenolate  180 mg Oral BID  ? rosuvastatin  10 mg Oral QPM  ? saccharomyces boulardii  250 mg Oral BID  ? sodium bicarbonate  1,300 mg Oral BID  ? ? ?Allergies:  ?Allergies  ?Allergen Reactions  ? Levaquin [Levofloxacin] Itching  ? Linezolid   ?  Seizures  ? Lisinopril Cough  ? ? ?Social History  ? ?Socioeconomic History  ? Marital status: Married  ?  Spouse name: Not on file  ? Number of children: Not on file  ? Years of education: Not on file  ? Highest education level: Not on file  ?Occupational History  ? Not on file  ?Tobacco Use  ? Smoking status: Former  ?  Types: Cigarettes  ?  Quit date: 09/02/1983  ?  Years since quitting: 38.3  ? Smokeless tobacco: Never  ?Vaping Use  ? Vaping Use: Never used  ?Substance and Sexual Activity  ? Alcohol use: Never  ? Drug use: Never  ? Sexual activity: Not on file  ?Other Topics Concern  ? Not on file  ?Social History Narrative  ? Not on file  ? ?Social Determinants of Health  ? ?Financial Resource Strain: Not on  file  ?Food Insecurity: Not on file  ?Transportation Needs: Not on file  ?Physical Activity: Not on file  ?Stress: Not on file  ?Social Connections: Not on file  ?Intimate Partner Violence: Not on file  ?  ? ?Family History  ?Problem Relation Age of Onset  ? Hypertension Mother   ? Diabetes Mother   ? Diabetes Father   ?  ? ?Review of Systems: ?All other systems reviewed and are otherwise negative except as noted above. ? ?  Physical Exam: ?Vitals:  ? 01/02/22 2309 01/03/22 0300 01/03/22 0440 01/03/22 2458  ?BP: (!) 153/69  (!) 156/62 (!) 155/63  ?Pulse: 80  (!) 57 68  ?Resp: 20  (!) 24 19  ?Temp: 99 ?F (37.2 ?C)  98.6 ?F (37 ?C) 98.9 ?F (37.2 ?C)  ?TempSrc: Oral Oral Oral Oral  ?SpO2: 99%  99% 100%  ? ? ?GEN- The patient is well appearing, alert and oriented x 3 today.   ?HEENT: normocephalic, atraumatic; sclera clear, conjunctiva pink; hearing intact; oropharynx clear; neck supple ?Lungs- Clear to ausculation bilaterally, normal work of breathing.  No wheezes, rales, rhonchi ?Heart- Regular rate and rhythm, no murmurs, rubs or gallops ?GI- soft, non-tender, non-distended, bowel sounds present ?Extremities- no clubbing, cyanosis, or edema; DP/PT/radial pulses 2+ bilaterally ?MS- no significant deformity or atrophy ?Skin- warm and dry, no rash or lesion ?Psych- euthymic mood, full affect ?Neuro- strength and sensation are intact ? ?Labs: ?  ?Lab Results  ?Component Value Date  ? WBC 6.0 01/03/2022  ? HGB 8.6 (L) 01/03/2022  ? HCT 26.8 (L) 01/03/2022  ? MCV 75.1 (L) 01/03/2022  ? PLT 184 01/03/2022  ?  ?Recent Labs  ?Lab 01/01/22 ?0321 01/02/22 ?0998 01/03/22 ?0320  ?NA 132*   < > 141  ?K 4.1   < > 4.2  ?CL 105   < > 114*  ?CO2 16*   < > 18*  ?BUN 45*   < > 38*  ?CREATININE 2.80*   < > 2.01*  ?CALCIUM 9.7   < > 9.2  ?PROT 6.1*  --   --   ?BILITOT 0.8  --   --   ?ALKPHOS 63  --   --   ?ALT 7  --   --   ?AST 12*  --   --   ?GLUCOSE 229*   < > 107*  ? < > = values in this interval not displayed.  ? ? ?   ?Radiology/Studies: CT HEAD WO CONTRAST (5MM) ? ?Result Date: 01/02/2022 ?CLINICAL DATA:  Altered mental status EXAM: CT HEAD WITHOUT CONTRAST TECHNIQUE: Contiguous axial images were obtained from the base of the skull thr

## 2022-01-04 ENCOUNTER — Inpatient Hospital Stay (HOSPITAL_COMMUNITY): Payer: No Typology Code available for payment source

## 2022-01-04 DIAGNOSIS — G9340 Encephalopathy, unspecified: Secondary | ICD-10-CM | POA: Diagnosis not present

## 2022-01-04 DIAGNOSIS — M619 Calcification and ossification of muscle, unspecified: Secondary | ICD-10-CM

## 2022-01-04 LAB — GLUCOSE, CAPILLARY
Glucose-Capillary: 101 mg/dL — ABNORMAL HIGH (ref 70–99)
Glucose-Capillary: 156 mg/dL — ABNORMAL HIGH (ref 70–99)
Glucose-Capillary: 140 mg/dL — ABNORMAL HIGH (ref 70–99)
Glucose-Capillary: 206 mg/dL — ABNORMAL HIGH (ref 70–99)

## 2022-01-04 LAB — MAGNESIUM: Magnesium: 1.7 mg/dL (ref 1.7–2.4)

## 2022-01-04 LAB — RENAL FUNCTION PANEL
Albumin: 2.3 g/dL — ABNORMAL LOW (ref 3.5–5.0)
Anion gap: 4 — ABNORMAL LOW (ref 5–15)
BUN: 35 mg/dL — ABNORMAL HIGH (ref 8–23)
CO2: 21 mmol/L — ABNORMAL LOW (ref 22–32)
Calcium: 9.3 mg/dL (ref 8.9–10.3)
Chloride: 117 mmol/L — ABNORMAL HIGH (ref 98–111)
Creatinine, Ser: 1.84 mg/dL — ABNORMAL HIGH (ref 0.61–1.24)
GFR, Estimated: 38 mL/min — ABNORMAL LOW (ref >60)
Glucose, Bld: 100 mg/dL — ABNORMAL HIGH (ref 70–99)
Phosphorus: 3.1 mg/dL (ref 2.5–4.6)
Potassium: 3.7 mmol/L (ref 3.5–5.1)
Sodium: 142 mmol/L (ref 135–145)

## 2022-01-04 MED ORDER — CEFDINIR 300 MG PO CAPS
300.0000 mg | ORAL_CAPSULE | Freq: Every day | ORAL | 0 refills | Status: AC
Start: 2022-01-04 — End: 2022-01-08

## 2022-01-04 MED ORDER — HYDRALAZINE HCL 25 MG PO TABS
25.0000 mg | ORAL_TABLET | Freq: Three times a day (TID) | ORAL | Status: DC
  Administered 2022-01-04 (×3): 25 mg via ORAL
  Filled 2022-01-04 (×3): qty 1

## 2022-01-04 MED ORDER — SACCHAROMYCES BOULARDII 250 MG PO CAPS: 250.0000 mg | ORAL_CAPSULE | Freq: Two times a day (BID) | ORAL | 0 refills | Status: AC

## 2022-01-04 MED ORDER — HYDRALAZINE HCL 50 MG PO TABS: 50.0000 mg | ORAL_TABLET | Freq: Three times a day (TID) | ORAL | 3 refills | Status: DC

## 2022-01-04 MED ORDER — CEFDINIR 300 MG PO CAPS
300.0000 mg | ORAL_CAPSULE | Freq: Every day | ORAL | Status: DC
  Administered 2022-01-04: 300 mg via ORAL
  Filled 2022-01-04: qty 1

## 2022-01-04 MED ORDER — MIRTAZAPINE 15 MG PO TABS: 7.5000 mg | ORAL_TABLET | Freq: Every day | ORAL | 0 refills | Status: DC

## 2022-01-04 NOTE — TOC Transition Note (Signed)
Transition of Care (TOC) - CM/SW Discharge Note ? ? ?Patient Details  ?Name: Gilbert Wilson ?MRN: [id number] ?Date of Birth: November 06, 1945 ? ?Transition of Care (TOC) CM/SW Contact:  ?Carles Collet, RN ?Phone Number: ?01/04/2022, 1:23 PM ? ? ?Clinical Narrative:   Spoke w patient's wife and she states that all [facility] services have been set up through the [place] prior to admission. They will need PTAR for transport. Address verified and PTAR set up, forms left at nurse's station. Bedside nurse aware. ?No other TOC needs ? ? ? ?  ?  ? ? ?Patient Goals and CMS Choice ?  ?  ?  ? ?Discharge Placement ?  ?           ?  ?  ?  ?  ? ?Discharge Plan and Services ?  ?  ?           ?  ?  ?  ?  ?  ?  ?  ?  ?  ?  ? ?Social Determinants of Health (SDOH) Interventions ?  ? ? ?Readmission Risk Interventions ? ?  11/27/2021  ? 10:20 AM  ?Readmission Risk Prevention Plan  ?Transportation Screening Complete  ?PCP or Specialist Appt within 3-5 Days Complete  ?[place] or Home Care Consult Complete  ?Social Work Consult for [place] Planning/Counseling Complete  ?Palliative Care Screening Not Applicable  ?Medication Review Press photographer) Complete  ? ? ? ? ? ?

## 2022-01-04 NOTE — Progress Notes (Signed)
HOSPITAL MEDICINE OVERNIGHT EVENT NOTE   ? ?Nursing reporting that patient has been exhibiting bouts of recurrent second-degree Mobitz type I heart block throughout the evening shift.  This has been associated with heart rates to decrease into the 30s. ? ?Nursing reports the patient continues to be arousable without any change in symptoms.  Patient remains hemodynamically stable. ? ?We will obtain a twelve-lead EKG but otherwise we will continue to provide supportive care.  Electrophysiology is rounding on this patient daily and can review the EKG as well as telemetry strips later this morning. ? ?Vernelle Emerald  MD ?[facility]  ? ? ? ? ? ? ? ? ? ? ?

## 2022-01-04 NOTE — Progress Notes (Signed)
Lower extremity venous right study completed. ? ?Preliminary results relayed to Posey Pronto, MD. ? ?See CV Proc for preliminary results report.  ? ?Darlin Coco, RDMS, RVT ? ?

## 2022-01-04 NOTE — Discharge Summary (Signed)
?Physician Discharge Summary ?  ?Patient: Gilbert Wilson MRN: [id number] DOB: 11-08-1945  ?Admit date:     01/01/2022  ?Discharge date: 01/04/22  ?Discharge Physician: Berle Mull  ?PCP: [facility] ? ?Recommendations at discharge: ?Follow-up with PCP in 1 week. ?Continue to engage with the family with regards to goals of care conversation. ?Follow-up with urology for suprapubic catheter. ?Follow-up with cardiology ?Recommend referral to vascular surgery for PVD. ? ?Discharge Diagnoses: ?Principal Problem: ?  Acute encephalopathy ?Active Problems: ?  Complicated UTI (urinary tract infection) ?  History of renal transplant ?  Liver transplant recipient [facility]) ?  Essential hypertension ?  Type 2 diabetes mellitus ([place]) ?  Seizure ([place]) ?  Benign prostatic hyperplasia with lower urinary tract symptoms ?  Dementia ([place]) ?  Atrial fibrillation ([place]) ?  Acute kidney injury superimposed on chronic kidney disease ([place]) ?  History of CVA with residual deficit ? ?Hospital Course: ? Gilbert Wilson is a 76 y.o. male with medical history significant of hypertension, atrial fibrillation s/p ablation, CVA with right-sided hemiplegia, DM type II, ESRD s/p renal transplant in 2016, chronic hepatitis C with cirrhosis, liver failure s/p liver transplant in 2016, dementia, seizure disorder, and BPH who presents for altered mental status over the last 2 days. ?Found to have a UTI. ?Also had an unresponsive episode in the hospital on 5/3. ? ?Assessment and Plan: ?Complicated urinary tract infection due to UTI. ?Ongoing issues since last Friday. ?Chronic indwelling Foley catheter due to BPH. ?Family noted discoloration and drainage issues. ?Catheter changed on Sunday. ?Continues to have cloudy urine therefore family sent a urine sample to [place]. ?Urine culture growing Proteus mirabilis this is essentially the same organism that was present in March based on sensitivity and thus I suspect this is mostly a colonizer. ?Continue  Omnicef for 4 more days.  At present no indication for long-term suppressive antibiotic.  Follow-up with urology for further discussion. ?  ?Acute metabolic encephalopathy. ?Unresponsive event. ?Patient presents with confusion and lethargy most likely secondary to combination of UTI as well as dehydration and acute kidney injury on chronic kidney disease. ?Hypotension also contributing. ?On 5/3 patient had an witnessed unresponsive event without any loss of control of bowel or bladder with gradual improvement in his mentation. ?CT of the head unremarkable.  EEG negative for any acute seizures.  Anticipating medication side effect as a potential etiology ?Recommended dose reduced to 7.5 mg.  Mentation remained stable. ?  ?Sinus bradycardia. ?Second-degree heart block ?Appreciate EP consultation. ?Patient is with history of dementia mostly bedbound. ?Agreeably not a good candidate for pacemaker implant especially in the setting of recurrent infections with UTI due to his indwelling Foley catheter. ?Recommend family to discuss goals of care in the setting of multiple comorbidities. ?  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  Baseline creatinine previously had been around 1.9.  Likely combination of dehydration from poor p.o. intake, UTI as well as urinary obstruction from malfunctioning Foley catheter and diarrhea. ?Renal function remaining stable. ?  ?Right lower extremity edema. ?Lower extremity Doppler ruled out DVT.  Swelling improving. ?  ?History of chronic hepatitis C ?History of cirrhosis ?History of liver failure ?Liver transplant on chronic immunosuppressive therapy ?LFTs appear to be stable at this time.  Home regimen includes mycophenolate 80 mg twice daily and cyclosporine 150 mg twice daily.  ?-Continue current medication regimen ? ?Hyponatremia ?Resolved.  From  poor p.o. intake. ?   ?Microcytic anemia ?Chronic.  Hemoglobin 11.9 g/dL which appears improved from last available.  Continue to monitor. ?  ?Essential hypertension ?Home medication regimen includes hydralazine 100 mg 3 times daily and losartan 50 mg twice daily. ?Holding losartan in the setting of AKI. ?Reducing hydralazine from 100mg  to 50 mg 3 times daily. ?Outpatient follow-up with PCP. ?  ?Metabolic acidosis ?CO2 16 without elevated anion gap.  Suspect secondary to chronic kidney disease. ?-Continue sodium bicarb ?  ?Diabetes mellitus type 2, well controlled with long-term insulin use with CKD. ?Last hemoglobin A1c 6.5 on 11/25/2021.  Home regimen includes sliding scale glargine. ?Continue on discharge. ?  ?History of CVA with residual deficit ?Patient with prior history of stroke in 2019 with residual right-sided hemiplegia.  He was noted to have suffered acute infarct of the right corona radiata by MRI on 3/26.  He is placed on dual antiplatelet therapy due to failed monotherapy ?-Continue Plavix, aspirin, and statin ?  ?History of seizure ?No seizure-like activity reported ?-Continue Keppra ?  ?Dementia ?-Delirium precautions ?  ?BPH, with lower urinary tract symptoms ?-Continue Foley catheter ?-Continue Proscar ?  ?Hyperlipidemia ?-Continue Crestor ? ?PVD. ?Right mid SFA 50 to 70% stenosis with adequate flow. ?Recommend outpatient follow-up with PCP and referral to vascular surgery. ? ?Goals of care conversation. ?Extensive discussion with the family with regards to goals of care. ?Patient has multiple comorbidities including CVA with residual deficit, dementia, history of seizures, liver transplant history, renal transplant history, ESRD, mostly bedridden with chronic indwelling Foley catheter with recurrent UTI and now appears to have second-degree heart block.  ?Chances of patient's arrival in a cardiac arrest is significantly low and neurological damage in such event is significantly high. ?Recommended family to discuss  with PCP as well as among themselves to identify what will be the quality of life for the patient and Dora Sims their goal of care. ?Currently my recommendation is that the patient should be DNR and DNI based on his comorbidities and high risk for poor outcomes in the event of cardiac arrest. ? ?Consultants: Cardiology  ?Procedures performed:  ?none ?DISCHARGE MEDICATION: ?Allergies as of 01/04/2022   ? ?   Reactions  ? Levaquin [levofloxacin] Itching  ? Linezolid   ? Seizures  ? Lisinopril Cough  ? ?  ? ?  ?Medication List  ?  ? ?STOP taking these medications   ? ?losartan 100 MG tablet ?Commonly known as: COZAAR ?  ? ?  ? ?TAKE these medications   ? ?acetaminophen 500 MG tablet ?Commonly known as: TYLENOL ?Take 1,000 mg by mouth every 6 (six) hours as needed for moderate pain, headache or fever. ?  ?aspirin 81 MG EC tablet ?Take 1 tablet (81 mg total) by mouth daily. Swallow whole. ?  ?calcitRIOL 0.25 MCG capsule ?Commonly known as: ROCALTROL ?Take 0.25 mcg by mouth daily. ?  ?cefdinir 300 MG capsule ?Commonly known as: OMNICEF ?Take 1 capsule (300 mg total) by mouth daily for 4 days. ?  ?clopidogrel 75 MG tablet ?Commonly known as: PLAVIX ?Take 75 mg by mouth daily. ?  ?cycloSPORINE modified 100 MG capsule ?Commonly known as: NEORAL ?Take 150 mg by mouth 2 (two) times daily. ?  ?finasteride 5 MG tablet ?Commonly known as: PROSCAR ?Take 5 mg by mouth every evening. ?  ?hydrALAZINE 50 MG tablet ?Commonly known as: APRESOLINE ?Take 1 tablet (50 mg total) by mouth 3 (three) times daily. ?What changed: how much to take ?  ?IRON-150  PO ?Take 1 tablet by mouth daily. ?  ?Lantus SoloStar 100 UNIT/ML Solostar Pen ?Generic drug: insulin glargine ?Inject 0-6 Units into the skin at bedtime. If BS>130-Take 6 units, Increase 1 unit for every 10 mg/dl above 130. ?  ?levETIRAcetam 500 MG tablet ?Commonly known as: KEPPRA ?Take 500 mg by mouth 2 (two) times daily. ?  ?MAGNESIUM OXIDE 400 PO ?Take 400 mg by mouth daily. ?   ?mirtazapine 15 MG tablet ?Commonly known as: REMERON ?Take 0.5 tablets (7.5 mg total) by mouth at bedtime. ?What changed: how much to take ?  ?mycophenolate 180 MG EC tablet ?Commonly known as: MYFORTIC ?Take 180 mg by m

## 2022-01-05 LAB — RENAL FUNCTION PANEL
Albumin: 2.6 g/dL — ABNORMAL LOW (ref 3.5–5.0)
Anion gap: 5 (ref 5–15)
BUN: 28 mg/dL — ABNORMAL HIGH (ref 8–23)
CO2: 21 mmol/L — ABNORMAL LOW (ref 22–32)
Calcium: 9.3 mg/dL (ref 8.9–10.3)
Chloride: 114 mmol/L — ABNORMAL HIGH (ref 98–111)
Creatinine, Ser: 1.65 mg/dL — ABNORMAL HIGH (ref 0.61–1.24)
GFR, Estimated: 43 mL/min — ABNORMAL LOW (ref >60)
Glucose, Bld: 134 mg/dL — ABNORMAL HIGH (ref 70–99)
Phosphorus: 3 mg/dL (ref 2.5–4.6)
Potassium: 3.6 mmol/L (ref 3.5–5.1)
Sodium: 140 mmol/L (ref 135–145)

## 2022-01-05 LAB — MAGNESIUM: Magnesium: 1.6 mg/dL — ABNORMAL LOW (ref 1.7–2.4)

## 2022-01-06 LAB — CULTURE, BLOOD (ROUTINE X 2): Culture: NO GROWTH

## 2022-01-10 ENCOUNTER — Other Ambulatory Visit: Payer: Medicare Other

## 2022-01-10 DIAGNOSIS — Z515 Encounter for palliative care: Secondary | ICD-10-CM

## 2022-01-10 NOTE — Progress Notes (Signed)
COMMUNITY PALLIATIVE CARE SW NOTE ? ?PATIENT NAME: Gilbert Wilson ?DOB: 03/14/1946 ?MRN: [id number] ? ?PRIMARY CARE PROVIDER: [facility] ? ?RESPONSIBLE PARTY:  ?Acct ID - Guarantor Home Phone Work Phone Relationship Acct Type  ?[id number] Gilbert Wilson* [phone]  Self P/F  ?   [address] [place], [place] [id number]  ? ? ?Palliative care SW outreached patient to complete telephonic visit.  ?  ?Palliative care SW outreached patient to complete telephonic visit. Patient's spouse, Jana Half, provided update on medical condition and/or changes.  ? ?Spouse endorses that patient is doing well. Continues to be majority bed bound with MOD-MAX A with all ADL needs. Patient is able to transfer to [facility] with hoyer lift. ?  ?Hospitalizations: Patient had recent hospital stay (5/3-5/7) due to UTI. Spouse shares that patient has is back to baseline since hospitalization. ?  ?Appetite: patients appetite remains good. No significant weight changes noted. ?  ?PCP: Patient is being followed by [facility]. No medication needs or adjustments noted. Patient is compliant with medications.  ? ?Home health: Patient is receiving [place] [facility]OT[facility]RN [facility]. Patient has hoyer lift, hospital bed and WC. ?  ?Psychosocial assessment: No financial, food insecurities or issues with transportation noted. No other psychosocial needs. No S/S of depression or anxiety noted. Spouse has aid and attendance services with [facility] information and is aware that she can utilize the service when ready. ?  ?Patient/Spouse has declined the need of PC in home visit at this time. Patient/spouse is open to [facility] continuing to follow up via telephone.  ?  ?Palliative care will continue to monitor and assist with long term care planning as needed and follow up via telephone in 1-2 months, unless otherwise noted. ? ?   ? ?SOCIAL HX:  ?Social History  ? ?Tobacco Use  ? Smoking status: Former  ?  Types: Cigarettes  ?  Quit date: 09/02/1983  ?  Years since quitting:  38.3  ? Smokeless tobacco: Never  ?Substance Use Topics  ? Alcohol use: Never  ? ? ? ? ? ? ? ?Doreene Eland, LCSW ? ?

## 2022-02-28 ENCOUNTER — Other Ambulatory Visit (HOSPITAL_COMMUNITY): Payer: Self-pay | Admitting: Urology

## 2022-02-28 ENCOUNTER — Other Ambulatory Visit: Payer: Self-pay | Admitting: Urology

## 2022-02-28 DIAGNOSIS — Z125 Encounter for screening for malignant neoplasm of prostate: Secondary | ICD-10-CM

## 2022-02-28 DIAGNOSIS — R972 Elevated prostate specific antigen [PSA]: Secondary | ICD-10-CM

## 2022-03-10 ENCOUNTER — Other Ambulatory Visit (HOSPITAL_COMMUNITY): Payer: Medicare Other

## 2022-03-19 ENCOUNTER — Telehealth: Payer: Self-pay

## 2022-03-19 NOTE — Telephone Encounter (Signed)
2 PM: Palliative care SW outreached patient/family for monthly telephonic visit and to schedule in person visit.  Call unsuccessful. SW left HIPPA compliant VM.  Awaiting return call. Will outreach again at later date and continue to offer palliative care support

## 2022-03-31 ENCOUNTER — Ambulatory Visit (HOSPITAL_COMMUNITY)
Admission: RE | Admit: 2022-03-31 | Discharge: 2022-03-31 | Disposition: A | Discharge status: Home / Self Care | Payer: No Typology Code available for payment source | Source: Ambulatory Visit | Attending: Urology | Admitting: Urology

## 2022-03-31 DIAGNOSIS — Z125 Encounter for screening for malignant neoplasm of prostate: Secondary | ICD-10-CM | POA: Diagnosis present

## 2022-03-31 DIAGNOSIS — R972 Elevated prostate specific antigen [PSA]: Secondary | ICD-10-CM | POA: Diagnosis present

## 2022-03-31 MED ORDER — GADOBUTROL 1 MMOL/ML IV SOLN
9.0000 mL | Freq: Once | INTRAVENOUS | Status: AC | PRN
  Administered 2022-03-31: 9 mL via INTRAVENOUS

## 2022-08-19 ENCOUNTER — Encounter (HOSPITAL_COMMUNITY): Payer: Self-pay

## 2022-08-19 ENCOUNTER — Emergency Department (HOSPITAL_COMMUNITY)
Admission: EM | Admit: 2022-08-19 | Discharge: 2022-08-19 | Disposition: A | Discharge status: Home / Self Care | Payer: No Typology Code available for payment source | Attending: Emergency Medicine | Admitting: Emergency Medicine

## 2022-08-19 DIAGNOSIS — Z7902 Long term (current) use of antithrombotics/antiplatelets: Secondary | ICD-10-CM | POA: Diagnosis not present

## 2022-08-19 DIAGNOSIS — Z7982 Long term (current) use of aspirin: Secondary | ICD-10-CM | POA: Insufficient documentation

## 2022-08-19 DIAGNOSIS — R34 Anuria and oliguria: Secondary | ICD-10-CM | POA: Diagnosis not present

## 2022-08-19 DIAGNOSIS — R944 Abnormal results of kidney function studies: Secondary | ICD-10-CM | POA: Diagnosis not present

## 2022-08-19 DIAGNOSIS — Z794 Long term (current) use of insulin: Secondary | ICD-10-CM | POA: Insufficient documentation

## 2022-08-19 DIAGNOSIS — N390 Urinary tract infection, site not specified: Secondary | ICD-10-CM

## 2022-08-19 DIAGNOSIS — D649 Anemia, unspecified: Secondary | ICD-10-CM | POA: Diagnosis not present

## 2022-08-19 LAB — URINALYSIS, ROUTINE W REFLEX MICROSCOPIC
Bilirubin Urine: NEGATIVE
Glucose, UA: NEGATIVE mg/dL
Ketones, ur: NEGATIVE mg/dL
Nitrite: NEGATIVE
Protein, ur: 100 mg/dL — AB
Specific Gravity, Urine: 1.013 (ref 1.005–1.030)
WBC, UA: >50 WBC/hpf — ABNORMAL HIGH (ref 0–5)
pH: 8 (ref 5.0–8.0)

## 2022-08-19 LAB — CBC WITH DIFFERENTIAL/PLATELET
Abs Immature Granulocytes: 0.02 10*3/uL (ref 0.00–0.07)
Basophils Absolute: 0.1 10*3/uL (ref 0.0–0.1)
Basophils Relative: 1 %
Eosinophils Absolute: 0.3 10*3/uL (ref 0.0–0.5)
Eosinophils Relative: 5 %
HCT: 33.4 % — ABNORMAL LOW (ref 39.0–52.0)
Hemoglobin: 10 g/dL — ABNORMAL LOW (ref 13.0–17.0)
Immature Granulocytes: 0 %
Lymphocytes Relative: 24 %
Lymphs Abs: 1.2 10*3/uL (ref 0.7–4.0)
MCH: 23.6 pg — ABNORMAL LOW (ref 26.0–34.0)
MCHC: 29.9 g/dL — ABNORMAL LOW (ref 30.0–36.0)
MCV: 78.8 fL — ABNORMAL LOW (ref 80.0–100.0)
Monocytes Absolute: 0.6 10*3/uL (ref 0.1–1.0)
Monocytes Relative: 12 %
Neutro Abs: 3 10*3/uL (ref 1.7–7.7)
Neutrophils Relative %: 58 %
Platelets: 218 10*3/uL (ref 150–400)
RBC: 4.24 MIL/uL (ref 4.22–5.81)
RDW: 15.4 % (ref 11.5–15.5)
WBC: 5.2 10*3/uL (ref 4.0–10.5)
nRBC: 0 % (ref 0.0–0.2)

## 2022-08-19 LAB — COMPREHENSIVE METABOLIC PANEL
ALT: 6 U/L (ref 0–44)
AST: 10 U/L — ABNORMAL LOW (ref 15–41)
Albumin: 3.1 g/dL — ABNORMAL LOW (ref 3.5–5.0)
Alkaline Phosphatase: 67 U/L (ref 38–126)
Anion gap: 5 (ref 5–15)
BUN: 25 mg/dL — ABNORMAL HIGH (ref 8–23)
CO2: 24 mmol/L (ref 22–32)
Calcium: 9.4 mg/dL (ref 8.9–10.3)
Chloride: 111 mmol/L (ref 98–111)
Creatinine, Ser: 1.78 mg/dL — ABNORMAL HIGH (ref 0.61–1.24)
GFR, Estimated: 39 mL/min — ABNORMAL LOW (ref >60)
Glucose, Bld: 166 mg/dL — ABNORMAL HIGH (ref 70–99)
Potassium: 4.2 mmol/L (ref 3.5–5.1)
Sodium: 140 mmol/L (ref 135–145)
Total Bilirubin: 0.7 mg/dL (ref 0.3–1.2)
Total Protein: 6.5 g/dL (ref 6.5–8.1)

## 2022-08-19 MED ORDER — SODIUM CHLORIDE 0.9 % IV BOLUS: 500.0000 mL | Freq: Once | INTRAVENOUS | Status: DC

## 2022-08-19 MED ORDER — CEPHALEXIN 500 MG PO CAPS: 500.0000 mg | ORAL_CAPSULE | Freq: Three times a day (TID) | ORAL | 0 refills | Status: DC

## 2022-08-19 NOTE — ED Triage Notes (Signed)
Patient arrived to the ED via EMS from home. Patient is nonverbal and family at bedside. Patient family states that the patient always has a catheter in. Family states that home health nurse inserted a new foley yesterday and the family noted a lower urine output than normal. Family states that he normally has an output of 1500-1800 mL and had only had an output of 1000 mL. Family states that they brought this up to the home health nurse so they came and replaced the foley this morning. Family states patient has had an output of 100 mL since 0830 this morning. Patient is nonverbal at baseline. Patient has history of stroke and dementia.

## 2022-08-19 NOTE — ED Provider Notes (Signed)
Note from Dr. Gustavus Messing.  76 year old male with low urine output.  Just had his catheter changed yesterday.  Denies any complaints.  Plan is to follow-up on results of lab work and urinalysis.  Likely can be discharged back to his facility. Physical Exam  BP (!) 170/61   Pulse 64   Temp 98.1 F (36.7 C) (Oral)   Resp 15   Ht 6\' 1"  (1.854 m) Comment: Family stated  Wt 90.7 kg Comment: Family stated  SpO2 97%   BMI 26.39 kg/m   Physical Exam  Procedures  Procedures  ED Course / MDM    Medical Decision Making Amount and/or Complexity of Data Reviewed Labs: ordered.  Risk Prescription drug management.   Labs showing elevated but stable creatinine.  Low stable hemoglobin.  Awaiting urinalysis and will orally rehydrate. UA with possible infection. Will cover with keflex. Reviewed with family and ambulance called for transport home.       Hayden Rasmussen, MD 08/20/22 [phone]

## 2022-08-19 NOTE — Discharge Instructions (Addendum)
You are seen in the emergency department for low urine output.  Your kidney function was stable, continue to encourage fluids.  Your urine showed possible signs of infection and we are starting you on an antibiotic.  Please follow-up with your primary care doctor and urology team.  Return to the emergency department if any worsening or concerning symptoms

## 2022-08-19 NOTE — ED Notes (Signed)
Bladder scanner reading 1 mL.

## 2022-08-19 NOTE — ED Provider Notes (Signed)
[facility] EMERGENCY DEPARTMENT Provider Note   CSN: [id number] Arrival date & time: 08/19/22  1226     History  Chief Complaint  Patient presents with   Low Urine Output    Gilbert Wilson is a 76 y.o. male.  The history is provided by the patient, medical records and a relative. No language interpreter was used.  Dysuria Presenting symptoms: no dysuria, no penile discharge and no penile pain   Ineffective treatments:  None tried Associated symptoms: no abdominal pain, no diarrhea, no fever, no flank pain, no nausea, no penile swelling, no scrotal swelling, no urinary frequency, no urinary hesitation, no urinary incontinence, no urinary retention and no vomiting   Associated symptoms comment:  Urine amount decreased per family Risk factors: urinary catheter        Home Medications Prior to Admission medications   Medication Sig Start Date End Date Taking? Authorizing Provider  acetaminophen (TYLENOL) 500 MG tablet Take 1,000 mg by mouth every 6 (six) hours as needed for moderate pain, headache or fever.    [provider]  aspirin EC 81 MG EC tablet Take 1 tablet (81 mg total) by mouth daily. Swallow whole. 11/27/21 11/22/22  Barb Merino, MD  calcitRIOL (ROCALTROL) 0.25 MCG capsule Take 0.25 mcg by mouth daily.    [provider]  clopidogrel (PLAVIX) 75 MG tablet Take 75 mg by mouth daily.    [provider]  cycloSPORINE modified (NEORAL) 100 MG capsule Take 150 mg by mouth 2 (two) times daily.    [provider]  Fe Bisgly-Succ-C-Thre-B12-FA (IRON-150 PO) Take 1 tablet by mouth daily.    [provider]  finasteride (PROSCAR) 5 MG tablet Take 5 mg by mouth every evening.    [provider]  hydrALAZINE (APRESOLINE) 50 MG tablet Take 1 tablet (50 mg total) by mouth 3 (three) times daily. 01/04/22   Lavina Hamman, MD  insulin glargine (LANTUS SOLOSTAR) 100 UNIT/ML Solostar Pen Inject 0-6 Units into the  skin at bedtime. If BS>130-Take 6 units, Increase 1 unit for every 10 mg/dl above 130.    [provider]  levETIRAcetam (KEPPRA) 500 MG tablet Take 500 mg by mouth 2 (two) times daily.    [provider]  MAGNESIUM OXIDE 400 PO Take 400 mg by mouth daily.    [provider]  mirtazapine (REMERON) 15 MG tablet Take 0.5 tablets (7.5 mg total) by mouth at bedtime. 01/04/22   Lavina Hamman, MD  mycophenolate (MYFORTIC) 180 MG EC tablet Take 180 mg by mouth 2 (two) times daily.    [provider]  rosuvastatin (CRESTOR) 10 MG tablet Take 10 mg by mouth every evening.    [provider]  sodium bicarbonate 650 MG tablet Take 1,300 mg by mouth 2 (two) times daily.    [provider]      Allergies    Levaquin [levofloxacin], Linezolid, and Lisinopril    Review of Systems   Review of Systems  Constitutional:  Negative for chills, fatigue and fever.  HENT:  Negative for congestion.   Respiratory:  Negative for cough, chest tightness and stridor.   Gastrointestinal:  Negative for abdominal pain, constipation, diarrhea, nausea and vomiting.  Genitourinary:  Positive for decreased urine volume. Negative for bladder incontinence, dysuria, flank pain, frequency, hesitancy, penile discharge, penile pain, penile swelling, scrotal swelling, testicular pain and urgency.  Musculoskeletal:  Negative for back pain and neck pain.  Neurological:  Negative for dizziness, light-headedness  and headaches.  Psychiatric/Behavioral:  Negative for agitation.   All other systems reviewed and are negative.   Physical Exam Updated Vital Signs BP (!) 170/61   Pulse 64   Temp 98.1 F (36.7 C) (Oral)   Resp 15   Ht 6\' 1"  (1.854 m) Comment: Family stated  Wt 90.7 kg Comment: Family stated  SpO2 97%   BMI 26.39 kg/m  Physical Exam Vitals and nursing note reviewed.  Constitutional:      General: He is not in acute distress.    Appearance: He is well-developed.  He is not ill-appearing, toxic-appearing or diaphoretic.  HENT:     Head: Normocephalic and atraumatic.     Nose: Nose normal.     Mouth/Throat:     Mouth: Mucous membranes are moist.  Eyes:     Conjunctiva/sclera: Conjunctivae normal.  Cardiovascular:     Rate and Rhythm: Normal rate and regular rhythm.     Heart sounds: No murmur heard. Pulmonary:     Effort: Pulmonary effort is normal. No respiratory distress.     Breath sounds: Normal breath sounds. No wheezing, rhonchi or rales.  Chest:     Chest wall: No tenderness.  Abdominal:     General: Abdomen is flat. There is no distension.     Palpations: Abdomen is soft.     Tenderness: There is no abdominal tenderness. There is no guarding or rebound.  Genitourinary:    Comments: Deferred by patient and family in the hallway but patient has no groin complaints with normal appearing functioning foley bag Musculoskeletal:        General: No swelling or tenderness.     Cervical back: Neck supple.  Skin:    General: Skin is warm and dry.     Capillary Refill: Capillary refill takes less than 2 seconds.     Findings: No erythema or rash.  Neurological:     Mental Status: He is alert. Mental status is at baseline.  Psychiatric:        Mood and Affect: Mood normal.     ED Results / Procedures / Treatments   Labs (all labs ordered are listed, but only abnormal results are displayed) Labs Reviewed  CBC WITH DIFFERENTIAL/PLATELET - Abnormal; Notable for the following components:      Result Value   Hemoglobin 10.0 (*)    HCT 33.4 (*)    MCV 78.8 (*)    MCH 23.6 (*)    MCHC 29.9 (*)    All other components within normal limits  URINE CULTURE  COMPREHENSIVE METABOLIC PANEL  URINALYSIS, ROUTINE W REFLEX MICROSCOPIC    EKG None  Radiology No results found.  Procedures Procedures    Medications Ordered in ED Medications - No data to display  ED Course/ Medical Decision Making/ A&P                           Medical  Decision Making Amount and/or Complexity of Data Reviewed Labs: ordered.    Gilbert Wilson is a 76 y.o. male with a past medical history significant for previous kidney and liver transplant, dementia, previous stroke, and previous UTIs who presents with decreased urination.  According to family, he had his Foley catheter changed yesterday and he typically has about 1800 cc of urine out daily.  Since exchange over the last 24 hours he is only had 1000 cc of urine out and they are concerned it is less than normal.  Patient has not any abdominal pain, nausea, vomiting, fevers, chills, or other complaints.  He recently had treatment for UTI although they report no different change in appearance, smell, or sediment in the new Foley catheter.  Patient otherwise is feeling at his baseline.  On exam, abdomen nontender.  Normal bowel sounds.  Patient resting comfortably at mental status baseline.  He is denying any groin pain or testicle pain and I see urine coming from his Foley catheter without abnormality in it.  Clinically we discussed that decreases in urine output can be due to obstruction as she has had the past, infection, or decreased urinary output from a renal standpoint.  Patient's family is concerned and they agreed to getting urinalysis, screening labs, and reassessment.  A bladder scan was performed and there was 1 cc of urine so I do suspect that the catheter is working normally.  Family reports they have flushed it and urine is coming out normally now.  Do not feel there is an obstructive cause of decreased urine amount.  He will get screening labs and a urinalysis.  As the catheter was changed yesterday, will not replace it at this time and we will send off a urinalysis and culture.  Patient will get the metabolic panels with her kidney function and urinalysis.  If workup is reassuring, we agreed the patient may just be slightly dehydrated and will need to increase oral hydration at home.  If  there is evidence of significant kidney injury or other abnormality would reassess to determine further management.  Care transferred to oncoming team to wait for results of metabolic panel and urinalysis.  Care transferred in stable condition.         Final Clinical Impression(s) / ED Diagnoses Final diagnoses:  Decreased urine volume    Clinical Impression: 1. Decreased urine volume     Disposition: Care transferred to punctum team to await urinalysis and metabolic panel.  Dissipate discharge if workup is reassuring for PCP follow-up.  This note was prepared with assistance of Systems analyst. Occasional wrong-word or sound-a-like substitutions may have occurred due to the inherent limitations of voice recognition software.     Teirra Carapia, Gwenyth Allegra, MD 08/19/22 1540

## 2023-04-29 ENCOUNTER — Emergency Department (HOSPITAL_COMMUNITY): Payer: No Typology Code available for payment source

## 2023-04-29 ENCOUNTER — Other Ambulatory Visit: Payer: Self-pay

## 2023-04-29 ENCOUNTER — Encounter (HOSPITAL_COMMUNITY): Payer: Self-pay | Admitting: Emergency Medicine

## 2023-04-29 ENCOUNTER — Emergency Department (HOSPITAL_COMMUNITY)
Admission: EM | Admit: 2023-04-29 | Discharge: 2023-04-29 | Disposition: A | Discharge status: Home / Self Care | Payer: No Typology Code available for payment source | Attending: Emergency Medicine | Admitting: Emergency Medicine

## 2023-04-29 DIAGNOSIS — R4182 Altered mental status, unspecified: Secondary | ICD-10-CM | POA: Diagnosis present

## 2023-04-29 DIAGNOSIS — Z7902 Long term (current) use of antithrombotics/antiplatelets: Secondary | ICD-10-CM | POA: Diagnosis not present

## 2023-04-29 DIAGNOSIS — Z794 Long term (current) use of insulin: Secondary | ICD-10-CM | POA: Insufficient documentation

## 2023-04-29 DIAGNOSIS — Z8673 Personal history of transient ischemic attack (TIA), and cerebral infarction without residual deficits: Secondary | ICD-10-CM | POA: Diagnosis not present

## 2023-04-29 DIAGNOSIS — N186 End stage renal disease: Secondary | ICD-10-CM | POA: Diagnosis not present

## 2023-04-29 DIAGNOSIS — R404 Transient alteration of awareness: Secondary | ICD-10-CM | POA: Insufficient documentation

## 2023-04-29 LAB — PROTIME-INR
INR: 1.2 (ref 0.8–1.2)
Prothrombin Time: 15.8 s — ABNORMAL HIGH (ref 11.4–15.2)

## 2023-04-29 LAB — DIFFERENTIAL
Abs Immature Granulocytes: 0.03 K/uL (ref 0.00–0.07)
Basophils Absolute: 0.1 K/uL (ref 0.0–0.1)
Basophils Relative: 1 %
Eosinophils Absolute: 0.2 K/uL (ref 0.0–0.5)
Eosinophils Relative: 3 %
Immature Granulocytes: 1 %
Lymphocytes Relative: 16 %
Lymphs Abs: 0.9 K/uL (ref 0.7–4.0)
Monocytes Absolute: 0.5 K/uL (ref 0.1–1.0)
Monocytes Relative: 8 %
Neutro Abs: 4.2 K/uL (ref 1.7–7.7)
Neutrophils Relative %: 71 %

## 2023-04-29 LAB — APTT: aPTT: 32 s (ref 24–36)

## 2023-04-29 LAB — COMPREHENSIVE METABOLIC PANEL WITH GFR
ALT: 9 U/L (ref 0–44)
AST: 14 U/L — ABNORMAL LOW (ref 15–41)
Albumin: 3.2 g/dL — ABNORMAL LOW (ref 3.5–5.0)
Alkaline Phosphatase: 65 U/L (ref 38–126)
Anion gap: 15 (ref 5–15)
BUN: 48 mg/dL — ABNORMAL HIGH (ref 8–23)
CO2: 18 mmol/L — ABNORMAL LOW (ref 22–32)
Calcium: 9.6 mg/dL (ref 8.9–10.3)
Chloride: 108 mmol/L (ref 98–111)
Creatinine, Ser: 2.44 mg/dL — ABNORMAL HIGH (ref 0.61–1.24)
GFR, Estimated: 27 mL/min — ABNORMAL LOW
Glucose, Bld: 130 mg/dL — ABNORMAL HIGH (ref 70–99)
Potassium: 4.2 mmol/L (ref 3.5–5.1)
Sodium: 141 mmol/L (ref 135–145)
Total Bilirubin: 0.9 mg/dL (ref 0.3–1.2)
Total Protein: 6.6 g/dL (ref 6.5–8.1)

## 2023-04-29 LAB — CBC
HCT: 27.4 % — ABNORMAL LOW (ref 39.0–52.0)
Hemoglobin: 8.4 g/dL — ABNORMAL LOW (ref 13.0–17.0)
MCH: 23.9 pg — ABNORMAL LOW (ref 26.0–34.0)
MCHC: 30.7 g/dL (ref 30.0–36.0)
MCV: 78.1 fL — ABNORMAL LOW (ref 80.0–100.0)
Platelets: 212 10*3/uL (ref 150–400)
RBC: 3.51 MIL/uL — ABNORMAL LOW (ref 4.22–5.81)
RDW: 16 % — ABNORMAL HIGH (ref 11.5–15.5)
WBC: 5.9 10*3/uL (ref 4.0–10.5)
nRBC: 0 % (ref 0.0–0.2)

## 2023-04-29 LAB — ETHANOL: Alcohol, Ethyl (B): <10 mg/dL (ref <10)

## 2023-04-29 LAB — CBG MONITORING, ED: Glucose-Capillary: 118 mg/dL — ABNORMAL HIGH (ref 70–99)

## 2023-04-29 MED ORDER — SODIUM CHLORIDE 0.9 % IV BOLUS
500.0000 mL | Freq: Once | INTRAVENOUS | Status: AC
  Administered 2023-04-29: 500 mL via INTRAVENOUS

## 2023-04-29 MED ORDER — LEVETIRACETAM IN NACL 1000 MG/100ML IV SOLN
1000.0000 mg | Freq: Two times a day (BID) | INTRAVENOUS | Status: DC
  Administered 2023-04-29: 1000 mg via INTRAVENOUS
  Filled 2023-04-29: qty 100

## 2023-04-29 MED ORDER — SODIUM CHLORIDE 0.9 % IV SOLN
100.0000 mL/h | INTRAVENOUS | Status: DC
  Administered 2023-04-29: 100 mL/h via INTRAVENOUS

## 2023-04-29 MED ORDER — LEVETIRACETAM 750 MG PO TABS: 750.0000 mg | ORAL_TABLET | Freq: Two times a day (BID) | ORAL | 0 refills | Status: DC

## 2023-04-29 NOTE — Discharge Instructions (Signed)
As discussed, your evaluation today has been largely reassuring.  But, it is important that you monitor your condition carefully, and do not hesitate to return to the ED if you develop new, or concerning changes in your condition.  Today's episode was most likely a seizure.  Because of this your Keppra dose has been adjusted.  Please discuss this with your physician on follow-up.  Otherwise, please follow-up with your physician for appropriate ongoing care.

## 2023-04-29 NOTE — ED Triage Notes (Signed)
Pt BIB EMS from home for possible syncopal episode. EMS reports that family witnessed pt have a change in mentation for approx. 30 minutes. Initial SBP in the 60's reports that pts mentation improved when he was laid back. Hx of stroke with R sided deficits, nonverbal, and at baseline only answers yes/no questions. Hx of kidney and liver transplant.

## 2023-04-29 NOTE — ED Provider Notes (Signed)
[facility] Provider Note   CSN: [id number] Arrival date & time: 04/29/23  1512     History  Chief Complaint  Patient presents with   Altered Mental Status    Krishiv Roessler is a 77 y.o. male.  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  History is obtained by EMS, as the patient is generally aphasic, answers only with yes, no responses.  Per report the patient had a 30-minute period of decreased interactivity, listlessness.  Per report this has improved, the patient currently denies complaints, dyspnea, focal weakness, pain.  Level 5 caveat secondary to mental status.    Home Medications Prior to Admission medications   Medication Sig Start Date End Date Taking? Authorizing Provider  acetaminophen (TYLENOL) 500 MG tablet Take 1,000 mg by mouth every 6 (six) hours as needed for moderate pain, headache or fever.    [provider]  calcitRIOL (ROCALTROL) 0.25 MCG capsule Take 0.25 mcg by mouth daily.    [provider]  cephALEXin (KEFLEX) 500 MG capsule Take 1 capsule (500 mg total) by mouth 3 (three) times daily. 08/19/22   Terrilee Files, MD  clopidogrel (PLAVIX) 75 MG tablet Take 75 mg by mouth daily.    [provider]  cycloSPORINE modified (NEORAL) 100 MG capsule Take 150 mg by mouth 2 (two) times daily.    [provider]  Fe Bisgly-Succ-C-Thre-B12-FA (IRON-150 PO) Take 1 tablet by mouth daily.    [provider]  finasteride (PROSCAR) 5 MG tablet Take 5 mg by mouth every evening.    [provider]  hydrALAZINE (APRESOLINE) 50 MG tablet Take 1 tablet (50 mg total) by mouth 3 (three) times daily. 01/04/22   Rolly Salter, MD  insulin glargine (LANTUS SOLOSTAR) 100 UNIT/ML Solostar Pen Inject 0-6 Units into the skin at bedtime. If BS>130-Take 6 units, Increase 1 unit for every 10  mg/dl above 409.    [provider]  levETIRAcetam (KEPPRA) 750 MG tablet Take 1 tablet (750 mg total) by mouth 2 (two) times daily. 04/29/23   Gerhard Munch, MD  MAGNESIUM OXIDE 400 PO Take 400 mg by mouth daily.    [provider]  mirtazapine (REMERON) 15 MG tablet Take 0.5 tablets (7.5 mg total) by mouth at bedtime. 01/04/22   Rolly Salter, MD  mycophenolate (MYFORTIC) 180 MG EC tablet Take 180 mg by mouth 2 (two) times daily.    [provider]  rosuvastatin (CRESTOR) 10 MG tablet Take 10 mg by mouth every evening.    [provider]  sodium bicarbonate 650 MG tablet Take 1,300 mg by mouth 2 (two) times daily.    [provider]      Allergies    Levaquin [levofloxacin], Linezolid, and Lisinopril    Review of Systems   Review of Systems  All other systems reviewed and are negative.   Physical Exam Updated Vital Signs BP (!) 150/68   Pulse 75   Temp 98.3 F (36.8 C) (Oral)   Resp (!) 22   Ht 6\' 1"  (1.854 m)   Wt 90.7 kg   SpO2 99%   BMI 26.38 kg/m  Physical Exam Vitals and nursing note reviewed.  Constitutional:      General: He is not in acute distress.    Appearance: He is well-developed. He is ill-appearing.     Comments: Chronically ill-appearing  elderly male in no distress  HENT:     Head: Normocephalic and atraumatic.  Eyes:     Conjunctiva/sclera: Conjunctivae normal.  Cardiovascular:     Rate and Rhythm: Normal rate and regular rhythm.  Pulmonary:     Effort: Pulmonary effort is normal. No respiratory distress.     Breath sounds: No stridor.  Abdominal:     General: There is no distension.  Musculoskeletal:       Arms:  Skin:    General: Skin is warm and dry.  Neurological:     Mental Status: He is alert and oriented to person, place, and time.     Comments: Face is symmetric, speech is rare, but clear when offered, essentially single word responses.  Diffuse atrophy, right-sided hemiplegia  Psychiatric:         Behavior: Behavior is slowed.     ED Results / Procedures / Treatments   Labs (all labs ordered are listed, but only abnormal results are displayed) Labs Reviewed  PROTIME-INR - Abnormal; Notable for the following components:      Result Value   Prothrombin Time 15.8 (*)    All other components within normal limits  CBC - Abnormal; Notable for the following components:   RBC 3.51 (*)    Hemoglobin 8.4 (*)    HCT 27.4 (*)    MCV 78.1 (*)    MCH 23.9 (*)    RDW 16.0 (*)    All other components within normal limits  COMPREHENSIVE METABOLIC PANEL - Abnormal; Notable for the following components:   CO2 18 (*)    Glucose, Bld 130 (*)    BUN 48 (*)    Creatinine, Ser 2.44 (*)    Albumin 3.2 (*)    AST 14 (*)    GFR, Estimated 27 (*)    All other components within normal limits  CBG MONITORING, ED - Abnormal; Notable for the following components:   Glucose-Capillary 118 (*)    All other components within normal limits  APTT  DIFFERENTIAL  ETHANOL  RAPID URINE DRUG SCREEN, HOSP PERFORMED    EKG EKG Interpretation Date/Time:  Wednesday April 29 2023 15:23:08 EDT Ventricular Rate:  70 PR Interval:  294 QRS Duration:  119 QT Interval:  413 QTC Calculation: 446 R Axis:   -48  Text Interpretation: Sinus or ectopic atrial rhythm Ventricular premature complex Prolonged PR interval Incomplete left bundle branch block Left ventricular hypertrophy Confirmed by Gerhard Munch [phone]) on 04/29/2023 4:56:04 PM  Radiology CT HEAD WO CONTRAST  Result Date: 04/29/2023 CLINICAL DATA:  Altered level of consciousness EXAM: CT HEAD WITHOUT CONTRAST TECHNIQUE: Contiguous axial images were obtained from the base of the skull through the vertex without intravenous contrast. RADIATION DOSE REDUCTION: This exam was performed according to the departmental dose-optimization program which includes automated exposure control, adjustment of the mA and/or kV according to patient size and/or use  of iterative reconstruction technique. COMPARISON:  01/02/2022 FINDINGS: Brain: Evaluation is limited by patient motion. Confluent hypodensities throughout the periventricular and subcortical white matter are again identified, consistent with chronic small vessel ischemic changes. No evidence of acute infarct or hemorrhage. Lateral ventricles and midline structures are stable. No acute extra-axial fluid collections. No mass effect. Vascular: No hyperdense vessel or unexpected calcification. Stable atherosclerosis. Skull: Normal. Negative for fracture or focal lesion. Sinuses/Orbits: No acute finding. Other: None. IMPRESSION: 1. No acute intracranial process. 2. Stable chronic small-vessel ischemic changes as above. Electronically Signed   By: Sharlet Salina  M.D.   On: 04/29/2023 16:55    Procedures Procedures    Medications Ordered in ED Medications  sodium chloride 0.9 % bolus 500 mL (0 mLs Intravenous Stopped 04/29/23 1801)    Followed by  0.9 %  sodium chloride infusion (100 mL/hr Intravenous New Bag/Given 04/29/23 1542)  levETIRAcetam (KEPPRA) IVPB 1000 mg/100 mL premix (0 mg Intravenous Stopped 04/29/23 1956)    ED Course/ Medical Decision Making/ A&P                                 Medical Decision Making Elderly male with substantial medical issues including end-stage renal disease, prior transplant kidney, liver now presents after episode of change in mentation approximately 30 minutes.  Differential includes syncope, TIA, electrode abnormalities, dehydration. Cardiac 75 sinus normal Pulse ox 99% room air normal   Amount and/or Complexity of Data Reviewed Independent Historian: EMS External Data Reviewed: notes. Labs: ordered. Decision-making details documented in ED Course. Radiology: ordered and independent interpretation performed. Decision-making details documented in ED Course. ECG/medicine tests: ordered and independent interpretation performed. Decision-making details  documented in ED Course.  Risk Prescription drug management. Decision regarding hospitalization. Diagnosis or treatment significantly limited by social determinants of health.   7:58 PM Patient in no distress, awake, alert, speaking clearly.  I have discussed the patient's case with our neurology colleague, Dr. Amada Jupiter.  We reviewed the patient's labs, presentation, history.  With suspicion for seizure greater than TIA patient will have Keppra loading dose, will increase his home meds, and is appropriate for outpatient follow-up.  This was discussed at length with the patient's wife as well who is amenable to the plan.  Patient's labs and vitals imaging all otherwise reassuring.        Final Clinical Impression(s) / ED Diagnoses Final diagnoses:  Transient alteration of awareness    Rx / DC Orders ED Discharge Orders          Ordered    levETIRAcetam (KEPPRA) 750 MG tablet  2 times daily        04/29/23 1958              Gerhard Munch, MD 04/29/23 1958

## 2023-05-26 ENCOUNTER — Other Ambulatory Visit: Payer: Self-pay

## 2023-05-26 ENCOUNTER — Emergency Department (HOSPITAL_COMMUNITY)
Admission: EM | Admit: 2023-05-26 | Discharge: 2023-05-26 | Disposition: A | Discharge status: Home / Self Care | Payer: No Typology Code available for payment source | Attending: Emergency Medicine | Admitting: Emergency Medicine

## 2023-05-26 DIAGNOSIS — Z940 Kidney transplant status: Secondary | ICD-10-CM | POA: Diagnosis not present

## 2023-05-26 DIAGNOSIS — N390 Urinary tract infection, site not specified: Secondary | ICD-10-CM | POA: Diagnosis not present

## 2023-05-26 DIAGNOSIS — I10 Essential (primary) hypertension: Secondary | ICD-10-CM | POA: Diagnosis not present

## 2023-05-26 DIAGNOSIS — E119 Type 2 diabetes mellitus without complications: Secondary | ICD-10-CM | POA: Insufficient documentation

## 2023-05-26 DIAGNOSIS — R7989 Other specified abnormal findings of blood chemistry: Secondary | ICD-10-CM | POA: Diagnosis present

## 2023-05-26 DIAGNOSIS — F0390 Unspecified dementia without behavioral disturbance: Secondary | ICD-10-CM | POA: Diagnosis not present

## 2023-05-26 LAB — CBC WITH DIFFERENTIAL/PLATELET
Abs Immature Granulocytes: 0.04 10*3/uL (ref 0.00–0.07)
Basophils Absolute: 0.1 10*3/uL (ref 0.0–0.1)
Basophils Relative: 1 %
Eosinophils Absolute: 0.3 10*3/uL (ref 0.0–0.5)
Eosinophils Relative: 5 %
HCT: 29.9 % — ABNORMAL LOW (ref 39.0–52.0)
Hemoglobin: 9.1 g/dL — ABNORMAL LOW (ref 13.0–17.0)
Immature Granulocytes: 1 %
Lymphocytes Relative: 24 %
Lymphs Abs: 1.5 10*3/uL (ref 0.7–4.0)
MCH: 24 pg — ABNORMAL LOW (ref 26.0–34.0)
MCHC: 30.4 g/dL (ref 30.0–36.0)
MCV: 78.9 fL — ABNORMAL LOW (ref 80.0–100.0)
Monocytes Absolute: 0.6 10*3/uL (ref 0.1–1.0)
Monocytes Relative: 10 %
Neutro Abs: 3.7 10*3/uL (ref 1.7–7.7)
Neutrophils Relative %: 59 %
Platelets: 273 10*3/uL (ref 150–400)
RBC: 3.79 MIL/uL — ABNORMAL LOW (ref 4.22–5.81)
RDW: 15.6 % — ABNORMAL HIGH (ref 11.5–15.5)
WBC: 6.2 10*3/uL (ref 4.0–10.5)
nRBC: 0 % (ref 0.0–0.2)

## 2023-05-26 LAB — COMPREHENSIVE METABOLIC PANEL
ALT: 11 U/L (ref 0–44)
AST: 16 U/L (ref 15–41)
Albumin: 3.2 g/dL — ABNORMAL LOW (ref 3.5–5.0)
Alkaline Phosphatase: 63 U/L (ref 38–126)
Anion gap: 12 (ref 5–15)
BUN: 36 mg/dL — ABNORMAL HIGH (ref 8–23)
CO2: 21 mmol/L — ABNORMAL LOW (ref 22–32)
Calcium: 9.8 mg/dL (ref 8.9–10.3)
Chloride: 111 mmol/L (ref 98–111)
Creatinine, Ser: 2.44 mg/dL — ABNORMAL HIGH (ref 0.61–1.24)
GFR, Estimated: 27 mL/min — ABNORMAL LOW (ref >60)
Glucose, Bld: 163 mg/dL — ABNORMAL HIGH (ref 70–99)
Potassium: 4.2 mmol/L (ref 3.5–5.1)
Sodium: 144 mmol/L (ref 135–145)
Total Bilirubin: 1 mg/dL (ref 0.3–1.2)
Total Protein: 7.3 g/dL (ref 6.5–8.1)

## 2023-05-26 LAB — URINALYSIS, W/ REFLEX TO CULTURE (INFECTION SUSPECTED)
Bilirubin Urine: NEGATIVE
Glucose, UA: NEGATIVE mg/dL
Ketones, ur: NEGATIVE mg/dL
Nitrite: NEGATIVE
Protein, ur: 100 mg/dL — AB
Specific Gravity, Urine: 1.011 (ref 1.005–1.030)
WBC, UA: >50 WBC/hpf (ref 0–5)
pH: 7 (ref 5.0–8.0)

## 2023-05-26 LAB — LIPASE, BLOOD: Lipase: 20 U/L (ref 11–51)

## 2023-05-26 LAB — MAGNESIUM: Magnesium: 2.3 mg/dL (ref 1.7–2.4)

## 2023-05-26 MED ORDER — SODIUM CHLORIDE 0.9 % IV SOLN
1.0000 g | Freq: Once | INTRAVENOUS | Status: AC
  Administered 2023-05-26: 1 g via INTRAVENOUS
  Filled 2023-05-26: qty 10

## 2023-05-26 MED ORDER — CEPHALEXIN 250 MG PO CAPS: 250.0000 mg | ORAL_CAPSULE | Freq: Three times a day (TID) | ORAL | 0 refills | Status: AC

## 2023-05-26 MED ORDER — SODIUM CHLORIDE 0.9 % IV BOLUS
500.0000 mL | Freq: Once | INTRAVENOUS | Status: AC
  Administered 2023-05-26: 500 mL via INTRAVENOUS

## 2023-05-26 NOTE — ED Provider Notes (Addendum)
77 year old male with past medical history of renal transplant and recent urinary tract infection presenting to the emergency department today with concern for abnormal labs on outpatient testing.  The patient's creatinine was found to be elevated compared to baseline.  On recheck today it does appear similar to his most recent values in our system.  The patient given IV fluids and In-N-Out catheter relation will be performed to determine whether or not the patient should be discharged with antibiotics  Physical Exam  BP (!) 143/56   Pulse 78   Temp 98.1 F (36.7 C) (Oral)   Resp 20   Ht 6\' 1"  (1.854 m)   SpO2 99%   BMI 26.38 kg/m   Physical Exam Gen: NAD Eyes: PERRL, EOMI HEENT: no oropharyngeal swelling Neck: trachea midline Resp: No respiratory distress noted Neuro: No focal deficits Skin: no rashes Psyc: acting appropriately  Procedures  Procedures  ED Course / MDM    Medical Decision Making Amount and/or Complexity of Data Reviewed Labs: ordered.  Risk Prescription drug management.   The patient's urinalysis does show findings concerning for infection with many bacteria with positive leukocytes.  The patient is given Rocephin here.  I have reviewed his most recent urinalysis which does show Proteus that is susceptible to cephalosporins.  He will be sent home on Keflex.  In reviewing his renal function he is placed on 250 mg 3 times a day.  Urine culture is pending.  He is discharged with return precautions.  The patient and his family are in agreement with this plan.  The patient's wife did asked that I touch base with our nephrologist on-call given his history of transplant.  I did speak with the on-call provider at [facility] and we did go over the patient's creatinine today as well as his most recent creatinines back to May of this year.  Given the fluctuation of the creatinine I feel that the patient was stable for discharge to follow-up with her outpatient  provider.  I discussed this with the patient's family and they are in agreement with this plan.  He is discharged with return precautions.       Durwin Glaze, MD 05/26/23 1737    Durwin Glaze, MD 05/26/23 [phone]

## 2023-05-26 NOTE — ED Provider Notes (Signed)
Emergency Department Provider Note   I have reviewed the triage vital signs and the nursing notes.   HISTORY  Chief Complaint Abnormal Labs and Weakness   HPI Gilbert Wilson is a 77 y.o. male past history of of stroke, diabetes, hypertension, status post kidney transplant presents to the emergency department with family at bedside for concern for dehydration and abnormal labs.  Patient is followed at the [facility] and had lab work done in the office yesterday showing elevated creatinine and other electrolyte derangements.  The patient's wife states that for the past 1 to 2 weeks he has been more fatigued and sleepy than normal.  His temperature has hovered near a fever.  He completed antibiotics for a urinary tract infection and seemed to improve but once antibiotics were finished his symptoms came back.  He does require In-N-Out cath at home performed by his wife but does not have an indwelling Foley catheter.   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  ____________________________________________   PHYSICAL EXAM:  VITAL SIGNS: ED Triage Vitals  Encounter Vitals Group     BP 05/26/23 1327 (!) 122/58     Pulse Rate 05/26/23 1327 74     Resp 05/26/23 1327 16     Temp --      Temp src --      SpO2 05/26/23 1326 97 %     Weight --      Height 05/26/23 1327 6\' 1"  (1.854 m)   Constitutional: Alert.  Eyes: Conjunctivae are normal. PERRL. Head: Atraumatic. Nose: No congestion/rhinnorhea. Mouth/Throat: Mucous membranes are moist.   Neck: No stridor.   Cardiovascular: Normal rate, regular rhythm. Good  peripheral circulation. Grossly normal heart sounds.   Respiratory: Normal respiratory effort.  No retractions. Lungs CTAB. Gastrointestinal: Soft and nontender. No distention.  Musculoskeletal: No gross deformities of extremities. Neurologic:  Baseline right sided weakness.  Skin:  Skin is warm, dry and intact. No rash noted.  ____________________________________________   LABS (all labs ordered are listed, but only abnormal results are displayed)  Labs Reviewed  URINE CULTURE - Abnormal; Notable for the following components:      Result Value   Culture >=100,000 COLONIES/mL SERRATIA MARCESCENS (*)    Organism ID, Bacteria SERRATIA MARCESCENS (*)    All other components within normal limits  COMPREHENSIVE METABOLIC PANEL - Abnormal; Notable for the following components:   CO2 21 (*)    Glucose, Bld 163 (*)    BUN 36 (*)    Creatinine, Ser 2.44 (*)    Albumin 3.2 (*)    GFR, Estimated 27 (*)    All other components within normal limits  CBC WITH DIFFERENTIAL/PLATELET - Abnormal; Notable for the following components:   RBC 3.79 (*)    Hemoglobin 9.1 (*)    HCT 29.9 (*)    MCV 78.9 (*)    MCH 24.0 (*)    RDW 15.6 (*)    All other components within normal limits  URINALYSIS, W/ REFLEX TO CULTURE (INFECTION SUSPECTED) - Abnormal;  Notable for the following components:   Color, Urine AMBER (*)    APPearance TURBID (*)    Hgb urine dipstick SMALL (*)    Protein, ur 100 (*)    Leukocytes,Ua LARGE (*)    Bacteria, UA MANY (*)    All other components within normal limits  LIPASE, BLOOD  MAGNESIUM    ____________________________________________   PROCEDURES  Procedure(s) performed:   Procedures  None  ____________________________________________   INITIAL IMPRESSION / ASSESSMENT AND PLAN / ED COURSE  Pertinent labs & imaging results that were available during my care of the patient were reviewed by me and considered in my medical decision making (see chart for  details).   This patient is Presenting for Evaluation of weakness, which does require a range of treatment options, and is a complaint that involves a high risk of morbidity and mortality.  The Differential Diagnoses includes AKI, dehydration, electrolyte disturbance, UTI, etc.  Critical Interventions-    Medications  sodium chloride 0.9 % bolus 500 mL (0 mLs Intravenous Stopped 05/26/23 1751)  cefTRIAXone (ROCEPHIN) 1 g in sodium chloride 0.9 % 100 mL IVPB (0 g Intravenous Stopped 05/26/23 1821)    Reassessment after intervention:  patient feeling improved.    I did obtain Additional Historical Information from wife and daughter at bedside.   I decided to review pertinent External Data, and in summary unable to see labs in the [place] system at this time.   Clinical Laboratory Tests Ordered, included CBC without leukocytosis or severe anemia requiring PRBC. Creatinine at baseline from labs in late August. UA pending.   Cardiac Monitor Tracing which shows NSR.    Social Determinants of Health Risk patient is a non-smoker.   Medical Decision Making: Summary:  Patient presents emergency department for evaluation of increased fatigue/sleepiness and outpatient lab abnormality.  I am unsure exactly which labs are abnormal as these were performed at the [place]. plan for screening blood work here including In-N-Out cath for urine.  Plan to send culture.  Patient arrives afebrile with no SIRS vitals. Doubt sepsis.   Reevaluation with update and discussion with patient. Care transferred to the oncoming physician. Plan to follow up on UA and reassess.   Considered admission but workup is pending.   Patient's presentation is most consistent with acute presentation with potential threat to life or bodily function.   Disposition: pending  ____________________________________________  FINAL CLINICAL IMPRESSION(S) / ED DIAGNOSES  Final diagnoses:  Urinary tract infection without hematuria, site  unspecified     NEW OUTPATIENT MEDICATIONS STARTED DURING THIS VISIT:  Discharge Medication List as of 05/26/2023  7:57 PM     START taking these medications   Details  !! cephALEXin (KEFLEX) 250 MG capsule Take 1 capsule (250 mg total) by mouth 3 (three) times daily for 7 days., Starting Tue 05/26/2023, Until Tue 06/02/2023, Normal     !! - Potential duplicate medications found. Please discuss with provider.      Note:  This document was prepared using Dragon voice recognition software and may include unintentional dictation errors.  Alona Bene, MD, [facility] Emergency Medicine    Wilton Thrall, Arlyss Repress, MD 05/29/23 [phone]

## 2023-05-26 NOTE — Discharge Instructions (Signed)
Please take the cephalexin as prescribed.  Follow-up with your doctor for reevaluation.  If your culture grows something that does not respond to this medication you should receive a call from one of our pharmacist.  Return to the emergency department for worsening symptoms.

## 2023-05-26 NOTE — ED Triage Notes (Signed)
Pt BIB EMS from home, seen at [place] a few days ago and had routine labs done, electrolytes came back abnormal, only complaining of weakness. Hx of stroke with right sided deficits. Aox1 at baseline.

## 2023-05-27 NOTE — ED Notes (Signed)
Pt's family provided with AVS.  Education complete; all questions answered.  Pt leaving ED in stable condition at this time with [facility] transport service.

## 2023-05-27 NOTE — ED Notes (Signed)
Pt transferred to ED 43 while waiting for [facility] transfer home following discharge.  Family at bedside.

## 2023-05-28 LAB — URINE CULTURE: Culture: >=100000 — AB

## 2023-05-29 ENCOUNTER — Telehealth (HOSPITAL_BASED_OUTPATIENT_CLINIC_OR_DEPARTMENT_OTHER): Payer: Self-pay | Admitting: *Deleted

## 2023-05-29 NOTE — Progress Notes (Signed)
ED Antimicrobial Stewardship Positive Culture Follow Up   Gilbert Wilson is an 77 y.o. male who presented to [facility] on 05/26/2023 with a chief complaint of  Chief Complaint  Patient presents with   Abnormal Labs   Weakness    Recent Results (from the past 720 hour(s))  Urine Culture     Status: Abnormal   Collection Time: 05/26/23  3:59 PM   Specimen: Urine, Catheterized  Result Value Ref Range Status   Specimen Description URINE, CATHETERIZED  Final   Special Requests   Final    NONE Performed at [facility], [address] [place], [place] [address]    Culture >=100,000 COLONIES/mL SERRATIA MARCESCENS (A)  Final   Report Status 05/28/2023 FINAL  Final   Organism ID, Bacteria SERRATIA MARCESCENS (A)  Final      Susceptibility   Serratia marcescens - MIC*    CEFEPIME <=0.12 SENSITIVE Sensitive     CEFTRIAXONE 1 SENSITIVE Sensitive     CIPROFLOXACIN <=0.25 SENSITIVE Sensitive     GENTAMICIN <=1 SENSITIVE Sensitive     NITROFURANTOIN 128 RESISTANT Resistant     TRIMETH/SULFA <=20 SENSITIVE Sensitive     * >=100,000 COLONIES/mL SERRATIA MARCESCENS    [x]  Treated with cephalexin, organism resistant to prescribed antimicrobial []  Patient discharged originally without antimicrobial agent and treatment is now indicated  New antibiotic prescription: DC cephalexin, start cefdinir 300mg  PO BID x 7 days  ED Provider: Arthor Captain, PA-C   Farrin Shadle, Drake Leach 05/29/2023, 7:43 AM Clinical Pharmacist Monday - Friday phone -  [phone] Saturday - Sunday phone - [phone]

## 2023-05-29 NOTE — Telephone Encounter (Signed)
Post ED Visit - Positive Culture Follow-up: Successful Patient Follow-Up  Culture assessed and recommendations reviewed by:  []  Enzo Bi, Pharm.D. []  Celedonio Miyamoto, Pharm.D., BCPS AQ-ID []  Garvin Fila, Pharm.D., BCPS []  Georgina Pillion, Pharm.D., BCPS []  [place], [address].D., BCPS, AAHIVP []  Estella Husk, Pharm.D., BCPS, AAHIVP [x]  Lysle Pearl, PharmD, BCPS []  Phillips Climes, PharmD, BCPS []  Agapito Games, PharmD, BCPS []  Verlan Friends, PharmD  Positive urine culture  []  Patient discharged without antimicrobial prescription and treatment is now indicated [x]  Organism is resistant to prescribed ED discharge antimicrobial []  Patient with positive blood cultures  Changes discussed with ED provider: Arthor Captain, PA New antibiotic prescription Cefdinir 300 mg PO BID x 7 days Called to [facility] [address] [place] [phone]  Contacted patient, date 05/29/2023, time 0930   Lysle Pearl 05/29/2023, 9:29 AM

## 2023-09-28 ENCOUNTER — Emergency Department (HOSPITAL_COMMUNITY): Payer: No Typology Code available for payment source

## 2023-09-28 ENCOUNTER — Inpatient Hospital Stay (HOSPITAL_COMMUNITY)
Admission: EM | Admit: 2023-09-28 | Discharge: 2023-10-04 | DRG: 871 | Disposition: A | Discharge status: Home / Self Care | Payer: No Typology Code available for payment source | Attending: Family Medicine | Admitting: Family Medicine

## 2023-09-28 ENCOUNTER — Other Ambulatory Visit: Payer: Self-pay

## 2023-09-28 ENCOUNTER — Encounter (HOSPITAL_COMMUNITY): Payer: Self-pay | Admitting: Student

## 2023-09-28 DIAGNOSIS — J189 Pneumonia, unspecified organism: Secondary | ICD-10-CM | POA: Insufficient documentation

## 2023-09-28 DIAGNOSIS — Z79899 Other long term (current) drug therapy: Secondary | ICD-10-CM

## 2023-09-28 DIAGNOSIS — Z944 Liver transplant status: Secondary | ICD-10-CM

## 2023-09-28 DIAGNOSIS — J09X1 Influenza due to identified novel influenza A virus with pneumonia: Secondary | ICD-10-CM | POA: Diagnosis present

## 2023-09-28 DIAGNOSIS — Z8249 Family history of ischemic heart disease and other diseases of the circulatory system: Secondary | ICD-10-CM

## 2023-09-28 DIAGNOSIS — Z940 Kidney transplant status: Secondary | ICD-10-CM | POA: Diagnosis not present

## 2023-09-28 DIAGNOSIS — E785 Hyperlipidemia, unspecified: Secondary | ICD-10-CM | POA: Diagnosis present

## 2023-09-28 DIAGNOSIS — D84821 Immunodeficiency due to drugs: Secondary | ICD-10-CM | POA: Diagnosis present

## 2023-09-28 DIAGNOSIS — D696 Thrombocytopenia, unspecified: Secondary | ICD-10-CM | POA: Diagnosis present

## 2023-09-28 DIAGNOSIS — Z881 Allergy status to other antibiotic agents status: Secondary | ICD-10-CM

## 2023-09-28 DIAGNOSIS — E8722 Chronic metabolic acidosis: Secondary | ICD-10-CM | POA: Diagnosis present

## 2023-09-28 DIAGNOSIS — A4189 Other specified sepsis: Principal | ICD-10-CM | POA: Diagnosis present

## 2023-09-28 DIAGNOSIS — G9341 Metabolic encephalopathy: Secondary | ICD-10-CM | POA: Diagnosis present

## 2023-09-28 DIAGNOSIS — Z1152 Encounter for screening for COVID-19: Secondary | ICD-10-CM

## 2023-09-28 DIAGNOSIS — E119 Type 2 diabetes mellitus without complications: Secondary | ICD-10-CM

## 2023-09-28 DIAGNOSIS — J811 Chronic pulmonary edema: Secondary | ICD-10-CM | POA: Diagnosis present

## 2023-09-28 DIAGNOSIS — Z79624 Long term (current) use of inhibitors of nucleotide synthesis: Secondary | ICD-10-CM

## 2023-09-28 DIAGNOSIS — N184 Chronic kidney disease, stage 4 (severe): Secondary | ICD-10-CM | POA: Diagnosis present

## 2023-09-28 DIAGNOSIS — I4891 Unspecified atrial fibrillation: Secondary | ICD-10-CM | POA: Diagnosis present

## 2023-09-28 DIAGNOSIS — Z7902 Long term (current) use of antithrombotics/antiplatelets: Secondary | ICD-10-CM

## 2023-09-28 DIAGNOSIS — J129 Viral pneumonia, unspecified: Secondary | ICD-10-CM | POA: Diagnosis present

## 2023-09-28 DIAGNOSIS — I129 Hypertensive chronic kidney disease with stage 1 through stage 4 chronic kidney disease, or unspecified chronic kidney disease: Secondary | ICD-10-CM | POA: Diagnosis present

## 2023-09-28 DIAGNOSIS — I69351 Hemiplegia and hemiparesis following cerebral infarction affecting right dominant side: Secondary | ICD-10-CM

## 2023-09-28 DIAGNOSIS — A419 Sepsis, unspecified organism: Secondary | ICD-10-CM | POA: Diagnosis not present

## 2023-09-28 DIAGNOSIS — I10 Essential (primary) hypertension: Secondary | ICD-10-CM | POA: Diagnosis present

## 2023-09-28 DIAGNOSIS — E870 Hyperosmolality and hypernatremia: Secondary | ICD-10-CM | POA: Diagnosis present

## 2023-09-28 DIAGNOSIS — E1122 Type 2 diabetes mellitus with diabetic chronic kidney disease: Secondary | ICD-10-CM | POA: Diagnosis present

## 2023-09-28 DIAGNOSIS — R748 Abnormal levels of other serum enzymes: Secondary | ICD-10-CM | POA: Diagnosis present

## 2023-09-28 DIAGNOSIS — J1008 Influenza due to other identified influenza virus with other specified pneumonia: Secondary | ICD-10-CM | POA: Diagnosis present

## 2023-09-28 DIAGNOSIS — F0390 Unspecified dementia without behavioral disturbance: Secondary | ICD-10-CM | POA: Diagnosis present

## 2023-09-28 DIAGNOSIS — E878 Other disorders of electrolyte and fluid balance, not elsewhere classified: Secondary | ICD-10-CM | POA: Diagnosis present

## 2023-09-28 DIAGNOSIS — Z7401 Bed confinement status: Secondary | ICD-10-CM

## 2023-09-28 DIAGNOSIS — G40909 Epilepsy, unspecified, not intractable, without status epilepticus: Secondary | ICD-10-CM | POA: Diagnosis present

## 2023-09-28 DIAGNOSIS — Z888 Allergy status to other drugs, medicaments and biological substances status: Secondary | ICD-10-CM

## 2023-09-28 DIAGNOSIS — D631 Anemia in chronic kidney disease: Secondary | ICD-10-CM | POA: Diagnosis present

## 2023-09-28 DIAGNOSIS — Z8619 Personal history of other infectious and parasitic diseases: Secondary | ICD-10-CM

## 2023-09-28 DIAGNOSIS — Z87891 Personal history of nicotine dependence: Secondary | ICD-10-CM

## 2023-09-28 DIAGNOSIS — J9601 Acute respiratory failure with hypoxia: Secondary | ICD-10-CM | POA: Diagnosis present

## 2023-09-28 DIAGNOSIS — N400 Enlarged prostate without lower urinary tract symptoms: Secondary | ICD-10-CM | POA: Diagnosis present

## 2023-09-28 DIAGNOSIS — M6282 Rhabdomyolysis: Secondary | ICD-10-CM | POA: Insufficient documentation

## 2023-09-28 DIAGNOSIS — N3000 Acute cystitis without hematuria: Secondary | ICD-10-CM | POA: Diagnosis present

## 2023-09-28 DIAGNOSIS — Z794 Long term (current) use of insulin: Secondary | ICD-10-CM

## 2023-09-28 DIAGNOSIS — D539 Nutritional anemia, unspecified: Secondary | ICD-10-CM | POA: Diagnosis present

## 2023-09-28 DIAGNOSIS — R4182 Altered mental status, unspecified: Secondary | ICD-10-CM

## 2023-09-28 DIAGNOSIS — E872 Acidosis, unspecified: Secondary | ICD-10-CM | POA: Diagnosis not present

## 2023-09-28 DIAGNOSIS — R1310 Dysphagia, unspecified: Secondary | ICD-10-CM

## 2023-09-28 DIAGNOSIS — J101 Influenza due to other identified influenza virus with other respiratory manifestations: Secondary | ICD-10-CM

## 2023-09-28 DIAGNOSIS — Z833 Family history of diabetes mellitus: Secondary | ICD-10-CM

## 2023-09-28 DIAGNOSIS — G4733 Obstructive sleep apnea (adult) (pediatric): Secondary | ICD-10-CM | POA: Diagnosis present

## 2023-09-28 DIAGNOSIS — E1165 Type 2 diabetes mellitus with hyperglycemia: Secondary | ICD-10-CM | POA: Diagnosis present

## 2023-09-28 DIAGNOSIS — D509 Iron deficiency anemia, unspecified: Secondary | ICD-10-CM | POA: Diagnosis present

## 2023-09-28 DIAGNOSIS — R6510 Systemic inflammatory response syndrome (SIRS) of non-infectious origin without acute organ dysfunction: Principal | ICD-10-CM

## 2023-09-28 LAB — CBC WITH DIFFERENTIAL/PLATELET
Abs Immature Granulocytes: 0.06 10*3/uL (ref 0.00–0.07)
Basophils Absolute: 0.1 10*3/uL (ref 0.0–0.1)
Basophils Relative: 1 %
Eosinophils Absolute: 0 10*3/uL (ref 0.0–0.5)
Eosinophils Relative: 0 %
HCT: 32.2 % — ABNORMAL LOW (ref 39.0–52.0)
Hemoglobin: 10.2 g/dL — ABNORMAL LOW (ref 13.0–17.0)
Immature Granulocytes: 1 %
Lymphocytes Relative: 5 %
Lymphs Abs: 0.5 10*3/uL — ABNORMAL LOW (ref 0.7–4.0)
MCH: 24.8 pg — ABNORMAL LOW (ref 26.0–34.0)
MCHC: 31.7 g/dL (ref 30.0–36.0)
MCV: 78.2 fL — ABNORMAL LOW (ref 80.0–100.0)
Monocytes Absolute: 0.8 10*3/uL (ref 0.1–1.0)
Monocytes Relative: 9 %
Neutro Abs: 7.4 10*3/uL (ref 1.7–7.7)
Neutrophils Relative %: 84 %
Platelets: 191 10*3/uL (ref 150–400)
RBC: 4.12 MIL/uL — ABNORMAL LOW (ref 4.22–5.81)
RDW: 16.5 % — ABNORMAL HIGH (ref 11.5–15.5)
WBC: 8.9 10*3/uL (ref 4.0–10.5)
nRBC: 0 % (ref 0.0–0.2)

## 2023-09-28 LAB — PHOSPHORUS: Phosphorus: 2.9 mg/dL (ref 2.5–4.6)

## 2023-09-28 LAB — URINALYSIS, W/ REFLEX TO CULTURE (INFECTION SUSPECTED)
Bilirubin Urine: NEGATIVE
Glucose, UA: NEGATIVE mg/dL
Hgb urine dipstick: NEGATIVE
Ketones, ur: NEGATIVE mg/dL
Nitrite: NEGATIVE
Protein, ur: 30 mg/dL — AB
Specific Gravity, Urine: 1.013 (ref 1.005–1.030)
pH: 6 (ref 5.0–8.0)

## 2023-09-28 LAB — CBG MONITORING, ED: Glucose-Capillary: 129 mg/dL — ABNORMAL HIGH (ref 70–99)

## 2023-09-28 LAB — COMPREHENSIVE METABOLIC PANEL
ALT: 12 U/L (ref 0–44)
AST: 15 U/L (ref 15–41)
Albumin: 3.3 g/dL — ABNORMAL LOW (ref 3.5–5.0)
Alkaline Phosphatase: 62 U/L (ref 38–126)
Anion gap: 12 (ref 5–15)
BUN: 48 mg/dL — ABNORMAL HIGH (ref 8–23)
CO2: 20 mmol/L — ABNORMAL LOW (ref 22–32)
Calcium: 10.3 mg/dL (ref 8.9–10.3)
Chloride: 110 mmol/L (ref 98–111)
Creatinine, Ser: 2.43 mg/dL — ABNORMAL HIGH (ref 0.61–1.24)
GFR, Estimated: 27 mL/min — ABNORMAL LOW (ref >60)
Glucose, Bld: 156 mg/dL — ABNORMAL HIGH (ref 70–99)
Potassium: 4.1 mmol/L (ref 3.5–5.1)
Sodium: 142 mmol/L (ref 135–145)
Total Bilirubin: 0.6 mg/dL (ref 0.0–1.2)
Total Protein: 7.1 g/dL (ref 6.5–8.1)

## 2023-09-28 LAB — HEMOGLOBIN A1C
Hgb A1c MFr Bld: 6.4 % — ABNORMAL HIGH (ref 4.8–5.6)
Mean Plasma Glucose: 136.98 mg/dL

## 2023-09-28 LAB — PROTIME-INR
INR: 1.3 — ABNORMAL HIGH (ref 0.8–1.2)
Prothrombin Time: 16.7 s — ABNORMAL HIGH (ref 11.4–15.2)

## 2023-09-28 LAB — MAGNESIUM: Magnesium: 2.1 mg/dL (ref 1.7–2.4)

## 2023-09-28 LAB — RESP PANEL BY RT-PCR (RSV, FLU A&B, COVID)  RVPGX2
Influenza A by PCR: POSITIVE — AB
Influenza B by PCR: NEGATIVE
Resp Syncytial Virus by PCR: NEGATIVE
SARS Coronavirus 2 by RT PCR: NEGATIVE

## 2023-09-28 LAB — PROCALCITONIN: Procalcitonin: 0.17 ng/mL

## 2023-09-28 LAB — I-STAT CG4 LACTIC ACID, ED: Lactic Acid, Venous: 1.7 mmol/L (ref 0.5–1.9)

## 2023-09-28 LAB — APTT: aPTT: 38 s — ABNORMAL HIGH (ref 24–36)

## 2023-09-28 LAB — GLUCOSE, CAPILLARY: Glucose-Capillary: 120 mg/dL — ABNORMAL HIGH (ref 70–99)

## 2023-09-28 MED ORDER — IPRATROPIUM-ALBUTEROL 0.5-2.5 (3) MG/3ML IN SOLN
3.0000 mL | Freq: Four times a day (QID) | RESPIRATORY_TRACT | Status: DC | PRN
  Administered 2023-09-29 – 2023-10-03 (×5): 3 mL via RESPIRATORY_TRACT
  Filled 2023-09-28 (×6): qty 3

## 2023-09-28 MED ORDER — VANCOMYCIN HCL IN DEXTROSE 1-5 GM/200ML-% IV SOLN: 1000.0000 mg | Freq: Once | INTRAVENOUS | Status: DC

## 2023-09-28 MED ORDER — POLYETHYLENE GLYCOL 3350 17 G PO PACK: 17.0000 g | PACK | Freq: Every day | ORAL | Status: DC | PRN

## 2023-09-28 MED ORDER — FINASTERIDE 5 MG PO TABS: 5.0000 mg | ORAL_TABLET | Freq: Every evening | ORAL | Status: DC

## 2023-09-28 MED ORDER — GUAIFENESIN ER 600 MG PO TB12
600.0000 mg | ORAL_TABLET | Freq: Two times a day (BID) | ORAL | Status: DC
  Administered 2023-09-28 – 2023-09-30 (×4): 600 mg via ORAL
  Filled 2023-09-28 (×4): qty 1

## 2023-09-28 MED ORDER — ACETAMINOPHEN 650 MG RE SUPP
650.0000 mg | Freq: Four times a day (QID) | RECTAL | Status: DC | PRN
Start: 2023-09-28 — End: 2023-10-04
  Administered 2023-09-28 – 2023-09-29 (×3): 650 mg via RECTAL
  Filled 2023-09-28 (×4): qty 1

## 2023-09-28 MED ORDER — LORAZEPAM 2 MG/ML IJ SOLN: 2.0000 mg | INTRAMUSCULAR | Status: DC | PRN

## 2023-09-28 MED ORDER — SODIUM CHLORIDE 0.9 % IV SOLN
1.0000 g | INTRAVENOUS | Status: DC
  Administered 2023-09-29 – 2023-10-04 (×6): 1 g via INTRAVENOUS
  Filled 2023-09-28 (×6): qty 10

## 2023-09-28 MED ORDER — VANCOMYCIN HCL 1500 MG/300ML IV SOLN
1500.0000 mg | Freq: Once | INTRAVENOUS | Status: AC
  Administered 2023-09-28: 1500 mg via INTRAVENOUS
  Filled 2023-09-28: qty 300

## 2023-09-28 MED ORDER — LEVETIRACETAM 750 MG PO TABS
750.0000 mg | ORAL_TABLET | Freq: Two times a day (BID) | ORAL | Status: DC
  Administered 2023-09-28 – 2023-09-29 (×2): 750 mg via ORAL
  Filled 2023-09-28 (×2): qty 1

## 2023-09-28 MED ORDER — CALCITRIOL 0.25 MCG PO CAPS
0.2500 ug | ORAL_CAPSULE | Freq: Every day | ORAL | Status: DC
  Administered 2023-09-28: 0.25 ug via ORAL
  Filled 2023-09-28 (×2): qty 1

## 2023-09-28 MED ORDER — LACTATED RINGERS IV BOLUS (SEPSIS)
1000.0000 mL | Freq: Once | INTRAVENOUS | Status: AC
  Administered 2023-09-28: 1000 mL via INTRAVENOUS

## 2023-09-28 MED ORDER — TAMSULOSIN HCL 0.4 MG PO CAPS
0.4000 mg | ORAL_CAPSULE | Freq: Every day | ORAL | Status: DC
Start: 2023-09-28 — End: 2023-09-29
  Administered 2023-09-28: 0.4 mg via ORAL

## 2023-09-28 MED ORDER — OSELTAMIVIR PHOSPHATE 75 MG PO CAPS
75.0000 mg | ORAL_CAPSULE | Freq: Once | ORAL | Status: AC
  Administered 2023-09-29: 75 mg via ORAL

## 2023-09-28 MED ORDER — CLOPIDOGREL BISULFATE 75 MG PO TABS
75.0000 mg | ORAL_TABLET | Freq: Every day | ORAL | Status: DC
  Administered 2023-09-29: 75 mg via ORAL
  Filled 2023-09-28: qty 1

## 2023-09-28 MED ORDER — HYDROCOD POLI-CHLORPHE POLI ER 10-8 MG/5ML PO SUER: 5.0000 mL | Freq: Two times a day (BID) | ORAL | Status: DC | PRN

## 2023-09-28 MED ORDER — SODIUM CHLORIDE 0.9% FLUSH
3.0000 mL | Freq: Two times a day (BID) | INTRAVENOUS | Status: DC
  Administered 2023-09-28 – 2023-10-04 (×11): 3 mL via INTRAVENOUS

## 2023-09-28 MED ORDER — INSULIN ASPART 100 UNIT/ML IJ SOLN
0.0000 [IU] | Freq: Three times a day (TID) | INTRAMUSCULAR | Status: DC
  Administered 2023-09-28: 2 [IU] via SUBCUTANEOUS

## 2023-09-28 MED ORDER — ROSUVASTATIN CALCIUM 5 MG PO TABS
10.0000 mg | ORAL_TABLET | Freq: Every evening | ORAL | Status: DC
  Administered 2023-09-28: 10 mg via ORAL

## 2023-09-28 MED ORDER — BISACODYL 5 MG PO TBEC: 10.0000 mg | DELAYED_RELEASE_TABLET | Freq: Every day | ORAL | Status: DC | PRN

## 2023-09-28 MED ORDER — SODIUM CHLORIDE 0.9 % IV SOLN: 250.0000 mL | INTRAVENOUS | Status: AC | PRN

## 2023-09-28 MED ORDER — SODIUM BICARBONATE 650 MG PO TABS
1300.0000 mg | ORAL_TABLET | Freq: Two times a day (BID) | ORAL | Status: DC
  Administered 2023-09-28: 1300 mg via ORAL
  Filled 2023-09-28: qty 2

## 2023-09-28 MED ORDER — MYCOPHENOLATE SODIUM 180 MG PO TBEC
180.0000 mg | DELAYED_RELEASE_TABLET | Freq: Two times a day (BID) | ORAL | Status: DC
Start: 2023-09-28 — End: 2023-10-04
  Administered 2023-09-28 – 2023-10-04 (×12): 180 mg via ORAL
  Filled 2023-09-28 (×13): qty 1

## 2023-09-28 MED ORDER — ONDANSETRON HCL 4 MG PO TABS: 4.0000 mg | ORAL_TABLET | Freq: Four times a day (QID) | ORAL | Status: DC | PRN

## 2023-09-28 MED ORDER — HEPARIN SODIUM (PORCINE) 5000 UNIT/ML IJ SOLN
5000.0000 [IU] | Freq: Three times a day (TID) | INTRAMUSCULAR | Status: DC
  Administered 2023-09-28 – 2023-10-04 (×18): 5000 [IU] via SUBCUTANEOUS
  Filled 2023-09-28 (×18): qty 1

## 2023-09-28 MED ORDER — METRONIDAZOLE 500 MG/100ML IV SOLN
500.0000 mg | Freq: Once | INTRAVENOUS | Status: AC
Start: 2023-09-28 — End: 2023-09-28
  Administered 2023-09-28: 500 mg via INTRAVENOUS
  Filled 2023-09-28: qty 100

## 2023-09-28 MED ORDER — ACETAMINOPHEN 325 MG PO TABS: 650.0000 mg | ORAL_TABLET | Freq: Four times a day (QID) | ORAL | Status: DC | PRN

## 2023-09-28 MED ORDER — SODIUM CHLORIDE 0.9% FLUSH: 3.0000 mL | INTRAVENOUS | Status: DC | PRN

## 2023-09-28 MED ORDER — CYCLOSPORINE MODIFIED (NEORAL) 100 MG PO CAPS
200.0000 mg | ORAL_CAPSULE | Freq: Two times a day (BID) | ORAL | Status: DC
  Administered 2023-09-28: 200 mg via ORAL
  Filled 2023-09-28 (×2): qty 2

## 2023-09-28 MED ORDER — HYDRALAZINE HCL 50 MG PO TABS
50.0000 mg | ORAL_TABLET | Freq: Three times a day (TID) | ORAL | Status: DC
  Administered 2023-09-28 – 2023-09-29 (×2): 50 mg via ORAL
  Filled 2023-09-28 (×2): qty 1

## 2023-09-28 MED ORDER — OSELTAMIVIR PHOSPHATE 30 MG PO CAPS
30.0000 mg | ORAL_CAPSULE | Freq: Two times a day (BID) | ORAL | Status: AC
  Administered 2023-09-28 – 2023-10-03 (×9): 30 mg via ORAL
  Filled 2023-09-28 (×11): qty 1

## 2023-09-28 MED ORDER — ACETAMINOPHEN 80 MG RE SUPP
500.0000 mg | Freq: Once | RECTAL | Status: AC
  Administered 2023-09-28: 500 mg via RECTAL
  Filled 2023-09-28: qty 4

## 2023-09-28 MED ORDER — SODIUM CHLORIDE 0.9 % IV SOLN
2.0000 g | Freq: Once | INTRAVENOUS | Status: AC
  Administered 2023-09-28: 2 g via INTRAVENOUS
  Filled 2023-09-28: qty 12.5

## 2023-09-28 MED ORDER — ONDANSETRON HCL 4 MG/2ML IJ SOLN: 4.0000 mg | Freq: Four times a day (QID) | INTRAMUSCULAR | Status: DC | PRN

## 2023-09-28 NOTE — ED Notes (Signed)
All PO medications held due to patient being lethargic, unable to follow commands. Still waiting on SLP evaluation.

## 2023-09-28 NOTE — ED Notes (Addendum)
Pt placed on 2L, RA sat 93% and pt tachypenic with RR 31. ED notified.

## 2023-09-28 NOTE — H&P (Signed)
[facility] History and Physical   Patient: Gilbert Wilson WGN:[id number]   PCP: [facility] DOB: [birth year]   DOA: 09/28/2023   DOS: 09/28/2023   DOS: the patient was seen and examined on 09/28/2023  Patient coming from: The patient is coming from Home  Chief Complaint: Sob and Fever   HPI: Raymone Pembroke is a 78 y.o. male with PMH of HTN, IDDM T2, A. Fib s/p ablation, CVA w/right-sided hemiplegia, ESRD s/p renal transplant in 2016, chronic hepatitis C with cirrhosis, liver failure s/p liver transplant in 2016, dementia, seizure disorder, and BPH as reviewed from EMR, presented at [facility], ED from home with complaining of shortness of breath and cough which is getting worse for past 2 days and last night he had a fever and one-time vomiting.  Recently patient was diagnosed with UTI and was treated with ceftriaxone. HPI limited due to dementia and altered mental status due to current illness.  Information got from patient's wife at bedside.  As per her patient is AO x 1 to 2, waxing and waning mental status due to dementia, but currently he is below his baseline.  ED Course: VS Tmax 102.3, HR 103, RR 26 -33, BP 144/60, saturating 96% on 2 L BMP: Glucose 156, B/Cr 48/2.43, CO2 20, lactic acid 1.7 within normal range CBC WBC 8.9 within normal range, hemoglobin 10.2 macrocytic anemia Follow procalcitonin UA LE large, nitrate negative, bacteria rare, WBC 11-20, partially treated with antibiotics Follow urine culture and blood culture CXR: 1. Low lung volumes with chronic elevation of the right hemidiaphragm. Streaky opacities at the medial right lung base could represent atelectasis or infiltrate. 2. Stable cardiomegaly.   Review of Systems: as mentioned in the history of present illness.  All other systems reviewed and are negative.  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 His smoking use included cigarettes. He has never used smokeless tobacco. He reports that he does not drink alcohol and does not use drugs.  Allergies  Allergen Reactions   Levaquin [Levofloxacin] Itching   Linezolid     Seizures   Lisinopril Cough     Family history reviewed and not pertinent Family History  Problem Relation Age of Onset   Hypertension Mother    Diabetes Mother    Diabetes Father      Prior to Admission medications   Medication Sig Start Date End Date Taking? Authorizing Provider  acetaminophen (TYLENOL) 500 MG tablet Take 1,000 mg by mouth every 6 (six) hours as needed for moderate pain, headache or fever.    [provider]  calcitRIOL (ROCALTROL) 0.25 MCG capsule Take 0.25 mcg by mouth daily.    [provider]  cephALEXin (KEFLEX) 500 MG capsule Take 1 capsule (500 mg total) by mouth 3 (three) times daily. 08/19/22   Terrilee Files, MD  clopidogrel (PLAVIX) 75 MG tablet Take 75 mg by mouth daily.    [provider]  cycloSPORINE modified (NEORAL) 100 MG capsule Take 150 mg by mouth 2 (two) times daily.    [provider]  Fe Bisgly-Succ-C-Thre-B12-FA (IRON-150 PO) Take 1 tablet by mouth daily.    [provider]  finasteride (PROSCAR) 5 MG tablet Take 5 mg by mouth every evening.    [provider]  hydrALAZINE (APRESOLINE) 50 MG tablet Take 1 tablet (50 mg total) by mouth 3 (three) times daily. 01/04/22   Rolly Salter, MD  insulin glargine (LANTUS SOLOSTAR) 100  UNIT/ML Solostar Pen Inject 0-6 Units into the skin at bedtime. If BS>130-Take 6 units, Increase 1 unit for every 10 mg/dl above 119.    [provider]  levETIRAcetam (KEPPRA) 750 MG tablet Take 1 tablet (750 mg total) by mouth 2 (two) times daily. 04/29/23   Gerhard Munch, MD  MAGNESIUM OXIDE 400 PO Take 400 mg by mouth daily.    [provider]  mirtazapine (REMERON) 15 MG tablet Take 0.5 tablets (7.5 mg total) by mouth at bedtime. 01/04/22   Rolly Salter, MD  mycophenolate (MYFORTIC) 180 MG EC tablet Take 180 mg by mouth 2 (two) times daily.    [provider]  rosuvastatin (CRESTOR) 10 MG tablet Take 10 mg by mouth every evening.    [provider]  sodium bicarbonate 650 MG tablet Take 1,300 mg by mouth 2 (two) times daily.    [provider]    Physical Exam: Vitals:   09/28/23 1130 09/28/23 1200 09/28/23 1230 09/28/23 1300  BP: (!) 150/63 (!) 148/61 (!) 159/62 (!) 144/60  Pulse: 97 95 92 89  Resp: (!) 28 (!) 28 (!) 27 (!) 28  Temp:      TempSrc:      SpO2: 96% 96% 96% 96%  Weight:        General: lethargic and not oriented to time, place, and person. Appear in mild distress, affect depressed Eyes: PERRLA, Conjunctiva normal ENT: Oral Mucosa Clear, moist  Neck: no JVD, no Abnormal Mass Or lumps Cardiovascular: S1 and S2 Present, no Murmur, peripheral pulses symmetrical Respiratory: increased respiratory effort, Bilateral Air entry equal and Decreased, no signs of accessory muscle use, bilateral crackles, mild wheezes Abdomen: Bowel Sound present, Soft and no tenderness, no hernia Skin: no rashes Extremities: no Pedal edema, no calf tenderness Neurologic: Right hemiplegia residual weakness due to prior CVA Gait not checked due to patient safety concerns  Data Reviewed: I have personally reviewed and interpreted labs, imaging as discussed below.  CBC: Recent Labs  Lab 09/28/23 0907  WBC 8.9  NEUTROABS 7.4  HGB 10.2*  HCT  32.2*  MCV 78.2*  PLT 191   Basic Metabolic Panel: Recent Labs  Lab 09/28/23 0907  NA 142  K 4.1  CL 110  CO2 20*  GLUCOSE 156*  BUN 48*  CREATININE 2.43*  CALCIUM 10.3   GFR: Estimated Creatinine Clearance: 29.2 mL/min (A) (by C-G formula based on SCr of 2.43 mg/dL (H)). Liver Function Tests: Recent Labs  Lab 09/28/23 0907  AST 15  ALT 12  ALKPHOS 62  BILITOT 0.6  PROT 7.1  ALBUMIN 3.3*   No results for input(s): "LIPASE", "AMYLASE" in the last 168 hours. No results for input(s): "AMMONIA" in the last 168 hours. Coagulation Profile: Recent Labs  Lab 09/28/23 0907  INR 1.3*   Cardiac Enzymes: No results for input(s): "CKTOTAL", "CKMB", "CKMBINDEX", "  TROPONINI" in the last 168 hours. BNP (last 3 results) No results for input(s): "PROBNP" in the last 8760 hours. HbA1C: No results for input(s): "HGBA1C" in the last 72 hours. CBG: No results for input(s): "GLUCAP" in the last 168 hours. Lipid Profile: No results for input(s): "CHOL", "HDL", "LDLCALC", "TRIG", "CHOLHDL", "LDLDIRECT" in the last 72 hours. Thyroid Function Tests: No results for input(s): "TSH", "T4TOTAL", "FREET4", "T3FREE", "THYROIDAB" in the last 72 hours. Anemia Panel: No results for input(s): "VITAMINB12", "FOLATE", "FERRITIN", "TIBC", "IRON", "RETICCTPCT" in the last 72 hours. Urine analysis:    Component Value Date/Time   COLORURINE YELLOW 09/28/2023 1126   APPEARANCEUR HAZY (A) 09/28/2023 1126   LABSPEC 1.013 09/28/2023 1126   PHURINE 6.0 09/28/2023 1126   GLUCOSEU NEGATIVE 09/28/2023 1126   HGBUR NEGATIVE 09/28/2023 1126   BILIRUBINUR NEGATIVE 09/28/2023 1126   KETONESUR NEGATIVE 09/28/2023 1126   PROTEINUR 30 (A) 09/28/2023 1126   NITRITE NEGATIVE 09/28/2023 1126   LEUKOCYTESUR LARGE (A) 09/28/2023 1126    Radiological Exams on Admission: DG Chest Port 1 View Result Date: 09/28/2023 CLINICAL DATA:  Possible syncopal episode. EXAM: PORTABLE CHEST 1 VIEW COMPARISON:  Chest  radiograph dated Jan 01, 2022. FINDINGS: Low lung volumes. Stable cardiomegaly. Chronic elevation of the right hemidiaphragm. Streaky opacities at the medial right lung base. The left lung appears clear. No sizable pleural effusion or pneumothorax. No acute osseous abnormality identified. IMPRESSION: 1. Low lung volumes with chronic elevation of the right hemidiaphragm. Streaky opacities at the medial right lung base could represent atelectasis or infiltrate. 2. Stable cardiomegaly. Electronically Signed   By: Hart Robinsons M.D.   On: 09/28/2023 09:40   EKG: Independently reviewed. normal EKG, normal sinus rhythm, unchanged from previous tracings. Echocardiogram:  11/25/2021 TTE shows LVEF 665%, grade 1 diastolic dysfunction.  I reviewed all nursing notes, pharmacy notes, vitals, pertinent old records.  Assessment/Plan Principal Problem:   Influenza A with pneumonia   Sepsis secondary to influenza A virus pneumonia Fever, tachycardia and tachypnea, hypoxia, x-ray positive for pneumonia Lactic acid within normal range Avoided excessive fluid due to respiratory failure and mild pulmonary edema Continue treatment as below   Acute hypoxic respiratory failure secondary to influenza A viral pneumonia Continue Tamiflu, renal dose S/p cefepime, Flagyl and vancomycin given in the ED WBC count within normal range and lactic acid within normal range, most likely it is viral infection, so we will hold off antibiotics, started ceftriaxone for possible UTI Mucinex 600 mg p.o. twice daily, Tussionex as needed for cough DuoNeb as needed Continue supplemental O2 inhalation and gradually wean off Continue aspiration precautions, follow SLP eval  H/o UTI, patient was taking Keflex, which was started as an outpatient recently due to UTI  UA not very impressive Started ceftriaxone 1 g IV daily Follow urine culture   HTN, h/o A.fib s/p ablation, currently in sinus rhythm Continued hydralazine 50 mg  p.o. 3 times daily home dose Patient's not on AV nodal blocker are DOAC Monitor on telemetry Monitor BP and titrate medications accordingly   Seizure disorder, continued Keppra home dose Continue seizure precautions, use Ativan as needed  H/o CVA with residual right-sided hemiplegia Continue Plavix and statin Follow SLP eval PT and OT eval  ESRD s/p renal transplant 2016 Hep C, liver cirrhosis s/p liver transplant 2016 Continued mycophenolate and cyclosporine Continue Calcitrol and bicarbonate Monitor renal functions, use renally dose medications Avoid nephrotoxic medications and hepatotoxic medications  IDDM T2 Held home regimen for now Started NovoLog sliding scale Monitor  CBG and continue diabetic diet  Dementia, patient is AO x 1 to 2 at baseline Continue supportive care  BPH, continued Proscar 5 mg p.o. home dose Started Flomax 0.4 mg nightly Check bladder scan as needed  Nutrition: Diabetic diet DVT Prophylaxis: Subcutaneous Heparin   Advance goals of care discussion: Full code   Consults: None  Family Communication: family was present at bedside, at the time of interview.  Opportunity was given to ask question and all questions were answered satisfactorily.  Disposition: Admitted as inpatient, telemetry unit. Likely to be discharged Home w/HH vs SNF TBD after PT/OT eval, in 2-3 days.  I have discussed plan of care as described above with RN and patient/family.  Severity of Illness: The appropriate patient status for this patient is INPATIENT. Inpatient status is judged to be reasonable and necessary in order to provide the required intensity of service to ensure the patient's safety. The patient's presenting symptoms, physical exam findings, and initial radiographic and laboratory data in the context of their chronic comorbidities is felt to place them at high risk for further clinical deterioration. Furthermore, it is not anticipated that the patient will be  medically stable for discharge from the hospital within 2 midnights of admission.   * I certify that at the point of admission it is my clinical judgment that the patient will require inpatient hospital care spanning beyond 2 midnights from the point of admission due to high intensity of service, high risk for further deterioration and high frequency of surveillance required.*   Author: Gillis Santa, MD [facility] 09/28/2023 1:27 PM   To reach On-call, see care teams to locate the attending and reach out to them via [email or web address]. If 7PM-7AM, please contact night-coverage If you still have difficulty reaching the attending provider, please page the [facility] (Director on Call) for [facility] on amion for assistance.

## 2023-09-28 NOTE — Inpatient Diabetes Management (Signed)
Inpatient Diabetes Program Recommendations  AACE/ADA: New Consensus Statement on Inpatient Glycemic Control (2015)  Target Ranges:  Prepandial:   less than 140 mg/dL      Peak postprandial:   less than 180 mg/dL (1-2 hours)      Critically ill patients:  140 - 180 mg/dL   Lab Results  Component Value Date   GLUCAP 118 (H) 04/29/2023   HGBA1C 6.5 (H) 11/25/2021    Review of Glycemic Control  Diabetes history: DM2 Outpatient Diabetes medications: Lantus 0-6 units at HS (BS>130 mg/dl, give 6 units) Current orders for Inpatient glycemic control: Novolog 0-15 units correction scale TID  Inpatient Diabetes Program Recommendations:   Received diabetes coordinator consult for continuous glucose monitoring. Will continue to follow blood sugars while in the hospital.   Smith Mince RN BSN CDE Diabetes Coordinator Pager: [phone]  8am-5pm

## 2023-09-28 NOTE — ED Provider Notes (Signed)
[place] EMERGENCY DEPARTMENT AT [facility] Provider Note   CSN: [id number] Arrival date & time: 09/28/23  4098     History  Chief Complaint  Patient presents with   Code Sepsis    Gilbert Wilson is a 78 y.o. male.  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  Symptoms started yesterday.  The patient was recently seen at the [facility] and started on amoxicillin due to concern for UTI.  Over the last 24 hours he has developed worsening shortness of breath, cough, worsening change in mental status.  Per family, he is below his current baseline mental status for his dementia.  Persistent respiratory symptoms at home prompting presentation to the emergency department.  Home Medications Prior to Admission medications   Medication Sig Start Date End Date Taking? Authorizing Provider  acetaminophen (TYLENOL) 500 MG tablet Take 1,000 mg by mouth every 6 (six) hours as needed for moderate pain, headache or fever.    [provider]  calcitRIOL (ROCALTROL) 0.25 MCG capsule Take 0.25 mcg by mouth daily.    [provider]  cephALEXin (KEFLEX) 500 MG capsule Take 1 capsule (500 mg total) by mouth 3 (three) times daily. 08/19/22   Terrilee Files, MD  clopidogrel (PLAVIX) 75 MG tablet Take 75 mg by mouth daily.    [provider]  cycloSPORINE modified (NEORAL) 100 MG capsule Take 150 mg by mouth 2 (two) times daily.    [provider]  Fe Bisgly-Succ-C-Thre-B12-FA (IRON-150 PO) Take 1 tablet by mouth daily.    [provider]  finasteride (PROSCAR) 5 MG tablet Take 5 mg by mouth every evening.    [provider]  hydrALAZINE (APRESOLINE) 50 MG tablet Take 1 tablet (50 mg total) by mouth 3 (three) times daily.  01/04/22   Rolly Salter, MD  insulin glargine (LANTUS SOLOSTAR) 100 UNIT/ML Solostar Pen Inject 0-6 Units into the skin at bedtime. If BS>130-Take 6 units, Increase 1 unit for every 10 mg/dl above 119.    [provider]  levETIRAcetam (KEPPRA) 750 MG tablet Take 1 tablet (750 mg total) by mouth 2 (two) times daily. 04/29/23   Gerhard Munch, MD  MAGNESIUM OXIDE 400 PO Take 400 mg by mouth daily.    [provider]  mirtazapine (REMERON) 15 MG tablet Take 0.5 tablets (7.5 mg total) by mouth at bedtime. 01/04/22   Rolly Salter, MD  mycophenolate (MYFORTIC) 180 MG EC tablet Take 180 mg by mouth 2 (two) times daily.    [provider]  rosuvastatin (CRESTOR) 10 MG tablet Take 10 mg by mouth every evening.    [provider]  sodium bicarbonate 650 MG tablet Take 1,300 mg by mouth 2 (two) times daily.    [provider]      Allergies    Levaquin [levofloxacin], Linezolid, and Lisinopril    Review of Systems   Review of Systems  Unable to perform ROS: Dementia  All other systems reviewed and are negative.   Physical Exam Updated Vital Signs BP (!) 144/60   Pulse 89   Temp (!) 102.3 F (39.1 C) (Oral)   Resp (!) 28   Wt 83.9 kg   SpO2 96%   BMI 24.41 kg/m  Physical Exam Vitals and nursing note  reviewed.  Constitutional:      General: He is not in acute distress. HENT:     Head: Normocephalic and atraumatic.  Eyes:     Conjunctiva/sclera: Conjunctivae normal.     Pupils: Pupils are equal, round, and reactive to light.  Cardiovascular:     Rate and Rhythm: Normal rate and regular rhythm.  Pulmonary:     Effort: Tachypnea present.     Breath sounds: Rhonchi and rales present.  Abdominal:     General: There is no distension.     Tenderness: There is no guarding.  Musculoskeletal:        General: No deformity or signs of injury.     Cervical back: Neck supple.  Skin:    Findings: No lesion or rash.  Neurological:     General:  No focal deficit present.     Mental Status: He is alert. Mental status is at baseline.     ED Results / Procedures / Treatments   Labs (all labs ordered are listed, but only abnormal results are displayed) Labs Reviewed  RESP PANEL BY RT-PCR (RSV, FLU A&B, COVID)  RVPGX2 - Abnormal; Notable for the following components:      Result Value   Influenza A by PCR POSITIVE (*)    All other components within normal limits  COMPREHENSIVE METABOLIC PANEL - Abnormal; Notable for the following components:   CO2 20 (*)    Glucose, Bld 156 (*)    BUN 48 (*)    Creatinine, Ser 2.43 (*)    Albumin 3.3 (*)    GFR, Estimated 27 (*)    All other components within normal limits  CBC WITH DIFFERENTIAL/PLATELET - Abnormal; Notable for the following components:   RBC 4.12 (*)    Hemoglobin 10.2 (*)    HCT 32.2 (*)    MCV 78.2 (*)    MCH 24.8 (*)    RDW 16.5 (*)    Lymphs Abs 0.5 (*)    All other components within normal limits  PROTIME-INR - Abnormal; Notable for the following components:   Prothrombin Time 16.7 (*)    INR 1.3 (*)    All other components within normal limits  APTT - Abnormal; Notable for the following components:   aPTT 38 (*)    All other components within normal limits  URINALYSIS, W/ REFLEX TO CULTURE (INFECTION SUSPECTED) - Abnormal; Notable for the following components:   APPearance HAZY (*)    Protein, ur 30 (*)    Leukocytes,Ua LARGE (*)    Bacteria, UA RARE (*)    All other components within normal limits  CULTURE, BLOOD (ROUTINE X 2)  CULTURE, BLOOD (ROUTINE X 2)  URINE CULTURE  CBC  PHOSPHORUS  MAGNESIUM  PROCALCITONIN  I-STAT CG4 LACTIC ACID, ED    EKG EKG Interpretation Date/Time:  Monday September 28 2023 08:47:39 EST Ventricular Rate:  97 PR Interval:  200 QRS Duration:  120 QT Interval:  324 QTC Calculation: 412 R Axis:   -46  Text Interpretation: Sinus rhythm LVH with IVCD, LAD and secondary repol abnrm Confirmed by Ernie Avena ([id number]) on  09/28/2023 8:59:15 AM  Radiology DG Chest Port 1 View Result Date: 09/28/2023 CLINICAL DATA:  Possible syncopal episode. EXAM: PORTABLE CHEST 1 VIEW COMPARISON:  Chest radiograph dated Jan 01, 2022. FINDINGS: Low lung volumes. Stable cardiomegaly. Chronic elevation of the right hemidiaphragm. Streaky opacities at the medial right lung base. The left lung appears clear. No sizable pleural effusion or pneumothorax. No  acute osseous abnormality identified. IMPRESSION: 1. Low lung volumes with chronic elevation of the right hemidiaphragm. Streaky opacities at the medial right lung base could represent atelectasis or infiltrate. 2. Stable cardiomegaly. Electronically Signed   By: Hart Robinsons M.D.   On: 09/28/2023 09:40    Procedures Procedures    Medications Ordered in ED Medications  oseltamivir (TAMIFLU) capsule 75 mg (has no administration in time range)    Followed by  oseltamivir (TAMIFLU) capsule 30 mg (has no administration in time range)  heparin injection 5,000 Units (has no administration in time range)  sodium chloride flush (NS) 0.9 % injection 3 mL (has no administration in time range)  sodium chloride flush (NS) 0.9 % injection 3 mL (has no administration in time range)  0.9 %  sodium chloride infusion (has no administration in time range)  acetaminophen (TYLENOL) tablet 650 mg (has no administration in time range)    Or  acetaminophen (TYLENOL) suppository 650 mg (has no administration in time range)  polyethylene glycol (MIRALAX / GLYCOLAX) packet 17 g (has no administration in time range)  bisacodyl (DULCOLAX) EC tablet 10 mg (has no administration in time range)  ondansetron (ZOFRAN) tablet 4 mg (has no administration in time range)    Or  ondansetron (ZOFRAN) injection 4 mg (has no administration in time range)  guaiFENesin (MUCINEX) 12 hr tablet 600 mg (has no administration in time range)  ipratropium-albuterol (DUONEB) 0.5-2.5 (3) MG/3ML nebulizer solution 3 mL (has  no administration in time range)  cefTRIAXone (ROCEPHIN) 1 g in sodium chloride 0.9 % 100 mL IVPB (has no administration in time range)  chlorpheniramine-HYDROcodone (TUSSIONEX) 10-8 MG/5ML suspension 5 mL (has no administration in time range)  calcitRIOL (ROCALTROL) capsule 0.25 mcg (has no administration in time range)  clopidogrel (PLAVIX) tablet 75 mg (has no administration in time range)  cycloSPORINE modified (NEORAL) capsule 200 mg (has no administration in time range)  finasteride (PROSCAR) tablet 5 mg (has no administration in time range)  hydrALAZINE (APRESOLINE) tablet 50 mg (has no administration in time range)  levETIRAcetam (KEPPRA) tablet 750 mg (has no administration in time range)  mycophenolate (MYFORTIC) EC tablet 180 mg (has no administration in time range)  rosuvastatin (CRESTOR) tablet 10 mg (has no administration in time range)  sodium bicarbonate tablet 1,300 mg (has no administration in time range)  lactated ringers bolus 1,000 mL (0 mLs Intravenous Stopped 09/28/23 1034)  acetaminophen (TYLENOL) suppository 500 mg (500 mg Rectal Given 09/28/23 1026)  ceFEPIme (MAXIPIME) 2 g in sodium chloride 0.9 % 100 mL IVPB (0 g Intravenous Stopped 09/28/23 1034)  metroNIDAZOLE (FLAGYL) IVPB 500 mg (0 mg Intravenous Stopped 09/28/23 1228)  vancomycin (VANCOREADY) IVPB 1500 mg/300 mL (0 mg Intravenous Stopped 09/28/23 1253)    ED Course/ Medical Decision Making/ A&P Clinical Course as of 09/28/23 1326  Mon Sep 28, 2023  0921 Temp(!): 102.3 F (39.1 C) [JL]  0921 Pulse Rate: 96 [JL]  0924 Resp(!): 33 [JL]  1012 Influenza A By PCR(!): POSITIVE [JL]  1325 Leukocytes,Ua(!): LARGE [JL]  1325 WBC, UA: 11-20 [JL]  1325 Bacteria, UA(!): RARE [JL]    Clinical Course User Index [JL] Ernie Avena, MD                                 Medical Decision Making Amount and/or Complexity of Data Reviewed Labs: ordered. Decision-making details documented in ED Course. Radiology:  ordered.  Risk Prescription  drug management. Decision regarding hospitalization.    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  Symptoms started yesterday.  The patient was recently seen at the [facility] and started on amoxicillin due to concern for UTI.  Over the last 24 hours he has developed worsening shortness of breath, cough, worsening change in mental status.  Per family, he is below his current baseline mental status for his dementia.  Persistent respiratory symptoms at home prompting presentation to the emergency department.  On arrival, the patient was febrile to 102.3, borderline tachycardic heart rate 96, tachypneic RR 33, BP 144/60, saturating 95% on room air.  Patient with rhonchi and rales present throughout multiple lung fields.  Considered viral infection, considered bacterial pneumonia, concern for sepsis and code sepsis initiated and the patient is already on antibiotics for treatment for UTI.  Broad-spectrum antibiotics were initiated to include vancomycin and cefepime.  Previous urine cultures were positive for Serratia.  CXR: IMPRESSION: 1. Low lung volumes with chronic elevation of the right hemidiaphragm. Streaky opacities at the medial right lung base could represent atelectasis or infiltrate. 2. Stable cardiomegaly.   Labs: PCR testing positive for influenza A, CBC without a leukocytosis, mild anemia to 10.2, CMP with hyperglycemia to 156, serum creatinine at baseline at 2.43, cultures collected and pending, lactic acid normal at 1.7, urinalysis collected via In-N-Out catheter and pending.  Patient persistently tachypneic with resting O2 sats dropping to 91%, placed on 2 L O2 for persistent tachypnea. UA remains positive for UTI.  Hospitalist medicine  consulted for admission.   Final Clinical Impression(s) / ED Diagnoses Final diagnoses:  SIRS (systemic inflammatory response syndrome) (HCC)  Influenza A  Altered mental status, unspecified altered mental status type  Acute cystitis without hematuria    Rx / DC Orders ED Discharge Orders     None         Ernie Avena, MD 09/28/23 1958

## 2023-09-28 NOTE — Sepsis Progress Note (Signed)
Sepsis protocol monitored by eLink ?

## 2023-09-28 NOTE — ED Notes (Signed)
This RN notified Licensed conveyancer that pt is coming up to floor.

## 2023-09-28 NOTE — ED Triage Notes (Signed)
Pt BIB [facility] from home with reports of cough, SHOB and fever that started yesterday. Family reports pt recently treated for UTI.

## 2023-09-28 NOTE — ED Notes (Signed)
Attempted to call unit to notify of patient arrival to floor. No answer at this time.

## 2023-09-28 NOTE — ED Notes (Signed)
Message sent to MD regarding patient route of medication.

## 2023-09-29 DIAGNOSIS — J09X1 Influenza due to identified novel influenza A virus with pneumonia: Secondary | ICD-10-CM | POA: Diagnosis not present

## 2023-09-29 LAB — RESPIRATORY PANEL BY PCR

## 2023-09-29 LAB — URINE CULTURE: Culture: NO GROWTH

## 2023-09-29 LAB — BASIC METABOLIC PANEL
Anion gap: 10 (ref 5–15)
BUN: 47 mg/dL — ABNORMAL HIGH (ref 8–23)
CO2: 19 mmol/L — ABNORMAL LOW (ref 22–32)
Calcium: 9.3 mg/dL (ref 8.9–10.3)
Chloride: 115 mmol/L — ABNORMAL HIGH (ref 98–111)
Creatinine, Ser: 2.36 mg/dL — ABNORMAL HIGH (ref 0.61–1.24)
GFR, Estimated: 28 mL/min — ABNORMAL LOW (ref >60)
Glucose, Bld: 101 mg/dL — ABNORMAL HIGH (ref 70–99)
Potassium: 4.1 mmol/L (ref 3.5–5.1)
Sodium: 144 mmol/L (ref 135–145)

## 2023-09-29 LAB — CBC
HCT: 27.2 % — ABNORMAL LOW (ref 39.0–52.0)
Hemoglobin: 8.7 g/dL — ABNORMAL LOW (ref 13.0–17.0)
MCH: 24.2 pg — ABNORMAL LOW (ref 26.0–34.0)
MCHC: 32 g/dL (ref 30.0–36.0)
MCV: 75.8 fL — ABNORMAL LOW (ref 80.0–100.0)
Platelets: 128 10*3/uL — ABNORMAL LOW (ref 150–400)
RBC: 3.59 MIL/uL — ABNORMAL LOW (ref 4.22–5.81)
RDW: 16.2 % — ABNORMAL HIGH (ref 11.5–15.5)
WBC: 8.4 10*3/uL (ref 4.0–10.5)
nRBC: 0 % (ref 0.0–0.2)

## 2023-09-29 LAB — CK: Total CK: 1708 U/L — ABNORMAL HIGH (ref 49–397)

## 2023-09-29 LAB — PHOSPHORUS: Phosphorus: 3.3 mg/dL (ref 2.5–4.6)

## 2023-09-29 LAB — FOLATE: Folate: 13.8 ng/mL (ref >5.9)

## 2023-09-29 LAB — GLUCOSE, CAPILLARY
Glucose-Capillary: 90 mg/dL (ref 70–99)
Glucose-Capillary: 88 mg/dL (ref 70–99)
Glucose-Capillary: 116 mg/dL — ABNORMAL HIGH (ref 70–99)
Glucose-Capillary: 138 mg/dL — ABNORMAL HIGH (ref 70–99)

## 2023-09-29 LAB — IRON AND TIBC
Iron: 7 ug/dL — ABNORMAL LOW (ref 45–182)
Saturation Ratios: 4 % — ABNORMAL LOW (ref 17.9–39.5)
TIBC: 176 ug/dL — ABNORMAL LOW (ref 250–450)
UIBC: 169 ug/dL

## 2023-09-29 LAB — MAGNESIUM: Magnesium: 1.9 mg/dL (ref 1.7–2.4)

## 2023-09-29 LAB — VITAMIN B12: Vitamin B-12: 380 pg/mL (ref 180–914)

## 2023-09-29 LAB — VITAMIN D 25 HYDROXY (VIT D DEFICIENCY, FRACTURES): Vit D, 25-Hydroxy: 62.09 ng/mL (ref 30–100)

## 2023-09-29 MED ORDER — INSULIN ASPART 100 UNIT/ML IJ SOLN: 0.0000 [IU] | INTRAMUSCULAR | Status: DC

## 2023-09-29 MED ORDER — CYCLOSPORINE MODIFIED (NEORAL) 25 MG PO CAPS
150.0000 mg | ORAL_CAPSULE | Freq: Two times a day (BID) | ORAL | Status: DC
  Administered 2023-09-29 – 2023-10-04 (×11): 150 mg via ORAL
  Filled 2023-09-29 (×12): qty 6

## 2023-09-29 MED ORDER — FERROUS SULFATE 325 (65 FE) MG PO TABS: 325.0000 mg | ORAL_TABLET | Freq: Every day | ORAL | Status: DC

## 2023-09-29 MED ORDER — HYDRALAZINE HCL 20 MG/ML IJ SOLN: 10.0000 mg | Freq: Four times a day (QID) | INTRAMUSCULAR | Status: DC | PRN

## 2023-09-29 MED ORDER — AMLODIPINE BESYLATE 10 MG PO TABS
10.0000 mg | ORAL_TABLET | Freq: Every day | ORAL | Status: DC
  Administered 2023-09-29: 10 mg via ORAL
  Filled 2023-09-29: qty 1

## 2023-09-29 MED ORDER — SODIUM CHLORIDE 0.9 % IV SOLN: INTRAVENOUS | Status: DC

## 2023-09-29 MED ORDER — SODIUM CHLORIDE 0.9 % IV SOLN
750.0000 mg | Freq: Two times a day (BID) | INTRAVENOUS | Status: DC
  Administered 2023-09-29 – 2023-10-01 (×4): 750 mg via INTRAVENOUS
  Filled 2023-09-29 (×6): qty 7.5

## 2023-09-29 NOTE — TOC Initial Note (Signed)
Transition of Care (TOC) - Initial/Assessment Note   Spoke to patient and daughter at bedside.   Confirmed face sheet information.   Patient from home with wife and daughter. Has an aide 4 hours a day, 4 days a week , hoyer lift  and wheelchair . Access to wheelchair Zenaida Niece for appointments. Will need ambulance transport home at discharge, confirmed address.   Patient currently on oxygen and does NOT have oxygen at home.  Patient Details  Name: Gilbert Wilson MRN: [id number] Date of Birth: 09-14-1945  Transition of [facility]) CM/SW Contact:    Kingsley Plan, RN Phone Number: 09/29/2023, 2:46 PM  Clinical Narrative:                   Expected Discharge Plan: Home/Self Care Barriers to Discharge: Continued Medical Work up   Patient Goals and CMS Choice            Expected Discharge Plan and Services   Discharge Planning Services: CM Consult Post Acute Care Choice: NA Living arrangements for the past 2 months: Single Family Home                 DME Arranged: N/A         HH Arranged: NA          Prior Living Arrangements/Services Living arrangements for the past 2 months: Single Family Home Lives with:: Adult Children, Spouse Patient language and need for interpreter reviewed:: Yes Do you feel safe going back to the place where you live?: Yes      Need for Family Participation in Patient Care: Yes (Comment) Care giver support system in place?: Yes (comment) Current home services: DME, Homehealth aide Criminal Activity/Legal Involvement Pertinent to Current Situation/Hospitalization: No - Comment as needed  Activities of Daily Living   ADL Screening (condition at time of admission) Independently performs ADLs?: No Does the patient have a NEW difficulty with bathing/dressing/toileting/self-feeding that is expected to last >3 days?: No (right side weakness from previous stroke) Does the patient have a NEW difficulty with getting in/out of bed, walking, or  climbing stairs that is expected to last >3 days?: No (right side weakness from previous stroke) Does the patient have a NEW difficulty with communication that is expected to last >3 days?: No (responds in one word or small phrases) Is the patient deaf or have difficulty hearing?: Yes Does the patient have difficulty seeing, even when wearing glasses/contacts?: No Does the patient have difficulty concentrating, remembering, or making decisions?: Yes  Permission Sought/Granted   Permission granted to share information with : Yes, Verbal Permission Granted  Share Information with NAME: daughter           Emotional Assessment Appearance:: Appears stated age       Alcohol / Substance Use: Not Applicable Psych Involvement: No (comment)  Admission diagnosis:  Influenza A [J10.1] SIRS (systemic inflammatory response syndrome) (HCC) [R65.10] Influenza A with pneumonia [J09.X1] Acute cystitis without hematuria [N30.00] Altered mental status, unspecified altered mental status type [R41.82] Patient Active Problem List   Diagnosis Date Noted   Influenza A with pneumonia 09/28/2023   Acute encephalopathy 01/01/2022   Complicated UTI (urinary tract infection) 01/01/2022   Acute kidney injury superimposed on chronic kidney disease (HCC) 01/01/2022   History of CVA with residual deficit 01/01/2022   Dementia (HCC) 11/25/2021   Pyuria 11/25/2021   Type 2 diabetes mellitus (HCC) 11/24/2021   Stroke (HCC) 11/24/2021   Vitamin D deficiency 11/24/2021   Immunodeficiency,  unspecified (HCC) 11/24/2021   Seizure (HCC) 11/24/2021   Sensorineural hearing loss 11/24/2021   Posttraumatic stress disorder 11/24/2021   Obstructive sleep apnea of adult 11/24/2021   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/24/2021   History of renal transplant 11/24/2021   Liver transplant status (HCC) 11/24/2021   Hemiplegia, unspecified affecting  unspecified side (HCC) 11/24/2021   Essential hypertension 11/24/2021   Dysphagia, unspecified 11/24/2021   Chronic hepatitis C (HCC) 11/24/2021   Cirrhosis of liver (HCC) 11/24/2021   Cerebellar stroke syndrome 11/24/2021   Cardiomegaly 11/24/2021   Beta thalassemia trait 11/24/2021   Benign prostatic hyperplasia with lower urinary tract symptoms 11/24/2021   Atrial fibrillation (HCC) 11/24/2021   Liver transplant recipient [facility]) 11/24/2021   Acute CVA (cerebrovascular accident) (HCC) 11/24/2021   Bradycardia 07/04/2021   Anemia 05/21/2009   PCP:  [facility]:   [facility] [phone] - [place], [place] - [address] [place] [address] Phone: [phone] Fax: [phone]  [facility] - [place], [place] - [address] [place] [address] Phone: [phone] Fax: [phone]     Social Drivers of Health (SDOH) Social History: SDOH Screenings   Food Insecurity: No Food Insecurity (09/28/2023)  Housing: Low Risk  (09/28/2023)  Transportation Needs: No Transportation Needs (09/28/2023)  Utilities: Not At Risk (09/28/2023)  Social Connections: Socially Isolated (09/28/2023)  Tobacco Use: Medium Risk (09/28/2023)   SDOH Interventions:     Readmission Risk Interventions    11/27/2021   10:20 AM  Readmission Risk Prevention Plan  Transportation Screening Complete  PCP or Specialist Appt within 3-5 Days Complete  HRI or Home Care Consult Complete  Social Work Consult for Recovery Care Planning/Counseling Complete  Palliative Care Screening Not Applicable  Medication Review Oceanographer) Complete

## 2023-09-29 NOTE — Inpatient Diabetes Management (Signed)
Inpatient Diabetes Program Recommendations  AACE/ADA: New Consensus Statement on Inpatient Glycemic Control (2015)  Target Ranges:  Prepandial:   less than 140 mg/dL      Peak postprandial:   less than 180 mg/dL (1-2 hours)      Critically ill patients:  140 - 180 mg/dL   Lab Results  Component Value Date   GLUCAP 88 09/29/2023   HGBA1C 6.4 (H) 09/28/2023    Review of Glycemic Control  Latest Reference Range & Units 09/29/23 08:33 09/29/23 11:18  Glucose-Capillary 70 - 99 mg/dL 90 88  Diabetes history: DM2 Outpatient Diabetes medications: Lantus 0-6 units at HS (BS>130 mg/dl, give 6 units) Current orders for Inpatient glycemic control: Novolog 0-15 units correction scale TID  Inpatient Diabetes Program Recommendations:   Note hx. of ESRD. CBG's <100 mg/dL. Please consider reducing Novolog correction to 0-6 units tid with meals. Thanks,   Lorenza Cambridge, RN, BC-ADM Inpatient Diabetes Coordinator Pager [phone]  (8a-5p)

## 2023-09-29 NOTE — Progress Notes (Signed)
OT Cancellation Note  Patient Details Name: Gilbert Wilson MRN: [id number] DOB: [birth year]   Cancelled Treatment:    Reason Eval/Treat Not Completed: (P) OT screened, no needs identified, will sign off, Pt at baseline, total A for all ADLs/mobility, spoke with family/caregiver at bedside, all needs met, plan is to go home and continue care with [facility] aide and family assisting as needed. No acute OT needs  Alexis Goodell 09/29/2023, 11:53 AM

## 2023-09-29 NOTE — Progress Notes (Signed)
MEWS Progress Note  Patient Details Name: Gilbert Wilson MRN: [id number] DOB: 20-Oct-1945 Today's Date: 09/29/2023   MEWS Flowsheet Documentation:  Assess: MEWS Score Temp: (!) 100.6 F (38.1 C) BP: 130/65 MAP (mmHg): 81 Pulse Rate: 94 ECG Heart Rate: 79 Resp: (!) 28 Level of Consciousness: Alert SpO2: 96 % O2 Device: Nasal Cannula O2 Flow Rate (L/min): 2 L/min Assess: MEWS Score MEWS Temp: 1 MEWS Systolic: 0 MEWS Pulse: 0 MEWS RR: 2 MEWS LOC: 0 MEWS Score: 3 MEWS Score Color: Yellow Assess: SIRS CRITERIA SIRS Temperature : 0 SIRS Respirations : 1 SIRS Pulse: 1 SIRS WBC: 0 SIRS Score Sum : 2 SIRS Temperature : 0 SIRS Pulse: 1 SIRS Respirations : 1 SIRS WBC: 0 SIRS Score Sum : 2 Assess: if the MEWS score is Yellow or Red Were vital signs accurate and taken at a resting state?: Yes Does the patient meet 2 or more of the SIRS criteria?: Yes Does the patient have a confirmed or suspected source of infection?: Yes MEWS guidelines implemented : Yes, red Treat MEWS Interventions: Considered administering scheduled or prn medications/treatments as ordered Take Vital Signs Increase Vital Sign Frequency : Red: Q1hr x2, continue Q4hrs until patient remains green for 12hrs Escalate MEWS: Escalate: Red: Discuss with charge nurse and notify provider. Consider notifying RRT. If remains red for 2 hours consider need for higher level of care        [name] K [name] 09/29/2023, 8:03 PM

## 2023-09-29 NOTE — Evaluation (Addendum)
Physical Therapy Evaluation and Discharge Patient Details Name: Gilbert Wilson MRN: [id number] DOB: 04/28/1946 Today's Date: 09/29/2023  History of Present Illness  78 y/o male presenting to [facility] ED on 09/28/2023 with chief complaints of SOB, cough, fever , and one-time vomiting. Found to have acute hypoxic respiratory failure secondary to influenza A viral PNA. Patient was recently diagnosed with UTI. PMH includes: dementia, HTN, IDDM T2., A fib s/p ablation, CVA w/ right sided hemiplegia, ESRD s/p renal transplant, liver failure s/p liver transplant in 2016, seizure disorder.  Clinical Impression  PTA, pt lives with his daughter and spouse and is dependent for ADL's and mobility. Pt has a HH aide 4 days/wk, 4 hours/day who performs hoyer lift transfers out of bed to wheelchair for ~2 hours/day. Pt uses wheelchair accessible van through [place] for appointments. Pt appears to be lethargic and follows a few commands. Requiring maximal assist for rolling to R/L for peri care and pad change. Repositioned in neutral with pillow prop for pressure relief. Recommend air mattress bed for optimal repositioning and pressure relief as pt is total care. No further acute or follow up PT recommended. Pt has adequate assist at home. Thank you for this consult.       If plan is discharge home, recommend the following: Assistance with feeding;Assistance with cooking/housework;Assist for transportation;A lot of help with walking and/or transfers;A lot of help with bathing/dressing/bathroom   Can travel by private vehicle        Equipment Recommendations None recommended by PT  Recommendations for Other Services       Functional Status Assessment Patient has had a recent decline in their functional status and/or demonstrates limited ability to make significant improvements in function in a reasonable and predictable amount of time     Precautions / Restrictions Precautions Precautions: Fall;Other  (comment) Precaution Comments: R hemiplegia (baseline) Restrictions Weight Bearing Restrictions Per Provider Order: No      Mobility  Bed Mobility Overal bed mobility: Needs Assistance Bed Mobility: Rolling Rolling: Max assist, +2 for physical assistance         General bed mobility comments: Pt able to bend L knee to facilitate transfer rolling to R. Increased assist rolling to L    Transfers                   General transfer comment: deferred    Ambulation/Gait                  Stairs            Wheelchair Mobility     Tilt Bed    Modified Rankin (Stroke Patients Only)       Balance                                             Pertinent Vitals/Pain Pain Assessment Pain Assessment: Faces Faces Pain Scale: Hurts a little bit Pain Location: grimacing with R shoulder ROM Pain Descriptors / Indicators: Grimacing Pain Intervention(s): Monitored during session, Limited activity within patient's tolerance    Home Living Family/patient expects to be discharged to:: Private residence Living Arrangements: Spouse/significant other;Other relatives (daughter and mother) Available Help at Discharge: Family;Available 24 hours/day Type of Home: House Home Access: Ramped entrance       Home Layout: One level Home Equipment: Other (comment);Hospital bed;Wheelchair - manual (tilt in space w/c, hoyer lift,  air mattress bed) Additional Comments: HH aide 4 days/wk, 4 hours/day. [facility] with w/c accessible Zenaida Niece    Prior Function Prior Level of Function : Needs assist             Mobility Comments: Hoyer lift to w/c, 4 days/wk, at least 2 hours/day ADLs Comments: Dependent ADL's     Extremity/Trunk Assessment   Upper Extremity Assessment Upper Extremity Assessment: RUE deficits/detail;LUE deficits/detail RUE Deficits / Details: Hx hemiplegia. Shoulder flexion PROM to 90 deg LUE Deficits / Details: Shoulder  flexion AROM to ~100 degrees    Lower Extremity Assessment Lower Extremity Assessment: RLE deficits/detail;LLE deficits/detail RLE Deficits / Details: Hx hemiplegia; PROM WFL LLE Deficits / Details: Able to move spontaneously against graivty       Communication   Communication Communication: No apparent difficulties  Cognition Arousal: Lethargic Behavior During Therapy: WFL for tasks assessed/performed Overall Cognitive Status: History of cognitive impairments - at baseline                                 General Comments: Pt able to follow a few one step commands, lethargic but arouses to loud name call        General Comments      Exercises General Exercises - Lower Extremity Heel Slides: PROM, Right, 5 reps, Supine Hip ABduction/ADduction: PROM, Right, 5 reps, Supine   Assessment/Plan    PT Assessment Patient does not need any further PT services  PT Problem List         PT Treatment Interventions      PT Goals (Current goals can be found in the Care Plan section)  Acute Rehab PT Goals Patient Stated Goal: pt daughter would like him to go home PT Goal Formulation: All assessment and education complete, DC therapy    Frequency       Co-evaluation               AM-PAC PT "6 Clicks" Mobility  Outcome Measure Help needed turning from your back to your side while in a flat bed without using bedrails?: Total Help needed moving from lying on your back to sitting on the side of a flat bed without using bedrails?: Total Help needed moving to and from a bed to a chair (including a wheelchair)?: Total Help needed standing up from a chair using your arms (e.g., wheelchair or bedside chair)?: Total Help needed to walk in hospital room?: Total Help needed climbing 3-5 steps with a railing? : Total 6 Click Score: 6    End of Session Equipment Utilized During Treatment: Oxygen Activity Tolerance: Patient tolerated treatment well Patient left: in  bed;with call bell/phone within reach;with bed alarm set;with family/visitor present   PT Visit Diagnosis: Other abnormalities of gait and mobility (R26.89)    Time: 6045-4098 PT Time Calculation (min) (ACUTE ONLY): 29 min   Charges:   PT Evaluation $PT Eval Moderate Complexity: 1 Mod PT Treatments $Therapeutic Activity: 8-22 mins PT General Charges $$ ACUTE PT VISIT: 1 Visit         Lillia Pauls, PT, DPT Acute Rehabilitation Services Office [phone]   Norval Morton 09/29/2023, 1:46 PM

## 2023-09-29 NOTE — Plan of Care (Signed)

## 2023-09-29 NOTE — Evaluation (Signed)
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 male with PMH: CVA with right sided hemiplegia, ESRD s/p renal transplant 2016, chronic hep C with cirrhosis, liver failure s/p liver transplant 2016, dementia, seizure disorder. DM-2, HTN, a-fib. He presented to the hospital on 09/28/23 from home with SOB and cough over past two days, fever and one instance of vomiting night before admission. He was recently diagnosed with UTI. In ED, Tmax was 102.3, CXR showing low lung volumes with chronic elevation of right hemidiaphragm, streaky opacities at the medial right lung base could represent atelectasis or infiltrate. Patient was lethargic and not able to follow commands, prompting RN to hold all PO medications and request SLP swallow evaluation.    Assessment / Plan / Recommendation  Clinical Impression  Patient is presenting with clinical s/s of dysphagia as per this bedside swallow evaluation. He has a h/o dysphagia secondary to prior CVA's and dementia. Per daughter who was in the room, at home she cuts up his food into very small pieces and carefully feeds him following previously learned swallow  precautions. She did also mention that prior to this admission,a "swallow test" was recommended by his MD (this is likely a MBS) and was not able to be scheduled yet and so request is for it to be completed during current hospitalization. Patient appeared lethargic but was able to maintain short periods of alertness. SLP performed oral care which included removing and cleaning his dentures. His breathing appeared short and per daughter is more rapid than his baseline. (SpO2 and RR appeared [facility]) He sounds wet and congested and cued cough was only effective to mobilize secretions minimally. SLP administered spoon sips of water; patient unable to form lips around spoon and exhibited delayed oral transit and suspected delayed swallow initiation. Instances of cough occured s/p spoon sips, however overall, did not appear significantly different from his baseline congestion. At this time, patient is a very high risk of aspiration given his current state of lethargy, inability to manage/clear secretions, h/o CVA with dysphagia. SLP is recommending NPO status but allow necessary medications. SLP will follow for ability at advancing to PO's. SLP Visit Diagnosis: Dysphagia, unspecified (R13.10)    Aspiration Risk  Severe aspiration risk;Risk for inadequate nutrition/hydration    Diet Recommendation NPO;Other (Comment) (NPO except necessary meds)    Medication Administration: Crushed with puree Postural Changes: Seated upright at 90 degrees    Other  Recommendations Oral Care Recommendations: Oral care QID;Oral care before and after PO;Staff/trained caregiver to provide oral care    Recommendations for follow up therapy are one component  of a multi-disciplinary discharge planning process, led by the attending physician.  Recommendations may be updated based on patient status, additional functional criteria and insurance authorization.  Follow up Recommendations Other (comment) (TBD)      Assistance  Recommended at Discharge    Functional Status Assessment Patient has had a recent decline in their functional status and demonstrates the ability to make significant improvements in function in a reasonable and predictable amount of time.  Frequency and Duration min 2x/week  2 weeks       Prognosis Prognosis for improved oropharyngeal function: Fair Barriers to Reach Goals: Severity of deficits;Cognitive deficits      Swallow Study   General Date of Onset: 09/28/23 HPI: Patient is a 78 y.o. male with PMH: CVA with right sided hemiplegia, ESRD s/p renal transplant 2016, chronic hep C with cirrhosis, liver failure s/p liver transplant 2016, dementia, seizure disorder. DM-2, HTN, a-fib. He presented to the hospital on 09/28/23 from home with SOB and cough over past two days, fever and one instance of vomiting night before admission. He was recently diagnosed with UTI. In ED, Tmax was 102.3, CXR showing low lung volumes with chronic elevation of right hemidiaphragm, streaky opacities at the medial right lung base could represent atelectasis or infiltrate. Patient was lethargic and not able to follow commands, prompting RN to hold all PO medications and request SLP swallow evaluation. Type of Study: Bedside Swallow Evaluation Previous Swallow Assessment: March of 2023 BSE Diet Prior to this Study: Regular;Thin liquids (Level 0) Temperature Spikes Noted: No Respiratory Status: Nasal cannula History of Recent Intubation: No Behavior/Cognition: Alert;Lethargic/Drowsy;Requires cueing;Confused Oral Cavity Assessment: Dried secretions Oral Care Completed by SLP: Yes Oral Cavity - Dentition: Dentures, top;Dentures, bottom Vision: Functional for self-feeding Self-Feeding Abilities: Total assist Patient Positioning: Upright in bed Baseline Vocal Quality: Not observed Volitional Cough: Congested;Weak;Wet Volitional Swallow: Unable to elicit    Oral/Motor/Sensory Function Overall Oral Motor/Sensory  Function: Moderate impairment Facial ROM: Reduced right Facial Symmetry: Abnormal symmetry right Facial Strength: Reduced right Lingual ROM: Reduced right;Reduced left Lingual Strength: Reduced   Ice Chips     Thin Liquid Thin Liquid: Impaired Presentation: Spoon;Straw Oral Phase Impairments: Reduced labial seal;Poor awareness of bolus;Reduced lingual movement/coordination Oral Phase Functional Implications: Prolonged oral transit;Oral holding Pharyngeal  Phase Impairments: Suspected delayed Swallow    Nectar Thick     Honey Thick     Puree Puree: Not tested   Solid     Solid: Not tested      Angela Nevin, MA, CCC-SLP Speech Therapy

## 2023-09-29 NOTE — Progress Notes (Signed)
PROGRESS NOTE  Gilbert Wilson  DOB: Mar 26, 1946  PCP: [facility] WUX:[id number]  DOA: 09/28/2023  LOS: 1 day  Hospital Day: 2  Brief narrative: Gilbert Wilson is a 78 y.o. male with PMH significant for dementia, DM2, HTN, A-fib s/p ablation, CVA with right-sided hemiplegia, ESRD s/p renal transplant in 2016, chronic hepatitis C with cirrhosis, liver failure s/p liver transplant in 2016, seizure disorder, and BPH. At baseline, patient lives at home with his family.  Bedbound, Hoyer lift used to transfer.  Taken care of by family.  Minimal conversation, able to verbalize his needs.  1/27, patient presented to the ED from home with complaint of shortness of breath, cough, fever, vomiting for last 2 days.  Recently diagnosed with UTI and treated with amoxicillin in the [place].  In the ED, patient had a fever up to 102.3, tachycardic to low 100s, tachypneic to 30s, blood pressure in 140s mostly, required 2 to 3 L oxygen by nasal cannula Initial labs with WBC count 8.9, hemoglobin 10.2, lactic acid level normal, procalcitonin level slightly elevated 0.17, Respiratory virus panel positive for influenza A Urinalysis with hazy yellow urine, large leukocytes, negative nitrite, rare bacteria Blood culture and urine culture were collected Chest x-ray with atelectasis and no clear infiltrates  Patient was started on Tamiflu, IV fluid Admitted to [facility]   Subjective: Patient was seen and examined this morning.  Pleasant elderly African-American male.  Propped up in bed.  Dozing off.  Patient is demented and unable to have a intermittent conversation.  Daughter was at bedside with help with most of the history. Failed bedside swallow eval this morning Overnight, patient had recurrent fever up to 102, blood pressure in 140s and 150s Labs from this morning with WBC normal, hemoglobin lower at 8.7, CK level elevated to 1700s, BUN/creatinine 47/2.36  Assessment and plan: Sepsis POA Influenza  A Presented with fever, cough, shortness of breath, altered mental status Flu positive. Chest x-ray without clear infiltrates and procalcitonin slightly elevated Currently on a 5-day course of Tamiflu. I do not think there is a bacterial infection to cover with IV antibiotics. Tylenol for fever Recent Labs  Lab 09/28/23 0907 09/28/23 0928 09/29/23 0632  WBC 8.9  --  8.4  LATICACIDVEN  --  1.7  --   PROCALCITON 0.17  --   --    Acute on chronic dysphagia Patient has chronic dysphagia related to past strokes.  Per daughter, he is able to chew chopped foods On speech evaluation this morning, patient failed.  Partially due to acute illness. Discussed with the speech therapist and daughter at bedside. For now, we will keep him n.p.o. except for some essential oral meds Expect swelling to improve in next 1 to 2 days. Full code.  Acute respiratory failure with hypoxia Not on supplemental oxygen at home.   Was tachypneic in the ED and required 2 to 3 L oxygen PRN oxygen DuoNeb as needed Continue aspiration precautions.   Speech eval ordered  Rhabdomyolysis CK level elevated to over 1700.  Has chronic immobility. Per pharmacist, Crestor and cyclosporine can interact and cause myositis. Hold Crestor.  Repeat CK.  Once CK normalizes, probably can replace with pravastatin. Recent Labs  Lab 09/29/23 2536  CKTOTAL 1,708*   Recent h/o UTI Was on a course of amoxicillin as an outpatient. Urinalysis not impressive.  Type 2 diabetes mellitus A1c 6.4 on 09/28/2023 PTA meds-Lantus nightly up to 6 units Currently on SSI/Accu-Cheks.  Continue to monitor blood sugar and adjust  accordingly. Recent Labs  Lab 09/28/23 1846 09/28/23 2015 09/29/23 0833 09/29/23 1118  GLUCAP 129* 120* 90 88   Essential hypertension PTA meds- amlodipine 10 mg daily, losartan 50 mg twice daily, hydralazine 50 mg 3 times daily, Lasix 20 mg every other day Blood pressure in 140s and 150s this morning. All  oral meds on hold at this time because of n.p.o. status. IV hydralazine as needed  A.fib s/p ablation currently in sinus rhythm Patient's not on AV nodal blocker Monitor on telemetry   Seizure disorder PTA meds- Keppra 750 mg twice daily Continue Keppra as IV Continue seizure precautions, use Ativan as needed   H/o CVA with residual right-sided hemiplegia HLD PTA meds- Plavix and Crestor. 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  Resume when able to take oral intake Immunosuppressants as above Recent Labs    04/29/23 1539 05/26/23 1422 09/28/23 0907 09/29/23 0632  BUN 48* 36* 48* 47*  CREATININE 2.44* 2.44* 2.43* 2.36*  CO2 18* 21* 20* 19*   Chronic microcytic anemia Chronic iron deficiency Resume when iron supplement when able Recent Labs    04/29/23 1539 05/26/23 1422 09/28/23 0907 09/29/23 0632  HGB 8.4* 9.1* 10.2* 8.7*  MCV 78.1* 78.9* 78.2* 75.8*  VITAMINB12  --   --   --  380  FOLATE  --   --   --  13.8  TIBC  --   --   --  176*  IRON  --   --   --  7*   Dementia patient is AO x 1 to 2 at baseline PTA meds- Remeron 7.5 mg nightly.  Resume when able Continue supportive care   BPH PTA meds- Proscar 5 mg, Flomax 0.4 mg nightly Resume when able    Mobility: Bedbound for last few years  Goals of care   Code Status: Full Code.  Discussed with daughter at bedside today.    DVT prophylaxis:  heparin injection 5,000 Units Start: 09/28/23 1400   Antimicrobials: Tamiflu Fluid: Start NS at 75 mL/h while NPO Consultants: None Family Communication: Daughter at bedside  Status: Inpatient Level of care:  Med-Surg    Patient is from: Home Needs to continue in-hospital care: Treatment of flu, dysphagia.  Needs IV fluid Anticipated d/c to: Home in 1 to 2 days      Diet:  Diet Order             Diet NPO time specified  Diet effective now                   Scheduled Meds:  cycloSPORINE modified  150 mg Oral BID   guaiFENesin  600 mg Oral BID   heparin  5,000 Units Subcutaneous Q8H   insulin aspart  0-9 Units Subcutaneous Q4H   mycophenolate  180 mg Oral BID   oseltamivir  30 mg Oral BID   sodium chloride flush  3 mL Intravenous Q12H    PRN meds: sodium chloride, acetaminophen **OR** acetaminophen, bisacodyl, chlorpheniramine-HYDROcodone, hydrALAZINE, ipratropium-albuterol, LORazepam, ondansetron **OR** ondansetron (ZOFRAN) IV, polyethylene glycol, sodium chloride flush   Infusions:   sodium chloride     cefTRIAXone (ROCEPHIN)  IV     levETIRAcetam      Antimicrobials: Anti-infectives (From admission, onward)    Start     Dose/Rate Route Frequency Ordered Stop   09/29/23 1000  cefTRIAXone (ROCEPHIN) 1 g in sodium chloride 0.9 % 100 mL IVPB        1 g 200 mL/hr over 30 Minutes Intravenous Every 24 hours 09/28/23 1321     09/28/23 2200  oseltamivir (TAMIFLU) capsule 30 mg       Placed in "Followed by" Linked Group   30 mg Oral 2 times daily 09/28/23 1242 10/03/23 2159   09/28/23 1245  oseltamivir (TAMIFLU) capsule 75 mg       Placed in "Followed by" Linked Group   75 mg Oral  Once 09/28/23 1242 09/29/23 1149   09/28/23 0945  vancomycin (VANCOREADY) IVPB 1500 mg/300 mL        1,500 mg 150 mL/hr over 120 Minutes Intravenous  Once 09/28/23 0936 09/28/23 1253   09/28/23 0930  ceFEPIme (MAXIPIME) 2 g in sodium chloride 0.9 % 100 mL IVPB        2 g 200 mL/hr over 30 Minutes Intravenous  Once 09/28/23 0924 09/28/23 1034   09/28/23 0930  metroNIDAZOLE (FLAGYL) IVPB 500 mg        500 mg 100 mL/hr over 60 Minutes Intravenous  Once 09/28/23 0924 09/28/23 1228   09/28/23 0930   vancomycin (VANCOCIN) IVPB 1000 mg/200 mL premix  Status:  Discontinued        1,000 mg 200 mL/hr over 60 Minutes Intravenous  Once 09/28/23 0924 09/28/23 0936       Objective: Vitals:   09/29/23 1130 09/29/23 1148  BP: (!) 152/58 (!) 152/58  Pulse:    Resp:    Temp:    SpO2:      Intake/Output Summary (Last 24 hours) at 09/29/2023 1315 Last data filed at 09/29/2023 0300 Gross per 24 hour  Intake --  Output 300 ml  Net -300 ml   Filed Weights   09/28/23 0918 09/28/23 2004  Weight: 83.9 kg 96.9 kg   Weight change:  Body mass index is 28.18 kg/m.   Physical Exam: General exam: Pleasant, elderly African-American demented male.  Not in physical distress Skin: No rashes, lesions or ulcers. HEENT: Atraumatic, normocephalic, no obvious bleeding Lungs: Clear to auscultation bilaterally,  CVS: S1, S2, no murmur,   GI/Abd: Soft, nontender, nondistended, bowel sound present,   CNS: Somnolent, dozing off.  Demented at baseline. Psychiatry: Sad affect Extremities: No pedal edema, no calf tenderness,   Data Review: I have personally reviewed the laboratory data and studies available.  F/u labs ordered Unresulted Labs (From admission, onward)     Start     Ordered   09/30/23 0500  Basic metabolic panel  Tomorrow morning,   R        09/29/23 1308   09/30/23 0500  CBC with Differential/Platelet  Tomorrow morning,   R        09/29/23 1308   09/30/23 0500  CK  Tomorrow morning,   R        09/29/23 1308   09/29/23 1106  Respiratory (~20 pathogens) panel by PCR  (Respiratory panel by PCR (~20 pathogens, ~24 hr TAT)  w precautions)  Once,   R        09/29/23 1105            Total time spent in review of labs and imaging, patient evaluation, formulation of plan,  documentation and communication with family: 55 minutes  Signed, Lorin Glass, MD [facility] 09/29/2023

## 2023-09-30 ENCOUNTER — Inpatient Hospital Stay (HOSPITAL_COMMUNITY): Payer: No Typology Code available for payment source

## 2023-09-30 DIAGNOSIS — J9601 Acute respiratory failure with hypoxia: Secondary | ICD-10-CM | POA: Diagnosis not present

## 2023-09-30 DIAGNOSIS — N184 Chronic kidney disease, stage 4 (severe): Secondary | ICD-10-CM

## 2023-09-30 DIAGNOSIS — A419 Sepsis, unspecified organism: Secondary | ICD-10-CM

## 2023-09-30 DIAGNOSIS — J09X1 Influenza due to identified novel influenza A virus with pneumonia: Secondary | ICD-10-CM | POA: Diagnosis not present

## 2023-09-30 DIAGNOSIS — E872 Acidosis, unspecified: Secondary | ICD-10-CM

## 2023-09-30 DIAGNOSIS — E870 Hyperosmolality and hypernatremia: Secondary | ICD-10-CM

## 2023-09-30 LAB — CBC WITH DIFFERENTIAL/PLATELET
Abs Immature Granulocytes: 0.05 10*3/uL (ref 0.00–0.07)
Basophils Absolute: 0 10*3/uL (ref 0.0–0.1)
Basophils Relative: 0 %
Eosinophils Absolute: 0 10*3/uL (ref 0.0–0.5)
Eosinophils Relative: 0 %
HCT: 27.9 % — ABNORMAL LOW (ref 39.0–52.0)
Hemoglobin: 8.6 g/dL — ABNORMAL LOW (ref 13.0–17.0)
Immature Granulocytes: 1 %
Lymphocytes Relative: 11 %
Lymphs Abs: 0.9 10*3/uL (ref 0.7–4.0)
MCH: 24.3 pg — ABNORMAL LOW (ref 26.0–34.0)
MCHC: 30.8 g/dL (ref 30.0–36.0)
MCV: 78.8 fL — ABNORMAL LOW (ref 80.0–100.0)
Monocytes Absolute: 0.7 10*3/uL (ref 0.1–1.0)
Monocytes Relative: 8 %
Neutro Abs: 6.8 10*3/uL (ref 1.7–7.7)
Neutrophils Relative %: 80 %
Platelets: 117 10*3/uL — ABNORMAL LOW (ref 150–400)
RBC: 3.54 MIL/uL — ABNORMAL LOW (ref 4.22–5.81)
RDW: 16.5 % — ABNORMAL HIGH (ref 11.5–15.5)
WBC: 8.4 10*3/uL (ref 4.0–10.5)
nRBC: 0 % (ref 0.0–0.2)

## 2023-09-30 LAB — PROCALCITONIN: Procalcitonin: 4.47 ng/mL

## 2023-09-30 LAB — GLUCOSE, CAPILLARY
Glucose-Capillary: 120 mg/dL — ABNORMAL HIGH (ref 70–99)
Glucose-Capillary: 97 mg/dL (ref 70–99)
Glucose-Capillary: 87 mg/dL (ref 70–99)
Glucose-Capillary: 73 mg/dL (ref 70–99)
Glucose-Capillary: 77 mg/dL (ref 70–99)

## 2023-09-30 LAB — CK: Total CK: 1120 U/L — ABNORMAL HIGH (ref 49–397)

## 2023-09-30 LAB — BASIC METABOLIC PANEL
Anion gap: 15 (ref 5–15)
BUN: 48 mg/dL — ABNORMAL HIGH (ref 8–23)
CO2: 14 mmol/L — ABNORMAL LOW (ref 22–32)
Calcium: 9.1 mg/dL (ref 8.9–10.3)
Chloride: 117 mmol/L — ABNORMAL HIGH (ref 98–111)
Creatinine, Ser: 2.49 mg/dL — ABNORMAL HIGH (ref 0.61–1.24)
GFR, Estimated: 26 mL/min — ABNORMAL LOW (ref >60)
Glucose, Bld: 108 mg/dL — ABNORMAL HIGH (ref 70–99)
Potassium: 4.2 mmol/L (ref 3.5–5.1)
Sodium: 146 mmol/L — ABNORMAL HIGH (ref 135–145)

## 2023-09-30 MED ORDER — GUAIFENESIN ER 600 MG PO TB12
1200.0000 mg | ORAL_TABLET | Freq: Two times a day (BID) | ORAL | Status: DC
  Administered 2023-09-30 – 2023-10-04 (×8): 1200 mg via ORAL
  Filled 2023-09-30 (×8): qty 2

## 2023-09-30 MED ORDER — INSULIN ASPART 100 UNIT/ML IJ SOLN
0.0000 [IU] | INTRAMUSCULAR | Status: DC
  Administered 2023-10-02 – 2023-10-03 (×3): 1 [IU] via SUBCUTANEOUS

## 2023-09-30 MED ORDER — SODIUM CHLORIDE 0.45 % IV SOLN
INTRAVENOUS | Status: DC
  Filled 2023-09-30 (×2): qty 75

## 2023-09-30 NOTE — Hospital Course (Addendum)
Gilbert Wilson is a 78 y.o. male with a history of hypertension, insulin-dependent diabetes mellitus type 2, atrial fibrillation status post ablation, CVA with right-sided hemiplegia, ESRD s/p renal transplant, CKD, chronic hepatitis C with cirrhosis, liver failure status post liver transplant, dementia, seizure disorder, BPH.  Patient presented secondary to shortness of breath and fever and was found to have evidence of sepsis secondary to influenza A infection.  Patient was also diagnosed with UTI and was treated with ceftriaxone.  Hospitalization complicated by associated acute metabolic encephalopathy, acute respiratory failure with hypoxia secondary to acute illness and hypernatremia.  Started for treatment of influenza A infection.

## 2023-09-30 NOTE — Progress Notes (Signed)
SLP Cancellation Note  Patient Details Name: Gilbert Wilson MRN: [id number] DOB: 08-Feb-1946   Cancelled treatment:       Reason Eval/Treat Not Completed: Patient's level of consciousness Per RN, patient continues to be congested and wet sounding, had difficulty with PO meds. SLP checked on patient in later afternoon and he was sleep. Will return next date.   Angela Nevin, MA, CCC-SLP Speech Therapy

## 2023-09-30 NOTE — Progress Notes (Signed)
PROGRESS NOTE    Gilbert Wilson  WUJ:[id number] DOB: [birth year] DOA: 09/28/2023 PCP: [facility]   Brief Narrative: Gilbert Wilson is a 78 y.o. male with a history of hypertension, insulin-dependent diabetes mellitus type 2, atrial fibrillation status post ablation, CVA with right-sided hemiplegia, ESRD s/p renal transplant, CKD, chronic hepatitis C with cirrhosis, liver failure status post liver transplant, dementia, seizure disorder, BPH.  Patient presented secondary to shortness of breath and fever and was found to have evidence of sepsis secondary to influenza A infection.  Patient was also diagnosed with UTI and was treated with ceftriaxone.  Hospitalization complicated by associated acute metabolic encephalopathy and acute respiratory failure with hypoxia secondary to acute illness.  Started for treatment of influenza A infection.   Assessment and Plan:  Sepsis Present on admission. Secondary to influenza A infection. Blood cultures with no growth to date.  Influenza A infection No clear infiltrates on chest imaging. Patient started on Tamiflu -Continue Tamiflu (renally dosed)  Acute metabolic encephalopathy Secondary to infection.  Complicated by underlying dementia  Acute respiratory failure with hypoxia Associated tachypnea. Secondary to acute illness. Initial x-ray without overt infectious etiology. -Wean to room air -Repeat chest x-ray  Dysphagia Speech therapy consulted. Current recommendations for NPO except for meds. -Continued SLP recommendations -If continues to not be safe for PO intake, will need to consider insertion of an NG tube  Rhabdomyolysis Unclear if traumatic vs nontraumatic. Patient is on Crestor which has interaction with cyclosporine; Crestor held. CK of 1,708 on admission. Improved to 1,120. 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 Currently stable.  Metabolic  acidosis In setting of underlying renal disease. Sodium bicarbonate tablets held on admission. -Sodium bicarb IV  Hypernatremia Associated hyperchloremia. Likely secondary to normal saline IV fluids and lack of oral intake. -Change to Sodium bicarb in 1/2 normal saline IV fluids -BMP in AM  Diabetes mellitus, type 2 Controlled. Hemoglobin A1C of 6.4%. Patient is currently NPO. -Continue SSI  Thrombocytopenia Acute. Likely reactive secondary to acute illness. -Trend CBC  Chronic microcytic anemia Likely, in part, related to chronic kidney disease in addition to likely iron deficiency anemia. -Consider iron supplementation once acute illness has improved  Primary hypertension Patient is on losartan, amlodipine and hydralazine as an outpatient. Amlodipine and hydralazine initially resumed, but now discontinued. Normotensive to slightly hypertensive.  Atrial fibrillation, unspecified Sinus rhythm. History of ablation. Patient is not on anticoagulation.    DVT prophylaxis: Heparin Subq Code Status:   Code Status: Full Code Family Communication: Daughter at bedside Disposition Plan: Discharge home likely not for 3-5 days pending improvement in mentation, ability to wean oxygen, ability to advance diet   Consultants:  None  Procedures:  None  Antimicrobials: Vancomycin Ceftriaxone Cefepime Flagyl Osteltamivir    Subjective: Per daughter, patient has been doing better with improved fevers. Somewhat interactive but still not at baselin.  Objective: BP 135/60 (BP Location: Left Arm)   Pulse 72   Temp 98.5 F (36.9 C) (Oral)   Resp 16   Ht 6\' 1"  (1.854 m)   Wt 94.2 kg   SpO2 97%   BMI 27.40 kg/m   Examination:  General exam: Appears calm and comfortable Respiratory system: Rhonchi, mostly transmitted from upper airway. Increased accessory muscle usage with mild tachypnea. Cardiovascular system: S1 & S2 heard, RRR. No murmurs, rubs, gallops or  clicks. Gastrointestinal system: Abdomen is nondistended, soft and nontender. Normal bowel sounds heard.  Central nervous system: Alert and oriented to person.    Data Reviewed: I have personally reviewed following labs and imaging studies  CBC Lab Results  Component Value Date   WBC 8.4 09/30/2023   RBC 3.54 (L) 09/30/2023   HGB 8.6 (L) 09/30/2023   HCT 27.9 (L) 09/30/2023   MCV 78.8 (L) 09/30/2023   MCH 24.3 (L) 09/30/2023   PLT 117 (L) 09/30/2023   MCHC 30.8 09/30/2023   RDW 16.5 (H) 09/30/2023   LYMPHSABS 0.9 09/30/2023   MONOABS 0.7 09/30/2023   EOSABS 0.0 09/30/2023   BASOSABS 0.0 09/30/2023     Last metabolic panel Lab Results  Component Value Date   NA 146 (H) 09/30/2023   K 4.2 09/30/2023   CL 117 (H) 09/30/2023   CO2 14 (L) 09/30/2023   BUN 48 (H) 09/30/2023   CREATININE 2.49 (H) 09/30/2023   GLUCOSE 108 (H) 09/30/2023   GFRNONAA 26 (L) 09/30/2023   CALCIUM 9.1 09/30/2023   PHOS 3.3 09/29/2023   PROT 7.1 09/28/2023   ALBUMIN 3.3 (L) 09/28/2023   BILITOT 0.6 09/28/2023   ALKPHOS 62 09/28/2023   AST 15 09/28/2023   ALT 12 09/28/2023   ANIONGAP 15 09/30/2023    GFR: Estimated Creatinine Clearance: 28.5 mL/min (A) (by C-G formula based on SCr of 2.49 mg/dL (H)).  Recent Results (from the past 240 hours)  Resp panel by RT-PCR (RSV, Flu A&B, Covid) Anterior Nasal Swab     Status: Abnormal   Collection Time: 09/28/23  8:59 AM   Specimen: Anterior Nasal Swab  Result Value Ref Range Status   SARS Coronavirus 2 by RT PCR NEGATIVE NEGATIVE Final   Influenza A by PCR POSITIVE (A) NEGATIVE Final   Influenza B by PCR NEGATIVE NEGATIVE Final    Comment: (NOTE) The Xpert Xpress SARS-CoV-2/FLU/RSV plus assay is intended as an aid in the diagnosis of influenza from Nasopharyngeal swab specimens and should not be used as a sole basis for treatment. Nasal washings and aspirates are unacceptable for Xpert Xpress SARS-CoV-2/FLU/RSV testing.  Fact Sheet for  Patients: BloggerCourse.com  Fact Sheet for Healthcare Providers: SeriousBroker.it  This test is not yet approved or cleared by the [place] FDA and has been authorized for detection and/or diagnosis of SARS-CoV-2 by FDA under an Emergency Use Authorization (EUA). This EUA will remain in effect (meaning this test can be used) for the duration of the COVID-19 declaration under Section 564(b)(1) of the Act, 21 U.S.C. section 360bbb-3(b)(1), unless the authorization is terminated or revoked.     Resp Syncytial Virus by PCR NEGATIVE NEGATIVE Final    Comment: (NOTE) Fact Sheet for Patients: BloggerCourse.com  Fact Sheet for Healthcare Providers: SeriousBroker.it  This test is not yet approved or cleared by the [place] FDA and has been authorized for detection and/or diagnosis of SARS-CoV-2 by FDA under an Emergency Use Authorization (EUA). This EUA will remain in effect (meaning this test can be used) for the duration of the COVID-19 declaration under Section 564(b)(1) of the Act, 21 U.S.C. section 360bbb-3(b)(1), unless the authorization is terminated or revoked.  Performed at [facility], [address], [place], [place] [address]   Blood Culture (routine x 2)     Status: None (Preliminary result)   Collection Time: 09/28/23  9:07 AM   Specimen: BLOOD LEFT ARM  Result Value Ref Range Status   Specimen Description BLOOD LEFT ARM  Final   Special Requests   Final    BOTTLES DRAWN  AEROBIC AND ANAEROBIC Blood Culture results may not be optimal due to an inadequate volume of blood received in culture bottles   Culture   Final    NO GROWTH 2 DAYS Performed at [facility], [address] [place], [place] [address]    Report Status PENDING  Incomplete  Blood Culture (routine x 2)     Status: None (Preliminary result)   Collection Time: 09/28/23  9:07 AM    Specimen: BLOOD LEFT ARM  Result Value Ref Range Status   Specimen Description BLOOD LEFT ARM  Final   Special Requests   Final    BOTTLES DRAWN AEROBIC AND ANAEROBIC Blood Culture results may not be optimal due to an inadequate volume of blood received in culture bottles   Culture   Final    NO GROWTH 2 DAYS Performed at [facility], [address] [place], [place] [address]    Report Status PENDING  Incomplete  Urine Culture     Status: None   Collection Time: 09/28/23 11:26 AM   Specimen: Urine, Random  Result Value Ref Range Status   Specimen Description URINE, RANDOM  Final   Special Requests NONE Reflexed from [id number]  Final   Culture   Final    NO GROWTH Performed at [facility], [address] [place], [place] [address]    Report Status 09/29/2023 FINAL  Final  Respiratory (~20 pathogens) panel by PCR     Status: Abnormal   Collection Time: 09/29/23 11:06 AM   Specimen: Nasopharyngeal Swab; Respiratory  Result Value Ref Range Status   Adenovirus NOT DETECTED NOT DETECTED Corrected   Coronavirus 229E NOT DETECTED NOT DETECTED Corrected    Comment: (NOTE) The Coronavirus on the Respiratory Panel, DOES NOT test for the novel  Coronavirus (2019 nCoV) CORRECTED ON 01/28 AT 2020: PREVIOUSLY REPORTED AS NOT DETECTED    Coronavirus HKU1 NOT DETECTED NOT DETECTED Corrected   Coronavirus NL63 NOT DETECTED NOT DETECTED Corrected   Coronavirus OC43 NOT DETECTED NOT DETECTED Corrected   Metapneumovirus NOT DETECTED NOT DETECTED Corrected   Rhinovirus / Enterovirus NOT DETECTED NOT DETECTED Corrected   Influenza A H3 DETECTED (A) NOT DETECTED Corrected   Influenza B NOT DETECTED NOT DETECTED Corrected   Parainfluenza Virus 1 NOT DETECTED NOT DETECTED Corrected   Parainfluenza Virus 2 NOT DETECTED NOT DETECTED Corrected   Parainfluenza Virus 3 NOT DETECTED NOT DETECTED Corrected   Parainfluenza Virus 4 NOT DETECTED NOT DETECTED Corrected   Respiratory Syncytial Virus  NOT DETECTED NOT DETECTED Corrected   Bordetella pertussis NOT DETECTED NOT DETECTED Corrected   Bordetella Parapertussis NOT DETECTED NOT DETECTED Corrected   Chlamydophila pneumoniae NOT DETECTED NOT DETECTED Corrected   Mycoplasma pneumoniae NOT DETECTED NOT DETECTED Corrected    Comment: Performed at [facility], [address], [place], [place] [address]      Radiology Studies: No results found.    LOS: 2 days    Jacquelin Hawking, MD [facility] 09/30/2023, 1:39 PM   If 7PM-7AM, please contact night-coverage [email or web address]

## 2023-10-01 ENCOUNTER — Inpatient Hospital Stay (HOSPITAL_COMMUNITY): Payer: No Typology Code available for payment source

## 2023-10-01 ENCOUNTER — Encounter (HOSPITAL_COMMUNITY): Payer: Self-pay | Admitting: Student

## 2023-10-01 DIAGNOSIS — E870 Hyperosmolality and hypernatremia: Secondary | ICD-10-CM | POA: Diagnosis not present

## 2023-10-01 DIAGNOSIS — N184 Chronic kidney disease, stage 4 (severe): Secondary | ICD-10-CM | POA: Diagnosis not present

## 2023-10-01 DIAGNOSIS — J9601 Acute respiratory failure with hypoxia: Secondary | ICD-10-CM | POA: Diagnosis not present

## 2023-10-01 DIAGNOSIS — J09X1 Influenza due to identified novel influenza A virus with pneumonia: Secondary | ICD-10-CM | POA: Diagnosis not present

## 2023-10-01 LAB — CBC
HCT: 30.8 % — ABNORMAL LOW (ref 39.0–52.0)
Hemoglobin: 9.6 g/dL — ABNORMAL LOW (ref 13.0–17.0)
MCH: 24.5 pg — ABNORMAL LOW (ref 26.0–34.0)
MCHC: 31.2 g/dL (ref 30.0–36.0)
MCV: 78.6 fL — ABNORMAL LOW (ref 80.0–100.0)
Platelets: 166 10*3/uL (ref 150–400)
RBC: 3.92 MIL/uL — ABNORMAL LOW (ref 4.22–5.81)
RDW: 16.7 % — ABNORMAL HIGH (ref 11.5–15.5)
WBC: 5.3 10*3/uL (ref 4.0–10.5)
nRBC: 0 % (ref 0.0–0.2)

## 2023-10-01 LAB — BASIC METABOLIC PANEL
Anion gap: 8 (ref 5–15)
BUN: 41 mg/dL — ABNORMAL HIGH (ref 8–23)
CO2: 17 mmol/L — ABNORMAL LOW (ref 22–32)
Calcium: 9.2 mg/dL (ref 8.9–10.3)
Chloride: 123 mmol/L — ABNORMAL HIGH (ref 98–111)
Creatinine, Ser: 2.13 mg/dL — ABNORMAL HIGH (ref 0.61–1.24)
GFR, Estimated: 31 mL/min — ABNORMAL LOW (ref >60)
Glucose, Bld: 67 mg/dL — ABNORMAL LOW (ref 70–99)
Potassium: 3.9 mmol/L (ref 3.5–5.1)
Sodium: 148 mmol/L — ABNORMAL HIGH (ref 135–145)

## 2023-10-01 LAB — GLUCOSE, CAPILLARY
Glucose-Capillary: 103 mg/dL — ABNORMAL HIGH (ref 70–99)
Glucose-Capillary: 103 mg/dL — ABNORMAL HIGH (ref 70–99)
Glucose-Capillary: 112 mg/dL — ABNORMAL HIGH (ref 70–99)
Glucose-Capillary: 71 mg/dL (ref 70–99)
Glucose-Capillary: 72 mg/dL (ref 70–99)
Glucose-Capillary: 76 mg/dL (ref 70–99)
Glucose-Capillary: 98 mg/dL (ref 70–99)

## 2023-10-01 LAB — CK: Total CK: 807 U/L — ABNORMAL HIGH (ref 49–397)

## 2023-10-01 MED ORDER — HYDRALAZINE HCL 50 MG PO TABS
50.0000 mg | ORAL_TABLET | Freq: Three times a day (TID) | ORAL | Status: DC
  Administered 2023-10-01 – 2023-10-04 (×9): 50 mg via ORAL
  Filled 2023-10-01 (×8): qty 1

## 2023-10-01 MED ORDER — LEVETIRACETAM 750 MG PO TABS
750.0000 mg | ORAL_TABLET | Freq: Two times a day (BID) | ORAL | Status: DC
  Administered 2023-10-01 – 2023-10-04 (×6): 750 mg via ORAL
  Filled 2023-10-01 (×6): qty 1

## 2023-10-01 MED ORDER — ENSURE ENLIVE PO LIQD
237.0000 mL | Freq: Two times a day (BID) | ORAL | Status: DC
  Administered 2023-10-02 – 2023-10-04 (×5): 237 mL via ORAL

## 2023-10-01 MED ORDER — AMLODIPINE BESYLATE 10 MG PO TABS
10.0000 mg | ORAL_TABLET | Freq: Every day | ORAL | Status: DC
  Administered 2023-10-01 – 2023-10-04 (×4): 10 mg via ORAL
  Filled 2023-10-01 (×4): qty 1

## 2023-10-01 MED ORDER — DEXTROSE 5 % IV SOLN: INTRAVENOUS | Status: AC

## 2023-10-01 MED ORDER — SODIUM BICARBONATE 650 MG PO TABS
1300.0000 mg | ORAL_TABLET | Freq: Two times a day (BID) | ORAL | Status: DC
  Administered 2023-10-01 – 2023-10-04 (×6): 1300 mg via ORAL
  Filled 2023-10-01 (×6): qty 2

## 2023-10-01 MED ORDER — TAMSULOSIN HCL 0.4 MG PO CAPS
0.4000 mg | ORAL_CAPSULE | Freq: Every day | ORAL | Status: DC
  Administered 2023-10-01 – 2023-10-04 (×4): 0.4 mg via ORAL
  Filled 2023-10-01 (×4): qty 1

## 2023-10-01 MED ORDER — SODIUM CHLORIDE 0.9 % IV SOLN
500.0000 mg | INTRAVENOUS | Status: DC
  Administered 2023-10-01: 500 mg via INTRAVENOUS
  Filled 2023-10-01: qty 5

## 2023-10-01 MED ORDER — AZITHROMYCIN 250 MG PO TABS
500.0000 mg | ORAL_TABLET | Freq: Every day | ORAL | Status: DC
  Administered 2023-10-02 – 2023-10-04 (×3): 500 mg via ORAL
  Filled 2023-10-01 (×3): qty 2

## 2023-10-01 NOTE — Procedures (Signed)
Modified Barium Swallow Study  Patient Details  Name: Gilbert Wilson MRN: [id number] Date of Birth: [birth year]  Today's Date: 10/01/2023  Modified Barium Swallow completed.  Full report located under Chart Review in the Imaging Section.  History of Present Illness Patient is a 78 y.o. male with PMH: CVA with right sided hemiplegia, ESRD s/p renal transplant 2016, chronic hep C with cirrhosis, liver failure s/p liver transplant 2016, dementia, seizure disorder. DM-2, HTN, a-fib. He presented to the hospital on 09/28/23 from home with SOB and cough over past two days, fever and one instance of vomiting night before admission. He was recently diagnosed with UTI. In ED, Tmax was 102.3, CXR showing low lung volumes with chronic elevation of right hemidiaphragm, streaky opacities at the medial right lung base could represent atelectasis or infiltrate. Patient was lethargic and not able to follow commands, prompting RN to hold all PO medications and request SLP swallow evaluation.   Clinical Impression Patient presents with an oropharyngeal dysphagia with silent aspiration occuring with thin liquid boluses secondary to mistiming of epiglottic inversion leading to delayed closer of laryngeal vestibule. During oral phase, patient with disorganized lingual movements resulting in delayed anterior to posterior transit of boluses. Swallow was initiated at level of pyriform sinus with barium still remaining in oral cavity when swallow initiated. Anterior hyoid excursion,, epiglottic inversion and laryngeal vestibule closure were all complete/WFL. During the majority of swallows, boluses fully cleared pharynx during initial swallow. Suspected cervical osteophytes (no radiologist present to confirm) did appear to result in trace to minimal amount of barium remaining above PES after initial swallows with liquid boluses. When aspiration occurred, it was with thin liquids and secondary to prolonged delay of swallow  initiation, such that barium started to enter open airway before swallow was initiated. Aspiration was sensed at times (PAS 7) but at times silent (PAS 8). Even after silent aspiration, patient did have eventual cough response, though his cough was never effective to clear aspirate. No aspiration observed even with large straw sips of nectar thick liquids. SLP recommending initiate PO diet of Dys 1 (puree) solids, nectar thick liquids and allow plain/thin water in between meals. Factors that may increase risk of adverse event in presence of aspiration [employer] 2021): Poor general health and/or compromised immunity;Frail or deconditioned;Dependence for feeding and/or oral hygiene;Reduced cognitive function;Limited mobility;Weak cough  Swallow Evaluation Recommendations Recommendations: PO diet PO Diet Recommendation: Dysphagia 1 (Pureed);Thin liquids (Level 0) Liquid Administration via: Cup;Straw Medication Administration: Crushed with puree Supervision: Full supervision/cueing for swallowing strategies;Full assist for feeding Swallowing strategies  : Slow rate;Small bites/sips Postural changes: Position pt fully upright for meals Oral care recommendations: Oral care BID (2x/day);Staff/trained caregiver to provide oral care      Angela Nevin, MA, CCC-SLP Speech Therapy

## 2023-10-01 NOTE — Progress Notes (Signed)
Speech Language Pathology Treatment: Dysphagia  Patient Details Name: Gilbert Wilson MRN: [id number] DOB: 04-Mar-1946 Today's Date: 10/01/2023 Time: 1308-6578 SLP Time Calculation (min) (ACUTE ONLY): 15 min  Assessment / Plan / Recommendation Clinical Impression  Patient seen by SLP for skilled treatment focused on dysphagia goals and PO/MBS readiness. He is awake, alert, sounds less congested and daughter reports he was able to drink some water via straw sips and did well.  Patient able to vocalize/verbalize and voice sounds fairly clear. He was receptive to having sips of water via straw. He did exhibit instances of mildly delayed cough and throat clear which sounded congested and were non-productive. As he is doing better today, SLP is recommending to proceed with MBS this date, pending scheduling with radiology department.    HPI HPI: Patient is a 78 y.o. male with PMH: CVA with right sided hemiplegia, ESRD s/p renal transplant 2016, chronic hep C with cirrhosis, liver failure s/p liver transplant 2016, dementia, seizure disorder. DM-2, HTN, a-fib. He presented to the hospital on 09/28/23 from home with SOB and cough over past two days, fever and one instance of vomiting night before admission. He was recently diagnosed with UTI. In ED, Tmax was 102.3, CXR showing low lung volumes with chronic elevation of right hemidiaphragm, streaky opacities at the medial right lung base could represent atelectasis or infiltrate. Patient was lethargic and not able to follow commands, prompting RN to hold all PO medications and request SLP swallow evaluation.      SLP Plan  Continue with current plan of care;MBS      Recommendations for follow up therapy are one component of a multi-disciplinary discharge planning process, led by the attending physician.  Recommendations may be updated based on patient status, additional functional criteria and insurance authorization.    Recommendations  Diet  recommendations: NPO Medication Administration: Crushed with puree                  Oral care QID;Oral care before and after PO;Staff/trained caregiver to provide oral care   Frequent or constant Supervision/Assistance Dysphagia, unspecified (R13.10)     Continue with current plan of care;MBS     Angela Nevin, MA, CCC-SLP Speech Therapy

## 2023-10-01 NOTE — Progress Notes (Signed)
PROGRESS NOTE    Gilbert Wilson  ZOX:[id number] DOB: [birth year] DOA: 09/28/2023 PCP: [facility]   Brief Narrative: Gilbert Wilson is a 78 y.o. male with a history of hypertension, insulin-dependent diabetes mellitus type 2, atrial fibrillation status post ablation, CVA with right-sided hemiplegia, ESRD s/p renal transplant, CKD, chronic hepatitis C with cirrhosis, liver failure status post liver transplant, dementia, seizure disorder, BPH.  Patient presented secondary to shortness of breath and fever and was found to have evidence of sepsis secondary to influenza A infection.  Patient was also diagnosed with UTI and was treated with ceftriaxone.  Hospitalization complicated by associated acute metabolic encephalopathy and acute respiratory failure with hypoxia secondary to acute illness.  Started for treatment of influenza A infection.   Assessment and Plan:  Sepsis Present on admission. Secondary to influenza A infection. Blood cultures with no growth to date.  Influenza A infection No clear infiltrates on chest imaging. Patient started on Tamiflu -Continue Tamiflu (renally dosed)  Possible community acquired pneumonia Patient with hypoxia and elevated procalcitonin. Patient started on Ceftriaxone for UTI. -Continue Ceftriaxone and add azithromycin  Acute metabolic encephalopathy Secondary to infection.  Complicated by underlying dementia. Improving with ongoing treatment.  Acute respiratory failure with hypoxia Associated tachypnea. Secondary to acute illness. Initial x-ray without overt infectious etiology. -Wean to room air  Dysphagia Speech therapy consulted. SLP reevaluated and plan for MBS today. -Continued SLP recommendations  Rhabdomyolysis Unclear if traumatic vs nontraumatic. Patient is on Crestor which has interaction with cyclosporine; Crestor held. CK of 1,708 on admission. Improved to 807. -Continue IV fluids  History of ESRD s/p renal  transplant CKD stage IV Baseline Creatinine appears to be around 2.4. Currently, creatinine is down to 2.13  Metabolic acidosis In setting of underlying renal disease. Sodium bicarbonate tablets held on admission secondary to NPO status. Managed with sodium bicarb IV infusion. -Restart home sodium bicarb once able to take PO  Hypernatremia Associated hyperchloremia. Likely secondary to normal saline IV fluids and lack of oral intake. -Change to D5 water; hopefull ability to transition to PO intake -BMP in AM  Diabetes mellitus, type 2 Controlled. Hemoglobin A1C of 6.4%. Patient is currently NPO. -Continue SSI  Thrombocytopenia Acute. Likely reactive secondary to acute illness. Resolved.  Chronic microcytic anemia Likely, in part, related to chronic kidney disease in addition to likely iron deficiency anemia. -Consider iron supplementation once acute illness has improved  Primary hypertension Patient is on losartan, amlodipine and hydralazine as an outpatient. Amlodipine and hydralazine initially resumed, but now discontinued. Normotensive to slightly hypertensive.  Atrial fibrillation, unspecified Sinus rhythm. History of ablation. Patient is not on anticoagulation.    DVT prophylaxis: Heparin Subq Code Status:   Code Status: Full Code Family Communication: Daughter at bedside Disposition Plan: Discharge home likely not for 2-3 days pending improvement in mentation, ability to wean oxygen, ability to advance diet   Consultants:  None  Procedures:  None  Antimicrobials: Vancomycin Ceftriaxone Cefepime Flagyl Osteltamivir    Subjective: Patient without concerns today.  Objective: BP (!) 175/60 (BP Location: Left Arm)   Pulse 71   Temp 98.1 F (36.7 C)   Resp 17   Ht 6\' 1"  (1.854 m)   Wt 94.2 kg   SpO2 100%   BMI 27.40 kg/m   Examination:  General exam: Appears calm and comfortable Respiratory system: Diffuse rhonchi that appear to be transmitted from  upper airway. Respiratory effort normal. Cardiovascular system: S1 & S2 heard, RRR. No murmurs, rubs, gallops  or clicks. Gastrointestinal system: Abdomen is nondistended, soft and nontender. Normal bowel sounds heard. Central nervous system: Alert. No focal neurological deficits. Musculoskeletal: No edema. No calf tenderness Skin: No cyanosis. No rashes   Data Reviewed: I have personally reviewed following labs and imaging studies  CBC Lab Results  Component Value Date   WBC 5.3 10/01/2023   RBC 3.92 (L) 10/01/2023   HGB 9.6 (L) 10/01/2023   HCT 30.8 (L) 10/01/2023   MCV 78.6 (L) 10/01/2023   MCH 24.5 (L) 10/01/2023   PLT 166 10/01/2023   MCHC 31.2 10/01/2023   RDW 16.7 (H) 10/01/2023   LYMPHSABS 0.9 09/30/2023   MONOABS 0.7 09/30/2023   EOSABS 0.0 09/30/2023   BASOSABS 0.0 09/30/2023     Last metabolic panel Lab Results  Component Value Date   NA 148 (H) 10/01/2023   K 3.9 10/01/2023   CL 123 (H) 10/01/2023   CO2 17 (L) 10/01/2023   BUN 41 (H) 10/01/2023   CREATININE 2.13 (H) 10/01/2023   GLUCOSE 67 (L) 10/01/2023   GFRNONAA 31 (L) 10/01/2023   CALCIUM 9.2 10/01/2023   PHOS 3.3 09/29/2023   PROT 7.1 09/28/2023   ALBUMIN 3.3 (L) 09/28/2023   BILITOT 0.6 09/28/2023   ALKPHOS 62 09/28/2023   AST 15 09/28/2023   ALT 12 09/28/2023   ANIONGAP 8 10/01/2023    GFR: Estimated Creatinine Clearance: 33.3 mL/min (A) (by C-G formula based on SCr of 2.13 mg/dL (H)).  Recent Results (from the past 240 hours)  Resp panel by RT-PCR (RSV, Flu A&B, Covid) Anterior Nasal Swab     Status: Abnormal   Collection Time: 09/28/23  8:59 AM   Specimen: Anterior Nasal Swab  Result Value Ref Range Status   SARS Coronavirus 2 by RT PCR NEGATIVE NEGATIVE Final   Influenza A by PCR POSITIVE (A) NEGATIVE Final   Influenza B by PCR NEGATIVE NEGATIVE Final    Comment: (NOTE) The Xpert Xpress SARS-CoV-2/FLU/RSV plus assay is intended as an aid in the diagnosis of influenza from  Nasopharyngeal swab specimens and should not be used as a sole basis for treatment. Nasal washings and aspirates are unacceptable for Xpert Xpress SARS-CoV-2/FLU/RSV testing.  Fact Sheet for Patients: BloggerCourse.com  Fact Sheet for Healthcare Providers: SeriousBroker.it  This test is not yet approved or cleared by the [place] FDA and has been authorized for detection and/or diagnosis of SARS-CoV-2 by FDA under an Emergency Use Authorization (EUA). This EUA will remain in effect (meaning this test can be used) for the duration of the COVID-19 declaration under Section 564(b)(1) of the Act, 21 U.S.C. section 360bbb-3(b)(1), unless the authorization is terminated or revoked.     Resp Syncytial Virus by PCR NEGATIVE NEGATIVE Final    Comment: (NOTE) Fact Sheet for Patients: BloggerCourse.com  Fact Sheet for Healthcare Providers: SeriousBroker.it  This test is not yet approved or cleared by the [place] FDA and has been authorized for detection and/or diagnosis of SARS-CoV-2 by FDA under an Emergency Use Authorization (EUA). This EUA will remain in effect (meaning this test can be used) for the duration of the COVID-19 declaration under Section 564(b)(1) of the Act, 21 U.S.C. section 360bbb-3(b)(1), unless the authorization is terminated or revoked.  Performed at [facility], [address], [place], [place] [address]   Blood Culture (routine x 2)     Status: None (Preliminary result)   Collection Time: 09/28/23  9:07 AM   Specimen: BLOOD LEFT ARM  Result Value Ref  Range Status   Specimen Description BLOOD LEFT ARM  Final   Special Requests   Final    BOTTLES DRAWN AEROBIC AND ANAEROBIC Blood Culture results may not be optimal due to an inadequate volume of blood received in culture bottles   Culture   Final    NO GROWTH 3 DAYS Performed at [facility], [address], [place], [place] [address]    Report Status PENDING  Incomplete  Blood Culture (routine x 2)     Status: None (Preliminary result)   Collection Time: 09/28/23  9:07 AM   Specimen: BLOOD LEFT ARM  Result Value Ref Range Status   Specimen Description BLOOD LEFT ARM  Final   Special Requests   Final    BOTTLES DRAWN AEROBIC AND ANAEROBIC Blood Culture results may not be optimal due to an inadequate volume of blood received in culture bottles   Culture   Final    NO GROWTH 3 DAYS Performed at [facility], [address], [place], [place] [address]    Report Status PENDING  Incomplete  Urine Culture     Status: None   Collection Time: 09/28/23 11:26 AM   Specimen: Urine, Random  Result Value Ref Range Status   Specimen Description URINE, RANDOM  Final   Special Requests NONE Reflexed from [id number]  Final   Culture   Final    NO GROWTH Performed at [facility], [address], [place], [place] [address]    Report Status 09/29/2023 FINAL  Final  Respiratory (~20 pathogens) panel by PCR     Status: Abnormal   Collection Time: 09/29/23 11:06 AM   Specimen: Nasopharyngeal Swab; Respiratory  Result Value Ref Range Status   Adenovirus NOT DETECTED NOT DETECTED Corrected   Coronavirus 229E NOT DETECTED NOT DETECTED Corrected    Comment: (NOTE) The Coronavirus on the Respiratory Panel, DOES NOT test for the novel  Coronavirus (2019 nCoV) CORRECTED ON 01/28 AT 2020: PREVIOUSLY REPORTED AS NOT DETECTED    Coronavirus HKU1 NOT DETECTED NOT DETECTED Corrected   Coronavirus NL63 NOT DETECTED NOT DETECTED Corrected   Coronavirus OC43 NOT DETECTED NOT DETECTED Corrected   Metapneumovirus NOT DETECTED NOT DETECTED Corrected   Rhinovirus / Enterovirus NOT DETECTED NOT DETECTED Corrected   Influenza A H3 DETECTED (A) NOT DETECTED Corrected   Influenza B NOT DETECTED NOT DETECTED Corrected   Parainfluenza Virus 1 NOT DETECTED NOT DETECTED Corrected    Parainfluenza Virus 2 NOT DETECTED NOT DETECTED Corrected   Parainfluenza Virus 3 NOT DETECTED NOT DETECTED Corrected   Parainfluenza Virus 4 NOT DETECTED NOT DETECTED Corrected   Respiratory Syncytial Virus NOT DETECTED NOT DETECTED Corrected   Bordetella pertussis NOT DETECTED NOT DETECTED Corrected   Bordetella Parapertussis NOT DETECTED NOT DETECTED Corrected   Chlamydophila pneumoniae NOT DETECTED NOT DETECTED Corrected   Mycoplasma pneumoniae NOT DETECTED NOT DETECTED Corrected    Comment: Performed at [facility], [address], [place], [place] [address]      Radiology Studies: DG CHEST PORT 1 VIEW Result Date: 09/30/2023 CLINICAL DATA:  Hypoxia EXAM: PORTABLE CHEST 1 VIEW COMPARISON:  09/28/2023 FINDINGS: Stable heart size. Aortic atherosclerosis. Pulmonary vascular congestion. Low lung volumes with elevation of the right hemidiaphragm. Increasing streaky perihilar opacities. No pleural effusion or pneumothorax. IMPRESSION: Low lung volumes with pulmonary vascular congestion and increasing streaky perihilar opacities, which may reflect edema or atelectasis. Electronically Signed   By: Duanne Guess D.O.  On: 09/30/2023 15:23      LOS: 3 days    Jacquelin Hawking, MD [facility] 10/01/2023, 9:58 AM   If 7PM-7AM, please contact night-coverage [email or web address]

## 2023-10-01 NOTE — Plan of Care (Signed)
  Problem: Pain Managment: Goal: General experience of comfort will improve and/or be controlled Outcome: Progressing   Problem: Safety: Goal: Ability to remain free from injury will improve Outcome: Progressing

## 2023-10-02 DIAGNOSIS — J09X1 Influenza due to identified novel influenza A virus with pneumonia: Secondary | ICD-10-CM | POA: Diagnosis not present

## 2023-10-02 DIAGNOSIS — J189 Pneumonia, unspecified organism: Secondary | ICD-10-CM | POA: Diagnosis not present

## 2023-10-02 LAB — GLUCOSE, CAPILLARY
Glucose-Capillary: 89 mg/dL (ref 70–99)
Glucose-Capillary: 92 mg/dL (ref 70–99)
Glucose-Capillary: 164 mg/dL — ABNORMAL HIGH (ref 70–99)
Glucose-Capillary: 172 mg/dL — ABNORMAL HIGH (ref 70–99)
Glucose-Capillary: 174 mg/dL — ABNORMAL HIGH (ref 70–99)

## 2023-10-02 LAB — BASIC METABOLIC PANEL
Anion gap: 10 (ref 5–15)
BUN: 31 mg/dL — ABNORMAL HIGH (ref 8–23)
CO2: 18 mmol/L — ABNORMAL LOW (ref 22–32)
Calcium: 9.4 mg/dL (ref 8.9–10.3)
Chloride: 120 mmol/L — ABNORMAL HIGH (ref 98–111)
Creatinine, Ser: 1.89 mg/dL — ABNORMAL HIGH (ref 0.61–1.24)
GFR, Estimated: 36 mL/min — ABNORMAL LOW (ref >60)
Glucose, Bld: 99 mg/dL (ref 70–99)
Potassium: 3.8 mmol/L (ref 3.5–5.1)
Sodium: 148 mmol/L — ABNORMAL HIGH (ref 135–145)

## 2023-10-02 MED ORDER — DEXTROSE 5 % IV SOLN: INTRAVENOUS | Status: AC

## 2023-10-02 MED ORDER — MIRTAZAPINE 15 MG PO TABS
7.5000 mg | ORAL_TABLET | Freq: Every day | ORAL | Status: DC
  Administered 2023-10-02 – 2023-10-03 (×2): 7.5 mg via ORAL
  Filled 2023-10-02 (×2): qty 1

## 2023-10-02 MED ORDER — CLOPIDOGREL BISULFATE 75 MG PO TABS
75.0000 mg | ORAL_TABLET | Freq: Every day | ORAL | Status: DC
  Administered 2023-10-02 – 2023-10-04 (×3): 75 mg via ORAL
  Filled 2023-10-02 (×3): qty 1

## 2023-10-02 NOTE — Progress Notes (Signed)
PROGRESS NOTE    Gilbert Wilson  NWG:[id number] DOB: 07-13-1946 DOA: 09/28/2023 PCP: [facility]   Brief Narrative: Gilbert Wilson is a 78 y.o. male with a history of hypertension, insulin-dependent diabetes mellitus type 2, atrial fibrillation status post ablation, CVA with right-sided hemiplegia, ESRD s/p renal transplant, CKD, chronic hepatitis C with cirrhosis, liver failure status post liver transplant, dementia, seizure disorder, BPH.  Patient presented secondary to shortness of breath and fever and was found to have evidence of sepsis secondary to influenza A infection.  Patient was also diagnosed with UTI and was treated with ceftriaxone.  Hospitalization complicated by associated acute metabolic encephalopathy and acute respiratory failure with hypoxia secondary to acute illness.  Started for treatment of influenza A infection.   Assessment and Plan:  Sepsis Present on admission. Secondary to influenza A infection. Blood cultures with no growth to date.  Influenza A infection No clear infiltrates on chest imaging. Patient started on Tamiflu -Continue Tamiflu (renally dosed)  Possible community acquired pneumonia Patient with hypoxia and elevated procalcitonin. Patient started on Ceftriaxone for UTI. -Continue Ceftriaxone and azithromycin  Acute metabolic encephalopathy Secondary to infection.  Complicated by underlying dementia. Improved with ongoing treatment.  Acute respiratory failure with hypoxia Associated tachypnea. Secondary to acute illness. Initial x-ray without overt infectious etiology. -Wean to room air  Dysphagia Speech therapy consulted. SLP reevaluated and performed a MBS on 1/30. -SLP recommendations (1/30): PO Diet Recommendation: Dysphagia 1 (Pureed);Thin liquids (Level 0) Liquid Administration via: Cup;Straw Medication Administration: Crushed with puree Supervision: Full supervision/cueing for swallowing strategies;Full assist for  feeding Swallowing strategies  : Slow rate;Small bites/sips Postural changes: Position pt fully upright for meals Oral care recommendations: Oral care BID (2x/day);Staff/trained caregiver to provide oral care  Rhabdomyolysis Unclear if traumatic vs nontraumatic. Patient is on Crestor which has interaction with cyclosporine; Crestor held. CK of 1,708 on admission. Improved to 807.  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 Currently, creatinine is down to 2.13  Metabolic acidosis In setting of underlying renal disease. Sodium bicarbonate tablets held on admission secondary to NPO status. Managed with sodium bicarb IV infusion. -Restart home sodium bicarb once able to take PO  Hypernatremia Associated hyperchloremia. Likely secondary to normal saline IV fluids and lack of oral intake. Sodium has stabilized at 148. Patient improving oral intake -Continue D5 water -Encourage PO intake -BMP in AM  Diabetes mellitus, type 2 Controlled. Hemoglobin A1C of 6.4%. Patient is currently NPO. -Continue SSI  Thrombocytopenia Acute. Likely reactive secondary to acute illness. Resolved.  Chronic microcytic anemia Likely, in part, related to chronic kidney disease in addition to likely iron deficiency anemia. -Consider iron supplementation once acute illness has improved  Primary hypertension Patient is on losartan, amlodipine and hydralazine as an outpatient. Amlodipine and hydralazine initially resumed, but now discontinued. Normotensive to slightly hypertensive.  Atrial fibrillation, unspecified Sinus rhythm. History of ablation. Patient is not on anticoagulation.    DVT prophylaxis: Heparin Subq Code Status:   Code Status: Full Code Family Communication: Daughter at bedside Disposition Plan: Discharge home likely not for 1-2 days pending improvement in mentation, ability to wean oxygen   Consultants:  None  Procedures:   Modified barium swallow (1/30)  Antimicrobials: Vancomycin Ceftriaxone Cefepime Flagyl Osteltamivir  Azithromycin   Subjective: No specific concerns today. Doing better. Per daughter, patient asking to go home.  Objective: BP (!) 172/56 (BP Location: Left Arm)   Pulse 61   Temp 98.1  F (36.7 C) (Oral)   Resp 20   Ht 6\' 1"  (1.854 m)   Wt 94.2 kg   SpO2 100%   BMI 27.40 kg/m   Examination:  General exam: Appears calm and comfortable Respiratory system: Transmitted upper airway sounds with rhonchi heard. Respiratory effort normal. Cardiovascular system: S1 & S2 heard, RRR. No murmurs. Gastrointestinal system: Abdomen is nondistended, soft and nontender. Normal bowel sounds heard. Central nervous system: Alert. No focal neurological deficits.   Data Reviewed: I have personally reviewed following labs and imaging studies  CBC Lab Results  Component Value Date   WBC 5.3 10/01/2023   RBC 3.92 (L) 10/01/2023   HGB 9.6 (L) 10/01/2023   HCT 30.8 (L) 10/01/2023   MCV 78.6 (L) 10/01/2023   MCH 24.5 (L) 10/01/2023   PLT 166 10/01/2023   MCHC 31.2 10/01/2023   RDW 16.7 (H) 10/01/2023   LYMPHSABS 0.9 09/30/2023   MONOABS 0.7 09/30/2023   EOSABS 0.0 09/30/2023   BASOSABS 0.0 09/30/2023     Last metabolic panel Lab Results  Component Value Date   NA 148 (H) 10/02/2023   K 3.8 10/02/2023   CL 120 (H) 10/02/2023   CO2 18 (L) 10/02/2023   BUN 31 (H) 10/02/2023   CREATININE 1.89 (H) 10/02/2023   GLUCOSE 99 10/02/2023   GFRNONAA 36 (L) 10/02/2023   CALCIUM 9.4 10/02/2023   PHOS 3.3 09/29/2023   PROT 7.1 09/28/2023   ALBUMIN 3.3 (L) 09/28/2023   BILITOT 0.6 09/28/2023   ALKPHOS 62 09/28/2023   AST 15 09/28/2023   ALT 12 09/28/2023   ANIONGAP 10 10/02/2023    GFR: Estimated Creatinine Clearance: 37.6 mL/min (A) (by C-G formula based on SCr of 1.89 mg/dL (H)).  Recent Results (from the past 240 hours)  Resp panel by RT-PCR (RSV, Flu A&B, Covid) Anterior  Nasal Swab     Status: Abnormal   Collection Time: 09/28/23  8:59 AM   Specimen: Anterior Nasal Swab  Result Value Ref Range Status   SARS Coronavirus 2 by RT PCR NEGATIVE NEGATIVE Final   Influenza A by PCR POSITIVE (A) NEGATIVE Final   Influenza B by PCR NEGATIVE NEGATIVE Final    Comment: (NOTE) The Xpert Xpress SARS-CoV-2/FLU/RSV plus assay is intended as an aid in the diagnosis of influenza from Nasopharyngeal swab specimens and should not be used as a sole basis for treatment. Nasal washings and aspirates are unacceptable for Xpert Xpress SARS-CoV-2/FLU/RSV testing.  Fact Sheet for Patients: BloggerCourse.com  Fact Sheet for Healthcare Providers: SeriousBroker.it  This test is not yet approved or cleared by the [place] FDA and has been authorized for detection and/or diagnosis of SARS-CoV-2 by FDA under an Emergency Use Authorization (EUA). This EUA will remain in effect (meaning this test can be used) for the duration of the COVID-19 declaration under Section 564(b)(1) of the Act, 21 U.S.C. section 360bbb-3(b)(1), unless the authorization is terminated or revoked.     Resp Syncytial Virus by PCR NEGATIVE NEGATIVE Final    Comment: (NOTE) Fact Sheet for Patients: BloggerCourse.com  Fact Sheet for Healthcare Providers: SeriousBroker.it  This test is not yet approved or cleared by the [place] FDA and has been authorized for detection and/or diagnosis of SARS-CoV-2 by FDA under an Emergency Use Authorization (EUA). This EUA will remain in effect (meaning this test can be used) for the duration of the COVID-19 declaration under Section 564(b)(1) of the Act, 21 U.S.C. section 360bbb-3(b)(1), unless the authorization is terminated or revoked.  Performed at [facility], [address], [place], [place] [address]   Blood Culture (routine x 2)     Status:  None (Preliminary result)   Collection Time: 09/28/23  9:07 AM   Specimen: BLOOD LEFT ARM  Result Value Ref Range Status   Specimen Description BLOOD LEFT ARM  Final   Special Requests   Final    BOTTLES DRAWN AEROBIC AND ANAEROBIC Blood Culture results may not be optimal due to an inadequate volume of blood received in culture bottles   Culture   Final    NO GROWTH 4 DAYS Performed at [facility], [address] [place], [place] [address]    Report Status PENDING  Incomplete  Blood Culture (routine x 2)     Status: None (Preliminary result)   Collection Time: 09/28/23  9:07 AM   Specimen: BLOOD LEFT ARM  Result Value Ref Range Status   Specimen Description BLOOD LEFT ARM  Final   Special Requests   Final    BOTTLES DRAWN AEROBIC AND ANAEROBIC Blood Culture results may not be optimal due to an inadequate volume of blood received in culture bottles   Culture   Final    NO GROWTH 4 DAYS Performed at [facility], [address], [place], [place] [address]    Report Status PENDING  Incomplete  Urine Culture     Status: None   Collection Time: 09/28/23 11:26 AM   Specimen: Urine, Random  Result Value Ref Range Status   Specimen Description URINE, RANDOM  Final   Special Requests NONE Reflexed from [id number]  Final   Culture   Final    NO GROWTH Performed at [facility], [address] [place], [place] [address]    Report Status 09/29/2023 FINAL  Final  Respiratory (~20 pathogens) panel by PCR     Status: Abnormal   Collection Time: 09/29/23 11:06 AM   Specimen: Nasopharyngeal Swab; Respiratory  Result Value Ref Range Status   Adenovirus NOT DETECTED NOT DETECTED Corrected   Coronavirus 229E NOT DETECTED NOT DETECTED Corrected    Comment: (NOTE) The Coronavirus on the Respiratory Panel, DOES NOT test for the novel  Coronavirus (2019 nCoV) CORRECTED ON 01/28 AT 2020: PREVIOUSLY REPORTED AS NOT DETECTED    Coronavirus HKU1 NOT DETECTED NOT DETECTED Corrected    Coronavirus NL63 NOT DETECTED NOT DETECTED Corrected   Coronavirus OC43 NOT DETECTED NOT DETECTED Corrected   Metapneumovirus NOT DETECTED NOT DETECTED Corrected   Rhinovirus / Enterovirus NOT DETECTED NOT DETECTED Corrected   Influenza A H3 DETECTED (A) NOT DETECTED Corrected   Influenza B NOT DETECTED NOT DETECTED Corrected   Parainfluenza Virus 1 NOT DETECTED NOT DETECTED Corrected   Parainfluenza Virus 2 NOT DETECTED NOT DETECTED Corrected   Parainfluenza Virus 3 NOT DETECTED NOT DETECTED Corrected   Parainfluenza Virus 4 NOT DETECTED NOT DETECTED Corrected   Respiratory Syncytial Virus NOT DETECTED NOT DETECTED Corrected   Bordetella pertussis NOT DETECTED NOT DETECTED Corrected   Bordetella Parapertussis NOT DETECTED NOT DETECTED Corrected   Chlamydophila pneumoniae NOT DETECTED NOT DETECTED Corrected   Mycoplasma pneumoniae NOT DETECTED NOT DETECTED Corrected    Comment: Performed at [facility], [address], [place], [place] [address]      Radiology Studies: DG Swallowing Func-Speech Pathology Result Date: 10/01/2023 Table formatting from the original result was not included. Modified Barium Swallow Study Patient Details Name: Denorris Reust MRN: [id number] Date of Birth: Jul 29, 1946  Today's Date: 10/01/2023 HPI/PMH: HPI: Patient is a 78 y.o. male with PMH: CVA with right sided hemiplegia, ESRD s/p renal transplant 2016, chronic hep C with cirrhosis, liver failure s/p liver transplant 2016, dementia, seizure disorder. DM-2, HTN, a-fib. He presented to the hospital on 09/28/23 from home with SOB and cough over past two days, fever and one instance of vomiting night before admission. He was recently diagnosed with UTI. In ED, Tmax was 102.3, CXR showing low lung volumes with chronic elevation of right hemidiaphragm, streaky opacities at the medial right lung base could represent atelectasis or infiltrate. Patient was lethargic and not able to follow commands, prompting RN to hold  all PO medications and request SLP swallow evaluation. Clinical Impression: Clinical Impression: Patient presents with an oropharyngeal dysphagia with silent aspiration occuring with thin liquid boluses secondary to mistiming of epiglottic inversion leading to delayed closer of laryngeal vestibule. During oral phase, patient with disorganized lingual movements resulting in delayed anterior to posterior transit of boluses. Swallow was initiated at level of pyriform sinus with barium still remaining in oral cavity when swallow initiated. Anterior hyoid excursion,, epiglottic inversion and laryngeal vestibule closure were all complete/WFL. During the majority of swallows, boluses fully cleared pharynx during initial swallow. Suspected cervical osteophytes (no radiologist present to confirm) did appear to result in trace to minimal amount of barium remaining above PES after initial swallows with liquid boluses. When aspiration occurred, it was with thin liquids and secondary to prolonged delay of swallow initiation, such that barium started to enter open airway before swallow was initiated. Aspiration was sensed at times (PAS 7) but at times silent (PAS 8). Even after silent aspiration, patient did have eventual cough response, though his cough was never effective to clear aspirate. No aspiration observed even with large straw sips of nectar thick liquids. SLP recommending initiate PO diet of Dys 1 (puree) solids, nectar thick liquids and allow plain/thin water in between meals. Factors that may increase risk of adverse event in presence of aspiration Rubye Oaks & Clearance Coots 2021): Factors that may increase risk of adverse event in presence of aspiration Rubye Oaks & Clearance Coots 2021): Poor general health and/or compromised immunity; Frail or deconditioned; Dependence for feeding and/or oral hygiene; Reduced cognitive function; Limited mobility; Weak cough Recommendations/Plan: Swallowing Evaluation Recommendations Swallowing  Evaluation Recommendations Recommendations: PO diet PO Diet Recommendation: Dysphagia 1 (Pureed); Thin liquids (Level 0) Liquid Administration via: Cup; Straw Medication Administration: Crushed with puree Supervision: Full supervision/cueing for swallowing strategies; Full assist for feeding Swallowing strategies  : Slow rate; Small bites/sips Postural changes: Position pt fully upright for meals Oral care recommendations: Oral care BID (2x/day); Staff/trained caregiver to provide oral care Treatment Plan Treatment Plan Treatment recommendations: Therapy as outlined in treatment plan below Follow-up recommendations: Other (comment) (SLP at next venue of care) Functional status assessment: Patient has had a recent decline in their functional status and demonstrates the ability to make significant improvements in function in a reasonable and predictable amount of time. Treatment frequency: Min 2x/week Treatment duration: 1 week Interventions: Aspiration precaution training; Trials of upgraded texture/liquids; Diet toleration management by SLP; Patient/family education Recommendations Recommendations for follow up therapy are one component of a multi-disciplinary discharge planning process, led by the attending physician.  Recommendations may be updated based on patient status, additional functional criteria and insurance authorization. Assessment: Orofacial Exam: Orofacial Exam Oral Cavity: Oral Hygiene: WFL Oral Cavity - Dentition: Dentures, top; Dentures, bottom Orofacial Anatomy: WFL Oral Motor/Sensory Function: Unable to test Anatomy: Anatomy: Suspected cervical  osteophytes Boluses Administered: Boluses Administered Boluses Administered: Thin liquids (Level 0); Mildly thick liquids (Level 2, nectar thick); Puree  Oral Impairment Domain: Oral Impairment Domain Lip Closure: Escape from interlabial space or lateral juncture, no extension beyond vermillion border Tongue control during bolus hold: Not tested Bolus  transport/lingual motion: Slow tongue motion; Repetitive/disorganized tongue motion Oral residue: Residue collection on oral structures Location of oral residue : Tongue Initiation of pharyngeal swallow : Pyriform sinuses  Pharyngeal Impairment Domain: Pharyngeal Impairment Domain Soft palate elevation: No bolus between soft palate (SP)/pharyngeal wall (PW) Laryngeal elevation: Complete superior movement of thyroid cartilage with complete approximation of arytenoids to epiglottic petiole Anterior hyoid excursion: Complete anterior movement Epiglottic movement: Complete inversion Laryngeal vestibule closure: Complete, no air/contrast in laryngeal vestibule Pharyngeal stripping wave : Present - complete Pharyngeal contraction (A/P view only): N/A Pharyngoesophageal segment opening: Complete distension and complete duration, no obstruction of flow Tongue base retraction: No contrast between tongue base and posterior pharyngeal wall (PPW) Pharyngeal residue: Complete pharyngeal clearance Location of pharyngeal residue: N/A  Esophageal Impairment Domain: Esophageal Impairment Domain Esophageal clearance upright position: Esophageal retention Pill: No data recorded Penetration/Aspiration Scale Score: Penetration/Aspiration Scale Score 1.  Material does not enter airway: Mildly thick liquids (Level 2, nectar thick); Puree 7.  Material enters airway, passes BELOW cords and not ejected out despite cough attempt by patient: Thin liquids (Level 0) 8.  Material enters airway, passes BELOW cords without attempt by patient to eject out (silent aspiration) : Thin liquids (Level 0) Compensatory Strategies: Compensatory Strategies Compensatory strategies: Yes Straw: Effective Effective Straw: Thin liquid (Level 0); Mildly thick liquid (Level 2, nectar thick)   General Information: Caregiver present: Yes  Diet Prior to this Study: NPO   Temperature : Normal   Respiratory Status: Increased WOB   Supplemental O2: Nasal cannula    History of Recent Intubation: No  Behavior/Cognition: Alert; Lethargic/Drowsy; Requires cueing; Confused Self-Feeding Abilities: Dependent for feeding Baseline vocal quality/speech: Not observed Volitional Cough: Unable to elicit Volitional Swallow: Unable to elicit Exam Limitations: Other (comment) (refusal of some boluses) Goal Planning: Prognosis for improved oropharyngeal function: Fair Barriers to Reach Goals: Cognitive deficits; Time post onset; Behavior No data recorded No data recorded Consulted and agree with results and recommendations: Family member/caregiver Pain: Pain Assessment Pain Assessment: Faces Pain Score: 0 Faces Pain Scale: 0 End of Session: Start Time:SLP Start Time (ACUTE ONLY): 0935 Stop Time: SLP Stop Time (ACUTE ONLY): 0950 Time Calculation:SLP Time Calculation (min) (ACUTE ONLY): 15 min Charges: SLP Evaluations $ SLP Speech Visit: 1 Visit SLP Evaluations $Swallowing Treatment: 1 Procedure SLP visit diagnosis: SLP Visit Diagnosis: Dysphagia, oropharyngeal phase (R13.12) Past Medical History: Past Medical History: Diagnosis Date  CVA (cerebral vascular accident) (HCC) 2019  Dementia (HCC)   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 Aortic atherosclerosis. Pulmonary vascular congestion. Low lung volumes with elevation of the right hemidiaphragm. Increasing streaky perihilar opacities. No pleural  effusion or pneumothorax. IMPRESSION: Low  lung volumes with pulmonary vascular congestion and increasing streaky perihilar opacities, which may reflect edema or atelectasis. Electronically Signed   By: Duanne Guess D.O.   On: 09/30/2023 15:23      LOS: 4 days    Jacquelin Hawking, MD [facility] 10/02/2023, 9:48 AM   If 7PM-7AM, please contact night-coverage [email or web address]

## 2023-10-02 NOTE — Plan of Care (Signed)
  Problem: Pain Managment: Goal: General experience of comfort will improve and/or be controlled Outcome: Progressing   Problem: Safety: Goal: Ability to remain free from injury will improve Outcome: Progressing

## 2023-10-03 DIAGNOSIS — J09X1 Influenza due to identified novel influenza A virus with pneumonia: Secondary | ICD-10-CM | POA: Diagnosis not present

## 2023-10-03 DIAGNOSIS — J189 Pneumonia, unspecified organism: Secondary | ICD-10-CM | POA: Diagnosis not present

## 2023-10-03 LAB — GLUCOSE, CAPILLARY
Glucose-Capillary: 115 mg/dL — ABNORMAL HIGH (ref 70–99)
Glucose-Capillary: 135 mg/dL — ABNORMAL HIGH (ref 70–99)
Glucose-Capillary: 143 mg/dL — ABNORMAL HIGH (ref 70–99)
Glucose-Capillary: 150 mg/dL — ABNORMAL HIGH (ref 70–99)
Glucose-Capillary: 157 mg/dL — ABNORMAL HIGH (ref 70–99)
Glucose-Capillary: 97 mg/dL (ref 70–99)

## 2023-10-03 LAB — BASIC METABOLIC PANEL
Anion gap: 9 (ref 5–15)
BUN: 27 mg/dL — ABNORMAL HIGH (ref 8–23)
CO2: 17 mmol/L — ABNORMAL LOW (ref 22–32)
Calcium: 9.7 mg/dL (ref 8.9–10.3)
Chloride: 121 mmol/L — ABNORMAL HIGH (ref 98–111)
Creatinine, Ser: 1.7 mg/dL — ABNORMAL HIGH (ref 0.61–1.24)
GFR, Estimated: 41 mL/min — ABNORMAL LOW (ref >60)
Glucose, Bld: 109 mg/dL — ABNORMAL HIGH (ref 70–99)
Potassium: 4 mmol/L (ref 3.5–5.1)
Sodium: 147 mmol/L — ABNORMAL HIGH (ref 135–145)

## 2023-10-03 LAB — CULTURE, BLOOD (ROUTINE X 2)
Culture: NO GROWTH
Culture: NO GROWTH

## 2023-10-03 MED ORDER — DEXTROSE 5 % IV SOLN: INTRAVENOUS | Status: AC

## 2023-10-03 NOTE — Progress Notes (Signed)
Speech Language Pathology Treatment: Dysphagia  Patient Details Name: Gilbert Wilson MRN: [id number] DOB: 1945/12/29 Today's Date: 10/03/2023 Time: 9147-8295 SLP Time Calculation (min) (ACUTE ONLY): 13 min  Assessment / Plan / Recommendation Clinical Impression  Pt's daughter states he has been doing well with current diet of Dys 1 solids, but is eager to upgrade. Observed pt with straw sips of nectar thick liquids without overt s/s of aspiration. He achieved more effective and efficient mastication of Dys 3 solids, although it was still somewhat prolonged. Recommend upgrading diet to Dys 2 solids and continuing nectar thick liquids. SLP provided thickener starter kit. Will f/u acutely and recommend ongoing SLP intervention with [facility] SLP.    HPI HPI: Patient is a 78 y.o. male with PMH: CVA with right sided hemiplegia, ESRD s/p renal transplant 2016, chronic hep C with cirrhosis, liver failure s/p liver transplant 2016, dementia, seizure disorder. DM-2, HTN, a-fib. He presented to the hospital on 09/28/23 from home with SOB and cough over past two days, fever and one instance of vomiting night before admission. He was recently diagnosed with UTI. In ED, Tmax was 102.3, CXR showing low lung volumes with chronic elevation of right hemidiaphragm, streaky opacities at the medial right lung base could represent atelectasis or infiltrate. Patient was lethargic and not able to follow commands, prompting RN to hold all PO medications and request SLP swallow evaluation.      SLP Plan  Continue with current plan of care      Recommendations for follow up therapy are one component of a multi-disciplinary discharge planning process, led by the attending physician.  Recommendations may be updated based on patient status, additional functional criteria and insurance authorization.    Recommendations  Diet recommendations: Dysphagia 2 (fine chop);Nectar-thick liquid Liquids provided via: Cup;Straw Medication  Administration: Crushed with puree Supervision: Staff to assist with self feeding;Full supervision/cueing for compensatory strategies;Trained caregiver to feed patient Compensations: Minimize environmental distractions;Slow rate;Small sips/bites Postural Changes and/or Swallow Maneuvers: Seated upright 90 degrees                  Oral care QID;Oral care before and after PO;Staff/trained caregiver to provide oral care   Frequent or constant Supervision/Assistance Dysphagia, oropharyngeal phase (R13.12)     Continue with current plan of care     Gwynneth Aliment, M.A., CF-SLP Speech Language Pathology, Acute Rehabilitation Services  Secure Chat preferred [phone]   10/03/2023, 9:26 AM

## 2023-10-03 NOTE — Plan of Care (Signed)

## 2023-10-03 NOTE — Progress Notes (Signed)
BP 174/57 Nettey Md notified.  Scheduled hydralazine 50mg  given.

## 2023-10-03 NOTE — Progress Notes (Addendum)
PROGRESS NOTE    Gilbert Wilson  ZOX:[id number] DOB: 1946-06-21 DOA: 09/28/2023 PCP: [facility]   Brief Narrative: Gilbert Wilson is a 78 y.o. male with a history of hypertension, insulin-dependent diabetes mellitus type 2, atrial fibrillation status post ablation, CVA with right-sided hemiplegia, ESRD s/p renal transplant, CKD, chronic hepatitis C with cirrhosis, liver failure status post liver transplant, dementia, seizure disorder, BPH.  Patient presented secondary to shortness of breath and fever and was found to have evidence of sepsis secondary to influenza A infection.  Patient was also diagnosed with UTI and was treated with ceftriaxone.  Hospitalization complicated by associated acute metabolic encephalopathy, acute respiratory failure with hypoxia secondary to acute illness and hypernatremia.  Started for treatment of influenza A infection.   Assessment and Plan:  Sepsis Present on admission. Secondary to influenza A infection. Blood cultures with no growth to date.  Influenza A infection No clear infiltrates on chest imaging. Patient started on Tamiflu and completed course.  Possible community acquired pneumonia Patient with hypoxia and elevated procalcitonin. Patient started on Ceftriaxone for UTI. -Continue Ceftriaxone and azithromycin  Acute metabolic encephalopathy Secondary to infection.  Complicated by underlying dementia. Improved with ongoing treatment.  Acute respiratory failure with hypoxia Associated tachypnea. Secondary to acute illness. Initial x-ray without overt infectious etiology. Weaned to room air.  Dysphagia Speech therapy consulted. SLP reevaluated and performed a MBS on 1/30. -SLP recommendations (2/1): Diet recommendations: Dysphagia 2 (fine chop);Nectar-thick liquid Liquids provided via: Cup;Straw Medication Administration: Crushed with puree Supervision: Staff to assist with self feeding;Full supervision/cueing for compensatory  strategies;Trained caregiver to feed patient Compensations: Minimize environmental distractions;Slow rate;Small sips/bites Postural Changes and/or Swallow Maneuvers: Seated upright 90 degrees  Rhabdomyolysis Unclear if traumatic vs nontraumatic. Patient is on Crestor which has interaction with cyclosporine; Crestor held. CK of 1,708 on admission. Improved to 807.  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 Currently, creatinine is down to 1.70  Metabolic acidosis In setting of underlying renal disease. Sodium bicarbonate tablets held on admission secondary to NPO status. Managed with sodium bicarb IV infusion and transition to sodium bicarb tablets. -Continue home sodium bicarb  Hypernatremia Associated hyperchloremia. Likely secondary to normal saline IV fluids and lack of oral intake. Sodium has improved slightly to 147. Patient improving oral intake -Increase to D5 water 80 mL/hr -Encourage PO intake -BMP in AM  Diabetes mellitus, type 2 Controlled. Hemoglobin A1C of 6.4%. -Continue SSI  Thrombocytopenia Acute. Likely reactive secondary to acute illness. Resolved.  Chronic microcytic anemia Likely, in part, related to chronic kidney disease in addition to likely iron deficiency anemia. -Consider iron supplementation once acute illness has improved  Primary hypertension Patient is on losartan, amlodipine and hydralazine as an outpatient. Amlodipine and hydralazine initially resumed, but now discontinued. Normotensive to slightly hypertensive.  Atrial fibrillation, unspecified Sinus rhythm. History of ablation. Patient is not on anticoagulation.    DVT prophylaxis: Heparin Subq Code Status:   Code Status: Full Code Family Communication: Daughter at bedside Disposition Plan: Discharge home likely not for 1 day pending improvement in hypernatremia   Consultants:  None  Procedures:  Modified barium swallow  (1/30)  Antimicrobials: Vancomycin Ceftriaxone Cefepime Flagyl Osteltamivir  Azithromycin   Subjective: No concerns this morning. Some coughing intermittently. Patient is much closer to baseline.  Objective: BP (!) 160/57 (BP Location: Left Arm)   Pulse (!) 53   Temp 97.9 F (36.6 C) (Oral)   Resp 17  Ht 6\' 1"  (1.854 m)   Wt 94.2 kg   SpO2 99%   BMI 27.40 kg/m   Examination:  General exam: Appears calm and comfortable Respiratory system: Mild rhonchi. Respiratory effort normal. Cardiovascular system: S1 & S2 heard, RRR. Gastrointestinal system: Abdomen is nondistended, soft and nontender. Normal bowel sounds heard. Central nervous system: Alert. Musculoskeletal: No edema. No calf tenderness   Data Reviewed: I have personally reviewed following labs and imaging studies  CBC Lab Results  Component Value Date   WBC 5.3 10/01/2023   RBC 3.92 (L) 10/01/2023   HGB 9.6 (L) 10/01/2023   HCT 30.8 (L) 10/01/2023   MCV 78.6 (L) 10/01/2023   MCH 24.5 (L) 10/01/2023   PLT 166 10/01/2023   MCHC 31.2 10/01/2023   RDW 16.7 (H) 10/01/2023   LYMPHSABS 0.9 09/30/2023   MONOABS 0.7 09/30/2023   EOSABS 0.0 09/30/2023   BASOSABS 0.0 09/30/2023     Last metabolic panel Lab Results  Component Value Date   NA 147 (H) 10/03/2023   K 4.0 10/03/2023   CL 121 (H) 10/03/2023   CO2 17 (L) 10/03/2023   BUN 27 (H) 10/03/2023   CREATININE 1.70 (H) 10/03/2023   GLUCOSE 109 (H) 10/03/2023   GFRNONAA 41 (L) 10/03/2023   CALCIUM 9.7 10/03/2023   PHOS 3.3 09/29/2023   PROT 7.1 09/28/2023   ALBUMIN 3.3 (L) 09/28/2023   BILITOT 0.6 09/28/2023   ALKPHOS 62 09/28/2023   AST 15 09/28/2023   ALT 12 09/28/2023   ANIONGAP 9 10/03/2023    GFR: Estimated Creatinine Clearance: 41.8 mL/min (A) (by C-G formula based on SCr of 1.7 mg/dL (H)).  Recent Results (from the past 240 hours)  Resp panel by RT-PCR (RSV, Flu A&B, Covid) Anterior Nasal Swab     Status: Abnormal   Collection  Time: 09/28/23  8:59 AM   Specimen: Anterior Nasal Swab  Result Value Ref Range Status   SARS Coronavirus 2 by RT PCR NEGATIVE NEGATIVE Final   Influenza A by PCR POSITIVE (A) NEGATIVE Final   Influenza B by PCR NEGATIVE NEGATIVE Final    Comment: (NOTE) The Xpert Xpress SARS-CoV-2/FLU/RSV plus assay is intended as an aid in the diagnosis of influenza from Nasopharyngeal swab specimens and should not be used as a sole basis for treatment. Nasal washings and aspirates are unacceptable for Xpert Xpress SARS-CoV-2/FLU/RSV testing.  Fact Sheet for Patients: BloggerCourse.com  Fact Sheet for Healthcare Providers: SeriousBroker.it  This test is not yet approved or cleared by the [place] FDA and has been authorized for detection and/or diagnosis of SARS-CoV-2 by FDA under an Emergency Use Authorization (EUA). This EUA will remain in effect (meaning this test can be used) for the duration of the COVID-19 declaration under Section 564(b)(1) of the Act, 21 U.S.C. section 360bbb-3(b)(1), unless the authorization is terminated or revoked.     Resp Syncytial Virus by PCR NEGATIVE NEGATIVE Final    Comment: (NOTE) Fact Sheet for Patients: BloggerCourse.com  Fact Sheet for Healthcare Providers: SeriousBroker.it  This test is not yet approved or cleared by the [place] FDA and has been authorized for detection and/or diagnosis of SARS-CoV-2 by FDA under an Emergency Use Authorization (EUA). This EUA will remain in effect (meaning this test can be used) for the duration of the COVID-19 declaration under Section 564(b)(1) of the Act, 21 U.S.C. section 360bbb-3(b)(1), unless the authorization is terminated or revoked.  Performed at [facility], [address] [place], [place] [address]  Blood Culture (routine x 2)     Status: None   Collection Time: 09/28/23  9:07 AM    Specimen: BLOOD LEFT ARM  Result Value Ref Range Status   Specimen Description BLOOD LEFT ARM  Final   Special Requests   Final    BOTTLES DRAWN AEROBIC AND ANAEROBIC Blood Culture results may not be optimal due to an inadequate volume of blood received in culture bottles   Culture   Final    NO GROWTH 5 DAYS Performed at [facility], [address] [place], [place] [address]    Report Status 10/03/2023 FINAL  Final  Blood Culture (routine x 2)     Status: None   Collection Time: 09/28/23  9:07 AM   Specimen: BLOOD LEFT ARM  Result Value Ref Range Status   Specimen Description BLOOD LEFT ARM  Final   Special Requests   Final    BOTTLES DRAWN AEROBIC AND ANAEROBIC Blood Culture results may not be optimal due to an inadequate volume of blood received in culture bottles   Culture   Final    NO GROWTH 5 DAYS Performed at [facility], [address], [place], [place] [address]    Report Status 10/03/2023 FINAL  Final  Urine Culture     Status: None   Collection Time: 09/28/23 11:26 AM   Specimen: Urine, Random  Result Value Ref Range Status   Specimen Description URINE, RANDOM  Final   Special Requests NONE Reflexed from [id number]  Final   Culture   Final    NO GROWTH Performed at [facility], [address] [place], [place] [address]    Report Status 09/29/2023 FINAL  Final  Respiratory (~20 pathogens) panel by PCR     Status: Abnormal   Collection Time: 09/29/23 11:06 AM   Specimen: Nasopharyngeal Swab; Respiratory  Result Value Ref Range Status   Adenovirus NOT DETECTED NOT DETECTED Corrected   Coronavirus 229E NOT DETECTED NOT DETECTED Corrected    Comment: (NOTE) The Coronavirus on the Respiratory Panel, DOES NOT test for the novel  Coronavirus (2019 nCoV) CORRECTED ON 01/28 AT 2020: PREVIOUSLY REPORTED AS NOT DETECTED    Coronavirus HKU1 NOT DETECTED NOT DETECTED Corrected   Coronavirus NL63 NOT DETECTED NOT DETECTED Corrected   Coronavirus OC43 NOT  DETECTED NOT DETECTED Corrected   Metapneumovirus NOT DETECTED NOT DETECTED Corrected   Rhinovirus / Enterovirus NOT DETECTED NOT DETECTED Corrected   Influenza A H3 DETECTED (A) NOT DETECTED Corrected   Influenza B NOT DETECTED NOT DETECTED Corrected   Parainfluenza Virus 1 NOT DETECTED NOT DETECTED Corrected   Parainfluenza Virus 2 NOT DETECTED NOT DETECTED Corrected   Parainfluenza Virus 3 NOT DETECTED NOT DETECTED Corrected   Parainfluenza Virus 4 NOT DETECTED NOT DETECTED Corrected   Respiratory Syncytial Virus NOT DETECTED NOT DETECTED Corrected   Bordetella pertussis NOT DETECTED NOT DETECTED Corrected   Bordetella Parapertussis NOT DETECTED NOT DETECTED Corrected   Chlamydophila pneumoniae NOT DETECTED NOT DETECTED Corrected   Mycoplasma pneumoniae NOT DETECTED NOT DETECTED Corrected    Comment: Performed at [facility], [address] [place], [place] [address]      Radiology Studies: DG Swallowing Func-Speech Pathology Result Date: 10/01/2023 Table formatting from the original result was not included. Modified Barium Swallow Study Patient Details Name: Gilbert Wilson MRN: [id number] Date of Birth: 01/13/1946 Today's Date: 10/01/2023 HPI/PMH: HPI: Patient is a 78 y.o. male with PMH: CVA with right sided  hemiplegia, ESRD s/p renal transplant 2016, chronic hep C with cirrhosis, liver failure s/p liver transplant 2016, dementia, seizure disorder. DM-2, HTN, a-fib. He presented to the hospital on 09/28/23 from home with SOB and cough over past two days, fever and one instance of vomiting night before admission. He was recently diagnosed with UTI. In ED, Tmax was 102.3, CXR showing low lung volumes with chronic elevation of right hemidiaphragm, streaky opacities at the medial right lung base could represent atelectasis or infiltrate. Patient was lethargic and not able to follow commands, prompting RN to hold all PO medications and request SLP swallow evaluation. Clinical Impression:  Clinical Impression: Patient presents with an oropharyngeal dysphagia with silent aspiration occuring with thin liquid boluses secondary to mistiming of epiglottic inversion leading to delayed closer of laryngeal vestibule. During oral phase, patient with disorganized lingual movements resulting in delayed anterior to posterior transit of boluses. Swallow was initiated at level of pyriform sinus with barium still remaining in oral cavity when swallow initiated. Anterior hyoid excursion,, epiglottic inversion and laryngeal vestibule closure were all complete/WFL. During the majority of swallows, boluses fully cleared pharynx during initial swallow. Suspected cervical osteophytes (no radiologist present to confirm) did appear to result in trace to minimal amount of barium remaining above PES after initial swallows with liquid boluses. When aspiration occurred, it was with thin liquids and secondary to prolonged delay of swallow initiation, such that barium started to enter open airway before swallow was initiated. Aspiration was sensed at times (PAS 7) but at times silent (PAS 8). Even after silent aspiration, patient did have eventual cough response, though his cough was never effective to clear aspirate. No aspiration observed even with large straw sips of nectar thick liquids. SLP recommending initiate PO diet of Dys 1 (puree) solids, nectar thick liquids and allow plain/thin water in between meals. Factors that may increase risk of adverse event in presence of aspiration Rubye Oaks & Clearance Coots 2021): Factors that may increase risk of adverse event in presence of aspiration Rubye Oaks & Clearance Coots 2021): Poor general health and/or compromised immunity; Frail or deconditioned; Dependence for feeding and/or oral hygiene; Reduced cognitive function; Limited mobility; Weak cough Recommendations/Plan: Swallowing Evaluation Recommendations Swallowing Evaluation Recommendations Recommendations: PO diet PO Diet Recommendation:  Dysphagia 1 (Pureed); Thin liquids (Level 0) Liquid Administration via: Cup; Straw Medication Administration: Crushed with puree Supervision: Full supervision/cueing for swallowing strategies; Full assist for feeding Swallowing strategies  : Slow rate; Small bites/sips Postural changes: Position pt fully upright for meals Oral care recommendations: Oral care BID (2x/day); Staff/trained caregiver to provide oral care Treatment Plan Treatment Plan Treatment recommendations: Therapy as outlined in treatment plan below Follow-up recommendations: Other (comment) (SLP at next venue of care) Functional status assessment: Patient has had a recent decline in their functional status and demonstrates the ability to make significant improvements in function in a reasonable and predictable amount of time. Treatment frequency: Min 2x/week Treatment duration: 1 week Interventions: Aspiration precaution training; Trials of upgraded texture/liquids; Diet toleration management by SLP; Patient/family education Recommendations Recommendations for follow up therapy are one component of a multi-disciplinary discharge planning process, led by the attending physician.  Recommendations may be updated based on patient status, additional functional criteria and insurance authorization. Assessment: Orofacial Exam: Orofacial Exam Oral Cavity: Oral Hygiene: WFL Oral Cavity - Dentition: Dentures, top; Dentures, bottom Orofacial Anatomy: WFL Oral Motor/Sensory Function: Unable to test Anatomy: Anatomy: Suspected cervical osteophytes Boluses Administered: Boluses Administered Boluses Administered: Thin liquids (Level 0); Mildly thick liquids (Level 2, nectar  thick); Puree  Oral Impairment Domain: Oral Impairment Domain Lip Closure: Escape from interlabial space or lateral juncture, no extension beyond vermillion border Tongue control during bolus hold: Not tested Bolus transport/lingual motion: Slow tongue motion; Repetitive/disorganized tongue  motion Oral residue: Residue collection on oral structures Location of oral residue : Tongue Initiation of pharyngeal swallow : Pyriform sinuses  Pharyngeal Impairment Domain: Pharyngeal Impairment Domain Soft palate elevation: No bolus between soft palate (SP)/pharyngeal wall (PW) Laryngeal elevation: Complete superior movement of thyroid cartilage with complete approximation of arytenoids to epiglottic petiole Anterior hyoid excursion: Complete anterior movement Epiglottic movement: Complete inversion Laryngeal vestibule closure: Complete, no air/contrast in laryngeal vestibule Pharyngeal stripping wave : Present - complete Pharyngeal contraction (A/P view only): N/A Pharyngoesophageal segment opening: Complete distension and complete duration, no obstruction of flow Tongue base retraction: No contrast between tongue base and posterior pharyngeal wall (PPW) Pharyngeal residue: Complete pharyngeal clearance Location of pharyngeal residue: N/A  Esophageal Impairment Domain: Esophageal Impairment Domain Esophageal clearance upright position: Esophageal retention Pill: No data recorded Penetration/Aspiration Scale Score: Penetration/Aspiration Scale Score 1.  Material does not enter airway: Mildly thick liquids (Level 2, nectar thick); Puree 7.  Material enters airway, passes BELOW cords and not ejected out despite cough attempt by patient: Thin liquids (Level 0) 8.  Material enters airway, passes BELOW cords without attempt by patient to eject out (silent aspiration) : Thin liquids (Level 0) Compensatory Strategies: Compensatory Strategies Compensatory strategies: Yes Straw: Effective Effective Straw: Thin liquid (Level 0); Mildly thick liquid (Level 2, nectar thick)   General Information: Caregiver present: Yes  Diet Prior to this Study: NPO   Temperature : Normal   Respiratory Status: Increased WOB   Supplemental O2: Nasal cannula   History of Recent Intubation: No  Behavior/Cognition: Alert; Lethargic/Drowsy;  Requires cueing; Confused Self-Feeding Abilities: Dependent for feeding Baseline vocal quality/speech: Not observed Volitional Cough: Unable to elicit Volitional Swallow: Unable to elicit Exam Limitations: Other (comment) (refusal of some boluses) Goal Planning: Prognosis for improved oropharyngeal function: Fair Barriers to Reach Goals: Cognitive deficits; Time post onset; Behavior No data recorded No data recorded Consulted and agree with results and recommendations: Family member/caregiver Pain: Pain Assessment Pain Assessment: Faces Pain Score: 0 Faces Pain Scale: 0 End of Session: Start Time:SLP Start Time (ACUTE ONLY): 0935 Stop Time: SLP Stop Time (ACUTE ONLY): 0950 Time Calculation:SLP Time Calculation (min) (ACUTE ONLY): 15 min Charges: SLP Evaluations $ SLP Speech Visit: 1 Visit SLP Evaluations $Swallowing Treatment: 1 Procedure SLP visit diagnosis: SLP Visit Diagnosis: Dysphagia, oropharyngeal phase (R13.12) Past Medical History: Past Medical History: Diagnosis Date  CVA (cerebral vascular accident) (HCC) 2019  Dementia (HCC)   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

## 2023-10-04 DIAGNOSIS — J09X1 Influenza due to identified novel influenza A virus with pneumonia: Secondary | ICD-10-CM | POA: Diagnosis not present

## 2023-10-04 DIAGNOSIS — D696 Thrombocytopenia, unspecified: Secondary | ICD-10-CM | POA: Insufficient documentation

## 2023-10-04 DIAGNOSIS — M6282 Rhabdomyolysis: Secondary | ICD-10-CM | POA: Insufficient documentation

## 2023-10-04 DIAGNOSIS — J189 Pneumonia, unspecified organism: Secondary | ICD-10-CM | POA: Insufficient documentation

## 2023-10-04 LAB — BASIC METABOLIC PANEL
Anion gap: 7 (ref 5–15)
BUN: 21 mg/dL (ref 8–23)
CO2: 19 mmol/L — ABNORMAL LOW (ref 22–32)
Calcium: 9.4 mg/dL (ref 8.9–10.3)
Chloride: 116 mmol/L — ABNORMAL HIGH (ref 98–111)
Creatinine, Ser: 1.57 mg/dL — ABNORMAL HIGH (ref 0.61–1.24)
GFR, Estimated: 45 mL/min — ABNORMAL LOW (ref >60)
Glucose, Bld: 145 mg/dL — ABNORMAL HIGH (ref 70–99)
Potassium: 3.7 mmol/L (ref 3.5–5.1)
Sodium: 142 mmol/L (ref 135–145)

## 2023-10-04 LAB — GLUCOSE, CAPILLARY
Glucose-Capillary: 130 mg/dL — ABNORMAL HIGH (ref 70–99)
Glucose-Capillary: 136 mg/dL — ABNORMAL HIGH (ref 70–99)
Glucose-Capillary: 145 mg/dL — ABNORMAL HIGH (ref 70–99)
Glucose-Capillary: 148 mg/dL — ABNORMAL HIGH (ref 70–99)

## 2023-10-04 MED ORDER — CEFDINIR 300 MG PO CAPS: 300.0000 mg | ORAL_CAPSULE | Freq: Two times a day (BID) | ORAL | 0 refills | Status: AC

## 2023-10-04 MED ORDER — INSULIN ASPART 100 UNIT/ML IJ SOLN: 0.0000 [IU] | Freq: Three times a day (TID) | INTRAMUSCULAR | Status: DC

## 2023-10-04 MED ORDER — AZITHROMYCIN 500 MG PO TABS: 500.0000 mg | ORAL_TABLET | Freq: Every day | ORAL | 0 refills | Status: AC

## 2023-10-04 MED ORDER — GUAIFENESIN ER 600 MG PO TB12: 600.0000 mg | ORAL_TABLET | Freq: Two times a day (BID) | ORAL | 0 refills | Status: AC

## 2023-10-04 NOTE — Plan of Care (Signed)
  Problem: Metabolic: Goal: Ability to maintain appropriate glucose levels will improve Outcome: Progressing   Problem: Nutritional: Goal: Maintenance of adequate nutrition will improve Outcome: Progressing   Problem: Skin Integrity: Goal: Risk for impaired skin integrity will decrease Outcome: Progressing   Problem: Clinical Measurements: Goal: Ability to maintain clinical measurements within normal limits will improve Outcome: Progressing   Problem: Pain Managment: Goal: General experience of comfort will improve and/or be controlled Outcome: Progressing   Problem: Safety: Goal: Ability to remain free from injury will improve Outcome: Progressing

## 2023-10-04 NOTE — Plan of Care (Signed)
  Problem: Education: Goal: Ability to describe self-care measures that may prevent or decrease complications (Diabetes Survival Skills Education) will improve Outcome: Completed/Met Goal: Individualized Educational Video(s) Outcome: Completed/Met   Problem: Fluid Volume: Goal: Ability to maintain a balanced intake and output will improve Outcome: Completed/Met   Problem: Health Behavior/Discharge Planning: Goal: Ability to identify and utilize available resources and services will improve Outcome: Completed/Met Goal: Ability to manage health-related needs will improve Outcome: Completed/Met   Problem: Nutritional: Goal: Maintenance of adequate nutrition will improve Outcome: Completed/Met Goal: Progress toward achieving an optimal weight will improve Outcome: Completed/Met   Problem: Metabolic: Goal: Ability to maintain appropriate glucose levels will improve Outcome: Completed/Met

## 2023-10-04 NOTE — Discharge Summary (Signed)
Physician Discharge Summary   Patient: Gilbert Wilson MRN: [id number] DOB: 1946-02-21  Admit date:     09/28/2023  Discharge date: 10/04/23  Discharge Physician: Jacquelin Hawking, MD   PCP: [facility]   Recommendations at discharge:  PCP visit for hospital follow-up  Discharge Diagnoses: Principal Problem:   Influenza A with pneumonia Active Problems:   Essential hypertension   Type 2 diabetes mellitus (HCC)   Atrial fibrillation (HCC)   Dysphagia, unspecified   Thrombocytopenia (HCC)   Rhabdomyolysis   Community acquired pneumonia  Resolved Problems:   * No resolved hospital problems. *  Hospital Course: Gilbert Wilson is a 78 y.o. male with a history of hypertension, insulin-dependent diabetes mellitus type 2, atrial fibrillation status post ablation, CVA with right-sided hemiplegia, ESRD s/p renal transplant, CKD, chronic hepatitis C with cirrhosis, liver failure status post liver transplant, dementia, seizure disorder, BPH.  Patient presented secondary to shortness of breath and fever and was found to have evidence of sepsis secondary to influenza A infection.  Patient was also diagnosed with UTI and was treated with ceftriaxone.  Hospitalization complicated by associated acute metabolic encephalopathy, acute respiratory failure with hypoxia secondary to acute illness and hypernatremia.  Started and completed treatment of influenza A infection. Patient discharged with antibiotics to complete treatment for possible community acquired pneumonia.  Assessment and Plan:  Sepsis Present on admission. Secondary to influenza A infection. Blood cultures with no growth to date.   Influenza A infection No clear infiltrates on chest imaging. Patient started on Tamiflu and completed course.   Possible community acquired pneumonia Patient with hypoxia and elevated procalcitonin. Patient started on Ceftriaxone for UTI. -Continue Ceftriaxone and azithromycin   Acute metabolic  encephalopathy Secondary to infection.  Complicated by underlying dementia. Improved with ongoing treatment.   Acute respiratory failure with hypoxia Associated tachypnea. Secondary to acute illness. Initial x-ray without overt infectious etiology. Weaned to room air.   Dysphagia Speech therapy consulted. SLP reevaluated and performed a MBS on 1/30. SLP recommendations (2/1): Diet recommendations: Dysphagia 2 (fine chop);Nectar-thick liquid Liquids provided via: Cup;Straw Medication Administration: Crushed with puree Supervision: Staff to assist with self feeding;Full supervision/cueing for compensatory strategies;Trained caregiver to feed patient Compensations: Minimize environmental distractions;Slow rate;Small sips/bites Postural Changes and/or Swallow Maneuvers: Seated upright 90 degrees   Rhabdomyolysis Unclear if traumatic vs nontraumatic. Patient is on Crestor which has interaction with cyclosporine; Crestor held. CK of 1,708 on admission. Improved to 807.   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 Currently, creatinine is down to 1.70   Metabolic acidosis In setting of underlying renal disease. Sodium bicarbonate tablets held on admission secondary to NPO status. Managed with sodium bicarb IV infusion and transition to sodium bicarb tablets. Continue home sodium bicarb tablets.   Hypernatremia Associated hyperchloremia. Likely secondary to normal saline IV fluids and lack of oral intake. Sodium has improved slightly to 147. Sodium improved to 142 with IV fluids and improved oral intake.   Diabetes mellitus, type 2 Controlled. Hemoglobin A1C of 6.4%.   Thrombocytopenia Acute. Likely reactive secondary to acute illness. Resolved.   Chronic microcytic anemia Likely, in part, related to chronic kidney disease in addition to likely iron deficiency anemia. -Consider iron supplementation once acute illness has improved    Primary hypertension Patient is on losartan, amlodipine and hydralazine as an outpatient. Amlodipine and hydralazine initially resumed, but now discontinued. Normotensive to slightly hypertensive.   Atrial fibrillation, unspecified Sinus rhythm. History of  ablation. Patient is not on anticoagulation.    Consultants:  None   Procedures:  Modified barium swallow (1/30)   Disposition: Home Diet recommendation: Dysphagia type 2 nectar thickened liquid   DISCHARGE MEDICATION: Allergies as of 10/04/2023       Reactions   Levaquin [levofloxacin] Itching   Linezolid    Seizures   Lisinopril Cough        Medication List     PAUSE taking these medications    omeprazole 20 MG capsule Wait to take this until your doctor or other care provider tells you to start again. Commonly known as: PRILOSEC Take 20 mg by mouth daily as needed.       STOP taking these medications    amoxicillin 500 MG tablet Commonly known as: AMOXIL       TAKE these medications    acetaminophen 500 MG tablet Commonly known as: TYLENOL Take 1,000 mg by mouth every 6 (six) hours as needed for moderate pain, headache or fever.   albuterol 108 (90 Base) MCG/ACT inhaler Commonly known as: VENTOLIN HFA Inhale 2 puffs into the lungs every 6 (six) hours as needed for wheezing or shortness of breath.   amLODipine 10 MG tablet Commonly known as: NORVASC Take 10 mg by mouth daily.   ascorbic acid 250 MG tablet Commonly known as: VITAMIN C Take 250 mg by mouth daily. Take 1 tablet by mouth with the 325mg  iron tablet.   azithromycin 500 MG tablet Commonly known as: ZITHROMAX Take 1 tablet (500 mg total) by mouth daily for 1 dose. Start taking on: October 05, 2023   calcitRIOL 0.25 MCG capsule Commonly known as: ROCALTROL Take 0.25 mcg by mouth daily.   cefdinir 300 MG capsule Commonly known as: OMNICEF Take 1 capsule (300 mg total) by mouth 2 (two) times daily for 1 day.   clopidogrel 75  MG tablet Commonly known as: PLAVIX Take 75 mg by mouth daily.   cycloSPORINE modified 100 MG capsule Commonly known as: NEORAL Take 150 mg by mouth 2 (two) times daily.   Ensure Take 1 Can by mouth daily as needed.   epoetin alfa [address] UNIT/ML injection Commonly known as: EPOGEN Inject 10,000 Units into the skin every 14 (fourteen) days.   ferrous sulfate 325 (65 FE) MG EC tablet Take 325 mg by mouth daily. Take 1 tablet by mouth with the 250mg  vitamin c tablet.   finasteride 5 MG tablet Commonly known as: PROSCAR Take 5 mg by mouth every evening.   furosemide 20 MG tablet Commonly known as: LASIX Take 20 mg by mouth every other day.   guaiFENesin 600 MG 12 hr tablet Commonly known as: MUCINEX Take 1 tablet (600 mg total) by mouth 2 (two) times daily for 5 days.   hydrALAZINE 50 MG tablet Commonly known as: APRESOLINE Take 1 tablet (50 mg total) by mouth 3 (three) times daily. What changed:  how much to take when to take this   Lantus SoloStar 100 UNIT/ML Solostar Pen Generic drug: insulin glargine Inject 0-6 Units into the skin at bedtime. If BS>130-Take 6 units, Increase 1 unit for every 10 mg/dl above 604.   levETIRAcetam 750 MG tablet Commonly known as: KEPPRA Take 1 tablet (750 mg total) by mouth 2 (two) times daily.   losartan 50 MG tablet Commonly known as: COZAAR Take 50 mg by mouth in the morning and at bedtime.   MAGNESIUM OXIDE 400 PO Take 400 mg by mouth daily.   mirtazapine 15  MG tablet Commonly known as: REMERON Take 0.5 tablets (7.5 mg total) by mouth at bedtime. What changed: how much to take   mycophenolate 180 MG EC tablet Commonly known as: MYFORTIC Take 180 mg by mouth 2 (two) times daily.   rosuvastatin 5 MG tablet Commonly known as: CRESTOR Take 5 mg by mouth every evening.   sodium bicarbonate 650 MG tablet Take 1,300 mg by mouth 2 (two) times daily.   tamsulosin 0.4 MG Caps capsule Commonly known as: FLOMAX Take 0.4 mg by  mouth daily. Take 1 tablet by mouth with vitamin d 2000units.   Vitamin D2 50 MCG (2000 UT) Tabs Take 1 tablet by mouth daily. Take 1 tablet by mouth with 0.4mg  of Tamsulosin.        Follow-up Information     [facility] Schedule an appointment as soon as possible for a visit in 1 week(s).   Why: For hospital follow-up Contact information: [address] [facility] [place] [address] [phone]                Discharge Exam: BP (!) 176/58 (BP Location: Left Arm)   Pulse (!) 58   Temp 98.7 F (37.1 C) (Oral)   Resp 18   Ht 6\' 1"  (1.854 m)   Wt 94.2 kg   SpO2 100%   BMI 27.40 kg/m   General exam: Appears calm and comfortable Respiratory system: Mild rhonchi. Respiratory effort normal. Cardiovascular system: S1 & S2 heard, RRR. No murmurs. Gastrointestinal system: Abdomen is nondistended, soft and nontender. Normal bowel sounds heard. Central nervous system: Alert  Condition at discharge: stable  The results of significant diagnostics from this hospitalization (including imaging, microbiology, ancillary and laboratory) are listed below for reference.   Imaging Studies: DG Swallowing Func-Speech Pathology Result Date: 10/01/2023 Table formatting from the original result was not included. Modified Barium Swallow Study Patient Details Name: Raihan Kimmel MRN: [id number] Date of Birth: [birth year] Today's Date: 10/01/2023 HPI/PMH: HPI: Patient is a 78 y.o. male with PMH: CVA with right sided hemiplegia, ESRD s/p renal transplant 2016, chronic hep C with cirrhosis, liver failure s/p liver transplant 2016, dementia, seizure disorder. DM-2, HTN, a-fib. He presented to the hospital on 09/28/23 from home with SOB and cough over past two days, fever and one instance of vomiting night before admission. He was recently diagnosed with UTI. In ED, Tmax was 102.3, CXR showing low lung volumes with chronic elevation of right hemidiaphragm, streaky opacities at  the medial right lung base could represent atelectasis or infiltrate. Patient was lethargic and not able to follow commands, prompting RN to hold all PO medications and request SLP swallow evaluation. Clinical Impression: Clinical Impression: Patient presents with an oropharyngeal dysphagia with silent aspiration occuring with thin liquid boluses secondary to mistiming of epiglottic inversion leading to delayed closer of laryngeal vestibule. During oral phase, patient with disorganized lingual movements resulting in delayed anterior to posterior transit of boluses. Swallow was initiated at level of pyriform sinus with barium still remaining in oral cavity when swallow initiated. Anterior hyoid excursion,, epiglottic inversion and laryngeal vestibule closure were all complete/WFL. During the majority of swallows, boluses fully cleared pharynx during initial swallow. Suspected cervical osteophytes (no radiologist present to confirm) did appear to result in trace to minimal amount of barium remaining above PES after initial swallows with liquid boluses. When aspiration occurred, it was with thin liquids and secondary to prolonged delay of swallow initiation, such that barium started to enter open airway before swallow  was initiated. Aspiration was sensed at times (PAS 7) but at times silent (PAS 8). Even after silent aspiration, patient did have eventual cough response, though his cough was never effective to clear aspirate. No aspiration observed even with large straw sips of nectar thick liquids. SLP recommending initiate PO diet of Dys 1 (puree) solids, nectar thick liquids and allow plain/thin water in between meals. Factors that may increase risk of adverse event in presence of aspiration Rubye Oaks [employer] Clearance Coots 2021): Factors that may increase risk of adverse event in presence of aspiration Rubye Oaks [employer] Clearance Coots 2021): Poor general health and/or compromised immunity; Frail or deconditioned; Dependence for feeding and/or  oral hygiene; Reduced cognitive function; Limited mobility; Weak cough Recommendations/Plan: Swallowing Evaluation Recommendations Swallowing Evaluation Recommendations Recommendations: PO diet PO Diet Recommendation: Dysphagia 1 (Pureed); Thin liquids (Level 0) Liquid Administration via: Cup; Straw Medication Administration: Crushed with puree Supervision: Full supervision/cueing for swallowing strategies; Full assist for feeding Swallowing strategies  : Slow rate; Small bites/sips Postural changes: Position pt fully upright for meals Oral care recommendations: Oral care BID (2x/day); Staff/trained caregiver to provide oral care Treatment Plan Treatment Plan Treatment recommendations: Therapy as outlined in treatment plan below Follow-up recommendations: Other (comment) (SLP at next venue of care) Functional status assessment: Patient has had a recent decline in their functional status and demonstrates the ability to make significant improvements in function in a reasonable and predictable amount of time. Treatment frequency: Min 2x/week Treatment duration: 1 week Interventions: Aspiration precaution training; Trials of upgraded texture/liquids; Diet toleration management by SLP; Patient/family education Recommendations Recommendations for follow up therapy are one component of a multi-disciplinary discharge planning process, led by the attending physician.  Recommendations may be updated based on patient status, additional functional criteria and insurance authorization. Assessment: Orofacial Exam: Orofacial Exam Oral Cavity: Oral Hygiene: WFL Oral Cavity - Dentition: Dentures, top; Dentures, bottom Orofacial Anatomy: WFL Oral Motor/Sensory Function: Unable to test Anatomy: Anatomy: Suspected cervical osteophytes Boluses Administered: Boluses Administered Boluses Administered: Thin liquids (Level 0); Mildly thick liquids (Level 2, nectar thick); Puree  Oral Impairment Domain: Oral Impairment Domain Lip Closure:  Escape from interlabial space or lateral juncture, no extension beyond vermillion border Tongue control during bolus hold: Not tested Bolus transport/lingual motion: Slow tongue motion; Repetitive/disorganized tongue motion Oral residue: Residue collection on oral structures Location of oral residue : Tongue Initiation of pharyngeal swallow : Pyriform sinuses  Pharyngeal Impairment Domain: Pharyngeal Impairment Domain Soft palate elevation: No bolus between soft palate (SP)/pharyngeal wall (PW) Laryngeal elevation: Complete superior movement of thyroid cartilage with complete approximation of arytenoids to epiglottic petiole Anterior hyoid excursion: Complete anterior movement Epiglottic movement: Complete inversion Laryngeal vestibule closure: Complete, no air/contrast in laryngeal vestibule Pharyngeal stripping wave : Present - complete Pharyngeal contraction (A/P view only): N/A Pharyngoesophageal segment opening: Complete distension and complete duration, no obstruction of flow Tongue base retraction: No contrast between tongue base and posterior pharyngeal wall (PPW) Pharyngeal residue: Complete pharyngeal clearance Location of pharyngeal residue: N/A  Esophageal Impairment Domain: Esophageal Impairment Domain Esophageal clearance upright position: Esophageal retention Pill: No data recorded Penetration/Aspiration Scale Score: Penetration/Aspiration Scale Score 1.  Material does not enter airway: Mildly thick liquids (Level 2, nectar thick); Puree 7.  Material enters airway, passes BELOW cords and not ejected out despite cough attempt by patient: Thin liquids (Level 0) 8.  Material enters airway, passes BELOW cords without attempt by patient to eject out (silent aspiration) : Thin liquids (Level 0) Compensatory Strategies: Compensatory Strategies Compensatory strategies: Yes Straw:  Effective Effective Straw: Thin liquid (Level 0); Mildly thick liquid (Level 2, nectar thick)   General Information: Caregiver  present: Yes  Diet Prior to this Study: NPO   Temperature : Normal   Respiratory Status: Increased WOB   Supplemental O2: Nasal cannula   History of Recent Intubation: No  Behavior/Cognition: Alert; Lethargic/Drowsy; Requires cueing; Confused Self-Feeding Abilities: Dependent for feeding Baseline vocal quality/speech: Not observed Volitional Cough: Unable to elicit Volitional Swallow: Unable to elicit Exam Limitations: Other (comment) (refusal of some boluses) Goal Planning: Prognosis for improved oropharyngeal function: Fair Barriers to Reach Goals: Cognitive deficits; Time post onset; Behavior No data recorded No data recorded Consulted and agree with results and recommendations: Family member/caregiver Pain: Pain Assessment Pain Assessment: Faces Pain Score: 0 Faces Pain Scale: 0 End of Session: Start Time:SLP Start Time (ACUTE ONLY): 0935 Stop Time: SLP Stop Time (ACUTE ONLY): 0950 Time Calculation:SLP Time Calculation (min) (ACUTE ONLY): 15 min Charges: SLP Evaluations $ SLP Speech Visit: 1 Visit SLP Evaluations $Swallowing Treatment: 1 Procedure SLP visit diagnosis: SLP Visit Diagnosis: Dysphagia, oropharyngeal phase (R13.12) Past Medical History: Past Medical History: Diagnosis Date  CVA (cerebral vascular accident) (HCC) 2019  Dementia (HCC)   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 Aortic atherosclerosis. Pulmonary vascular congestion. Low lung volumes  with elevation of the right hemidiaphragm. Increasing streaky perihilar opacities. No pleural effusion or pneumothorax. IMPRESSION: Low lung volumes with pulmonary vascular congestion and increasing streaky perihilar opacities, which may reflect edema or atelectasis. Electronically Signed   By: Duanne Guess D.O.   On: 09/30/2023 15:23   DG Chest Port 1 View Result Date: 09/28/2023 CLINICAL DATA:  Possible syncopal episode. EXAM: PORTABLE CHEST 1 VIEW COMPARISON:  Chest radiograph dated Jan 01, 2022. FINDINGS: Low lung volumes. Stable cardiomegaly. Chronic elevation of the right hemidiaphragm. Streaky opacities at the medial right lung base. The left lung appears clear. No sizable pleural effusion or pneumothorax. No acute osseous abnormality identified. IMPRESSION: 1. Low lung volumes with chronic elevation of the right hemidiaphragm. Streaky opacities at the medial right lung base could represent atelectasis or infiltrate. 2. Stable cardiomegaly. Electronically Signed   By: Hart Robinsons M.D.   On: 09/28/2023 09:40    Microbiology: Results for orders placed or performed during the hospital encounter of 09/28/23  Resp panel by RT-PCR (RSV, Flu A&B, Covid) Anterior Nasal Swab     Status: Abnormal   Collection Time: 09/28/23  8:59 AM   Specimen: Anterior Nasal Swab  Result Value Ref Range Status   SARS Coronavirus 2 by RT PCR NEGATIVE NEGATIVE Final   Influenza A by PCR POSITIVE (A) NEGATIVE Final   Influenza B by PCR NEGATIVE NEGATIVE Final    Comment: (NOTE) The Xpert Xpress SARS-CoV-2/FLU/RSV plus assay is intended as an aid in the diagnosis of influenza from Nasopharyngeal swab specimens and should not be used as a sole basis for treatment. Nasal washings and  aspirates are unacceptable for Xpert Xpress SARS-CoV-2/FLU/RSV testing.  Fact Sheet for Patients: BloggerCourse.com  Fact Sheet for Healthcare Providers: SeriousBroker.it  This  test is not yet approved or cleared by the [place] FDA and has been authorized for detection and/or diagnosis of SARS-CoV-2 by FDA under an Emergency Use Authorization (EUA). This EUA will remain in effect (meaning this test can be used) for the duration of the COVID-19 declaration under Section 564(b)(1) of the Act, 21 U.S.C. section 360bbb-3(b)(1), unless the authorization is terminated or revoked.     Resp Syncytial Virus by PCR NEGATIVE NEGATIVE Final    Comment: (NOTE) Fact Sheet for Patients: BloggerCourse.com  Fact Sheet for Healthcare Providers: SeriousBroker.it  This test is not yet approved or cleared by the [place] FDA and has been authorized for detection and/or diagnosis of SARS-CoV-2 by FDA under an Emergency Use Authorization (EUA). This EUA will remain in effect (meaning this test can be used) for the duration of the COVID-19 declaration under Section 564(b)(1) of the Act, 21 U.S.C. section 360bbb-3(b)(1), unless the authorization is terminated or revoked.  Performed at [facility], [address] [place], [place] [address]   Blood Culture (routine x 2)     Status: None   Collection Time: 09/28/23  9:07 AM   Specimen: BLOOD LEFT ARM  Result Value Ref Range Status   Specimen Description BLOOD LEFT ARM  Final   Special Requests   Final    BOTTLES DRAWN AEROBIC AND ANAEROBIC Blood Culture results may not be optimal due to an inadequate volume of blood received in culture bottles   Culture   Final    NO GROWTH 5 DAYS Performed at [facility], [address], [place], [place] [address]    Report Status 10/03/2023 FINAL  Final  Blood Culture (routine x 2)     Status: None   Collection Time: 09/28/23  9:07 AM   Specimen: BLOOD LEFT ARM  Result Value Ref Range Status   Specimen Description BLOOD LEFT ARM  Final   Special Requests   Final    BOTTLES DRAWN AEROBIC AND ANAEROBIC Blood  Culture results may not be optimal due to an inadequate volume of blood received in culture bottles   Culture   Final    NO GROWTH 5 DAYS Performed at [facility], [address], [place], [place] [address]    Report Status 10/03/2023 FINAL  Final  Urine Culture     Status: None   Collection Time: 09/28/23 11:26 AM   Specimen: Urine, Random  Result Value Ref Range Status   Specimen Description URINE, RANDOM  Final   Special Requests NONE Reflexed from [id number]  Final   Culture   Final    NO GROWTH Performed at [facility], [address] [place], [place] [address]    Report Status 09/29/2023 FINAL  Final  Respiratory (~20 pathogens) panel by PCR     Status: Abnormal   Collection Time: 09/29/23 11:06 AM   Specimen: Nasopharyngeal Swab; Respiratory  Result Value Ref Range Status   Adenovirus NOT DETECTED NOT DETECTED Corrected   Coronavirus 229E NOT DETECTED NOT DETECTED Corrected    Comment: (NOTE) The Coronavirus on the Respiratory Panel, DOES NOT test for the novel  Coronavirus (2019 nCoV) CORRECTED ON 01/28 AT 2020: PREVIOUSLY REPORTED AS NOT DETECTED    Coronavirus HKU1 NOT DETECTED NOT DETECTED Corrected   Coronavirus NL63 NOT DETECTED NOT DETECTED Corrected  Coronavirus OC43 NOT DETECTED NOT DETECTED Corrected   Metapneumovirus NOT DETECTED NOT DETECTED Corrected   Rhinovirus / Enterovirus NOT DETECTED NOT DETECTED Corrected   Influenza A H3 DETECTED (A) NOT DETECTED Corrected   Influenza B NOT DETECTED NOT DETECTED Corrected   Parainfluenza Virus 1 NOT DETECTED NOT DETECTED Corrected   Parainfluenza Virus 2 NOT DETECTED NOT DETECTED Corrected   Parainfluenza Virus 3 NOT DETECTED NOT DETECTED Corrected   Parainfluenza Virus 4 NOT DETECTED NOT DETECTED Corrected   Respiratory Syncytial Virus NOT DETECTED NOT DETECTED Corrected   Bordetella pertussis NOT DETECTED NOT DETECTED Corrected   Bordetella Parapertussis NOT DETECTED NOT DETECTED Corrected    Chlamydophila pneumoniae NOT DETECTED NOT DETECTED Corrected   Mycoplasma pneumoniae NOT DETECTED NOT DETECTED Corrected    Comment: Performed at [facility], [address], [place], [place] [address]    Labs: CBC: Recent Labs  Lab 09/28/23 [phone] 09/29/23 [phone] 09/30/23 0734 10/01/23 0537  WBC 8.9 8.4 8.4 5.3  NEUTROABS 7.4  --  6.8  --   HGB 10.2* 8.7* 8.6* 9.6*  HCT 32.2* 27.2* 27.9* 30.8*  MCV 78.2* 75.8* 78.8* 78.6*  PLT 191 128* 117* 166   Basic Metabolic Panel: Recent Labs  Lab 09/28/23 0907 09/29/23 5638 09/30/23 0734 10/01/23 0537 10/02/23 0638 10/03/23 0615 10/04/23 0813  NA 142 144 146* 148* 148* 147* 142  K 4.1 4.1 4.2 3.9 3.8 4.0 3.7  CL 110 115* 117* 123* 120* 121* 116*  CO2 20* 19* 14* 17* 18* 17* 19*  GLUCOSE 156* 101* 108* 67* 99 109* 145*  BUN 48* 47* 48* 41* 31* 27* 21  CREATININE 2.43* 2.36* 2.49* 2.13* 1.89* 1.70* 1.57*  CALCIUM 10.3 9.3 9.1 9.2 9.4 9.7 9.4  MG 2.1 1.9  --   --   --   --   --   PHOS 2.9 3.3  --   --   --   --   --    Liver Function Tests: Recent Labs  Lab 09/28/23 0907  AST 15  ALT 12  ALKPHOS 62  BILITOT 0.6  PROT 7.1  ALBUMIN 3.3*   CBG: Recent Labs  Lab 10/03/23 1655 10/03/23 2027 10/04/23 0000 10/04/23 0443 10/04/23 0922  GLUCAP 135* 143* 130* 136* 145*    Discharge time spent: 35 minutes.  Signed: Jacquelin Hawking, MD [facility] 10/04/2023

## 2023-10-04 NOTE — Discharge Instructions (Signed)
Gilbert Wilson,  You were in the hospital with an influenza infection. You also may have had a pneumonia from a bacteria, in addition to a urine infection. You have improved with antibiotics and supportive care. Your hospitalization was complicated by high sodium from decreased oral intake which improved with IV fluids. Please stay well hydrated when you go home. Please follow-up with your PCP.

## 2023-10-04 NOTE — TOC Transition Note (Signed)
Transition of Care [facility]) - Discharge Note   Patient Details  Name: Gilbert Wilson MRN: [id number] Date of Birth: 02-Mar-1946  [facility] CM/SW Contact:  Ronny Bacon, RN Phone Number: 10/04/2023, 12:33 PM   Clinical Narrative:  Patient is being discharged today. Secure message from provider and floor nurse that patient will need PTAR for transportation home. PTAR forms filled out and sent to unit. Call to PTAR to arranged transportation home.     Final next level of care: Home/Self Care Barriers to Discharge: No Barriers Identified   Patient Goals and CMS Choice            Discharge Placement                       Discharge Plan and Services Additional resources added to the After Visit Summary for     Discharge Planning Services: CM Consult Post Acute Care Choice: NA          DME Arranged: N/A         HH Arranged: NA          Social Drivers of Health (SDOH) Interventions SDOH Screenings   Food Insecurity: No Food Insecurity (09/28/2023)  Housing: Low Risk  (09/28/2023)  Transportation Needs: No Transportation Needs (09/28/2023)  Utilities: Not At Risk (09/28/2023)  Social Connections: Socially Isolated (09/28/2023)  Tobacco Use: Medium Risk (09/28/2023)     Readmission Risk Interventions    11/27/2021   10:20 AM  Readmission Risk Prevention Plan  Transportation Screening Complete  PCP or Specialist Appt within 3-5 Days Complete  HRI or Home Care Consult Complete  Social Work Consult for Recovery Care Planning/Counseling Complete  Palliative Care Screening Not Applicable  Medication Review Oceanographer) Complete

## 2023-11-20 ENCOUNTER — Emergency Department (HOSPITAL_COMMUNITY)

## 2023-11-20 ENCOUNTER — Inpatient Hospital Stay (HOSPITAL_COMMUNITY)
Admission: EM | Admit: 2023-11-20 | Discharge: 2023-12-03 | DRG: 193 | Disposition: A | Discharge status: Home / Self Care | Attending: Internal Medicine | Admitting: Internal Medicine

## 2023-11-20 ENCOUNTER — Other Ambulatory Visit: Payer: Self-pay

## 2023-11-20 DIAGNOSIS — Y92009 Unspecified place in unspecified non-institutional (private) residence as the place of occurrence of the external cause: Secondary | ICD-10-CM

## 2023-11-20 DIAGNOSIS — J9621 Acute and chronic respiratory failure with hypoxia: Secondary | ICD-10-CM | POA: Diagnosis present

## 2023-11-20 DIAGNOSIS — R569 Unspecified convulsions: Secondary | ICD-10-CM

## 2023-11-20 DIAGNOSIS — N179 Acute kidney failure, unspecified: Secondary | ICD-10-CM | POA: Diagnosis present

## 2023-11-20 DIAGNOSIS — S20411A Abrasion of right back wall of thorax, initial encounter: Secondary | ICD-10-CM | POA: Diagnosis not present

## 2023-11-20 DIAGNOSIS — J9811 Atelectasis: Secondary | ICD-10-CM | POA: Diagnosis present

## 2023-11-20 DIAGNOSIS — E870 Hyperosmolality and hypernatremia: Secondary | ICD-10-CM | POA: Diagnosis present

## 2023-11-20 DIAGNOSIS — K7460 Unspecified cirrhosis of liver: Secondary | ICD-10-CM | POA: Diagnosis present

## 2023-11-20 DIAGNOSIS — I959 Hypotension, unspecified: Secondary | ICD-10-CM | POA: Diagnosis present

## 2023-11-20 DIAGNOSIS — E8722 Chronic metabolic acidosis: Secondary | ICD-10-CM | POA: Diagnosis present

## 2023-11-20 DIAGNOSIS — I080 Rheumatic disorders of both mitral and aortic valves: Secondary | ICD-10-CM | POA: Diagnosis present

## 2023-11-20 DIAGNOSIS — Z79621 Long term (current) use of calcineurin inhibitor: Secondary | ICD-10-CM

## 2023-11-20 DIAGNOSIS — Z794 Long term (current) use of insulin: Secondary | ICD-10-CM

## 2023-11-20 DIAGNOSIS — I700 Atherosclerosis of aorta: Secondary | ICD-10-CM | POA: Diagnosis present

## 2023-11-20 DIAGNOSIS — Z7401 Bed confinement status: Secondary | ICD-10-CM

## 2023-11-20 DIAGNOSIS — D84821 Immunodeficiency due to drugs: Secondary | ICD-10-CM | POA: Diagnosis present

## 2023-11-20 DIAGNOSIS — I825Y2 Chronic embolism and thrombosis of unspecified deep veins of left proximal lower extremity: Secondary | ICD-10-CM | POA: Diagnosis present

## 2023-11-20 DIAGNOSIS — I69351 Hemiplegia and hemiparesis following cerebral infarction affecting right dominant side: Secondary | ICD-10-CM

## 2023-11-20 DIAGNOSIS — Y830 Surgical operation with transplant of whole organ as the cause of abnormal reaction of the patient, or of later complication, without mention of misadventure at the time of the procedure: Secondary | ICD-10-CM | POA: Diagnosis present

## 2023-11-20 DIAGNOSIS — E1122 Type 2 diabetes mellitus with diabetic chronic kidney disease: Secondary | ICD-10-CM | POA: Diagnosis present

## 2023-11-20 DIAGNOSIS — L89151 Pressure ulcer of sacral region, stage 1: Secondary | ICD-10-CM | POA: Diagnosis not present

## 2023-11-20 DIAGNOSIS — B952 Enterococcus as the cause of diseases classified elsewhere: Secondary | ICD-10-CM | POA: Diagnosis present

## 2023-11-20 DIAGNOSIS — G464 Cerebellar stroke syndrome: Secondary | ICD-10-CM | POA: Diagnosis present

## 2023-11-20 DIAGNOSIS — Z881 Allergy status to other antibiotic agents status: Secondary | ICD-10-CM

## 2023-11-20 DIAGNOSIS — N390 Urinary tract infection, site not specified: Secondary | ICD-10-CM | POA: Diagnosis present

## 2023-11-20 DIAGNOSIS — F03C0 Unspecified dementia, severe, without behavioral disturbance, psychotic disturbance, mood disturbance, and anxiety: Secondary | ICD-10-CM | POA: Diagnosis present

## 2023-11-20 DIAGNOSIS — Z87891 Personal history of nicotine dependence: Secondary | ICD-10-CM

## 2023-11-20 DIAGNOSIS — J189 Pneumonia, unspecified organism: Principal | ICD-10-CM | POA: Diagnosis present

## 2023-11-20 DIAGNOSIS — E871 Hypo-osmolality and hyponatremia: Secondary | ICD-10-CM | POA: Diagnosis present

## 2023-11-20 DIAGNOSIS — Z833 Family history of diabetes mellitus: Secondary | ICD-10-CM

## 2023-11-20 DIAGNOSIS — R338 Other retention of urine: Secondary | ICD-10-CM | POA: Diagnosis present

## 2023-11-20 DIAGNOSIS — I82512 Chronic embolism and thrombosis of left femoral vein: Secondary | ICD-10-CM | POA: Diagnosis present

## 2023-11-20 DIAGNOSIS — Z7902 Long term (current) use of antithrombotics/antiplatelets: Secondary | ICD-10-CM

## 2023-11-20 DIAGNOSIS — G8190 Hemiplegia, unspecified affecting unspecified side: Secondary | ICD-10-CM

## 2023-11-20 DIAGNOSIS — T8619 Other complication of kidney transplant: Secondary | ICD-10-CM | POA: Diagnosis present

## 2023-11-20 DIAGNOSIS — N186 End stage renal disease: Secondary | ICD-10-CM | POA: Diagnosis present

## 2023-11-20 DIAGNOSIS — X58XXXA Exposure to other specified factors, initial encounter: Secondary | ICD-10-CM | POA: Diagnosis not present

## 2023-11-20 DIAGNOSIS — E875 Hyperkalemia: Secondary | ICD-10-CM | POA: Diagnosis present

## 2023-11-20 DIAGNOSIS — Z944 Liver transplant status: Secondary | ICD-10-CM

## 2023-11-20 DIAGNOSIS — I132 Hypertensive heart and chronic kidney disease with heart failure and with stage 5 chronic kidney disease, or end stage renal disease: Secondary | ICD-10-CM | POA: Diagnosis present

## 2023-11-20 DIAGNOSIS — Z79899 Other long term (current) drug therapy: Secondary | ICD-10-CM

## 2023-11-20 DIAGNOSIS — W1789XA Other fall from one level to another, initial encounter: Secondary | ICD-10-CM | POA: Diagnosis present

## 2023-11-20 DIAGNOSIS — R339 Retention of urine, unspecified: Secondary | ICD-10-CM | POA: Diagnosis present

## 2023-11-20 DIAGNOSIS — G40909 Epilepsy, unspecified, not intractable, without status epilepticus: Secondary | ICD-10-CM | POA: Diagnosis present

## 2023-11-20 DIAGNOSIS — I10 Essential (primary) hypertension: Secondary | ICD-10-CM | POA: Diagnosis present

## 2023-11-20 DIAGNOSIS — G9341 Metabolic encephalopathy: Secondary | ICD-10-CM | POA: Diagnosis present

## 2023-11-20 DIAGNOSIS — E785 Hyperlipidemia, unspecified: Secondary | ICD-10-CM | POA: Diagnosis present

## 2023-11-20 DIAGNOSIS — R1310 Dysphagia, unspecified: Secondary | ICD-10-CM | POA: Diagnosis present

## 2023-11-20 DIAGNOSIS — Z6825 Body mass index (BMI) 25.0-25.9, adult: Secondary | ICD-10-CM

## 2023-11-20 DIAGNOSIS — Z1152 Encounter for screening for COVID-19: Secondary | ICD-10-CM

## 2023-11-20 DIAGNOSIS — E872 Acidosis, unspecified: Principal | ICD-10-CM | POA: Diagnosis present

## 2023-11-20 DIAGNOSIS — E43 Unspecified severe protein-calorie malnutrition: Secondary | ICD-10-CM | POA: Diagnosis present

## 2023-11-20 DIAGNOSIS — B182 Chronic viral hepatitis C: Secondary | ICD-10-CM | POA: Diagnosis present

## 2023-11-20 DIAGNOSIS — J439 Emphysema, unspecified: Secondary | ICD-10-CM | POA: Diagnosis present

## 2023-11-20 DIAGNOSIS — D509 Iron deficiency anemia, unspecified: Secondary | ICD-10-CM | POA: Diagnosis present

## 2023-11-20 DIAGNOSIS — F0390 Unspecified dementia without behavioral disturbance: Secondary | ICD-10-CM | POA: Diagnosis present

## 2023-11-20 DIAGNOSIS — Z7901 Long term (current) use of anticoagulants: Secondary | ICD-10-CM

## 2023-11-20 DIAGNOSIS — D631 Anemia in chronic kidney disease: Secondary | ICD-10-CM | POA: Diagnosis present

## 2023-11-20 DIAGNOSIS — Z940 Kidney transplant status: Secondary | ICD-10-CM

## 2023-11-20 DIAGNOSIS — Z8249 Family history of ischemic heart disease and other diseases of the circulatory system: Secondary | ICD-10-CM

## 2023-11-20 DIAGNOSIS — R531 Weakness: Secondary | ICD-10-CM | POA: Diagnosis not present

## 2023-11-20 DIAGNOSIS — J90 Pleural effusion, not elsewhere classified: Secondary | ICD-10-CM

## 2023-11-20 DIAGNOSIS — I5033 Acute on chronic diastolic (congestive) heart failure: Secondary | ICD-10-CM | POA: Diagnosis present

## 2023-11-20 DIAGNOSIS — N401 Enlarged prostate with lower urinary tract symptoms: Secondary | ICD-10-CM | POA: Diagnosis present

## 2023-11-20 LAB — I-STAT VENOUS BLOOD GAS, ED
Acid-base deficit: 9 mmol/L — ABNORMAL HIGH (ref 0.0–2.0)
Bicarbonate: 15.1 mmol/L — ABNORMAL LOW (ref 20.0–28.0)
Calcium, Ion: 1.43 mmol/L — ABNORMAL HIGH (ref 1.15–1.40)
HCT: 33 % — ABNORMAL LOW (ref 39.0–52.0)
Hemoglobin: 11.2 g/dL — ABNORMAL LOW (ref 13.0–17.0)
O2 Saturation: 98 %
Potassium: 5 mmol/L (ref 3.5–5.1)
Sodium: 144 mmol/L (ref 135–145)
TCO2: 16 mmol/L — ABNORMAL LOW (ref 22–32)
pCO2, Ven: 26.8 mmHg — ABNORMAL LOW (ref 44–60)
pH, Ven: 7.358 (ref 7.25–7.43)
pO2, Ven: 98 mmHg — ABNORMAL HIGH (ref 32–45)

## 2023-11-20 LAB — CBC WITH DIFFERENTIAL/PLATELET
Abs Immature Granulocytes: 0.04 10*3/uL (ref 0.00–0.07)
Basophils Absolute: 0.1 10*3/uL (ref 0.0–0.1)
Basophils Relative: 1 %
Eosinophils Absolute: 0 10*3/uL (ref 0.0–0.5)
Eosinophils Relative: 0 %
HCT: 29.3 % — ABNORMAL LOW (ref 39.0–52.0)
Hemoglobin: 9.2 g/dL — ABNORMAL LOW (ref 13.0–17.0)
Immature Granulocytes: 0 %
Lymphocytes Relative: 4 %
Lymphs Abs: 0.5 10*3/uL — ABNORMAL LOW (ref 0.7–4.0)
MCH: 24.2 pg — ABNORMAL LOW (ref 26.0–34.0)
MCHC: 31.4 g/dL (ref 30.0–36.0)
MCV: 77.1 fL — ABNORMAL LOW (ref 80.0–100.0)
Monocytes Absolute: 0.5 10*3/uL (ref 0.1–1.0)
Monocytes Relative: 5 %
Neutro Abs: 10 10*3/uL — ABNORMAL HIGH (ref 1.7–7.7)
Neutrophils Relative %: 90 %
Platelets: 347 10*3/uL (ref 150–400)
RBC: 3.8 MIL/uL — ABNORMAL LOW (ref 4.22–5.81)
RDW: 16.6 % — ABNORMAL HIGH (ref 11.5–15.5)
WBC: 11.1 10*3/uL — ABNORMAL HIGH (ref 4.0–10.5)
nRBC: 0 % (ref 0.0–0.2)

## 2023-11-20 LAB — COMPREHENSIVE METABOLIC PANEL
ALT: 19 U/L (ref 0–44)
AST: 18 U/L (ref 15–41)
Albumin: 3.2 g/dL — ABNORMAL LOW (ref 3.5–5.0)
Alkaline Phosphatase: 73 U/L (ref 38–126)
Anion gap: 11 (ref 5–15)
BUN: 62 mg/dL — ABNORMAL HIGH (ref 8–23)
CO2: 15 mmol/L — ABNORMAL LOW (ref 22–32)
Calcium: 10.9 mg/dL — ABNORMAL HIGH (ref 8.9–10.3)
Chloride: 118 mmol/L — ABNORMAL HIGH (ref 98–111)
Creatinine, Ser: 2.35 mg/dL — ABNORMAL HIGH (ref 0.61–1.24)
GFR, Estimated: 28 mL/min — ABNORMAL LOW (ref >60)
Glucose, Bld: 191 mg/dL — ABNORMAL HIGH (ref 70–99)
Potassium: 4.6 mmol/L (ref 3.5–5.1)
Sodium: 144 mmol/L (ref 135–145)
Total Bilirubin: 0.9 mg/dL (ref 0.0–1.2)
Total Protein: 7.6 g/dL (ref 6.5–8.1)

## 2023-11-20 LAB — I-STAT CHEM 8, ED
BUN: 57 mg/dL — ABNORMAL HIGH (ref 8–23)
Calcium, Ion: 1.44 mmol/L — ABNORMAL HIGH (ref 1.15–1.40)
Chloride: 122 mmol/L — ABNORMAL HIGH (ref 98–111)
Creatinine, Ser: 2.5 mg/dL — ABNORMAL HIGH (ref 0.61–1.24)
Glucose, Bld: 187 mg/dL — ABNORMAL HIGH (ref 70–99)
HCT: 32 % — ABNORMAL LOW (ref 39.0–52.0)
Hemoglobin: 10.9 g/dL — ABNORMAL LOW (ref 13.0–17.0)
Potassium: 4.6 mmol/L (ref 3.5–5.1)
Sodium: 145 mmol/L (ref 135–145)
TCO2: 16 mmol/L — ABNORMAL LOW (ref 22–32)

## 2023-11-20 LAB — I-STAT CG4 LACTIC ACID, ED: Lactic Acid, Venous: 1.1 mmol/L (ref 0.5–1.9)

## 2023-11-20 LAB — SAMPLE TO BLOOD BANK

## 2023-11-20 LAB — RESP PANEL BY RT-PCR (RSV, FLU A&B, COVID)  RVPGX2
Influenza A by PCR: NEGATIVE
Influenza B by PCR: NEGATIVE
Resp Syncytial Virus by PCR: NEGATIVE
SARS Coronavirus 2 by RT PCR: NEGATIVE

## 2023-11-20 LAB — TROPONIN I (HIGH SENSITIVITY): Troponin I (High Sensitivity): 21 ng/L — ABNORMAL HIGH (ref <18)

## 2023-11-20 LAB — ETHANOL: Alcohol, Ethyl (B): <10 mg/dL (ref <10)

## 2023-11-20 LAB — BRAIN NATRIURETIC PEPTIDE: B Natriuretic Peptide: 213.9 pg/mL — ABNORMAL HIGH (ref 0.0–100.0)

## 2023-11-20 LAB — PROTIME-INR
INR: 1.2 (ref 0.8–1.2)
Prothrombin Time: 15.9 s — ABNORMAL HIGH (ref 11.4–15.2)

## 2023-11-20 MED ORDER — SODIUM BICARBONATE 650 MG PO TABS
650.0000 mg | ORAL_TABLET | Freq: Once | ORAL | Status: AC
  Administered 2023-11-21: 650 mg via ORAL
  Filled 2023-11-20: qty 1

## 2023-11-20 NOTE — ED Triage Notes (Signed)
 Patient coming from home. Home health nurse found patient on the floor with his [place] lift about 5 feet off the ground. Patient's BP taken by home health nurse was 70s systolic. Oxygen saturation was 80s on EMS arrival to scene and patient was placed on nonrebreather. Patient was able to answer yes and no questions with EMS. Hx of seizures and dementia.

## 2023-11-20 NOTE — ED Notes (Signed)
 CCMD notified for continuous cardiac monitoring.

## 2023-11-20 NOTE — ED Provider Notes (Signed)
 [place] EMERGENCY DEPARTMENT AT [facility] Provider Note   CSN: [id number] Arrival date & time: 11/20/23  1945     History  Chief Complaint  Patient presents with   Gilbert Wilson is a 78 y.o. male.  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  Wife reports that she was out running an errand when the home health aide called her to let her know he had fallen out of the [place] lift.  She is not sure how far off the ground he was at the time but that he slipped out of the lift and fell down and fell backwards.  He reported that it happened so fast and is not sure if the patient lost consciousness or hit his head.  He was able to help Gilbert Wilson get back into the bed.  This happened around noon today.  Mr. and his wife felt that he continued to be sleepy, not acting himself, and appeared to have more labored breathing.  She reports since he was in the hospital for influenza, he is continue to have some rapid breathing.  He seems sleepier, not acting himself, so she called EMS.  She denies him having any known fevers.  He has had continued cough.  No nausea, vomiting or abdominal pain.  No known urinary symptoms.   Home Medications Prior to Admission medications   Medication Sig Start Date End Date Taking? Authorizing Provider  acetaminophen (TYLENOL) 500 MG tablet Take 1,000 mg by mouth every 6 (six) hours as needed for moderate pain, headache or fever. Patient not taking: Reported on  09/28/2023    [provider]  albuterol (VENTOLIN HFA) 108 (90 Base) MCG/ACT inhaler Inhale 2 puffs into the lungs every 6 (six) hours as needed for wheezing or shortness of breath. Patient not taking: Reported on 09/28/2023    [provider]  amLODipine (NORVASC) 10 MG tablet Take 10 mg by mouth daily.    [provider]  ascorbic acid (VITAMIN C) 250 MG tablet Take 250 mg by mouth daily. Take 1 tablet by mouth with the 325mg  iron tablet.    [provider]  calcitRIOL (ROCALTROL) 0.25 MCG capsule Take 0.25 mcg by mouth daily.    [provider]  clopidogrel (PLAVIX) 75 MG tablet Take 75 mg by mouth daily.    [provider]  cycloSPORINE modified (NEORAL) 100 MG capsule Take 150 mg by mouth 2 (two) times daily.    [provider]  Ensure (ENSURE) Take 1 Can by mouth daily as needed. Patient not taking: Reported on 09/28/2023    [provider]  epoetin alfa (EPOGEN) 10000 UNIT/ML injection Inject 10,000 Units into the skin every 14 (fourteen) days.    [provider]  Ergocalciferol (VITAMIN D2) 50 MCG (2000 UT) TABS Take 1 tablet by mouth daily. Take 1 tablet by mouth with 0.4mg  of Tamsulosin.    [provider]  ferrous sulfate 325 (65 FE) MG EC tablet Take 325 mg by mouth daily. Take 1 tablet by mouth with the 250mg  vitamin c  tablet.    [provider]  finasteride (PROSCAR) 5 MG tablet Take 5 mg by mouth every evening.    [provider]  furosemide (LASIX) 20 MG tablet Take 20 mg by mouth every other day.    [provider]  hydrALAZINE (APRESOLINE) 50 MG tablet Take 1 tablet (50 mg total) by mouth 3 (three) times daily. Patient taking differently: Take 100 mg by mouth in the morning and at bedtime. 01/04/22   Rolly Salter, MD  insulin glargine (LANTUS SOLOSTAR) 100 UNIT/ML Solostar Pen Inject 0-6 Units into the skin at bedtime. If BS>130-Take 6 units, Increase 1 unit for every  10 mg/dl above 782.    [provider]  levETIRAcetam (KEPPRA) 750 MG tablet Take 1 tablet (750 mg total) by mouth 2 (two) times daily. 04/29/23   Gerhard Munch, MD  losartan (COZAAR) 50 MG tablet Take 50 mg by mouth in the morning and at bedtime.    [provider]  MAGNESIUM OXIDE 400 PO Take 400 mg by mouth daily.    [provider]  mirtazapine (REMERON) 15 MG tablet Take 0.5 tablets (7.5 mg total) by mouth at bedtime. Patient taking differently: Take 15 mg by mouth at bedtime. 01/04/22   Rolly Salter, MD  mycophenolate (MYFORTIC) 180 MG EC tablet Take 180 mg by mouth 2 (two) times daily.    [provider]  omeprazole (PRILOSEC) 20 MG capsule Take 20 mg by mouth daily as needed. Patient not taking: Reported on 09/28/2023    [provider]  rosuvastatin (CRESTOR) 5 MG tablet Take 5 mg by mouth every evening.    [provider]  sodium bicarbonate 650 MG tablet Take 1,300 mg by mouth 2 (two) times daily.    [provider]  tamsulosin (FLOMAX) 0.4 MG CAPS capsule Take 0.4 mg by mouth daily. Take 1 tablet by mouth with vitamin d 2000units.    [provider]      Allergies    Levaquin [levofloxacin], Linezolid, and Lisinopril    Review of Systems   Review of Systems  Physical Exam Updated Vital Signs BP (!) 148/77   Pulse 88   Temp 98 F (36.7 C)   Resp (!) 24   Ht 6\' 1"  (1.854 m)   Wt 95.3 kg   SpO2 97%   BMI 27.71 kg/m  Physical Exam Vitals and nursing note reviewed.  Constitutional:      General: He is not in acute distress.    Appearance: He is well-developed. He is not diaphoretic.  HENT:     Head: Normocephalic and atraumatic.  Eyes:     Conjunctiva/sclera: Conjunctivae normal.  Cardiovascular:     Rate and Rhythm: Normal rate and regular rhythm.     Heart sounds: Normal heart sounds. No murmur heard.    No friction rub. No gallop.  Pulmonary:     Effort: Tachypnea present. No respiratory  distress.     Breath sounds: Normal breath sounds. No wheezing or rales.  Abdominal:     General: There is no distension.     Palpations: Abdomen is soft.     Tenderness: There is no abdominal tenderness. There is no guarding.  Musculoskeletal:     Cervical back: Normal range of motion.  Skin:    General: Skin is warm and dry.  Neurological:     Mental Status: He is alert.     Motor: Weakness: right sided spastic hemiparesis.     ED  Results / Procedures / Treatments   Labs (all labs ordered are listed, but only abnormal results are displayed) Labs Reviewed  COMPREHENSIVE METABOLIC PANEL - Abnormal; Notable for the following components:      Result Value   Chloride 118 (*)    CO2 15 (*)    Glucose, Bld 191 (*)    BUN 62 (*)    Creatinine, Ser 2.35 (*)    Calcium 10.9 (*)    Albumin 3.2 (*)    GFR, Estimated 28 (*)    All other components within normal limits  PROTIME-INR - Abnormal; Notable for the following components:   Prothrombin Time 15.9 (*)    All other components within normal limits  BRAIN NATRIURETIC PEPTIDE - Abnormal; Notable for the following components:   B Natriuretic Peptide 213.9 (*)    All other components within normal limits  CBC WITH DIFFERENTIAL/PLATELET - Abnormal; Notable for the following components:   WBC 11.1 (*)    RBC 3.80 (*)    Hemoglobin 9.2 (*)    HCT 29.3 (*)    MCV 77.1 (*)    MCH 24.2 (*)    RDW 16.6 (*)    Neutro Abs 10.0 (*)    Lymphs Abs 0.5 (*)    All other components within normal limits  URINALYSIS, W/ REFLEX TO CULTURE (INFECTION SUSPECTED) - Abnormal; Notable for the following components:   Glucose, UA 100 (*)    Protein, ur 30 (*)    Leukocytes,Ua SMALL (*)    Bacteria, UA RARE (*)    All other components within normal limits  I-STAT CHEM 8, ED - Abnormal; Notable for the following components:   Chloride 122 (*)    BUN 57 (*)    Creatinine, Ser 2.50 (*)    Glucose, Bld 187 (*)    Calcium, Ion 1.44 (*)    TCO2 16  (*)    Hemoglobin 10.9 (*)    HCT 32.0 (*)    All other components within normal limits  I-STAT VENOUS BLOOD GAS, ED - Abnormal; Notable for the following components:   pCO2, Ven 26.8 (*)    pO2, Ven 98 (*)    Bicarbonate 15.1 (*)    TCO2 16 (*)    Acid-base deficit 9.0 (*)    Calcium, Ion 1.43 (*)    HCT 33.0 (*)    Hemoglobin 11.2 (*)    All other components within normal limits  TROPONIN I (HIGH SENSITIVITY) - Abnormal; Notable for the following components:   Troponin I (High Sensitivity) 21 (*)    All other components within normal limits  RESP PANEL BY RT-PCR (RSV, FLU A&B, COVID)  [id number]  URINE CULTURE  ETHANOL  I-STAT CG4 LACTIC ACID, ED  SAMPLE TO BLOOD BANK  TROPONIN I (HIGH SENSITIVITY)    EKG EKG Interpretation Date/Time:  Friday November 20 2023 20:00:24 EDT Ventricular Rate:  93 PR Interval:  244 QRS Duration:  121 QT Interval:  395 QTC Calculation: 492 R Axis:   -51  Text Interpretation: Sinus rhythm Prolonged PR interval Left bundle branch block No significant change since last tracing Confirmed by Alvira Monday ([id number]) on 11/20/2023 9:57:04 PM  Radiology CT CHEST ABDOMEN PELVIS WO CONTRAST Result Date: 11/20/2023 CLINICAL DATA:  Rib fracture suspected, traumatic EXAM: CT CHEST, ABDOMEN AND PELVIS WITHOUT CONTRAST TECHNIQUE: Multidetector CT imaging of the chest, abdomen and pelvis was performed following the standard protocol without IV contrast. RADIATION DOSE REDUCTION: This exam was performed according to the  departmental dose-optimization program which includes automated exposure control, adjustment of the mA and/or kV according to patient size and/or use of iterative reconstruction technique. COMPARISON:  None Available. FINDINGS: Evaluation is generally limited by lack of intravenous contrast and breath motion artifact, particularly in the lower abdomen, as well as streak artifact from patient arm positioning CT CHEST FINDINGS Cardiovascular: Aortic  atherosclerosis. Cardiomegaly. Left coronary artery calcifications. No pericardial effusion. Mediastinum/Nodes: No enlarged mediastinal, hilar, or axillary lymph nodes. Thyroid gland, trachea, and esophagus demonstrate no significant findings. Lungs/Pleura: Moderate, loculated right pleural effusion with associated atelectasis or consolidation. Musculoskeletal: No chest wall abnormality. No obvious fractures. Assessment for rib fracture is significantly limited by pervasive breath motion artifact throughout the chest. CT ABDOMEN PELVIS FINDINGS Hepatobiliary: No focal liver abnormality is seen. Status post cholecystectomy. No biliary dilatation. Pancreas: Unremarkable. No pancreatic ductal dilatation or surrounding inflammatory changes. Spleen: Normal in size without significant abnormality. Adrenals/Urinary Tract: Adrenal glands are unremarkable. 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 No hydronephrosis (series 5, image 70). Bladder is unremarkable. Stomach/Bowel: Stomach is within normal limits. Appendix appears normal. No evidence of bowel wall thickening, distention, or inflammatory changes. Large stool ball in the rectum. Vascular/Lymphatic: Aortic atherosclerosis. 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 No enlarged abdominal or pelvic lymph nodes. Reproductive: Prostatomegaly. Other: No abdominal wall hernia.  Anasarca.  No ascites. Musculoskeletal: No acute osseous findings. IMPRESSION: 1. Evaluation is generally limited by lack of intravenous contrast and breath motion artifact, particularly in the lower abdomen, as well as streak artifact from patient arm positioning. 2. No obvious fractures. Assessment for rib fracture is significantly limited by pervasive breath motion artifact throughout the chest. 3. Moderate, loculated right pleural effusion with associated atelectasis or  consolidation. 4. 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 No hydronephrosis. 5. Large stool ball in the rectum. 6. Prostatomegaly. 7. Coronary artery disease. Aortic Atherosclerosis (ICD10-I70.0). Electronically Signed   By: Jearld Lesch M.D.   On: 11/20/2023 20:56   CT Head Wo Contrast Result Date: 11/20/2023 CLINICAL DATA:  Recent fall with headaches and neck pain, initial encounter EXAM: CT HEAD WITHOUT CONTRAST CT CERVICAL SPINE WITHOUT CONTRAST TECHNIQUE: Multidetector CT imaging of the head and cervical spine was performed following the standard protocol without intravenous contrast. Multiplanar CT image reconstructions of the cervical spine were also generated. RADIATION DOSE REDUCTION: This exam was performed according to the departmental dose-optimization program which includes automated exposure control, adjustment of the mA and/or kV according to patient size and/or use of iterative reconstruction technique. COMPARISON:  04/29/2023 FINDINGS: CT HEAD FINDINGS Brain: No evidence of acute infarction, hemorrhage, hydrocephalus, extra-axial collection or mass lesion/mass effect. Mild atrophic and chronic white matter ischemic changes are noted. Vascular: No hyperdense vessel or unexpected calcification. Skull: Normal. Negative for fracture or focal lesion. Sinuses/Orbits: No acute finding. Other: None. CT CERVICAL SPINE FINDINGS Alignment: Within normal limits. Skull base and vertebrae: 7 cervical segments are well visualized. Vertebral body height is well maintained. Multilevel osteophytic and facet hypertrophic changes are noted. No acute fracture or acute facet abnormality is noted. The odontoid is within normal limits. Soft tissues and spinal canal: Surrounding soft tissue structures are within normal limits. Disc levels:  Mild disc bulging is noted at C3-4 and C5-6. Upper chest: Pleural effusion  is noted on the right Other: None IMPRESSION: CT of the head: No acute intracranial  abnormality noted. Chronic atrophic and ischemic changes. CT of the cervical spine: Multilevel degenerative change without acute abnormality. Electronically Signed   By: Alcide Clever M.D.   On: 11/20/2023 20:43   CT Cervical Spine Wo Contrast Result Date: 11/20/2023 CLINICAL DATA:  Recent fall with headaches and neck pain, initial encounter EXAM: CT HEAD WITHOUT CONTRAST CT CERVICAL SPINE WITHOUT CONTRAST TECHNIQUE: Multidetector CT imaging of the head and cervical spine was performed following the standard protocol without intravenous contrast. Multiplanar CT image reconstructions of the cervical spine were also generated. RADIATION DOSE REDUCTION: This exam was performed according to the departmental dose-optimization program which includes automated exposure control, adjustment of the mA and/or kV according to patient size and/or use of iterative reconstruction technique. COMPARISON:  04/29/2023 FINDINGS: CT HEAD FINDINGS Brain: No evidence of acute infarction, hemorrhage, hydrocephalus, extra-axial collection or mass lesion/mass effect. Mild atrophic and chronic white matter ischemic changes are noted. Vascular: No hyperdense vessel or unexpected calcification. Skull: Normal. Negative for fracture or focal lesion. Sinuses/Orbits: No acute finding. Other: None. CT CERVICAL SPINE FINDINGS Alignment: Within normal limits. Skull base and vertebrae: 7 cervical segments are well visualized. Vertebral body height is well maintained. Multilevel osteophytic and facet hypertrophic changes are noted. No acute fracture or acute facet abnormality is noted. The odontoid is within normal limits. Soft tissues and spinal canal: Surrounding soft tissue structures are within normal limits. Disc levels:  Mild disc bulging is noted at C3-4 and C5-6. Upper chest: Pleural effusion is noted on the right Other: None IMPRESSION: CT of the head: No  acute intracranial abnormality noted. Chronic atrophic and ischemic changes. CT of the cervical spine: Multilevel degenerative change without acute abnormality. Electronically Signed   By: Alcide Clever M.D.   On: 11/20/2023 20:43   DG Pelvis Portable Result Date: 11/20/2023 CLINICAL DATA:  Status post fall. EXAM: PORTABLE PELVIS 1-2 VIEWS COMPARISON:  None Available. FINDINGS: There is no evidence of pelvic fracture or diastasis. No pelvic bone lesions are seen. IMPRESSION: Negative. Electronically Signed   By: Aram Candela M.D.   On: 11/20/2023 20:26   DG Chest Port 1 View Result Date: 11/20/2023 CLINICAL DATA:  Status post fall. EXAM: PORTABLE CHEST 1 VIEW COMPARISON:  September 30, 2023 FINDINGS: The heart size and mediastinal contours are within normal limits. There is prominence of the central pulmonary vasculature. Low lung volumes are noted with mild atelectatic changes seen within the bilateral lung bases. There is no evidence of focal consolidation, pleural effusion or pneumothorax. The visualized skeletal structures are unremarkable. IMPRESSION: Low lung volumes with pulmonary vascular congestion and mild bibasilar atelectasis. Electronically Signed   By: Aram Candela M.D.   On: 11/20/2023 20:25    Procedures Procedures    Medications Ordered in ED Medications  vancomycin (VANCOREADY) IVPB 2000 mg/400 mL (2,000 mg Intravenous New Bag/Given 11/21/23 0044)  sodium bicarbonate tablet 650 mg (has no administration in time range)  sodium bicarbonate tablet 650 mg (650 mg Oral Given 11/21/23 0051)  ceFEPIme (MAXIPIME) 2 g in sodium chloride 0.9 % 100 mL IVPB (2 g Intravenous New Bag/Given 11/21/23 0043)    ED Course/ Medical Decision Making/ A&P                                  78 year old male with a history of hypertension, insulin-dependent type 2 diabetes, atrial fibrillation status post ablation, CVA with right-sided hemiplegia, ESRD status post renal and  liver transplant in  2016 now with CKD, chronic hepatitis C with cirrhosis, dementia, seizure disorder, BPH, recent hospitalization for influenza, UTI, with metabolic encephalopathy, acute respiratory failure with hypoxia and hypernatremia who presents with concern for fall from his [place] lift, rigid mental status, labored breathing.  He arrives as a Level II Trauma for fall on plavix.   Given his limited history with dementia, change in his mental status, and trauma, CT head, cervical spine, chest abdomen pelvis were ordered.  Due to his CKD and renal transplant history this was done without contrast.  CT limited for rib fractures due to breath motion artifact, there is signs of a moderate loculated right pleural effusion with associated atelectasis or consolidation.  Given his history of recent admission, continued cough per family, will treat for pneumonia with vancomycin and cefepime.  CT also shows right lower transplant with adjacent perinephric and periureteral fat stranding of uncertain significance or chronicity without hydronephrosis--urinalysis is pending at this time.  He does not appear to have specific right lower quadrant tenderness on exam. UA does show possible UTI 11-20WBC.  He is on antirejection medication, but at this point sees a nephrologist at the [facility] for continued posttransplant care after having his transplant done at [facility] in 2016.  Labs completed and personally eval did by me show hemoglobin of 9.2, similar to January when it was 9.6.  BNP is mildly elevated at 213, and troponin mildly elevated at 21.  COVID, flu, RSV testing are negative.  Lactic acid is within normal limits.  Mild leukocytosis noted.  Labs are significant for normal pH with a pCO2 of 26, and bicarb of 15.  He has a compensated metabolic acidosis without an anion gap, suspect this is in setting of his renal disease.  His family reports that he had been on oral bicarb however his [place] nephrologist had stopped it 1  month ago.  I do suspect his tachypnea is partially secondary to his metabolic acidosis.  Reordered his home dose of bicarb which is 2 tablets 650 mg.   Will admit to the hospital with concern for pneumonia with loculated pleural effusion, encephalopathy, and non anion gap metabolic acidosis.        Final Clinical Impression(s) / ED Diagnoses Final diagnoses:  Metabolic acidosis  Loculated pleural effusion  Pneumonia of right lower lobe due to infectious organism    Rx / DC Orders ED Discharge Orders     None         Alvira Monday, MD 11/21/23 [phone]

## 2023-11-20 NOTE — ED Notes (Signed)
 Trauma Response Nurse Documentation   Gilbert Wilson is a 78 y.o. male arriving to [facility] ED via [facility]  On clopidogrel 75 mg daily. Trauma was activated as a Level 2 by [place], Consulting civil engineer based on the following trauma criteria Elderly patients > 65 with head trauma on anti-coagulation (excluding ASA).  Patient cleared for CT by Dr. Dalene Seltzer. Pt transported to CT with trauma response nurse present to monitor. RN remained with the patient throughout their absence from the department for clinical observation.   GCS 15.  Trauma MD Arrival Time: .  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 Patient was dropped from hoyer lift by home aid. On arrival patient transferred from EMS stretcher to hospital stretcher. Manual BP obtained. Trauma labs obtained. 20 G PIV LAC established. Xray chest and pelvis completed. Patient log-rolled by team. Patient to CT with TRN. CT head, c-spine, chest/abdomen/pelvis completed. Patient back  to room at this time.  MTP Summary (If applicable):  N/A  Bedside handoff with ED RN Hansel Starling.    Leota Sauers  Trauma Response RN  Please call TRN at [phone] for further assistance.

## 2023-11-20 NOTE — ED Notes (Signed)
ED Provider at bedside. C-collar removed 

## 2023-11-20 NOTE — Progress Notes (Signed)
 Chaplain responds to Level 2 fall and provides pastoral support to pt and wife Myrene Buddy, who shares the story of their move to [facility]. and her deep Christian faith. Chaplain encourages sharing and offers prayer for strength and guidance. Yvonne's son is getting married in [place] this weekend and she had to return home to be with pt.

## 2023-11-20 NOTE — Progress Notes (Signed)
 Orthopedic Tech Progress Note Patient Details:  Gilbert Wilson 05/28/1946 [id number]  Patient ID: Gilbert Wilson, male   DOB: 1946-01-07, 78 y.o.   MRN: [id number] Level 2 trauma Tonye Pearson 11/20/2023, 8:24 PM

## 2023-11-21 ENCOUNTER — Inpatient Hospital Stay (HOSPITAL_COMMUNITY)

## 2023-11-21 ENCOUNTER — Encounter (HOSPITAL_COMMUNITY): Payer: Self-pay | Admitting: Family Medicine

## 2023-11-21 DIAGNOSIS — I69351 Hemiplegia and hemiparesis following cerebral infarction affecting right dominant side: Secondary | ICD-10-CM | POA: Diagnosis not present

## 2023-11-21 DIAGNOSIS — E8770 Fluid overload, unspecified: Secondary | ICD-10-CM | POA: Diagnosis not present

## 2023-11-21 DIAGNOSIS — F03C0 Unspecified dementia, severe, without behavioral disturbance, psychotic disturbance, mood disturbance, and anxiety: Secondary | ICD-10-CM | POA: Diagnosis present

## 2023-11-21 DIAGNOSIS — E870 Hyperosmolality and hypernatremia: Secondary | ICD-10-CM | POA: Diagnosis present

## 2023-11-21 DIAGNOSIS — J810 Acute pulmonary edema: Secondary | ICD-10-CM | POA: Diagnosis not present

## 2023-11-21 DIAGNOSIS — E872 Acidosis, unspecified: Secondary | ICD-10-CM

## 2023-11-21 DIAGNOSIS — D84821 Immunodeficiency due to drugs: Secondary | ICD-10-CM | POA: Diagnosis present

## 2023-11-21 DIAGNOSIS — D631 Anemia in chronic kidney disease: Secondary | ICD-10-CM | POA: Diagnosis present

## 2023-11-21 DIAGNOSIS — E1122 Type 2 diabetes mellitus with diabetic chronic kidney disease: Secondary | ICD-10-CM | POA: Diagnosis present

## 2023-11-21 DIAGNOSIS — I10 Essential (primary) hypertension: Secondary | ICD-10-CM

## 2023-11-21 DIAGNOSIS — M7989 Other specified soft tissue disorders: Secondary | ICD-10-CM | POA: Diagnosis not present

## 2023-11-21 DIAGNOSIS — I5031 Acute diastolic (congestive) heart failure: Secondary | ICD-10-CM | POA: Diagnosis not present

## 2023-11-21 DIAGNOSIS — Z940 Kidney transplant status: Secondary | ICD-10-CM | POA: Diagnosis not present

## 2023-11-21 DIAGNOSIS — G40909 Epilepsy, unspecified, not intractable, without status epilepticus: Secondary | ICD-10-CM | POA: Diagnosis present

## 2023-11-21 DIAGNOSIS — J189 Pneumonia, unspecified organism: Secondary | ICD-10-CM

## 2023-11-21 DIAGNOSIS — G9341 Metabolic encephalopathy: Secondary | ICD-10-CM | POA: Diagnosis present

## 2023-11-21 DIAGNOSIS — I5033 Acute on chronic diastolic (congestive) heart failure: Secondary | ICD-10-CM | POA: Diagnosis present

## 2023-11-21 DIAGNOSIS — J90 Pleural effusion, not elsewhere classified: Secondary | ICD-10-CM

## 2023-11-21 DIAGNOSIS — I080 Rheumatic disorders of both mitral and aortic valves: Secondary | ICD-10-CM | POA: Diagnosis present

## 2023-11-21 DIAGNOSIS — Z1152 Encounter for screening for COVID-19: Secondary | ICD-10-CM | POA: Diagnosis not present

## 2023-11-21 DIAGNOSIS — J9621 Acute and chronic respiratory failure with hypoxia: Secondary | ICD-10-CM | POA: Diagnosis present

## 2023-11-21 DIAGNOSIS — I825Y2 Chronic embolism and thrombosis of unspecified deep veins of left proximal lower extremity: Secondary | ICD-10-CM | POA: Diagnosis present

## 2023-11-21 DIAGNOSIS — L89151 Pressure ulcer of sacral region, stage 1: Secondary | ICD-10-CM | POA: Diagnosis not present

## 2023-11-21 DIAGNOSIS — X58XXXA Exposure to other specified factors, initial encounter: Secondary | ICD-10-CM | POA: Diagnosis not present

## 2023-11-21 DIAGNOSIS — Y92009 Unspecified place in unspecified non-institutional (private) residence as the place of occurrence of the external cause: Secondary | ICD-10-CM | POA: Diagnosis not present

## 2023-11-21 DIAGNOSIS — J439 Emphysema, unspecified: Secondary | ICD-10-CM | POA: Diagnosis present

## 2023-11-21 DIAGNOSIS — E43 Unspecified severe protein-calorie malnutrition: Secondary | ICD-10-CM | POA: Diagnosis present

## 2023-11-21 DIAGNOSIS — Y830 Surgical operation with transplant of whole organ as the cause of abnormal reaction of the patient, or of later complication, without mention of misadventure at the time of the procedure: Secondary | ICD-10-CM | POA: Diagnosis present

## 2023-11-21 DIAGNOSIS — I132 Hypertensive heart and chronic kidney disease with heart failure and with stage 5 chronic kidney disease, or end stage renal disease: Secondary | ICD-10-CM | POA: Diagnosis present

## 2023-11-21 DIAGNOSIS — N401 Enlarged prostate with lower urinary tract symptoms: Secondary | ICD-10-CM | POA: Diagnosis not present

## 2023-11-21 DIAGNOSIS — N3000 Acute cystitis without hematuria: Secondary | ICD-10-CM | POA: Diagnosis not present

## 2023-11-21 DIAGNOSIS — R531 Weakness: Secondary | ICD-10-CM | POA: Diagnosis present

## 2023-11-21 DIAGNOSIS — I82512 Chronic embolism and thrombosis of left femoral vein: Secondary | ICD-10-CM | POA: Diagnosis present

## 2023-11-21 DIAGNOSIS — G464 Cerebellar stroke syndrome: Secondary | ICD-10-CM

## 2023-11-21 DIAGNOSIS — N186 End stage renal disease: Secondary | ICD-10-CM | POA: Diagnosis present

## 2023-11-21 DIAGNOSIS — F0390 Unspecified dementia without behavioral disturbance: Secondary | ICD-10-CM

## 2023-11-21 DIAGNOSIS — W1789XA Other fall from one level to another, initial encounter: Secondary | ICD-10-CM | POA: Diagnosis present

## 2023-11-21 DIAGNOSIS — Z944 Liver transplant status: Secondary | ICD-10-CM | POA: Diagnosis not present

## 2023-11-21 DIAGNOSIS — I700 Atherosclerosis of aorta: Secondary | ICD-10-CM | POA: Diagnosis present

## 2023-11-21 DIAGNOSIS — T8619 Other complication of kidney transplant: Secondary | ICD-10-CM | POA: Diagnosis present

## 2023-11-21 LAB — BODY FLUID CELL COUNT WITH DIFFERENTIAL
Eos, Fluid: 9 %
Lymphs, Fluid: 54 %
Monocyte-Macrophage-Serous Fluid: 22 % — ABNORMAL LOW (ref 50–90)
Neutrophil Count, Fluid: 11 % (ref 0–25)
Other Cells, Fluid: 4 %
Total Nucleated Cell Count, Fluid: 1831 uL — ABNORMAL HIGH (ref 0–1000)

## 2023-11-21 LAB — TROPONIN I (HIGH SENSITIVITY)
Troponin I (High Sensitivity): 62 ng/L — ABNORMAL HIGH (ref <18)
Troponin I (High Sensitivity): 67 ng/L — ABNORMAL HIGH (ref <18)

## 2023-11-21 LAB — BASIC METABOLIC PANEL
Anion gap: 12 (ref 5–15)
BUN: 61 mg/dL — ABNORMAL HIGH (ref 8–23)
CO2: 14 mmol/L — ABNORMAL LOW (ref 22–32)
Calcium: 10.6 mg/dL — ABNORMAL HIGH (ref 8.9–10.3)
Chloride: 119 mmol/L — ABNORMAL HIGH (ref 98–111)
Creatinine, Ser: 2.32 mg/dL — ABNORMAL HIGH (ref 0.61–1.24)
GFR, Estimated: 28 mL/min — ABNORMAL LOW (ref >60)
Glucose, Bld: 140 mg/dL — ABNORMAL HIGH (ref 70–99)
Potassium: 4.5 mmol/L (ref 3.5–5.1)
Sodium: 145 mmol/L (ref 135–145)

## 2023-11-21 LAB — CBC WITH DIFFERENTIAL/PLATELET
Abs Immature Granulocytes: 0.05 10*3/uL (ref 0.00–0.07)
Basophils Absolute: 0.1 10*3/uL (ref 0.0–0.1)
Basophils Relative: 1 %
Eosinophils Absolute: 0.4 10*3/uL (ref 0.0–0.5)
Eosinophils Relative: 4 %
HCT: 29.9 % — ABNORMAL LOW (ref 39.0–52.0)
Hemoglobin: 9.3 g/dL — ABNORMAL LOW (ref 13.0–17.0)
Immature Granulocytes: 1 %
Lymphocytes Relative: 5 %
Lymphs Abs: 0.5 10*3/uL — ABNORMAL LOW (ref 0.7–4.0)
MCH: 24.2 pg — ABNORMAL LOW (ref 26.0–34.0)
MCHC: 31.1 g/dL (ref 30.0–36.0)
MCV: 77.9 fL — ABNORMAL LOW (ref 80.0–100.0)
Monocytes Absolute: 0.4 10*3/uL (ref 0.1–1.0)
Monocytes Relative: 4 %
Neutro Abs: 7.8 10*3/uL — ABNORMAL HIGH (ref 1.7–7.7)
Neutrophils Relative %: 85 %
Platelets: 353 10*3/uL (ref 150–400)
RBC: 3.84 MIL/uL — ABNORMAL LOW (ref 4.22–5.81)
RDW: 16.9 % — ABNORMAL HIGH (ref 11.5–15.5)
WBC: 9.2 10*3/uL (ref 4.0–10.5)
nRBC: 0 % (ref 0.0–0.2)

## 2023-11-21 LAB — ECHOCARDIOGRAM COMPLETE
AR max vel: 2.53 cm2
AV Area VTI: 2.34 cm2
AV Area mean vel: 2.34 cm2
AV Mean grad: 12 mmHg
AV Peak grad: 18 mmHg
Ao pk vel: 2.12 m/s
Height: 73 in
S' Lateral: 2.7 cm
Single Plane A4C EF: 63.8 %
Weight: 3360 [oz_av]

## 2023-11-21 LAB — URINALYSIS, W/ REFLEX TO CULTURE (INFECTION SUSPECTED)
Bilirubin Urine: NEGATIVE
Glucose, UA: 100 mg/dL — AB
Hgb urine dipstick: NEGATIVE
Ketones, ur: NEGATIVE mg/dL
Nitrite: NEGATIVE
Protein, ur: 30 mg/dL — AB
RBC / HPF: NONE SEEN RBC/hpf (ref 0–5)
Specific Gravity, Urine: 1.02 (ref 1.005–1.030)
pH: 5.5 (ref 5.0–8.0)

## 2023-11-21 LAB — GLUCOSE, CAPILLARY
Glucose-Capillary: 139 mg/dL — ABNORMAL HIGH (ref 70–99)
Glucose-Capillary: 121 mg/dL — ABNORMAL HIGH (ref 70–99)
Glucose-Capillary: 207 mg/dL — ABNORMAL HIGH (ref 70–99)

## 2023-11-21 LAB — BLOOD GAS, ARTERIAL
Acid-base deficit: 10.5 mmol/L — ABNORMAL HIGH (ref 0.0–2.0)
Bicarbonate: 13.7 mmol/L — ABNORMAL LOW (ref 20.0–28.0)
Drawn by: 5273
O2 Saturation: 98.9 %
Patient temperature: 37.2
pCO2 arterial: 26 mmHg — ABNORMAL LOW (ref 32–48)
pH, Arterial: 7.33 — ABNORMAL LOW (ref 7.35–7.45)
pO2, Arterial: 94 mmHg (ref 83–108)

## 2023-11-21 LAB — MRSA NEXT GEN BY PCR, NASAL: MRSA by PCR Next Gen: NOT DETECTED

## 2023-11-21 LAB — LACTATE DEHYDROGENASE: LDH: 150 U/L (ref 98–192)

## 2023-11-21 LAB — ALBUMIN, PLEURAL OR PERITONEAL FLUID: Albumin, Fluid: 2 g/dL

## 2023-11-21 LAB — PROCALCITONIN: Procalcitonin: 0.16 ng/mL

## 2023-11-21 LAB — LACTATE DEHYDROGENASE, PLEURAL OR PERITONEAL FLUID: LD, Fluid: 145 U/L — ABNORMAL HIGH (ref 3–23)

## 2023-11-21 LAB — PROTEIN, PLEURAL OR PERITONEAL FLUID: Total protein, fluid: 4.1 g/dL

## 2023-11-21 LAB — CBG MONITORING, ED: Glucose-Capillary: 143 mg/dL — ABNORMAL HIGH (ref 70–99)

## 2023-11-21 MED ORDER — CYCLOSPORINE MODIFIED (NEORAL) 100 MG PO CAPS: 100.0000 mg | ORAL_CAPSULE | Freq: Two times a day (BID) | ORAL | Status: DC

## 2023-11-21 MED ORDER — INSULIN ASPART 100 UNIT/ML IJ SOLN
0.0000 [IU] | Freq: Three times a day (TID) | INTRAMUSCULAR | Status: DC
  Administered 2023-11-21 (×2): 2 [IU] via SUBCUTANEOUS
  Administered 2023-11-22: 5 [IU] via SUBCUTANEOUS
  Administered 2023-11-22: 3 [IU] via SUBCUTANEOUS
  Administered 2023-11-23: 2 [IU] via SUBCUTANEOUS
  Administered 2023-11-23: 3 [IU] via SUBCUTANEOUS
  Administered 2023-11-23: 2 [IU] via SUBCUTANEOUS
  Administered 2023-11-24: 3 [IU] via SUBCUTANEOUS
  Administered 2023-11-24 – 2023-11-25 (×2): 2 [IU] via SUBCUTANEOUS
  Administered 2023-11-25 (×2): 3 [IU] via SUBCUTANEOUS
  Administered 2023-11-26 (×2): 2 [IU] via SUBCUTANEOUS
  Administered 2023-11-27: 3 [IU] via SUBCUTANEOUS
  Administered 2023-11-27 (×2): 2 [IU] via SUBCUTANEOUS
  Administered 2023-11-28 (×3): 3 [IU] via SUBCUTANEOUS
  Administered 2023-11-29: 5 [IU] via SUBCUTANEOUS
  Administered 2023-11-29 – 2023-11-30 (×4): 3 [IU] via SUBCUTANEOUS
  Administered 2023-11-30: 5 [IU] via SUBCUTANEOUS
  Administered 2023-12-01: 3 [IU] via SUBCUTANEOUS

## 2023-11-21 MED ORDER — FUROSEMIDE 20 MG PO TABS
20.0000 mg | ORAL_TABLET | ORAL | Status: DC
  Administered 2023-11-21: 20 mg via ORAL
  Filled 2023-11-21: qty 1

## 2023-11-21 MED ORDER — VANCOMYCIN HCL 2000 MG/400ML IV SOLN
2000.0000 mg | Freq: Once | INTRAVENOUS | Status: AC
  Administered 2023-11-21: 2000 mg via INTRAVENOUS
  Filled 2023-11-21: qty 400

## 2023-11-21 MED ORDER — LEVALBUTEROL HCL 0.63 MG/3ML IN NEBU
0.6300 mg | INHALATION_SOLUTION | Freq: Four times a day (QID) | RESPIRATORY_TRACT | Status: AC
  Administered 2023-11-21 – 2023-11-22 (×4): 0.63 mg via RESPIRATORY_TRACT
  Filled 2023-11-21 (×4): qty 3

## 2023-11-21 MED ORDER — SODIUM CHLORIDE 0.9 % IV SOLN
2.0000 g | Freq: Once | INTRAVENOUS | Status: AC
  Administered 2023-11-21: 2 g via INTRAVENOUS
  Filled 2023-11-21: qty 12.5

## 2023-11-21 MED ORDER — FUROSEMIDE 10 MG/ML IJ SOLN
40.0000 mg | Freq: Once | INTRAMUSCULAR | Status: AC
  Administered 2023-11-21: 40 mg via INTRAVENOUS
  Filled 2023-11-21: qty 4

## 2023-11-21 MED ORDER — ACETAMINOPHEN 325 MG PO TABS
650.0000 mg | ORAL_TABLET | Freq: Four times a day (QID) | ORAL | Status: DC | PRN
  Administered 2023-11-26 – 2023-12-03 (×3): 650 mg via ORAL
  Filled 2023-11-21 (×3): qty 2

## 2023-11-21 MED ORDER — LEVETIRACETAM 750 MG PO TABS
750.0000 mg | ORAL_TABLET | Freq: Two times a day (BID) | ORAL | Status: DC
  Administered 2023-11-21: 750 mg via ORAL
  Filled 2023-11-21 (×2): qty 1

## 2023-11-21 MED ORDER — FINASTERIDE 5 MG PO TABS
5.0000 mg | ORAL_TABLET | Freq: Every evening | ORAL | Status: DC
  Administered 2023-11-21 – 2023-12-02 (×9): 5 mg via ORAL
  Filled 2023-11-21 (×9): qty 1

## 2023-11-21 MED ORDER — LEVETIRACETAM 500 MG PO TABS
500.0000 mg | ORAL_TABLET | Freq: Two times a day (BID) | ORAL | Status: DC
  Administered 2023-11-21 – 2023-11-22 (×3): 500 mg via ORAL
  Filled 2023-11-21 (×5): qty 1

## 2023-11-21 MED ORDER — TAMSULOSIN HCL 0.4 MG PO CAPS
0.4000 mg | ORAL_CAPSULE | Freq: Every day | ORAL | Status: DC
  Administered 2023-11-21 – 2023-11-22 (×2): 0.4 mg via ORAL
  Filled 2023-11-21 (×6): qty 1

## 2023-11-21 MED ORDER — HYDRALAZINE HCL 50 MG PO TABS
100.0000 mg | ORAL_TABLET | Freq: Two times a day (BID) | ORAL | Status: DC
  Administered 2023-11-21 (×2): 100 mg via ORAL
  Filled 2023-11-21 (×2): qty 2

## 2023-11-21 MED ORDER — VANCOMYCIN HCL 2000 MG/400ML IV SOLN
2000.0000 mg | INTRAVENOUS | Status: DC
  Filled 2023-11-21: qty 400

## 2023-11-21 MED ORDER — IPRATROPIUM BROMIDE 0.02 % IN SOLN
0.5000 mg | Freq: Four times a day (QID) | RESPIRATORY_TRACT | Status: AC
  Administered 2023-11-21 – 2023-11-22 (×4): 0.5 mg via RESPIRATORY_TRACT
  Filled 2023-11-21 (×4): qty 2.5

## 2023-11-21 MED ORDER — SODIUM BICARBONATE 650 MG PO TABS
1300.0000 mg | ORAL_TABLET | Freq: Two times a day (BID) | ORAL | Status: DC
  Administered 2023-11-21 – 2023-11-22 (×3): 1300 mg via ORAL
  Filled 2023-11-21 (×3): qty 2

## 2023-11-21 MED ORDER — FUROSEMIDE 10 MG/ML IJ SOLN
100.0000 mg | Freq: Three times a day (TID) | INTRAVENOUS | Status: DC
  Administered 2023-11-21 – 2023-11-23 (×5): 100 mg via INTRAVENOUS
  Filled 2023-11-21 (×8): qty 10

## 2023-11-21 MED ORDER — CYCLOSPORINE MODIFIED (NEORAL) 25 MG PO CAPS
125.0000 mg | ORAL_CAPSULE | Freq: Two times a day (BID) | ORAL | Status: DC
  Administered 2023-11-21 – 2023-11-24 (×5): 125 mg via ORAL
  Filled 2023-11-21 (×9): qty 5

## 2023-11-21 MED ORDER — LOSARTAN POTASSIUM 50 MG PO TABS
50.0000 mg | ORAL_TABLET | Freq: Every day | ORAL | Status: DC
  Administered 2023-11-21: 50 mg via ORAL
  Filled 2023-11-21: qty 1

## 2023-11-21 MED ORDER — FUROSEMIDE 10 MG/ML IJ SOLN: 40.0000 mg | Freq: Two times a day (BID) | INTRAMUSCULAR | Status: DC

## 2023-11-21 MED ORDER — LIDOCAINE HCL (PF) 1 % IJ SOLN
5.0000 mL | Freq: Once | INTRAMUSCULAR | Status: AC
Start: 2023-11-21 — End: 2023-11-21
  Administered 2023-11-21: 5 mL

## 2023-11-21 MED ORDER — BUDESONIDE 0.25 MG/2ML IN SUSP
0.2500 mg | Freq: Two times a day (BID) | RESPIRATORY_TRACT | Status: DC
Start: 2023-11-21 — End: 2023-12-03
  Administered 2023-11-21 – 2023-12-02 (×21): 0.25 mg via RESPIRATORY_TRACT
  Filled 2023-11-21 (×21): qty 2

## 2023-11-21 MED ORDER — MELATONIN 5 MG PO TABS: 5.0000 mg | ORAL_TABLET | Freq: Every evening | ORAL | Status: DC | PRN

## 2023-11-21 MED ORDER — SODIUM BICARBONATE 650 MG PO TABS: 650.0000 mg | ORAL_TABLET | Freq: Two times a day (BID) | ORAL | Status: DC

## 2023-11-21 MED ORDER — CALCITRIOL 0.25 MCG PO CAPS
0.2500 ug | ORAL_CAPSULE | Freq: Every day | ORAL | Status: DC
  Administered 2023-11-21 – 2023-11-22 (×2): 0.25 ug via ORAL
  Filled 2023-11-21 (×3): qty 1

## 2023-11-21 MED ORDER — AMLODIPINE BESYLATE 5 MG PO TABS
5.0000 mg | ORAL_TABLET | Freq: Two times a day (BID) | ORAL | Status: DC
  Administered 2023-11-21 (×2): 5 mg via ORAL
  Filled 2023-11-21 (×3): qty 1

## 2023-11-21 MED ORDER — CLOPIDOGREL BISULFATE 75 MG PO TABS
75.0000 mg | ORAL_TABLET | Freq: Every day | ORAL | Status: DC
  Administered 2023-11-22: 75 mg via ORAL
  Filled 2023-11-21 (×2): qty 1

## 2023-11-21 MED ORDER — HEPARIN SODIUM (PORCINE) 5000 UNIT/ML IJ SOLN
5000.0000 [IU] | Freq: Three times a day (TID) | INTRAMUSCULAR | Status: DC
  Administered 2023-11-21 – 2023-11-29 (×23): 5000 [IU] via SUBCUTANEOUS
  Filled 2023-11-21 (×24): qty 1

## 2023-11-21 MED ORDER — ROSUVASTATIN CALCIUM 5 MG PO TABS
5.0000 mg | ORAL_TABLET | Freq: Every evening | ORAL | Status: DC
  Administered 2023-11-21 – 2023-11-22 (×2): 5 mg via ORAL
  Filled 2023-11-21 (×3): qty 1

## 2023-11-21 MED ORDER — SODIUM BICARBONATE 650 MG PO TABS: 650.0000 mg | ORAL_TABLET | Freq: Once | ORAL | Status: DC

## 2023-11-21 MED ORDER — CLOPIDOGREL BISULFATE 75 MG PO TABS: 75.0000 mg | ORAL_TABLET | Freq: Every day | ORAL | Status: DC

## 2023-11-21 MED ORDER — SODIUM CHLORIDE 0.9 % IV SOLN
2.0000 g | Freq: Two times a day (BID) | INTRAVENOUS | Status: DC
  Administered 2023-11-21 – 2023-11-22 (×4): 2 g via INTRAVENOUS
  Filled 2023-11-21 (×4): qty 12.5

## 2023-11-21 MED ORDER — ACETAMINOPHEN 650 MG RE SUPP
650.0000 mg | Freq: Four times a day (QID) | RECTAL | Status: DC | PRN
  Filled 2023-11-21: qty 1

## 2023-11-21 MED ORDER — SENNOSIDES-DOCUSATE SODIUM 8.6-50 MG PO TABS: 1.0000 | ORAL_TABLET | Freq: Every evening | ORAL | Status: DC | PRN

## 2023-11-21 MED ORDER — CYCLOSPORINE MODIFIED (NEORAL) 25 MG PO CAPS: 100.0000 mg | ORAL_CAPSULE | Freq: Two times a day (BID) | ORAL | Status: DC

## 2023-11-21 NOTE — ED Notes (Signed)
Patient transported to ECHO.

## 2023-11-21 NOTE — Progress Notes (Signed)
 Pharmacy Antibiotic Note  Gilbert Wilson is a 78 y.o. male admitted on 11/20/2023 with pneumonia.  Pharmacy has been consulted for Vancomycin/Cefepime dosing. WBC mildly elevated. Noted renal dysfunction.   Plan: Vancomycin 2000 mg IV q48h >>>Estimated AUC: 496 Cefepime 2g IV q12h Trend WBC, temp, renal function  F/U infectious work-up Drug levels as indicated   Height: 6\' 1"  (185.4 cm) Weight: 95.3 kg (210 lb) IBW/kg (Calculated) : 79.9  Temp (24hrs), Avg:98 F (36.7 C), Min:98 F (36.7 C), Max:98 F (36.7 C)  Recent Labs  Lab 11/20/23 1959 11/20/23 2000 11/20/23 2004 11/20/23 2057  WBC  --   --   --  11.1*  CREATININE 2.50* 2.35*  --   --   LATICACIDVEN  --   --  1.1  --     Estimated Creatinine Clearance: 30.2 mL/min (A) (by C-G formula based on SCr of 2.35 mg/dL (H)).    Allergies  Allergen Reactions   Levaquin [Levofloxacin] Itching   Linezolid     Seizures   Lisinopril Cough   Abran Duke, PharmD, BCPS Clinical Pharmacist Phone: [phone]

## 2023-11-21 NOTE — ED Notes (Signed)
 Patient transported to Ultrasound

## 2023-11-21 NOTE — ED Provider Notes (Signed)
 Care assumed from Dr. Dalene Seltzer. Patient had fall at home landing on left side.  Traumatic workup was negative.  Patient found to have pleural effusion and possible right-sided pneumonia.  Tachypneic but not hypoxic.  Some confusion per family  Remote history of liver and kidney transplant in 2016.  History of previous CVA with right-sided hemiplegia. Compensated metabolic acidosis without anion gap likely secondary to kidney disease.  Admission to the hospital for antibiotics in the setting of pneumonia with pleural effusion discussed with Dr. Joneen Roach.   Glynn Octave, MD 11/21/23 [phone]

## 2023-11-21 NOTE — Consult Note (Addendum)
 Renal Service Consult Note [facility]  Gilbert Wilson 11/21/2023 Gilbert Krabbe, MD Requesting Physician: Dr. Dartha Lodge  Reason for Consult: Renal failure  HPI: The patient is a 78 y.o. year-old w/ PMH as below who presented to ED w/ AMS, lethargy. Pt slid out of the hoyer lift at home. When they got him back in bed his BP was 70/50, and he had a heavy breathing pattern. His breathing pattern usually isn't normal, but his baseline is not this heavy (heavy for about last 2 wks). No recent chills, nausea, vomiting. Pt has liver-kidney transplant in 2016. At baseline pt is full care, does not feed himself or speak much. In ED SpO2 95% on RA. ABG pH 7.33, pCO2 26, PO2 94 on 4-5 L  O2. CXR showed vasc congestion, no overt edema. Pt's legs were quite swollen as well. Pt was admitted. IV lasix 40mg  was given. Creat is 2.32 here. Baseline  creat is 1.8-2.0 per the daughter. We are asked to see for renal failure.    Pt seen in ED. Family at bedside. Daughter states he doesn't speak much, cognitive disorder. Pt has not c/o's. He looks to be breathing heavily. BP's here 125- 155/ 57- 80. Pt had hx liver/ kidney transplant in [place], [place] in 2016. His renal MD is Dr. Conception Chancy Roy-Chaudhury at the [place] in [place].    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 His smoking use included cigarettes. He has never used smokeless tobacco. He reports that he does not drink alcohol and does not use drugs. Allergies  Allergies  Allergen Reactions   Levaquin [Levofloxacin] Itching   Linezolid     Seizures   Lisinopril Cough   Home medications Prior to Admission medications   Medication Sig Start Date End Date Taking? Authorizing Provider  acetaminophen (TYLENOL) 500 MG tablet Take 1,000 mg by mouth every 6 (six) hours as needed for moderate pain (pain score 4-6), headache or fever.   Yes [provider]  albuterol (VENTOLIN HFA) 108 (90 Base) MCG/ACT inhaler Inhale 2 puffs into the lungs every 6 (six) hours as needed for wheezing or shortness of breath.   Yes [provider]  amLODipine (NORVASC) 5 MG tablet Take 5 mg by mouth 2 (two) times daily.   Yes [provider]  ascorbic acid (VITAMIN C) 250 MG tablet Take  250 mg by mouth at bedtime. Take 1 tablet by mouth with the 325mg  iron tablet.   Yes [provider]  calcitRIOL (ROCALTROL) 0.25 MCG capsule Take 0.25 mcg by mouth daily.   Yes [provider]  Cholecalciferol 50 MCG (2000 UT) CAPS Take 1 capsule by mouth daily. Take with 0.4mg  of Tamsulosin   Yes [provider]  clopidogrel (PLAVIX) 75 MG tablet Take 75 mg by mouth daily.   Yes [provider]  cycloSPORINE modified (NEORAL) 100 MG capsule Take 125 mg by mouth 2 (two) times daily.   Yes [provider]  epoetin alfa (EPOGEN) 10000 UNIT/ML injection Inject 10,000 Units into the skin once. Give every 10 days   Yes [provider]  ferrous sulfate 325 (65 FE)  MG EC tablet Take 325 mg by mouth at bedtime. Take 1 tablet by mouth with the 250mg  vitamin c tablet.   Yes [provider]  finasteride (PROSCAR) 5 MG tablet Take 5 mg by mouth every evening.   Yes [provider]  furosemide (LASIX) 20 MG tablet Take 20 mg by mouth every other day.   Yes [provider]  hydrALAZINE (APRESOLINE) 100 MG tablet Take 100 mg by mouth 2 (two) times daily.   Yes [provider]  insulin glargine (LANTUS SOLOSTAR) 100 UNIT/ML Solostar Pen Inject 0-6 Units into the skin at bedtime. If BS>130-Take 6 units, Increase 1 unit for every 10 mg/dl above 259.   Yes [provider]  levETIRAcetam (KEPPRA) 750 MG tablet Take 1 tablet (750 mg total) by mouth 2 (two) times daily. 04/29/23  Yes Gerhard Munch, MD  losartan (COZAAR) 50 MG tablet Take 50 mg by mouth in the morning and at bedtime.   Yes [provider]  mirtazapine (REMERON) 15 MG tablet Take 0.5 tablets (7.5 mg total) by mouth at bedtime. Patient taking differently: Take 15 mg by mouth at bedtime. 01/04/22  Yes Rolly Salter, MD  mycophenolate (MYFORTIC) 180 MG EC tablet Take 180 mg by mouth 2 (two) times daily.   Yes [provider]  Nutritional Supplements (ENSURE HIGH PROTEIN) LIQD Take 237 mLs by mouth 2 (two) times daily. 10/12/23  Yes [provider]  rosuvastatin (CRESTOR) 5 MG tablet Take 5 mg by mouth every evening.   Yes [provider]  tamsulosin (FLOMAX) 0.4 MG CAPS capsule Take 0.4 mg by mouth daily. Take 1 tablet by mouth with Vit D3 2000iu   Yes [provider]  MAGNESIUM OXIDE 400 PO Take 400 mg by mouth daily. Patient not taking: Reported on 11/21/2023    [provider]  omeprazole (PRILOSEC) 20 MG capsule Take 20 mg by mouth daily as needed. Patient not taking: Reported on 09/28/2023    [provider]     Vitals:   11/21/23 1313 11/21/23 1520 11/21/23 1523 11/21/23 1536  BP:    127/64   Pulse:  86    Resp:  (!) 22    Temp: 98.5 F (36.9 C)   97.8 F (36.6 C)  TempSrc: Axillary   Oral  SpO2:  98% 98%   Weight:      Height:       Exam Gen alert, ^'d wob, elderly man, not very verbal No rash, cyanosis or gangrene Sclera anicteric, throat clear  No jvd or bruits Chest occ crackles, mostly clear bilat RRR no MRG Abd soft ntnd no mass or ascites +bs GU nl male MS no joint effusions or  deformity Ext 1-2+ bilat pretib edema, bilat 1-2+ UE edema LE Neuro as above     Renal-related home meds: Hydralazine 100 bid Myfortic 180 bid Norvasc 5 bid Cyclosporine 125mg  bid Lasix 20 mg every other day Losartan 50mg  hs Others: flomax, statin, keppra, proscar, plavix, PPI  Date   Creat  eGFR (ml/min) 2022   1.88- 2.00 34- 37 ml/min Mar- dec 2023  2.80 >> 1.65 23 >> 43 ml/min Aug- sept 2024 2.44  27 ml/min   1/27- 10/04/23  2.43 >> 1.57  27 >> 41 ml/min  3/21   2.50 11/21/23  2.32  28     Assessment/ Plan: 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 Creat here 2.5 on admission, down to 2.3 today. H/o renal transplant 2016. Doesn't have a life-threatening infection that I can see, will resume his myfortic 180 bid. Cyclosporine resumed earlier this am. Pt is vol overloaded w/ diffuse edema. CXR not convincing for lung edema, nonetheless there is vasc congestion and pt's WOB is heavy. Would favor high dose IV lasix at this time. We can lower BP meds if BP's start to drop w/ diuresis. Will follow.  Metabolic acidosis - serum bicarb is 14, will start sod bicarb tablets 650 bid, can ^ if/when his volume comes under control.  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  Volume overload - diffuse edema, not sure cause. CHF would be my 1st guess. ECHO done, normal LVEF.  HTN - hold losartan for now, cont hydralazine / norvasc. Titrate down if needed if bp's come down too low.  H/o CVA - w/ hemiparesis    Vinson Moselle  MD [facility] 11/21/2023, 4:07 PM  Recent Labs  Lab 11/20/23 2000 11/20/23 2017 11/20/23 2057 11/21/23 0515  HGB  --  11.2* 9.2* 9.3*  ALBUMIN 3.2*  --   --   --   CALCIUM 10.9*  --   --  10.6*  CREATININE 2.35*  --   --  2.32*  K 4.6 5.0  --  4.5   Inpatient medications:  amLODipine  5 mg Oral BID   budesonide (PULMICORT) nebulizer solution  0.25 mg Nebulization BID   calcitRIOL  0.25 mcg Oral Daily   [START ON 11/22/2023] clopidogrel  75 mg Oral Daily   cycloSPORINE modified  125 mg Oral BID   finasteride  5 mg Oral QPM   furosemide  20 mg Oral QODAY   heparin  5,000 Units Subcutaneous Q8H   hydrALAZINE  100 mg Oral Q12H   insulin aspart  0-15 Units Subcutaneous TID WC   ipratropium  0.5 mg Nebulization Q6H   levalbuterol  0.63 mg Nebulization Q6H   levETIRAcetam  750 mg Oral BID   rosuvastatin  5 mg Oral QPM   sodium bicarbonate  1,300 mg Oral BID   tamsulosin  0.4 mg Oral Daily    ceFEPime (MAXIPIME) IV 2 g (11/21/23 1121)   [START ON 11/22/2023] vancomycin     acetaminophen **OR** acetaminophen, melatonin, senna-docusate

## 2023-11-21 NOTE — ED Notes (Signed)
 IV team at bedside

## 2023-11-21 NOTE — Progress Notes (Signed)
 TR[facility] night cross cover note:  CBG monitoring changed from q4 h to achs w/ associated ssi coverage.     Newton Pigg, DO Hospitalist

## 2023-11-21 NOTE — Evaluation (Signed)
 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 male with PMH: CVA, dementia, dysphagia, HTN, s/p liver and kidney transplant, DM-2, h/o seizures. He was hospitalized in late January to early February of this year with SOB, cough, fever. He presented to hospital on 11/21/23 after sliding out of the hoyer lift at home as well as AMS, lethargy, heavy breathing pattern. In ED, SpO2 95% on RA but ABG pH 7.33, pCO2 26 and he was put on 4-5L oxygen via [place]. CXR showed vascular congestion, no overt edema. He had right thoracentesis on 3/22 which yielded of amber vs blood-tinged fluid. Patient made NPO awaiting SLP evaluation of swallow.    Assessment / Plan / Recommendation  Clinical Impression  Patient presents with clinical s/s of dysphagia as per this bedside swallow evaluation but with SLP suspecting he is close to his baseline since recent admisison. Daughter reported that at home, he liked to drink liquids more than eat solids and they continued to use the nectar thick thickener ([employer] is covering the Simply Thick thickener) and he is eating solid foods that are in small pieces. SLP assessed patient's  swallow function via nectar thick liquids via straw sips and puree solids (pudding). Patient with suspected delayed swallow initiation but no overt s/s aspiration. No oral holding or pocketing of liquids or pudding. (daughter does report h/o pocketing/holding pills in mouth at home. SLP recommending advanced to Dys 2(minced) solids, nectar thick liquids. SLP will follow for toleration. SLP Visit Diagnosis: Dysphagia, oropharyngeal phase (R13.12)    Aspiration Risk  Mild aspiration risk    Diet Recommendation Dysphagia 2 (Fine chop);Nectar-thick liquid    Liquid Administration via: Cup;Straw Medication Administration: Whole meds with puree Supervision: Full supervision/cueing for compensatory strategies;Staff to assist with self feeding Compensations: Slow rate;Small sips/bites Postural Changes: Seated upright at 90 degrees    Other  Recommendations Oral Care Recommendations: Oral care BID;Staff/trained caregiver to provide oral care    Recommendations for follow up therapy are one component of a multi-disciplinary discharge planning process, led by the attending physician.  Recommendations may be updated based on patient status, additional functional criteria and insurance authorization.  Follow up Recommendations Other (comment) (TBD)      Assistance Recommended at Discharge    Functional Status Assessment Patient has had a recent decline in their functional status and demonstrates the ability to make significant improvements in function in a reasonable and predictable amount of time.  Frequency and Duration min 1 x/week  1 week       Prognosis Prognosis for improved oropharyngeal function: Fair Barriers to  Reach Goals: Cognitive deficits;Time post onset      Swallow Study   General Date of Onset: 11/21/23 HPI: Patient is a 78 y.o. male with PMH: CVA, dementia, dysphagia, HTN, s/p liver and kidney transplant, DM-2, h/o seizures. He was hospitalized in late January to early  February of this year with SOB, cough, fever. He presented to hospital on 11/21/23 after sliding out of the hoyer lift at home as well as AMS, lethargy, heavy breathing pattern. In ED, SpO2 95% on RA but ABG pH 7.33, pCO2 26 and he was put on 4-5L oxygen via [place]. CXR showed vascular congestion, no overt edema. He had right thoracentesis on 3/22 which yielded of amber vs blood-tinged fluid. Patient made NPO awaiting SLP evaluation of swallow. Type of Study: Bedside Swallow Evaluation Previous Swallow Assessment: during admission Jan/Feb 2025; BSE, MBS, etc Diet Prior to this Study: NPO Temperature Spikes Noted: No Respiratory Status: Nasal cannula History of Recent Intubation: No Behavior/Cognition: Alert;Cooperative Oral Cavity Assessment: Within Functional Limits Oral Care Completed by SLP: No Oral Cavity - Dentition: Dentures, top;Dentures, bottom Self-Feeding Abilities: Needs assist Patient Positioning: Upright in bed Baseline Vocal Quality: Not observed Volitional Cough: Cognitively unable to elicit Volitional Swallow: Unable to elicit    Oral/Motor/Sensory Function Overall Oral Motor/Sensory Function: Within functional limits   Ice Chips     Thin Liquid Thin Liquid: Not tested    Nectar Thick Nectar Thick Liquid: Impaired Presentation: Straw Pharyngeal Phase Impairments: Suspected delayed Swallow   Honey Thick     Puree Puree: Within functional limits Presentation: Spoon   Solid     Solid: Not tested     Angela Nevin, MA, CCC-SLP Speech Therapy

## 2023-11-21 NOTE — H&P (Addendum)
 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  Today patient slid out of the [facility] lift.  The family was able to get him back to bed but his blood pressure was low at 70/50.  He was very very lethargic and they could not get him to focus.  The patient had a heavy breathing pattern.  Per family he has always had erratic breathing pattern but this heavy breathing pattern started last 2 weeks.  They are unaware if he hit his head, there is no reports of loss of consciousness.  There is reports of fever, chills, nausea, vomiting.  He was brought to the ER because of the weakness.  At baseline the patient is full care.  He does not feed himself or speak much.  In the ER vital stable, 94 to 97% on room air.  pH 7.35.  Creatinine 2.5, baseline 1.57.  Respiratory panel normal CT chest shows moderate, loculated right pleural effusion with associated atelectasis or consolidation. Trauma workup negative findings Admission requested. History provided by patient's daughter is present at bedside.   Review of Systems:  Per HPI.  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 Authorizing Provider  acetaminophen (TYLENOL) 500 MG tablet Take 1,000 mg by mouth every 6 (six) hours  as needed for moderate pain, headache or fever. Patient not taking: Reported on 09/28/2023    [provider]  albuterol (VENTOLIN HFA) 108 (90 Base) MCG/ACT inhaler Inhale 2 puffs into the lungs every 6 (six) hours as needed for wheezing or shortness of breath. Patient not taking: Reported on 09/28/2023    [provider]  amLODipine (NORVASC) 10 MG tablet Take 10 mg by mouth daily.    [provider]  ascorbic acid (VITAMIN C) 250 MG tablet Take 250 mg by mouth daily. Take 1 tablet by mouth with the 325mg  iron tablet.    [provider]  calcitRIOL (ROCALTROL) 0.25 MCG capsule Take 0.25 mcg by mouth daily.    [provider]  clopidogrel (PLAVIX) 75 MG tablet Take 75 mg by mouth daily.    [provider]  cycloSPORINE modified (NEORAL) 100 MG capsule Take 150 mg by mouth 2 (two) times daily.    [provider]  Ensure (ENSURE) Take 1 Can by mouth daily as needed. Patient not taking: Reported on 09/28/2023    [provider]  epoetin alfa (EPOGEN) 10000 UNIT/ML injection Inject 10,000 Units into the skin every 14 (fourteen) days.    [provider]  Ergocalciferol (VITAMIN D2) 50 MCG (2000 UT) TABS Take 1 tablet by mouth daily. Take 1 tablet by mouth with 0.4mg  of Tamsulosin.  [provider]  ferrous sulfate 325 (65 FE) MG EC tablet Take 325 mg by mouth daily. Take 1 tablet by mouth with the 250mg  vitamin c tablet.    [provider]  finasteride (PROSCAR) 5 MG tablet Take 5 mg by mouth every evening.    [provider]  furosemide (LASIX) 20 MG tablet Take 20 mg by mouth every other day.    [provider]  hydrALAZINE (APRESOLINE) 50 MG tablet Take 1 tablet (50 mg total) by mouth 3 (three) times daily. Patient taking differently: Take 100 mg by mouth in the morning and at bedtime. 01/04/22   Rolly Salter, MD  insulin glargine (LANTUS SOLOSTAR) 100 UNIT/ML Solostar Pen Inject 0-6  Units into the skin at bedtime. If BS>130-Take 6 units, Increase 1 unit for every 10 mg/dl above 191.    [provider]  levETIRAcetam (KEPPRA) 750 MG tablet Take 1 tablet (750 mg total) by mouth 2 (two) times daily. 04/29/23   Gerhard Munch, MD  losartan (COZAAR) 50 MG tablet Take 50 mg by mouth in the morning and at bedtime.    [provider]  MAGNESIUM OXIDE 400 PO Take 400 mg by mouth daily.    [provider]  mirtazapine (REMERON) 15 MG tablet Take 0.5 tablets (7.5 mg total) by mouth at bedtime. Patient taking differently: Take 15 mg by mouth at bedtime. 01/04/22   Rolly Salter, MD  mycophenolate (MYFORTIC) 180 MG EC tablet Take 180 mg by mouth 2 (two) times daily.    [provider]  omeprazole (PRILOSEC) 20 MG capsule Take 20 mg by mouth daily as needed. Patient not taking: Reported on 09/28/2023    [provider]  rosuvastatin (CRESTOR) 5 MG tablet Take 5 mg by mouth every evening.    [provider]  sodium bicarbonate 650 MG tablet Take 1,300 mg by mouth 2 (two) times daily.    [provider]  tamsulosin (FLOMAX) 0.4 MG CAPS capsule Take 0.4 mg by mouth daily. Take 1 tablet by mouth with vitamin d 2000units.    [provider]    Allergies:   Allergies  Allergen Reactions   Levaquin [Levofloxacin] Itching   Linezolid     Seizures   Lisinopril Cough    Social History:  reports that he quit smoking about 40 years ago. His smoking use included cigarettes. He has never used smokeless tobacco. He reports that he does not drink alcohol and does not use drugs.  Family History: Family History  Problem Relation Age of Onset   Hypertension Mother    Diabetes Mother    Diabetes Father     Physical Exam: Vitals:   11/20/23 2039 11/20/23 2143 11/21/23 0015 11/21/23 0045  BP:   138/75 (!) 148/77  Pulse: 85  88 88  Resp: (!) 25  (!) 27 (!) 24  Temp: 98 F (36.7 C)     SpO2: 94%  95% 97%  Weight:   95.3 kg    Height:  6\' 1"  (1.854 m)      General: Alert, not oriented [baseline], well developed and nourished, no acute distress Eyes: Pink conjunctiva,  ENT: Moist oral mucosa,  Lungs: Decreased breath sounds on right.  Tachypneic, positive use of abdominal accessory muscles Cardiovascular: RRR,  no murmurs. No JVD Abdomen: soft, positive BS, nontender, not an acute abdomen GU: not examined Neuro: CN II - XII appears grossly intact Musculoskeletal: AV fistula right arm, muscular wasting. >3+ B/L LE  pitting edema Skin: No decubitus Psych: Demented patient  Labs on Admission:  Recent Labs    11/20/23 1959 11/20/23 2000 11/20/23 2017  NA 145 144 144  K 4.6 4.6 5.0  CL 122* 118*  --   CO2  --  15*  --   GLUCOSE 187* 191*  --   BUN 57* 62*  --   CREATININE 2.50* 2.35*  --   CALCIUM  --  10.9*  --    Recent Labs    11/20/23 2000  AST 18  ALT 19  ALKPHOS 73  BILITOT 0.9  PROT 7.6  ALBUMIN 3.2*    Recent Labs    11/20/23 2017 11/20/23 2057  WBC  --  11.1*  NEUTROABS  --  10.0*  HGB 11.2* 9.2*  HCT 33.0* 29.3*  MCV  --  77.1*  PLT  --  347     Micro Results: Recent Results (from the past 240 hours)  Resp panel by RT-PCR (RSV, Flu A&B, Covid) Anterior Nasal Swab     Status: None   Collection Time: 11/20/23  8:14 PM   Specimen: Anterior Nasal Swab  Result Value Ref Range Status   SARS Coronavirus 2 by RT PCR NEGATIVE NEGATIVE Final   Influenza A by PCR NEGATIVE NEGATIVE Final   Influenza B by PCR NEGATIVE NEGATIVE Final    Comment: (NOTE) The Xpert Xpress SARS-CoV-2/FLU/RSV plus assay is intended as an aid in the diagnosis of influenza from Nasopharyngeal swab specimens and should not be used as a sole basis for treatment. Nasal washings and aspirates are unacceptable for Xpert Xpress SARS-CoV-2/FLU/RSV testing.  Fact Sheet for Patients: BloggerCourse.com  Fact Sheet for Healthcare  Providers: SeriousBroker.it  This test is not yet approved or cleared by the [place] FDA and has been authorized for detection and/or diagnosis of SARS-CoV-2 by FDA under an Emergency Use Authorization (EUA). This EUA will remain in effect (meaning this test can be used) for the duration of the COVID-19 declaration under Section 564(b)(1) of the Act, 21 U.S.C. section 360bbb-3(b)(1), unless the authorization is terminated or revoked.     Resp Syncytial Virus by PCR NEGATIVE NEGATIVE Final    Comment: (NOTE) Fact Sheet for Patients: BloggerCourse.com  Fact Sheet for Healthcare Providers: SeriousBroker.it  This test is not yet approved or cleared by the [place] FDA and has been authorized for detection and/or diagnosis of SARS-CoV-2 by FDA under an Emergency Use Authorization (EUA). This EUA will remain in effect (meaning this test can be used) for the duration of the COVID-19 declaration under Section 564(b)(1) of the Act, 21 U.S.C. section 360bbb-3(b)(1), unless the authorization is terminated or revoked.  Performed at [facility], [address], [place], [place] [address]      Radiological Exams on Admission: CT CHEST ABDOMEN PELVIS WO CONTRAST Result Date: 11/20/2023 CLINICAL DATA:  Rib fracture suspected, traumatic EXAM: CT CHEST, ABDOMEN AND PELVIS WITHOUT CONTRAST TECHNIQUE: Multidetector CT imaging of the chest, abdomen and pelvis was performed following the standard protocol without IV contrast. RADIATION DOSE REDUCTION: This exam was performed according to the departmental dose-optimization program which includes automated exposure control, adjustment of the mA and/or kV according to patient size and/or use of iterative reconstruction technique. COMPARISON:  None Available. FINDINGS: Evaluation is generally limited by lack of intravenous contrast and breath motion artifact,  particularly in the lower abdomen, as well as streak artifact from patient arm positioning CT CHEST FINDINGS Cardiovascular: Aortic atherosclerosis. Cardiomegaly. Left coronary artery  calcifications. No pericardial effusion. Mediastinum/Nodes: No enlarged mediastinal, hilar, or axillary lymph nodes. Thyroid gland, trachea, and esophagus demonstrate no significant findings. Lungs/Pleura: Moderate, loculated right pleural effusion with associated atelectasis or consolidation. Musculoskeletal: No chest wall abnormality. No obvious fractures. Assessment for rib fracture is significantly limited by pervasive breath motion artifact throughout the chest. CT ABDOMEN PELVIS FINDINGS Hepatobiliary: No focal liver abnormality is seen. Status post cholecystectomy. No biliary dilatation. Pancreas: Unremarkable. No pancreatic ductal dilatation or surrounding inflammatory changes. Spleen: Normal in size without significant abnormality. Adrenals/Urinary Tract: Adrenal glands are unremarkable. 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 No hydronephrosis (series 5, image 70). Bladder is unremarkable. Stomach/Bowel: Stomach is within normal limits. Appendix appears normal. No evidence of bowel wall thickening, distention, or inflammatory changes. Large stool ball in the rectum. Vascular/Lymphatic: Aortic atherosclerosis. 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 No enlarged abdominal or pelvic lymph nodes. Reproductive: Prostatomegaly. Other: No abdominal wall hernia.  Anasarca.  No ascites. Musculoskeletal: No acute osseous findings. IMPRESSION: 1. Evaluation is generally limited by lack of intravenous contrast and breath motion artifact, particularly in the lower abdomen, as well as streak artifact from patient arm positioning. 2. No obvious fractures. Assessment for rib fracture is significantly limited by pervasive  breath motion artifact throughout the chest. 3. Moderate, loculated right pleural effusion with associated atelectasis or consolidation. 4. 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 No hydronephrosis. 5. Large stool ball in the rectum. 6. Prostatomegaly. 7. Coronary artery disease. Aortic Atherosclerosis (ICD10-I70.0). Electronically Signed   By: Jearld Lesch M.D.   On: 11/20/2023 20:56   CT Head Wo Contrast Result Date: 11/20/2023 CLINICAL DATA:  Recent fall with headaches and neck pain, initial encounter EXAM: CT HEAD WITHOUT CONTRAST CT CERVICAL SPINE WITHOUT CONTRAST TECHNIQUE: Multidetector CT imaging of the head and cervical spine was performed following the standard protocol without intravenous contrast. Multiplanar CT image reconstructions of the cervical spine were also generated. RADIATION DOSE REDUCTION: This exam was performed according to the departmental dose-optimization program which includes automated exposure control, adjustment of the mA and/or kV according to patient size and/or use of iterative reconstruction technique. COMPARISON:  04/29/2023 FINDINGS: CT HEAD FINDINGS Brain: No evidence of acute infarction, hemorrhage, hydrocephalus, extra-axial collection or mass lesion/mass effect. Mild atrophic and chronic white matter ischemic changes are noted. Vascular: No hyperdense vessel or unexpected calcification. Skull: Normal. Negative for fracture or focal lesion. Sinuses/Orbits: No acute finding. Other: None. CT CERVICAL SPINE FINDINGS Alignment: Within normal limits. Skull base and vertebrae: 7 cervical segments are well visualized. Vertebral body height is well maintained. Multilevel osteophytic and facet hypertrophic changes are noted. No acute fracture or acute facet abnormality is noted. The odontoid is within normal limits. Soft tissues and spinal canal: Surrounding soft tissue  structures are within normal limits. Disc levels:  Mild disc bulging is noted at C3-4 and C5-6. Upper chest: Pleural effusion is noted on the right Other: None IMPRESSION: CT of the head: No acute intracranial abnormality noted. Chronic atrophic and ischemic changes. CT of the cervical spine: Multilevel degenerative change without acute abnormality. Electronically Signed   By: Alcide Clever M.D.   On: 11/20/2023 20:43   CT Cervical Spine Wo Contrast Result Date: 11/20/2023 CLINICAL DATA:  Recent fall with headaches and neck pain, initial encounter EXAM: CT HEAD WITHOUT CONTRAST CT CERVICAL SPINE WITHOUT CONTRAST TECHNIQUE: Multidetector CT  imaging of the head and cervical spine was performed following the standard protocol without intravenous contrast. Multiplanar CT image reconstructions of the cervical spine were also generated. RADIATION DOSE REDUCTION: This exam was performed according to the departmental dose-optimization program which includes automated exposure control, adjustment of the mA and/or kV according to patient size and/or use of iterative reconstruction technique. COMPARISON:  04/29/2023 FINDINGS: CT HEAD FINDINGS Brain: No evidence of acute infarction, hemorrhage, hydrocephalus, extra-axial collection or mass lesion/mass effect. Mild atrophic and chronic white matter ischemic changes are noted. Vascular: No hyperdense vessel or unexpected calcification. Skull: Normal. Negative for fracture or focal lesion. Sinuses/Orbits: No acute finding. Other: None. CT CERVICAL SPINE FINDINGS Alignment: Within normal limits. Skull base and vertebrae: 7 cervical segments are well visualized. Vertebral body height is well maintained. Multilevel osteophytic and facet hypertrophic changes are noted. No acute fracture or acute facet abnormality is noted. The odontoid is within normal limits. Soft tissues and spinal canal: Surrounding soft tissue structures are within normal limits. Disc levels:  Mild disc bulging  is noted at C3-4 and C5-6. Upper chest: Pleural effusion is noted on the right Other: None IMPRESSION: CT of the head: No acute intracranial abnormality noted. Chronic atrophic and ischemic changes. CT of the cervical spine: Multilevel degenerative change without acute abnormality. Electronically Signed   By: Alcide Clever M.D.   On: 11/20/2023 20:43   DG Pelvis Portable Result Date: 11/20/2023 CLINICAL DATA:  Status post fall. EXAM: PORTABLE PELVIS 1-2 VIEWS COMPARISON:  None Available. FINDINGS: There is no evidence of pelvic fracture or diastasis. No pelvic bone lesions are seen. IMPRESSION: Negative. Electronically Signed   By: Aram Candela M.D.   On: 11/20/2023 20:26   DG Chest Port 1 View Result Date: 11/20/2023 CLINICAL DATA:  Status post fall. EXAM: PORTABLE CHEST 1 VIEW COMPARISON:  September 30, 2023 FINDINGS: The heart size and mediastinal contours are within normal limits. There is prominence of the central pulmonary vasculature. Low lung volumes are noted with mild atelectatic changes seen within the bilateral lung bases. There is no evidence of focal consolidation, pleural effusion or pneumothorax. The visualized skeletal structures are unremarkable. IMPRESSION: Low lung volumes with pulmonary vascular congestion and mild bibasilar atelectasis. Electronically Signed   By: Aram Candela M.D.   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  Monitor closely.  ABG done shows normal pH -Ultrasound-guided thoracentesis ordered for the a.m.   Bilateral lower extremity edema -May be secondary to dependent edema.  BNP 231.  However will order 2D echo and give IV Lasix   History of renal and liver transplant, CKD -Sodium  bicarb, Calcitrol resumed -PO Lasix on hold -Holding cyclosporine and Myfortic   Benign prostatic hyperplasia with lower urinary tract symptoms -Proscar, Flomax resumed   Cerebellar stroke syndrome// right sided weakness -Plavix resumed, start date 3/23   Seizures -Keppra resumed   Essential hypertension -Norvasc, HTN, Cozaar resumed   Severe dementia (HCC)  Jozalynn Noyce 11/21/2023, 1:57 AM

## 2023-11-21 NOTE — Progress Notes (Signed)
 Dear Doctor:  This patient has been identified as a candidate for CENTRAL LINE for the following reason (s): drug pH or osmolality (causing phlebitis, infiltration in 24 hours), poor veins/poor circulatory system (CHF, COPD, emphysema, diabetes, steroid use, IV drug abuse, etc.), and restarts due to phlebitis and infiltration in 24 hours   R arm with active AVG. VAST assessment of L arm with US showing no suitable veins noted for USGPIV, Midline, or PICC.  Thank you for supporting the early vascular access assessment program.

## 2023-11-21 NOTE — ED Notes (Signed)
 Dr. Joneen Roach and pt's daughter at bedside notified of IV infiltration. Per pharmacy instructions, warm compress applied and arm elevated. Dr. Joneen Roach notified IV team unable to find appropriate access site due to infiltration and recommend EJ. RN instructed by MD pt may wait until dayshift for IV team to reattempt obtaining access after swelling has decreased as all med orders are primarily PO.

## 2023-11-21 NOTE — Procedures (Signed)
 PROCEDURE SUMMARY:  Successful US guided right thoracentesis. Yielded 400 mL of amber vs. Blood-tinged fluid. Pt tolerated procedure fair.  Currently his vital signs are stable on 4L, however patient appears uncomfortable with increased respiratory effort. No immediate complications.  Specimen was saved for labs.  Dr. Dartha Lodge made aware sample available for testing as well as patient status.  CXR ordered.  EBL < 5 mL  Hoyt Koch PA-C 11/21/2023 10:23 AM

## 2023-11-21 NOTE — ED Notes (Signed)
 Pt placed on [facility] per Dr. Joneen Roach request due to tachypnea. Room saturations 95%.

## 2023-11-21 NOTE — Progress Notes (Signed)
 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  Patient was admitted earlier today with history of hypotension (blood pressure 70/50 mmHg), shortness of breath, lethargy, pulmonary vascular congestion, bibasilar atelectasis, mild worsening of renal function (baseline CKD 3B/3a), elevated cardiac BNP (231), CO2 of 14, and mild leukocytosis (11.1) with left shift.  H&P note documents possible pneumonia with loculated pleural effusion.  Patient has undergone ultrasound-guided right thoracentesis that yielded 400 mL of amber versus blood-tinged fluid.  Pleural fluid analysis is still pending.  11/21/2023: Patient seen alongside patient's daughter.  No significant history from the patient. The history came from patient's daughter.  Patient is known to the transplant team based at the [facility], [place] (Dr. Conception Chancy Roy-Chaudhury).  Patient has been on cyclosporine 125 Mg p.o. twice daily and Myfortic 180 Mg twice daily.  Baseline serum creatinine of 1.57.  Serum creatinine was 2.5 on presentation.  Patient is also volume overloaded.  No fever or chills currently.  The daughter thinks patient may have had fever recently.  Will diurese the patient.  Will follow results of the echocardiogram.  Nephrology team has been consulted.  Cyclosporine and Myfortic were held by the admitting provider.  Will likely restart cyclosporine.  Further management will depend on hospital course.  The patient's nurse called me to inform me that the patient has some breathing difficulties.  Repeat chest x-ray is negative for pneumothorax  (as documented above, patient underwent thoracentesis earlier today).  Repeat chest x-ray did not reveal any lung infiltrates.  Atelectasis were reported.  Low lung volumes also reported.  Not certain if patient has component of aerophagia.  Will proceed with CT chest.  Check procalcitonin.  Cycle troponins.  Nephrology team is increase IV Lasix to 100 Mg 3 times daily.  Input from nephrology team is highly appreciated.  Reviewed patient.  Patient looks a lot calmer.  Further management will depend on hospital course.  Echocardiogram revealed: 1. Left ventricular ejection fraction, by estimation, is 70 to 75%. The  left ventricle has hyperdynamic function. The left ventricle has no  regional wall motion abnormalities. There is mild left ventricular  hypertrophy. Left ventricular diastolic  parameters are indeterminate.   2. Right ventricular systolic function is normal. The right ventricular  size is normal.   3. Left atrial size was mildly dilated.   4. The mitral valve is normal in structure. No evidence of mitral valve  regurgitation. No evidence of mitral stenosis. Moderate mitral annular  calcification.   5. The aortic valve is calcified. There is moderate calcification of the  aortic valve. There is mild thickening of the aortic valve. Aortic valve  regurgitation is trivial. Mild aortic valve stenosis. Aortic valve area,  by VTI measures 2.34 cm. Aortic  valve mean gradient measures 12.0 mmHg. Aortic valve Vmax measures 2.12  m/s.   6. There is Moderate (Grade III) plaque involving the aortic root.   7. The inferior vena cava is normal in size with greater than 50%  respiratory variability, suggesting right atrial pressure of 3 mmHg.    Assessment & Plan:   Principal Problem:   Community acquired pneumonia Active Problems:   Seizure (HCC)  History of renal transplant   Hemiplegia, unspecified affecting unspecified side (HCC)   Essential hypertension   Cerebellar stroke  syndrome   Benign prostatic hyperplasia with lower urinary tract symptoms   Dementia (HCC)   Suspect acute versus acute on chronic diastolic CHF: Hypoxic respiratory failure, likely acute: -Continue IV Lasix. -Echo findings as documented above. -Significantly impaired renal function noted, therefore, we will hold GDMT for now. -AKI may be cardiorenal. -Monitor renal function and electrolytes. -Strict I's and O's. -Monitor volume status (patient is clinically volume overloaded). -Further management depend on hospital course. -Nebs Xopenex, Atrovent and Pulmicort.  Previously documented community-acquired pneumonia as per H&P: -Procalcitonin was 0.16. -CT chest has been ordered. -Myfortic is currently on hold. -Patient is currently on IV cefepime and vancomycin. -Speech therapy input is highly appreciated. -Further management depend on hospital course.  Acute kidney injury of cadaveric transplanted kidney on chronic kidney disease stage IIIb/IIIa: -Suspect AKI is cardiorenal. -Baseline serum creatinine of 1.57. -On presentation, serum creatinine was 2.5.  Serum creatinine is down to 2.32. -Follow repeat renal panel in the morning. -Patient is s/p combined cadaveric kidney and liver transplantation. -See above documentation.  Acidosis: -CO2 of 14 is noted. -Baseline CO2 of 17-19, likely secondary to liver disease.  Patient is status post liver transplantation. -Worsening kidney injury noted. -Cautious use of sodium bicarbonate. -Follow CO2. -Normal anion gap.  Status post combined cadaveric kidney and liver transplantation: -[facility]. -Maintained on cyclosporine and Myfortic. -Myfortic is currently on hold. -Mild worsening of renal function, suspect cardiorenal. -Baseline serum creatinine of 1.57. -Serum creatinine of 2.5 on presentation. -Normal AST and ALT. -Local nephrology team has been consulted.   -Monitor renal function and  electrolytes. -Monitor LFTs.  Advanced dementia: -No behavioral problems.  Hypotension/hypertension: -Significant hypotension prior to admission reported.  See above documentation. -Blood pressure is currently stable. -Continue to monitor closely.  DVT prophylaxis: Subcutaneous heparin. Code Status: Full code.  Discussed with patient's daughter. Family Communication: Patient's daughter. Disposition Plan: Patient remains inpatient.   Consultants:  Nephrology.  Procedures:  Echocardiogram  Antimicrobials:  IV cefepime. IV vancomycin   Subjective: No significant history from patient.  Objective: Vitals:   11/21/23 0715 11/21/23 0730 11/21/23 0745 11/21/23 0910  BP: (!) 120/59 137/62 (!) 121/57   Pulse: 92 89 86   Resp: (!) 22 (!) 22 (!) 24   Temp:    98 F (36.7 C)  TempSrc:      SpO2: 100% 99% 100%   Weight:      Height:        Intake/Output Summary (Last 24 hours) at 11/21/2023 1010 Last data filed at 11/21/2023 0244 Gross per 24 hour  Intake 404.17 ml  Output 216 ml  Net 188.17 ml   Filed Weights   11/20/23 2143  Weight: 95.3 kg    Examination:  General exam: Presently, patient is calm.  JVD is difficult to assess. Respiratory system: Clear to auscultation.  Cardiovascular system: S1 & S2 Gastrointestinal system: Abdomen is obese versus gaseous distention.  Nontender. Central nervous system: Awake and alert.   Extremities: 2-2+ edema of lower extremities.  Data Reviewed: I have personally reviewed following labs and imaging studies  CBC: Recent Labs  Lab 11/20/23 1959 11/20/23 2017 11/20/23 2057 11/21/23 0515  WBC  --   --  11.1* 9.2  NEUTROABS  --   --  10.0* 7.8*  HGB 10.9* 11.2* 9.2* 9.3*  HCT 32.0* 33.0* 29.3* 29.9*  MCV  --   --  77.1* 77.9*  PLT  --   --  347 353   Basic Metabolic Panel: Recent Labs  Lab 11/20/23 1959 11/20/23 2000 11/20/23 2017 11/21/23 0515  NA 145 144 144 145  K 4.6 4.6 5.0 4.5  CL 122* 118*  --  119*   CO2  --  15*  --  14*  GLUCOSE 187* 191*  --  140*  BUN 57* 62*  --  61*  CREATININE 2.50* 2.35*  --  2.32*  CALCIUM  --  10.9*  --  10.6*   GFR: Estimated Creatinine Clearance: 30.6 mL/min (A) (by C-G formula based on SCr of 2.32 mg/dL (H)). Liver Function Tests: Recent Labs  Lab 11/20/23 2000  AST 18  ALT 19  ALKPHOS 73  BILITOT 0.9  PROT 7.6  ALBUMIN 3.2*   No results for input(s): "LIPASE", "AMYLASE" in the last 168 hours. No results for input(s): "AMMONIA" in the last 168 hours. Coagulation Profile: Recent Labs  Lab 11/20/23 2000  INR 1.2   Cardiac Enzymes: No results for input(s): "CKTOTAL", "CKMB", "CKMBINDEX", "TROPONINI" in the last 168 hours. BNP (last 3 results) No results for input(s): "PROBNP" in the last 8760 hours. HbA1C: No results for input(s): "HGBA1C" in the last 72 hours. CBG: Recent Labs  Lab 11/21/23 0938  GLUCAP 143*   Lipid Profile: No results for input(s): "CHOL", "HDL", "LDLCALC", "TRIG", "CHOLHDL", "LDLDIRECT" in the last 72 hours. Thyroid Function Tests: No results for input(s): "TSH", "T4TOTAL", "FREET4", "T3FREE", "THYROIDAB" in the last 72 hours. Anemia Panel: No results for input(s): "VITAMINB12", "FOLATE", "FERRITIN", "TIBC", "IRON", "RETICCTPCT" in the last 72 hours. Urine analysis:    Component Value Date/Time   COLORURINE YELLOW 11/21/2023 0010   APPEARANCEUR CLEAR 11/21/2023 0010   LABSPEC 1.020 11/21/2023 0010   PHURINE 5.5 11/21/2023 0010   GLUCOSEU 100 (A) 11/21/2023 0010   HGBUR NEGATIVE 11/21/2023 0010   BILIRUBINUR NEGATIVE 11/21/2023 0010   KETONESUR NEGATIVE 11/21/2023 0010   PROTEINUR 30 (A) 11/21/2023 0010   NITRITE NEGATIVE 11/21/2023 0010   LEUKOCYTESUR SMALL (A) 11/21/2023 0010   Sepsis Labs: @LABRCNTIP (procalcitonin:4,lacticidven:4)  ) Recent Results (from the past 240 hours)  Resp panel by RT-PCR (RSV, Flu A&B, Covid) Anterior Nasal Swab     Status: None   Collection Time: 11/20/23  8:14 PM    Specimen: Anterior Nasal Swab  Result Value Ref Range Status   SARS Coronavirus 2 by RT PCR NEGATIVE NEGATIVE Final   Influenza A by PCR NEGATIVE NEGATIVE Final   Influenza B by PCR NEGATIVE NEGATIVE Final    Comment: (NOTE) The Xpert Xpress SARS-CoV-2/FLU/RSV plus assay is intended as an aid in the diagnosis of influenza from Nasopharyngeal swab specimens and should not be used as a sole basis for treatment. Nasal washings and aspirates are unacceptable for Xpert Xpress SARS-CoV-2/FLU/RSV testing.  Fact Sheet for Patients: BloggerCourse.com  Fact Sheet for Healthcare Providers: SeriousBroker.it  This test is not yet approved or cleared by the [place] FDA and has been authorized for detection and/or diagnosis of SARS-CoV-2 by FDA under an Emergency Use Authorization (EUA). This EUA will remain in effect (meaning this test can be used) for the duration of the COVID-19 declaration under Section 564(b)(1) of the Act, 21 U.S.C. section 360bbb-3(b)(1), unless the authorization is terminated or revoked.     Resp Syncytial Virus by PCR NEGATIVE NEGATIVE Final    Comment: (NOTE) Fact Sheet for Patients: BloggerCourse.com  Fact Sheet for Healthcare Providers: SeriousBroker.it  This test is not  yet approved or cleared by the [place] and has been authorized for detection and/or diagnosis of SARS-CoV-2 by FDA under an Emergency Use Authorization (EUA). This EUA will remain in effect (meaning this test can be used) for the duration of the COVID-19 declaration under Section 564(b)(1) of the Act, 21 U.S.C. section 360bbb-3(b)(1), unless the authorization is terminated or revoked.  Performed at [facility], [address] [place], [place] [address]          Radiology Studies: CT CHEST ABDOMEN PELVIS WO CONTRAST Result Date: 11/20/2023 CLINICAL DATA:  Rib  fracture suspected, traumatic EXAM: CT CHEST, ABDOMEN AND PELVIS WITHOUT CONTRAST TECHNIQUE: Multidetector CT imaging of the chest, abdomen and pelvis was performed following the standard protocol without IV contrast. RADIATION DOSE REDUCTION: This exam was performed according to the departmental dose-optimization program which includes automated exposure control, adjustment of the mA and/or kV according to patient size and/or use of iterative reconstruction technique. COMPARISON:  None Available. FINDINGS: Evaluation is generally limited by lack of intravenous contrast and breath motion artifact, particularly in the lower abdomen, as well as streak artifact from patient arm positioning CT CHEST FINDINGS Cardiovascular: Aortic atherosclerosis. Cardiomegaly. Left coronary artery calcifications. No pericardial effusion. Mediastinum/Nodes: No enlarged mediastinal, hilar, or axillary lymph nodes. Thyroid gland, trachea, and esophagus demonstrate no significant findings. Lungs/Pleura: Moderate, loculated right pleural effusion with associated atelectasis or consolidation. Musculoskeletal: No chest wall abnormality. No obvious fractures. Assessment for rib fracture is significantly limited by pervasive breath motion artifact throughout the chest. CT ABDOMEN PELVIS FINDINGS Hepatobiliary: No focal liver abnormality is seen. Status post cholecystectomy. No biliary dilatation. Pancreas: Unremarkable. No pancreatic ductal dilatation or surrounding inflammatory changes. Spleen: Normal in size without significant abnormality. Adrenals/Urinary Tract: Adrenal glands are unremarkable. 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 No hydronephrosis (series 5, image 70). Bladder is unremarkable. Stomach/Bowel: Stomach is within normal limits. Appendix appears normal. No evidence of bowel wall thickening, distention, or inflammatory changes.  Large stool ball in the rectum. Vascular/Lymphatic: Aortic atherosclerosis. 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 No enlarged abdominal or pelvic lymph nodes. Reproductive: Prostatomegaly. Other: No abdominal wall hernia.  Anasarca.  No ascites. Musculoskeletal: No acute osseous findings. IMPRESSION: 1. Evaluation is generally limited by lack of intravenous contrast and breath motion artifact, particularly in the lower abdomen, as well as streak artifact from patient arm positioning. 2. No obvious fractures. Assessment for rib fracture is significantly limited by pervasive breath motion artifact throughout the chest. 3. Moderate, loculated right pleural effusion with associated atelectasis or consolidation. 4. 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 No hydronephrosis. 5. Large stool ball in the rectum. 6. Prostatomegaly. 7. Coronary artery disease. Aortic Atherosclerosis (ICD10-I70.0). Electronically Signed   By: Jearld Lesch M.D.   On: 11/20/2023 20:56   CT Head Wo Contrast Result Date: 11/20/2023 CLINICAL DATA:  Recent fall with headaches and neck pain, initial encounter EXAM: CT HEAD WITHOUT CONTRAST CT CERVICAL SPINE WITHOUT CONTRAST TECHNIQUE: Multidetector CT imaging of the head and cervical spine was performed following the standard protocol without intravenous contrast. Multiplanar CT image reconstructions of the cervical spine were also generated. RADIATION DOSE REDUCTION: This exam was performed according to the departmental dose-optimization program which includes automated exposure control, adjustment of the mA and/or kV according to patient size and/or use of iterative reconstruction technique. COMPARISON:  04/29/2023  FINDINGS: CT HEAD FINDINGS Brain: No evidence of acute infarction, hemorrhage, hydrocephalus, extra-axial collection or mass lesion/mass effect. Mild  atrophic and chronic white matter ischemic changes are noted. Vascular: No hyperdense vessel or unexpected calcification. Skull: Normal. Negative for fracture or focal lesion. Sinuses/Orbits: No acute finding. Other: None. CT CERVICAL SPINE FINDINGS Alignment: Within normal limits. Skull base and vertebrae: 7 cervical segments are well visualized. Vertebral body height is well maintained. Multilevel osteophytic and facet hypertrophic changes are noted. No acute fracture or acute facet abnormality is noted. The odontoid is within normal limits. Soft tissues and spinal canal: Surrounding soft tissue structures are within normal limits. Disc levels:  Mild disc bulging is noted at C3-4 and C5-6. Upper chest: Pleural effusion is noted on the right Other: None IMPRESSION: CT of the head: No acute intracranial abnormality noted. Chronic atrophic and ischemic changes. CT of the cervical spine: Multilevel degenerative change without acute abnormality. Electronically Signed   By: Alcide Clever M.D.   On: 11/20/2023 20:43   CT Cervical Spine Wo Contrast Result Date: 11/20/2023 CLINICAL DATA:  Recent fall with headaches and neck pain, initial encounter EXAM: CT HEAD WITHOUT CONTRAST CT CERVICAL SPINE WITHOUT CONTRAST TECHNIQUE: Multidetector CT imaging of the head and cervical spine was performed following the standard protocol without intravenous contrast. Multiplanar CT image reconstructions of the cervical spine were also generated. RADIATION DOSE REDUCTION: This exam was performed according to the departmental dose-optimization program which includes automated exposure control, adjustment of the mA and/or kV according to patient size and/or use of iterative reconstruction technique. COMPARISON:  04/29/2023 FINDINGS: CT HEAD FINDINGS Brain: No evidence of acute infarction, hemorrhage, hydrocephalus, extra-axial collection or mass lesion/mass effect. Mild atrophic and chronic white matter ischemic changes are noted.  Vascular: No hyperdense vessel or unexpected calcification. Skull: Normal. Negative for fracture or focal lesion. Sinuses/Orbits: No acute finding. Other: None. CT CERVICAL SPINE FINDINGS Alignment: Within normal limits. Skull base and vertebrae: 7 cervical segments are well visualized. Vertebral body height is well maintained. Multilevel osteophytic and facet hypertrophic changes are noted. No acute fracture or acute facet abnormality is noted. The odontoid is within normal limits. Soft tissues and spinal canal: Surrounding soft tissue structures are within normal limits. Disc levels:  Mild disc bulging is noted at C3-4 and C5-6. Upper chest: Pleural effusion is noted on the right Other: None IMPRESSION: CT of the head: No acute intracranial abnormality noted. Chronic atrophic and ischemic changes. CT of the cervical spine: Multilevel degenerative change without acute abnormality. Electronically Signed   By: Alcide Clever M.D.   On: 11/20/2023 20:43   DG Pelvis Portable Result Date: 11/20/2023 CLINICAL DATA:  Status post fall. EXAM: PORTABLE PELVIS 1-2 VIEWS COMPARISON:  None Available. FINDINGS: There is no evidence of pelvic fracture or diastasis. No pelvic bone lesions are seen. IMPRESSION: Negative. Electronically Signed   By: Aram Candela M.D.   On: 11/20/2023 20:26   DG Chest Port 1 View Result Date: 11/20/2023 CLINICAL DATA:  Status post fall. EXAM: PORTABLE CHEST 1 VIEW COMPARISON:  September 30, 2023 FINDINGS: The heart size and mediastinal contours are within normal limits. There is prominence of the central pulmonary vasculature. Low lung volumes are noted with mild atelectatic changes seen within the bilateral lung bases. There is no evidence of focal consolidation, pleural effusion or pneumothorax. The visualized skeletal structures are unremarkable. IMPRESSION: Low lung volumes with pulmonary vascular congestion and mild bibasilar atelectasis. Electronically Signed   By: Demetrius Revel.D.  On: 11/20/2023 20:25   Dr.     Shirline Frees Meds:  amLODipine  5 mg Oral BID   calcitRIOL  0.25 mcg Oral Daily   [START ON 11/22/2023] clopidogrel  75 mg Oral Daily   finasteride  5 mg Oral QPM   furosemide  20 mg Oral QODAY   heparin  5,000 Units Subcutaneous Q8H   hydrALAZINE  100 mg Oral Q12H   insulin aspart  0-15 Units Subcutaneous TID WC   levETIRAcetam  750 mg Oral BID   losartan  50 mg Oral QHS   rosuvastatin  5 mg Oral QPM   sodium bicarbonate  1,300 mg Oral BID   tamsulosin  0.4 mg Oral Daily   Continuous Infusions:  ceFEPime (MAXIPIME) IV     [START ON 11/22/2023] vancomycin       LOS: 0 days    Time spent: 55 minutes.    Berton Mount, MD  [facility] Pager #: [phone] 7PM-7AM contact night coverage as above

## 2023-11-21 NOTE — Progress Notes (Signed)
  2D Echocardiogram has been performed.  Gilbert Wilson 11/21/2023, 9:32 AM

## 2023-11-21 NOTE — ED Notes (Signed)
 IV discontinued, warm compress applied and arm elevated as recommended by pharmacy.

## 2023-11-21 NOTE — Progress Notes (Signed)
 ED Pharmacy Antibiotic Sign Off An antibiotic consult was received from an ED provider for Vancomycin and Cefepime per pharmacy dosing for HCAP. A chart review was completed to assess appropriateness.   The following one time order(s) were placed:  Vancomycin 2000 mg IV Cefepime 2 g IV   Further antibiotic and/or antibiotic pharmacy consults should be ordered by the admitting provider if indicated.   Eddie Candle, [facility]  Clinical Pharmacist 11/21/23 1:15 AM

## 2023-11-22 ENCOUNTER — Inpatient Hospital Stay (HOSPITAL_COMMUNITY)

## 2023-11-22 DIAGNOSIS — E8770 Fluid overload, unspecified: Secondary | ICD-10-CM | POA: Diagnosis not present

## 2023-11-22 DIAGNOSIS — J810 Acute pulmonary edema: Secondary | ICD-10-CM

## 2023-11-22 DIAGNOSIS — J90 Pleural effusion, not elsewhere classified: Secondary | ICD-10-CM | POA: Diagnosis not present

## 2023-11-22 DIAGNOSIS — I5033 Acute on chronic diastolic (congestive) heart failure: Secondary | ICD-10-CM | POA: Diagnosis not present

## 2023-11-22 LAB — GLUCOSE, CAPILLARY
Glucose-Capillary: 132 mg/dL — ABNORMAL HIGH (ref 70–99)
Glucose-Capillary: 157 mg/dL — ABNORMAL HIGH (ref 70–99)
Glucose-Capillary: 225 mg/dL — ABNORMAL HIGH (ref 70–99)
Glucose-Capillary: 225 mg/dL — ABNORMAL HIGH (ref 70–99)

## 2023-11-22 LAB — CBC WITH DIFFERENTIAL/PLATELET
Abs Immature Granulocytes: 0.04 10*3/uL (ref 0.00–0.07)
Basophils Absolute: 0.1 10*3/uL (ref 0.0–0.1)
Basophils Relative: 1 %
Eosinophils Absolute: 0.6 10*3/uL — ABNORMAL HIGH (ref 0.0–0.5)
Eosinophils Relative: 7 %
HCT: 27.1 % — ABNORMAL LOW (ref 39.0–52.0)
Hemoglobin: 8.7 g/dL — ABNORMAL LOW (ref 13.0–17.0)
Immature Granulocytes: 1 %
Lymphocytes Relative: 6 %
Lymphs Abs: 0.5 10*3/uL — ABNORMAL LOW (ref 0.7–4.0)
MCH: 24.5 pg — ABNORMAL LOW (ref 26.0–34.0)
MCHC: 32.1 g/dL (ref 30.0–36.0)
MCV: 76.3 fL — ABNORMAL LOW (ref 80.0–100.0)
Monocytes Absolute: 0.7 10*3/uL (ref 0.1–1.0)
Monocytes Relative: 8 %
Neutro Abs: 6.4 10*3/uL (ref 1.7–7.7)
Neutrophils Relative %: 77 %
Platelets: 309 10*3/uL (ref 150–400)
RBC: 3.55 MIL/uL — ABNORMAL LOW (ref 4.22–5.81)
RDW: 16.5 % — ABNORMAL HIGH (ref 11.5–15.5)
WBC: 8.2 10*3/uL (ref 4.0–10.5)
nRBC: 0 % (ref 0.0–0.2)

## 2023-11-22 LAB — RENAL FUNCTION PANEL
Albumin: 2.6 g/dL — ABNORMAL LOW (ref 3.5–5.0)
Anion gap: 13 (ref 5–15)
BUN: 62 mg/dL — ABNORMAL HIGH (ref 8–23)
CO2: 13 mmol/L — ABNORMAL LOW (ref 22–32)
Calcium: 10.2 mg/dL (ref 8.9–10.3)
Chloride: 121 mmol/L — ABNORMAL HIGH (ref 98–111)
Creatinine, Ser: 2.32 mg/dL — ABNORMAL HIGH (ref 0.61–1.24)
GFR, Estimated: 28 mL/min — ABNORMAL LOW (ref >60)
Glucose, Bld: 182 mg/dL — ABNORMAL HIGH (ref 70–99)
Phosphorus: 2.9 mg/dL (ref 2.5–4.6)
Potassium: 4.1 mmol/L (ref 3.5–5.1)
Sodium: 147 mmol/L — ABNORMAL HIGH (ref 135–145)

## 2023-11-22 LAB — MAGNESIUM: Magnesium: 2 mg/dL (ref 1.7–2.4)

## 2023-11-22 MED ORDER — SODIUM BICARBONATE 8.4 % IV SOLN
50.0000 meq | Freq: Once | INTRAVENOUS | Status: AC
  Administered 2023-11-22: 50 meq via INTRAVENOUS
  Filled 2023-11-22: qty 50

## 2023-11-22 MED ORDER — HYDRALAZINE HCL 50 MG PO TABS
50.0000 mg | ORAL_TABLET | Freq: Two times a day (BID) | ORAL | Status: DC
  Administered 2023-11-22: 50 mg via ORAL
  Filled 2023-11-22: qty 1

## 2023-11-22 MED ORDER — HYDRALAZINE HCL 25 MG PO TABS
25.0000 mg | ORAL_TABLET | Freq: Two times a day (BID) | ORAL | Status: DC
  Administered 2023-11-22: 25 mg via ORAL
  Filled 2023-11-22: qty 1

## 2023-11-22 MED ORDER — IPRATROPIUM-ALBUTEROL 0.5-2.5 (3) MG/3ML IN SOLN
3.0000 mL | Freq: Three times a day (TID) | RESPIRATORY_TRACT | Status: DC
  Administered 2023-11-22 – 2023-11-23 (×3): 3 mL via RESPIRATORY_TRACT
  Filled 2023-11-22 (×3): qty 3

## 2023-11-22 NOTE — Plan of Care (Signed)
   Problem: Coping: Goal: Ability to adjust to condition or change in health will improve Outcome: Progressing   Problem: Fluid Volume: Goal: Ability to maintain a balanced intake and output will improve Outcome: Progressing

## 2023-11-22 NOTE — Plan of Care (Signed)
  Problem: Coping: Goal: Ability to adjust to condition or change in health will improve Outcome: Progressing   Problem: Fluid Volume: Goal: Ability to maintain a balanced intake and output will improve Outcome: Progressing   Problem: Health Behavior/Discharge Planning: Goal: Ability to identify and utilize available resources and services will improve Outcome: Progressing Goal: Ability to manage health-related needs will improve Outcome: Progressing   Problem: Metabolic: Goal: Ability to maintain appropriate glucose levels will improve Outcome: Progressing   Problem: Nutritional: Goal: Maintenance of adequate nutrition will improve Outcome: Progressing Goal: Progress toward achieving an optimal weight will improve Outcome: Progressing   Problem: Skin Integrity: Goal: Risk for impaired skin integrity will decrease Outcome: Progressing   Problem: Tissue Perfusion: Goal: Adequacy of tissue perfusion will improve Outcome: Progressing   Problem: Clinical Measurements: Goal: Ability to maintain clinical measurements within normal limits will improve Outcome: Progressing Goal: Will remain free from infection Outcome: Progressing

## 2023-11-22 NOTE — Progress Notes (Signed)
 [facility] Progress Note  Subjective:  Seen in room No c/o's   Vitals:   11/22/23 0328 11/22/23 0400 11/22/23 0801 11/22/23 1041  BP: (!) 118/58  122/62 (!) 117/57  Pulse: 86 86 80 77  Resp:  20 20 20   Temp:   98.5 F (36.9 C) 98.2 F (36.8 C)  TempSrc:   Axillary Axillary  SpO2: 97% 96% 98% 94%  Weight: 92.2 kg     Height:        Exam: Gen alert, wob is better today, elderly man, not very verbal No jvd or bruits Chest clear bilat RRR no MRG Abd soft ntnd no mass or ascites +bs Ext 1-2+ bilat LE and UE edema  Neuro as above      Renal-related home meds: Hydralazine 100 bid Myfortic 180 bid Norvasc 5 bid Cyclosporine 125mg  bid Lasix 20 mg every other day Losartan 50mg  hs Others: flomax, statin, keppra, proscar, plavix, PPI   Date                             Creat               eGFR (ml/min) 2022                            1.88- 2.00        34- 37 ml/min Mar- dec 2023             2.80 >> 1.65    23 >> 43 ml/min Aug- sept 2024           2.44                 27 ml/min                     1/27- 10/04/23               2.43 >> 1.57    27 >> 41 ml/min           3/21                             2.50 11/21/23                        2.32                 28     Assessment/ Plan: 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 Creat here 2.5 on admission, down to 2.3 today. H/o renal transplant 2016. Resumed his myfortic and cyclosporine at home doses. Pt was vol overloaded w/ diffuse UE/ LE edema. CXR w/ vasc congestion and pt's wob was ^'d yesterday. Started high dose IV lasix, got decent responsed. Breathing looks better today. Cont diuresis.  Metabolic acidosis - serum bicarb mid-teens, started sod bicarb tablets 650 bid. Can increase dosing  if/when his volume comes under control.  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  Volume overload - diffuse LE/ UE edema. ECHO done, normal LVEF.  HTN - holding  losartan for now w/ AKI. BP's are down, norvasc dc'd. Hydralazine dosing lowered.  H/o CVA - w/ hemiparesis      Vinson Moselle MD  [facility] 11/22/2023, 11:30 AM  Recent Labs  Lab 11/20/23 2000 11/20/23 2017 11/21/23 0515 11/22/23 0213  HGB  --    < > 9.3* 8.7*  ALBUMIN 3.2*  --   --  2.6*  CALCIUM 10.9*  --  10.6* 10.2  PHOS  --   --   --  2.9  CREATININE 2.35*  --  2.32* 2.32*  K 4.6   < > 4.5 4.1   < > = values in this interval not displayed.   No results for input(s): "IRON", "TIBC", "FERRITIN" in the last 168 hours. Inpatient medications:  budesonide (PULMICORT) nebulizer solution  0.25 mg Nebulization BID   calcitRIOL  0.25 mcg Oral Daily   clopidogrel  75 mg Oral Daily   cycloSPORINE modified  125 mg Oral BID   finasteride  5 mg Oral QPM   heparin  5,000 Units Subcutaneous Q8H   hydrALAZINE  50 mg Oral Q12H   insulin aspart  0-15 Units Subcutaneous TID WC   levETIRAcetam  500 mg Oral BID   rosuvastatin  5 mg Oral QPM   sodium bicarbonate  1,300 mg Oral BID   tamsulosin  0.4 mg Oral Daily    ceFEPime (MAXIPIME) IV Stopped (11/21/23 2153)   furosemide 100 mg (11/22/23 0547)   vancomycin     acetaminophen **OR** acetaminophen, melatonin, senna-docusate

## 2023-11-22 NOTE — Progress Notes (Signed)
 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  Patient was admitted earlier today with history of hypotension (blood pressure 70/50 mmHg), shortness of breath, lethargy, pulmonary vascular congestion, bibasilar atelectasis, mild worsening of renal function (baseline CKD 3B/3a), elevated cardiac BNP (231), CO2 of 14, and mild leukocytosis (11.1) with left shift.  H&P note documents possible pneumonia with loculated pleural effusion.  Patient has undergone ultrasound-guided right thoracentesis that yielded 400 mL of amber versus blood-tinged fluid.  Pleural fluid analysis is still pending.  11/22/2023: Patient seen alongside patient's wife.  Patient's wife confirmed that patient smokes cigarettes for 30 to 40 years.  Patient has quit cigarette use.  Volume overload is improving.  Patient is on high-dose IV Lasix.  Acidosis is noted, with CO2 of 13.  CT chest result revealed small loculated right pleural effusion, right lower lobe patchy airspace disease, primarily felt to represent atelectasis.  Pneumonia cannot be ruled out.  Cardiomegaly and aortic atherosclerosis and emphysema reported.  BUN and creatinine have remained about send, despite aggressive diuresis.   Assessment & Plan:   Principal Problem:   Community acquired pneumonia Active Problems:   Seizure (HCC)   History of renal transplant   Hemiplegia, unspecified affecting unspecified side (HCC)   Essential hypertension   Cerebellar stroke syndrome   Benign prostatic hyperplasia with lower urinary  tract symptoms   Dementia (HCC)   Acute on chronic diastolic CHF (congestive heart failure) (HCC)   Suspect acute versus acute on chronic diastolic CHF: Hypoxic respiratory failure, likely acute versus acute on chronic: -Continue IV Lasix. -Continue nebs Pulmicort. -Start nebs DuoNeb. 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 -Echo findings as documented below. -CT chest without contrast result as documented below.. -Significantly impaired renal function noted, therefore, we will hold GDMT for now. -AKI likely cardiorenal. -Continue to monitor renal function and electrolytes. -Strict I's and O's. -Continue to monitor volume status (patient is clinically volume overloaded).  Previously documented community-acquired pneumonia as per H&P: -Procalcitonin was 0.16. -CT chest as documented below. -Likelihood of pneumonia seems low. -Discontinue IV vancomycin. -Have a low threshold to discontinue cefepime. -Myfortic and cyclosporine have been restarted. -Continue to assess closely.  Acute kidney injury of cadaveric transplanted kidney on chronic kidney disease stage IIIb/IIIa: -Suspect AKI is cardiorenal. -Baseline serum creatinine of 1.57. -On presentation, serum creatinine was 2.5.  Serum creatinine is down to 2.32. -Despite aggressive diuresis, BUN and serum creatinine have remained the same. -Continue to monitor renal function and electrolytes. -Avoid nephrotoxins. -Dose all medications assuming estimated GFR of 50 mL/min per 1.73 m. -Nephrology input is appreciated.   -Patient is s/p combined cadaveric kidney and liver transplantation.  Acidosis: -CO2 of 13. -Continue sodium bicarb. -Aim for CO2 level of around 18-20, considering history of liver disease. -Cautious use of sodium bicarbonate. -Follow CO2. -Normal anion gap.  Status post combined cadaveric kidney and liver transplantation: -[facility] -Maintained on cyclosporine and  Myfortic. -Myfortic and cyclosporine has been resumed.   -Normal AST and ALT. -Local nephrology team has been consulted.   -Monitor renal function and electrolytes. -Monitor LFTs.  Advanced dementia: -No behavioral problems.  Hypotension/hypertension: -Significant  hypotension prior to admission reported.  See above documentation. -Blood pressure is currently stable. -Continue to monitor closely.  DVT prophylaxis: Subcutaneous heparin. Code Status: Full code.  Discussed with patient's daughter. Family Communication: Patient's daughter. Disposition Plan: Patient remains inpatient.   Consultants:  Nephrology.  Procedures:  Echocardiogram revealed: 1. Left ventricular ejection fraction, by estimation, is 70 to 75%. The  left ventricle has hyperdynamic function. The left ventricle has no  regional wall motion abnormalities. There is mild left ventricular  hypertrophy. Left ventricular diastolic  parameters are indeterminate.   2. Right ventricular systolic function is normal. The right ventricular  size is normal.   3. Left atrial size was mildly dilated.   4. The mitral valve is normal in structure. No evidence of mitral valve  regurgitation. No evidence of mitral stenosis. Moderate mitral annular  calcification.   5. The aortic valve is calcified. There is moderate calcification of the  aortic valve. There is mild thickening of the aortic valve. Aortic valve  regurgitation is trivial. Mild aortic valve stenosis. Aortic valve area,  by VTI measures 2.34 cm. Aortic  valve mean gradient measures 12.0 mmHg. Aortic valve Vmax measures 2.12  m/s.   6. There is Moderate (Grade III) plaque involving the aortic root.   7. The inferior vena cava is normal in size with greater than 50%  respiratory variability, suggesting right atrial pressure of 3 mmHg.     CT chest revealed: 1. Moderate motion and patient arm position degradation. 2. Given this factor, no significant change  since 11/20/2023. 3. Small loculated right pleural effusion. Right lower lobe patchy airspace disease, primarily felt to represent atelectasis. Cannot exclude concurrent pneumonia. 4. Smooth septal thickening and upper lobe heterogeneous ground-glass, favoring mild pulmonary edema. 5. Cardiomegaly. Aortic atherosclerosis (ICD10-I70.0), coronary artery atherosclerosis and emphysema (ICD10-J43.9).  Antimicrobials:  IV cefepime. IV vancomycin discontinued. Low threshold to de-escalate oral antibiotics.   Subjective: Volume overload is improving. Breathing is improving.  Objective: Vitals:   11/22/23 0328 11/22/23 0400 11/22/23 0801 11/22/23 1041  BP: (!) 118/58  122/62 (!) 117/57  Pulse: 86 86 80 77  Resp:  20 20 20   Temp:   98.5 F (36.9 C) 98.2 F (36.8 C)  TempSrc:   Axillary Axillary  SpO2: 97% 96% 98% 94%  Weight: 92.2 kg     Height:        Intake/Output Summary (Last 24 hours) at 11/22/2023 1605 Last data filed at 11/22/2023 1043 Gross per 24 hour  Intake 340 ml  Output 2550 ml  Net -2210 ml   Filed Weights   11/20/23 2143 11/22/23 0328  Weight: 95.3 kg 92.2 kg    Examination:  General exam: Presently, patient is calm.  JVD is difficult to assess. Respiratory system: Clear to auscultation.  Cardiovascular system: S1 & S2 Gastrointestinal system: Abdomen is obese versus gaseous distention.  Nontender. Central nervous system: Awake and alert.   Extremities: 2-2+ edema of lower extremities.  Data Reviewed: I have personally reviewed following labs and imaging studies  CBC: Recent Labs  Lab 11/20/23 1959 11/20/23 2017 11/20/23 2057 11/21/23 0515 11/22/23 0213  WBC  --   --  11.1* 9.2 8.2  NEUTROABS  --   --  10.0* 7.8* 6.4  HGB 10.9* 11.2* 9.2* 9.3* 8.7*  HCT 32.0* 33.0* 29.3* 29.9* 27.1*  MCV  --   --  77.1* 77.9* 76.3*  PLT  --   --  347 353 309   Basic Metabolic Panel: Recent Labs  Lab 11/20/23 1959 11/20/23 2000 11/20/23 2017  11/21/23 0515 11/22/23 0213  NA 145 144 144 145 147*  K 4.6 4.6 5.0 4.5 4.1  CL 122* 118*  --  119* 121*  CO2  --  15*  --  14* 13*  GLUCOSE 187* 191*  --  140* 182*  BUN 57* 62*  --  61* 62*  CREATININE 2.50* 2.35*  --  2.32* 2.32*  CALCIUM  --  10.9*  --  10.6* 10.2  MG  --   --   --   --  2.0  PHOS  --   --   --   --  2.9   GFR: Estimated Creatinine Clearance: 30.6 mL/min (A) (by C-G formula based on SCr of 2.32 mg/dL (H)). Liver Function Tests: Recent Labs  Lab 11/20/23 2000 11/22/23 0213  AST 18  --   ALT 19  --   ALKPHOS 73  --   BILITOT 0.9  --   PROT 7.6  --   ALBUMIN 3.2* 2.6*   No results for input(s): "LIPASE", "AMYLASE" in the last 168 hours. No results for input(s): "AMMONIA" in the last 168 hours. Coagulation Profile: Recent Labs  Lab 11/20/23 2000  INR 1.2   Cardiac Enzymes: No results for input(s): "CKTOTAL", "CKMB", "CKMBINDEX", "TROPONINI" in the last 168 hours. BNP (last 3 results) No results for input(s): "PROBNP" in the last 8760 hours. HbA1C: No results for input(s): "HGBA1C" in the last 72 hours. CBG: Recent Labs  Lab 11/21/23 1349 11/21/23 1631 11/21/23 2236 11/22/23 0810 11/22/23 1227  GLUCAP 139* 121* 207* 132* 157*   Lipid Profile: No results for input(s): "CHOL", "HDL", "LDLCALC", "TRIG", "CHOLHDL", "LDLDIRECT" in the last 72 hours. Thyroid Function Tests: No results for input(s): "TSH", "T4TOTAL", "FREET4", "T3FREE", "THYROIDAB" in the last 72 hours. Anemia Panel: No results for input(s): "VITAMINB12", "FOLATE", "FERRITIN", "TIBC", "IRON", "RETICCTPCT" in the last 72 hours. Urine analysis:    Component Value Date/Time   COLORURINE YELLOW 11/21/2023 0010   APPEARANCEUR CLEAR 11/21/2023 0010   LABSPEC 1.020 11/21/2023 0010   PHURINE 5.5 11/21/2023 0010   GLUCOSEU 100 (A) 11/21/2023 0010   HGBUR NEGATIVE 11/21/2023 0010   BILIRUBINUR NEGATIVE 11/21/2023 0010   KETONESUR NEGATIVE 11/21/2023 0010   PROTEINUR 30 (A)  11/21/2023 0010   NITRITE NEGATIVE 11/21/2023 0010   LEUKOCYTESUR SMALL (A) 11/21/2023 0010   Sepsis Labs: @LABRCNTIP (procalcitonin:4,lacticidven:4)  ) Recent Results (from the past 240 hours)  Resp panel by RT-PCR (RSV, Flu A&B, Covid) Anterior Nasal Swab     Status: None   Collection Time: 11/20/23  8:14 PM   Specimen: Anterior Nasal Swab  Result Value Ref Range Status   SARS Coronavirus 2 by RT PCR NEGATIVE NEGATIVE Final   Influenza A by PCR NEGATIVE NEGATIVE Final   Influenza B by PCR NEGATIVE NEGATIVE Final    Comment: (NOTE) The Xpert Xpress SARS-CoV-2/FLU/RSV plus assay is intended as an aid in the diagnosis of influenza from Nasopharyngeal swab specimens and should not be used as a sole basis for treatment. Nasal washings and aspirates are unacceptable for Xpert Xpress SARS-CoV-2/FLU/RSV testing.  Fact Sheet for Patients: BloggerCourse.com  Fact Sheet for Healthcare Providers: SeriousBroker.it  This test is not yet approved or cleared by the [place] FDA and has been authorized for detection and/or diagnosis of SARS-CoV-2 by FDA under an Emergency Use Authorization (EUA). This EUA will remain in effect (meaning this test can be used) for the duration of the COVID-19 declaration under  Section 564(b)(1) of the Act, 21 U.S.C. section 360bbb-3(b)(1), unless the authorization is terminated or revoked.     Resp Syncytial Virus by PCR NEGATIVE NEGATIVE Final    Comment: (NOTE) Fact Sheet for Patients: BloggerCourse.com  Fact Sheet for Healthcare Providers: SeriousBroker.it  This test is not yet approved or cleared by the [place] FDA and has been authorized for detection and/or diagnosis of SARS-CoV-2 by FDA under an Emergency Use Authorization (EUA). This EUA will remain in effect (meaning this test can be used) for the duration of the COVID-19 declaration  under Section 564(b)(1) of the Act, 21 U.S.C. section 360bbb-3(b)(1), unless the authorization is terminated or revoked.  Performed at [facility], [address], [place], [place] [address]   Urine Culture     Status: Abnormal (Preliminary result)   Collection Time: 11/21/23 12:10 AM   Specimen: Urine, Random  Result Value Ref Range Status   Specimen Description URINE, RANDOM  Final   Special Requests NONE Reflexed from [id number]  Final   Culture (A)  Final    70,000 COLONIES/mL ENTEROCOCCUS FAECALIS SUSCEPTIBILITIES TO FOLLOW Performed at [facility], [address], [place], [place] [address]    Report Status PENDING  Incomplete  Body fluid culture w Gram Stain     Status: None (Preliminary result)   Collection Time: 11/21/23 11:46 AM   Specimen: Lung, Right; Pleural Fluid  Result Value Ref Range Status   Specimen Description PLEURAL  Final   Special Requests right lung  Final   Gram Stain   Final    WBC PRESENT,BOTH PMN AND MONONUCLEAR NO ORGANISMS SEEN CYTOSPIN SMEAR    Culture   Final    NO GROWTH < 24 HOURS Performed at [facility], [address], [place], [place] [address]    Report Status PENDING  Incomplete  MRSA Next Gen by PCR, Nasal     Status: None   Collection Time: 11/21/23  9:03 PM   Specimen: Nasal Mucosa; Nasal Swab  Result Value Ref Range Status   MRSA by PCR Next Gen NOT DETECTED NOT DETECTED Final    Comment: (NOTE) The GeneXpert MRSA Assay (FDA approved for NASAL specimens only), is one component of a comprehensive MRSA colonization surveillance program. It is not intended to diagnose MRSA infection nor to guide or monitor treatment for MRSA infections. Test performance is not FDA approved in patients less than 35 years old. Performed at [facility], [address] [place], [place] [address]          Radiology Studies: CT CHEST WO CONTRAST Result Date: 11/22/2023 CLINICAL DATA:  Pneumonia suspected. EXAM: CT  CHEST WITHOUT CONTRAST TECHNIQUE: Multidetector CT imaging of the chest was performed following the standard protocol without IV contrast. RADIATION DOSE REDUCTION: This exam was performed according to the departmental dose-optimization program which includes automated exposure control, adjustment of the mA and/or kV according to patient size and/or use of iterative reconstruction technique. COMPARISON:  Chest radiograph of yesterday.  CT of 11/20/2023 FINDINGS: Cardiovascular: Moderate motion degradation. Exam also degraded by arm position, not raised above the head. Aortic atherosclerosis. Tortuous thoracic aorta. Moderate cardiomegaly, without pericardial effusion. Three vessel coronary artery calcification. Mediastinum/Nodes: No mediastinal or hilar adenopathy, given limitations of unenhanced CT. Lungs/Pleura: Similar small right pleural effusion with minimal loculation superior laterally. Moderate right hemidiaphragm elevation. Moderate centrilobular emphysema. Smooth septal thickening. Mosaic attenuation in the upper lobes favored to be related to heterogeneous ground-glass. Patchy right  lower lobe airspace disease. Upper Abdomen: Status post liver transplant. Normal imaged portions of the spleen, pancreas, adrenal glands. Cholecystectomy. Proximal gastric underdistention. Motion degradation continuing into the upper abdomen. Musculoskeletal: Thoracic spondylosis. IMPRESSION: 1. Moderate motion and patient arm position degradation. 2. Given this factor, no significant change since 11/20/2023. 3. Small loculated right pleural effusion. Right lower lobe patchy airspace disease, primarily felt to represent atelectasis. Cannot exclude concurrent pneumonia. 4. Smooth septal thickening and upper lobe heterogeneous ground-glass, favoring mild pulmonary edema. 5. Cardiomegaly. Aortic atherosclerosis (ICD10-I70.0), coronary artery atherosclerosis and emphysema (ICD10-J43.9). Electronically Signed   By: Jeronimo Greaves  M.D.   On: 11/22/2023 11:53   ECHOCARDIOGRAM COMPLETE Result Date: 11/21/2023    ECHOCARDIOGRAM REPORT   Patient Name:   HILERY WINTLE Date of Exam: 11/21/2023 Medical Rec #:  [id number]       Height:       73.0 in Accession #:    [id number]      Weight:       210.0 lb Date of Birth:  27-Jan-1946       BSA:          2.197 m Patient Age:    77 years        BP:           159/70 mmHg Patient Gender: M               HR:           68 bpm. Exam Location:  Inpatient Procedure: 2D Echo, Cardiac Doppler and Color Doppler (Both Spectral and Color            Flow Doppler were utilized during procedure). Indications:    CHF- Acute Diastolic  History:        Patient has prior history of Echocardiogram examinations, most                 recent 11/25/2021. Arrythmias:Atrial Fibrillation; Risk                 Factors:Hypertension and Diabetes.  Sonographer:    Rosaland Lao Referring Phys: Kylie.Dienes DEBBY CROSLEY IMPRESSIONS  1. Left ventricular ejection fraction, by estimation, is 70 to 75%. The left ventricle has hyperdynamic function. The left ventricle has no regional wall motion abnormalities. There is mild left ventricular hypertrophy. Left ventricular diastolic parameters are indeterminate.  2. Right ventricular systolic function is normal. The right ventricular size is normal.  3. Left atrial size was mildly dilated.  4. The mitral valve is normal in structure. No evidence of mitral valve regurgitation. No evidence of mitral stenosis. Moderate mitral annular calcification.  5. The aortic valve is calcified. There is moderate calcification of the aortic valve. There is mild thickening of the aortic valve. Aortic valve regurgitation is trivial. Mild aortic valve stenosis. Aortic valve area, by VTI measures 2.34 cm. Aortic valve mean gradient measures 12.0 mmHg. Aortic valve Vmax measures 2.12 m/s.  6. There is Moderate (Grade III) plaque involving the aortic root.  7. The inferior vena cava is normal in size with greater than  50% respiratory variability, suggesting right atrial pressure of 3 mmHg. FINDINGS  Left Ventricle: Left ventricular ejection fraction, by estimation, is 70 to 75%. The left ventricle has hyperdynamic function. The left ventricle has no regional wall motion abnormalities. The left ventricular internal cavity size was normal in size. There is mild left ventricular hypertrophy. Left ventricular diastolic parameters are indeterminate. Right Ventricle: The right ventricular size is normal. No increase  in right ventricular wall thickness. Right ventricular systolic function is normal. Left Atrium: Left atrial size was mildly dilated. Right Atrium: Right atrial size was normal in size. Pericardium: There is no evidence of pericardial effusion. Mitral Valve: The mitral valve is normal in structure. Moderate mitral annular calcification. No evidence of mitral valve regurgitation. No evidence of mitral valve stenosis. Tricuspid Valve: The tricuspid valve is normal in structure. Tricuspid valve regurgitation is trivial. No evidence of tricuspid stenosis. Aortic Valve: The aortic valve is calcified. There is moderate calcification of the aortic valve. There is mild thickening of the aortic valve. Aortic valve regurgitation is trivial. Mild aortic stenosis is present. Aortic valve mean gradient measures 12.0 mmHg. Aortic valve peak gradient measures 18.0 mmHg. Aortic valve area, by VTI measures 2.34 cm. Pulmonic Valve: The pulmonic valve was normal in structure. Pulmonic valve regurgitation is not visualized. No evidence of pulmonic stenosis. Aorta: The aortic root is normal in size and structure. There is moderate (Grade III) plaque involving the aortic root. Venous: The inferior vena cava is normal in size with greater than 50% respiratory variability, suggesting right atrial pressure of 3 mmHg. IAS/Shunts: No atrial level shunt detected by color flow Doppler.  LEFT VENTRICLE PLAX 2D LVIDd:         4.10 cm LVIDs:         2.70  cm LV PW:         1.30 cm LV IVS:        1.30 cm LVOT diam:     2.20 cm LV SV:         97 LV SV Index:   44 LVOT Area:     3.80 cm  LV Volumes (MOD) LV vol d, MOD A4C: 158.0 ml LV vol s, MOD A4C: 57.2 ml LV SV MOD A4C:     158.0 ml RIGHT VENTRICLE RV S prime:     12.90 cm/s TAPSE (M-mode): 1.4 cm LEFT ATRIUM             Index        RIGHT ATRIUM           Index LA diam:        4.30 cm 1.96 cm/m   RA Area:     16.80 cm LA Vol (A2C):   51.6 ml 23.49 ml/m  RA Volume:   38.30 ml  17.44 ml/m LA Vol (A4C):   58.9 ml 26.81 ml/m LA Biplane Vol: 55.2 ml 25.13 ml/m  AORTIC VALVE AV Area (Vmax):    2.53 cm AV Area (Vmean):   2.34 cm AV Area (VTI):     2.34 cm AV Vmax:           212.00 cm/s AV Vmean:          162.000 cm/s AV VTI:            0.414 m AV Peak Grad:      18.0 mmHg AV Mean Grad:      12.0 mmHg LVOT Vmax:         141.00 cm/s LVOT Vmean:        99.700 cm/s LVOT VTI:          0.255 m LVOT/AV VTI ratio: 0.62  AORTA Ao Root diam: 3.60 cm Ao Asc diam:  3.60 cm  SHUNTS Systemic VTI:  0.26 m Systemic Diam: 2.20 cm Donato Schultz MD Electronically signed by Donato Schultz MD Signature Date/Time: 11/21/2023/12:28:57 PM    Final  US THORACENTESIS ASP PLEURAL SPACE W/IMG GUIDE Result Date: 11/21/2023 INDICATION: 78 year old with history of shortness of breath, right pleural effusion. Thoracentesis requested. EXAM: ULTRASOUND GUIDED RIGHT THORACENTESIS MEDICATIONS: 10 mL 1% lidocaine COMPLICATIONS: None immediate. PROCEDURE: An ultrasound guided thoracentesis was thoroughly discussed with the patient and questions answered. The benefits, risks, alternatives and complications were also discussed. The patient understands and wishes to proceed with the procedure. Written consent was obtained. Ultrasound was performed to localize and mark an adequate pocket of fluid in the right chest. The area was then prepped and draped in the normal sterile fashion. 1% Lidocaine was used for local anesthesia. Under ultrasound guidance a 6  Fr Safe-T-Centesis catheter was introduced. Thoracentesis was performed. The catheter was removed and a dressing applied. FINDINGS: A total of approximately 400 mL of amber fluid was removed. Samples were sent to the laboratory as requested by the clinical team. IMPRESSION: Successful ultrasound guided right thoracentesis yielding 400 mL of pleural fluid. Performed by: Loyce Dys PA-C Electronically Signed   By: Malachy Moan M.D.   On: 11/21/2023 12:10   DG Chest 1 View Result Date: 11/21/2023 CLINICAL DATA:  Right pleural effusion, post thoracentesis. EXAM: CHEST  1 VIEW COMPARISON:  11/20/2023 and CT chest 11/20/2023. FINDINGS: Trachea is midline. Heart size stable. Thoracic aorta is calcified. Lungs are low in volume with an elevated right hemidiaphragm. No pneumothorax status post right thoracentesis. Minimal streaky volume loss in the right perihilar region. Lungs are otherwise clear. No residual pleural effusions. IMPRESSION: 1. No pneumothorax status post right thoracentesis. No appreciable residual right pleural fluid. 2. Low lung volumes with streaky right perihilar atelectasis. Electronically Signed   By: Leanna Battles M.D.   On: 11/21/2023 11:05   CT CHEST ABDOMEN PELVIS WO CONTRAST Result Date: 11/20/2023 CLINICAL DATA:  Rib fracture suspected, traumatic EXAM: CT CHEST, ABDOMEN AND PELVIS WITHOUT CONTRAST TECHNIQUE: Multidetector CT imaging of the chest, abdomen and pelvis was performed following the standard protocol without IV contrast. RADIATION DOSE REDUCTION: This exam was performed according to the departmental dose-optimization program which includes automated exposure control, adjustment of the mA and/or kV according to patient size and/or use of iterative reconstruction technique. COMPARISON:  None Available. FINDINGS: Evaluation is generally limited by lack of intravenous contrast and breath motion artifact, particularly in the lower abdomen, as well as streak artifact from  patient arm positioning CT CHEST FINDINGS Cardiovascular: Aortic atherosclerosis. Cardiomegaly. Left coronary artery calcifications. No pericardial effusion. Mediastinum/Nodes: No enlarged mediastinal, hilar, or axillary lymph nodes. Thyroid gland, trachea, and esophagus demonstrate no significant findings. Lungs/Pleura: Moderate, loculated right pleural effusion with associated atelectasis or consolidation. Musculoskeletal: No chest wall abnormality. No obvious fractures. Assessment for rib fracture is significantly limited by pervasive breath motion artifact throughout the chest. CT ABDOMEN PELVIS FINDINGS Hepatobiliary: No focal liver abnormality is seen. Status post cholecystectomy. No biliary dilatation. Pancreas: Unremarkable. No pancreatic ductal dilatation or surrounding inflammatory changes. Spleen: Normal in size without significant abnormality. Adrenals/Urinary Tract: Adrenal glands are unremarkable. 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 No hydronephrosis (series 5, image 70). Bladder is unremarkable. Stomach/Bowel: Stomach is within normal limits. Appendix appears normal. No evidence of bowel wall thickening, distention, or inflammatory changes. Large stool ball in the rectum. Vascular/Lymphatic: Aortic atherosclerosis. 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 No enlarged abdominal or pelvic lymph nodes. Reproductive: Prostatomegaly. Other: No abdominal wall hernia.  Anasarca.  No ascites. Musculoskeletal: No acute  osseous findings. IMPRESSION: 1. Evaluation is generally limited by lack of intravenous contrast and breath motion artifact, particularly in the lower abdomen, as well as streak artifact from patient arm positioning. 2. No obvious fractures. Assessment for rib fracture is significantly limited by pervasive breath motion artifact throughout the chest. 3. Moderate, loculated  right pleural effusion with associated atelectasis or consolidation. 4. 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 No hydronephrosis. 5. Large stool ball in the rectum. 6. Prostatomegaly. 7. Coronary artery disease. Aortic Atherosclerosis (ICD10-I70.0). Electronically Signed   By: Jearld Lesch M.D.   On: 11/20/2023 20:56   CT Head Wo Contrast Result Date: 11/20/2023 CLINICAL DATA:  Recent fall with headaches and neck pain, initial encounter EXAM: CT HEAD WITHOUT CONTRAST CT CERVICAL SPINE WITHOUT CONTRAST TECHNIQUE: Multidetector CT imaging of the head and cervical spine was performed following the standard protocol without intravenous contrast. Multiplanar CT image reconstructions of the cervical spine were also generated. RADIATION DOSE REDUCTION: This exam was performed according to the departmental dose-optimization program which includes automated exposure control, adjustment of the mA and/or kV according to patient size and/or use of iterative reconstruction technique. COMPARISON:  04/29/2023 FINDINGS: CT HEAD FINDINGS Brain: No evidence of acute infarction, hemorrhage, hydrocephalus, extra-axial collection or mass lesion/mass effect. Mild atrophic and chronic white matter ischemic changes are noted. Vascular: No hyperdense vessel or unexpected calcification. Skull: Normal. Negative for fracture or focal lesion. Sinuses/Orbits: No acute finding. Other: None. CT CERVICAL SPINE FINDINGS Alignment: Within normal limits. Skull base and vertebrae: 7 cervical segments are well visualized. Vertebral body height is well maintained. Multilevel osteophytic and facet hypertrophic changes are noted. No acute fracture or acute facet abnormality is noted. The odontoid is within normal limits. Soft tissues and spinal canal: Surrounding soft tissue structures are within normal limits. Disc levels:  Mild disc bulging is  noted at C3-4 and C5-6. Upper chest: Pleural effusion is noted on the right Other: None IMPRESSION: CT of the head: No acute intracranial abnormality noted. Chronic atrophic and ischemic changes. CT of the cervical spine: Multilevel degenerative change without acute abnormality. Electronically Signed   By: Alcide Clever M.D.   On: 11/20/2023 20:43   CT Cervical Spine Wo Contrast Result Date: 11/20/2023 CLINICAL DATA:  Recent fall with headaches and neck pain, initial encounter EXAM: CT HEAD WITHOUT CONTRAST CT CERVICAL SPINE WITHOUT CONTRAST TECHNIQUE: Multidetector CT imaging of the head and cervical spine was performed following the standard protocol without intravenous contrast. Multiplanar CT image reconstructions of the cervical spine were also generated. RADIATION DOSE REDUCTION: This exam was performed according to the departmental dose-optimization program which includes automated exposure control, adjustment of the mA and/or kV according to patient size and/or use of iterative reconstruction technique. COMPARISON:  04/29/2023 FINDINGS: CT HEAD FINDINGS Brain: No evidence of acute infarction, hemorrhage, hydrocephalus, extra-axial collection or mass lesion/mass effect. Mild atrophic and chronic white matter ischemic changes are noted. Vascular: No hyperdense vessel or unexpected calcification. Skull: Normal. Negative for fracture or focal lesion. Sinuses/Orbits: No acute finding. Other: None. CT CERVICAL SPINE FINDINGS Alignment: Within normal limits. Skull base and vertebrae: 7 cervical segments are well visualized. Vertebral body height is well maintained. Multilevel osteophytic and facet hypertrophic changes are noted. No acute fracture or acute facet abnormality is noted. The odontoid is within normal limits. Soft tissues and spinal canal: Surrounding soft tissue structures are within normal limits. Disc levels:  Mild disc bulging is noted  at C3-4 and C5-6. Upper chest: Pleural effusion is noted on the  right Other: None IMPRESSION: CT of the head: No acute intracranial abnormality noted. Chronic atrophic and ischemic changes. CT of the cervical spine: Multilevel degenerative change without acute abnormality. Electronically Signed   By: Alcide Clever M.D.   On: 11/20/2023 20:43   DG Pelvis Portable Result Date: 11/20/2023 CLINICAL DATA:  Status post fall. EXAM: PORTABLE PELVIS 1-2 VIEWS COMPARISON:  None Available. FINDINGS: There is no evidence of pelvic fracture or diastasis. No pelvic bone lesions are seen. IMPRESSION: Negative. Electronically Signed   By: Aram Candela M.D.   On: 11/20/2023 20:26   DG Chest Port 1 View Result Date: 11/20/2023 CLINICAL DATA:  Status post fall. EXAM: PORTABLE CHEST 1 VIEW COMPARISON:  September 30, 2023 FINDINGS: The heart size and mediastinal contours are within normal limits. There is prominence of the central pulmonary vasculature. Low lung volumes are noted with mild atelectatic changes seen within the bilateral lung bases. There is no evidence of focal consolidation, pleural effusion or pneumothorax. The visualized skeletal structures are unremarkable. IMPRESSION: Low lung volumes with pulmonary vascular congestion and mild bibasilar atelectasis. Electronically Signed   By: Aram Candela M.D.   On: 11/20/2023 20:25   Dr.     Shirline Frees Meds:  budesonide (PULMICORT) nebulizer solution  0.25 mg Nebulization BID   calcitRIOL  0.25 mcg Oral Daily   clopidogrel  75 mg Oral Daily   cycloSPORINE modified  125 mg Oral BID   finasteride  5 mg Oral QPM   heparin  5,000 Units Subcutaneous Q8H   hydrALAZINE  25 mg Oral Q12H   insulin aspart  0-15 Units Subcutaneous TID WC   ipratropium-albuterol  3 mL Nebulization Q8H   levETIRAcetam  500 mg Oral BID   rosuvastatin  5 mg Oral QPM   sodium bicarbonate  50 mEq Intravenous Once   sodium bicarbonate  1,300 mg Oral BID   tamsulosin  0.4 mg Oral Daily   Continuous Infusions:  ceFEPime (MAXIPIME) IV 2 g  (11/22/23 1325)   furosemide 100 mg (11/22/23 0547)     LOS: 1 day    Time spent: 55 minutes.    Berton Mount, MD  [facility] Pager #: [phone] 7PM-7AM contact night coverage as above

## 2023-11-23 ENCOUNTER — Inpatient Hospital Stay (HOSPITAL_COMMUNITY)

## 2023-11-23 ENCOUNTER — Encounter (HOSPITAL_COMMUNITY): Payer: Self-pay | Admitting: Family Medicine

## 2023-11-23 DIAGNOSIS — Z940 Kidney transplant status: Secondary | ICD-10-CM | POA: Diagnosis not present

## 2023-11-23 DIAGNOSIS — I5033 Acute on chronic diastolic (congestive) heart failure: Secondary | ICD-10-CM | POA: Diagnosis not present

## 2023-11-23 DIAGNOSIS — N3000 Acute cystitis without hematuria: Secondary | ICD-10-CM

## 2023-11-23 LAB — RENAL FUNCTION PANEL
Albumin: 2.4 g/dL — ABNORMAL LOW (ref 3.5–5.0)
Albumin: 2.4 g/dL — ABNORMAL LOW (ref 3.5–5.0)
Anion gap: 9 (ref 5–15)
Anion gap: 11 (ref 5–15)
BUN: 67 mg/dL — ABNORMAL HIGH (ref 8–23)
BUN: 73 mg/dL — ABNORMAL HIGH (ref 8–23)
CO2: 17 mmol/L — ABNORMAL LOW (ref 22–32)
CO2: 15 mmol/L — ABNORMAL LOW (ref 22–32)
Calcium: 10.4 mg/dL — ABNORMAL HIGH (ref 8.9–10.3)
Calcium: 9.9 mg/dL (ref 8.9–10.3)
Chloride: 124 mmol/L — ABNORMAL HIGH (ref 98–111)
Chloride: 125 mmol/L — ABNORMAL HIGH (ref 98–111)
Creatinine, Ser: 2.58 mg/dL — ABNORMAL HIGH (ref 0.61–1.24)
Creatinine, Ser: 2.51 mg/dL — ABNORMAL HIGH (ref 0.61–1.24)
GFR, Estimated: 25 mL/min — ABNORMAL LOW (ref >60)
GFR, Estimated: 26 mL/min — ABNORMAL LOW (ref >60)
Glucose, Bld: 174 mg/dL — ABNORMAL HIGH (ref 70–99)
Glucose, Bld: 164 mg/dL — ABNORMAL HIGH (ref 70–99)
Phosphorus: >30 mg/dL — ABNORMAL HIGH (ref 2.5–4.6)
Phosphorus: 3.1 mg/dL (ref 2.5–4.6)
Potassium: 3.9 mmol/L (ref 3.5–5.1)
Potassium: 4.5 mmol/L (ref 3.5–5.1)
Sodium: 150 mmol/L — ABNORMAL HIGH (ref 135–145)
Sodium: 151 mmol/L — ABNORMAL HIGH (ref 135–145)

## 2023-11-23 LAB — CBC WITH DIFFERENTIAL/PLATELET
Abs Immature Granulocytes: 0.08 10*3/uL — ABNORMAL HIGH (ref 0.00–0.07)
Basophils Absolute: 0.1 10*3/uL (ref 0.0–0.1)
Basophils Relative: 1 %
Eosinophils Absolute: 0.4 10*3/uL (ref 0.0–0.5)
Eosinophils Relative: 4 %
HCT: 26.3 % — ABNORMAL LOW (ref 39.0–52.0)
Hemoglobin: 8.5 g/dL — ABNORMAL LOW (ref 13.0–17.0)
Immature Granulocytes: 1 %
Lymphocytes Relative: 8 %
Lymphs Abs: 0.7 10*3/uL (ref 0.7–4.0)
MCH: 24.4 pg — ABNORMAL LOW (ref 26.0–34.0)
MCHC: 32.3 g/dL (ref 30.0–36.0)
MCV: 75.4 fL — ABNORMAL LOW (ref 80.0–100.0)
Monocytes Absolute: 1.1 10*3/uL — ABNORMAL HIGH (ref 0.1–1.0)
Monocytes Relative: 12 %
Neutro Abs: 7 10*3/uL (ref 1.7–7.7)
Neutrophils Relative %: 74 %
Platelets: 289 10*3/uL (ref 150–400)
RBC: 3.49 MIL/uL — ABNORMAL LOW (ref 4.22–5.81)
RDW: 16.6 % — ABNORMAL HIGH (ref 11.5–15.5)
WBC: 9.3 10*3/uL (ref 4.0–10.5)
nRBC: 0 % (ref 0.0–0.2)

## 2023-11-23 LAB — URINE CULTURE: Culture: 70000 — AB

## 2023-11-23 LAB — GLUCOSE, CAPILLARY
Glucose-Capillary: 151 mg/dL — ABNORMAL HIGH (ref 70–99)
Glucose-Capillary: 144 mg/dL — ABNORMAL HIGH (ref 70–99)
Glucose-Capillary: 146 mg/dL — ABNORMAL HIGH (ref 70–99)
Glucose-Capillary: 146 mg/dL — ABNORMAL HIGH (ref 70–99)

## 2023-11-23 LAB — PHOSPHORUS: Phosphorus: 3.2 mg/dL (ref 2.5–4.6)

## 2023-11-23 MED ORDER — FREE WATER: 150.0000 mL | Status: DC

## 2023-11-23 MED ORDER — ENSURE ENLIVE PO LIQD: 237.0000 mL | Freq: Two times a day (BID) | ORAL | Status: DC

## 2023-11-23 MED ORDER — MYCOPHENOLATE SODIUM 180 MG PO TBEC
180.0000 mg | DELAYED_RELEASE_TABLET | Freq: Two times a day (BID) | ORAL | Status: DC
Start: 2023-11-23 — End: 2023-11-23
  Filled 2023-11-23 (×2): qty 1

## 2023-11-23 MED ORDER — FUROSEMIDE 10 MG/ML IJ SOLN: 80.0000 mg | Freq: Every day | INTRAMUSCULAR | Status: DC

## 2023-11-23 MED ORDER — IPRATROPIUM-ALBUTEROL 0.5-2.5 (3) MG/3ML IN SOLN
3.0000 mL | Freq: Two times a day (BID) | RESPIRATORY_TRACT | Status: DC
  Administered 2023-11-23 – 2023-11-29 (×11): 3 mL via RESPIRATORY_TRACT
  Filled 2023-11-23 (×11): qty 3

## 2023-11-23 MED ORDER — DEXTROSE 5 % IV SOLN: INTRAVENOUS | Status: AC

## 2023-11-23 MED ORDER — LEVETIRACETAM IN NACL 500 MG/100ML IV SOLN
500.0000 mg | Freq: Two times a day (BID) | INTRAVENOUS | Status: DC
  Administered 2023-11-24 – 2023-12-02 (×19): 500 mg via INTRAVENOUS
  Filled 2023-11-23 (×20): qty 100

## 2023-11-23 MED ORDER — SODIUM CHLORIDE 0.9 % IV SOLN
3.0000 g | Freq: Two times a day (BID) | INTRAVENOUS | Status: DC
  Administered 2023-11-23 – 2023-11-29 (×13): 3 g via INTRAVENOUS
  Filled 2023-11-23 (×13): qty 8

## 2023-11-23 NOTE — Plan of Care (Signed)
  Problem: Skin Integrity: Goal: Risk for impaired skin integrity will decrease Outcome: Progressing   Problem: Safety: Goal: Ability to remain free from injury will improve Outcome: Progressing   

## 2023-11-23 NOTE — Progress Notes (Signed)
 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  Patient was admitted with hypotension (blood pressure 70/50 mmHg), shortness of breath, lethargy, pulmonary vascular congestion, bibasilar atelectasis, mild worsening of renal function (baseline CKD 3B/3a), elevated cardiac BNP (231), CO2 of 14, and mild leukocytosis (11.1) with left shift.  H&P note documents possible pneumonia with loculated pleural effusion.  Patient underwent ultrasound-guided right thoracentesis on 11/21/2023 that yielded 400 mL of amber versus blood-tinged fluid.  Pleural fluid culture has not grown any organism to date.  Urine culture has grown Enterococcus faecalis.  IV vancomycin has been discontinued.  IV cefepime has been changed to IV Unasyn.  Tmax of 100.8 noted overnight.  Patient was significantly volume overloaded on presentation.  Echocardiogram revealed diastolic dysfunction.  Started on IV Lasix 100 Mg 3 times daily.  Lasix has been decreased to 80 Mg daily from today, due to hyponatremia.  Nephrology team is managing patient's volume overload and diuretics.  Nephrology to advise if to hold Myfortic.   11/23/2023: Patient seen alongside patient's wife.  Patient is more lethargic today.  Sodium is 150.  Diuretics have been decreased significantly.  Discussed gentle hydration with D5W with the nephrology team.  As documented above, vancomycin has been discontinued.  IV cefepime has been changed to IV Unasyn.   Assessment & Plan:   Principal Problem:   Community acquired  pneumonia Active Problems:   Seizure (HCC)   History of renal transplant   Hemiplegia, unspecified affecting unspecified side (HCC)   Essential hypertension   Cerebellar stroke syndrome   Benign prostatic hyperplasia with lower urinary tract symptoms   Dementia (HCC)   Acute on chronic diastolic CHF (congestive heart failure) (HCC)   Suspect acute versus acute on chronic diastolic CHF: Hypoxic respiratory failure, likely acute versus acute on chronic: -IV Lasix has been decreased from 100 Mg every 8 hourly to 80 Mg daily.  Nephrology team is managing.. -Continue nebs Pulmicort. -Continue nebs DuoNeb. 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 -Echo findings as documented below. -CT chest without contrast result as documented below.. -Significantly impaired renal function noted, therefore, we will hold GDMT for now. -AKI with likely component of cardiorenal.   -Continue to monitor renal function and electrolytes. -Strict I's and O's. -Continue to monitor volume status (patient is clinically volume overloaded).  Previously documented community-acquired pneumonia as per H&P: -Procalcitonin was 0.16. -CT chest as documented below. -Likelihood of pneumonia seems low. -Discontinued IV vancomycin. -IV cefepime discontinued today (urine cultures growing Enterococcus faecalis) d -Continue to assess closely.  Acute kidney injury of cadaveric transplanted kidney on chronic kidney disease stage IIIb/IIIa: -Suspect AKI has component of cardiorenal syndrome.  -Baseline serum creatinine of 1.57. -On presentation, serum creatinine was 2.5.   -Last serum creatinine of 2.51.  Minor bump in serum creatinine is likely secondary to IV Lasix. -Continue to monitor renal function and electrolytes. -Avoid nephrotoxins. -Dose all medications assuming estimated GFR of 15 mL/min per 1.73 m. -Nephrology input is appreciated.   -Patient is s/p combined cadaveric kidney and liver  transplantation.  UTI secondary to Enterococcus faecalis: -DC IV cefepime. -Start IV Unasyn. -Tmax of 100.8 F  Acidosis: -  CO2 of 13 on presentation. -Patient was initially on sodium bicarb.  Patient also got 1 amp of sodium bicarb. -CO2 earlier today was 17. -Nephrology team is discontinued sodium bicarb. -Continue to monitor closely.    Status post combined cadaveric kidney and liver transplantation: -[facility]. -Maintained on cyclosporine and Myfortic. -Myfortic and cyclosporine has been resumed.   -Normal AST and ALT. -Local nephrology team has been consulted.   -Monitor renal function and electrolytes. -Monitor LFTs.  Advanced dementia: -No behavioral problems.  Hypotension/hypertension: -Significant hypotension prior to admission reported.  See above documentation. -Blood pressure is currently stable. -Continue to monitor closely.  Acute metabolic encephalopathy: -Sodium of 151 noted. -Patient has been on IV Lasix 100 Mg every 8 hourly. -IV Lasix has been decreased to 80 Mg once daily.  Nephrology team is managing. -Gentle and cautious hydration with IV fluid D5 water at 50 cc/h x 10 hours.  DVT prophylaxis: Subcutaneous heparin. Code Status: Full code.  Discussed with patient's daughter. Family Communication: Patient's daughter. Disposition Plan: Patient remains inpatient.   Consultants:  Nephrology.  Procedures:  Echocardiogram revealed: 1. Left ventricular ejection fraction, by estimation, is 70 to 75%. The  left ventricle has hyperdynamic function. The left ventricle has no  regional wall motion abnormalities. There is mild left ventricular  hypertrophy. Left ventricular diastolic  parameters are indeterminate.   2. Right ventricular systolic function is normal. The right ventricular  size is normal.   3. Left atrial size was mildly dilated.   4. The mitral valve is normal in structure. No evidence of mitral valve   regurgitation. No evidence of mitral stenosis. Moderate mitral annular  calcification.   5. The aortic valve is calcified. There is moderate calcification of the  aortic valve. There is mild thickening of the aortic valve. Aortic valve  regurgitation is trivial. Mild aortic valve stenosis. Aortic valve area,  by VTI measures 2.34 cm. Aortic  valve mean gradient measures 12.0 mmHg. Aortic valve Vmax measures 2.12  m/s.   6. There is Moderate (Grade III) plaque involving the aortic root.   7. The inferior vena cava is normal in size with greater than 50%  respiratory variability, suggesting right atrial pressure of 3 mmHg.     CT chest revealed: 1. Moderate motion and patient arm position degradation. 2. Given this factor, no significant change since 11/20/2023. 3. Small loculated right pleural effusion. Right lower lobe patchy airspace disease, primarily felt to represent atelectasis. Cannot exclude concurrent pneumonia. 4. Smooth septal thickening and upper lobe heterogeneous ground-glass, favoring mild pulmonary edema. 5. Cardiomegaly. Aortic atherosclerosis (ICD10-I70.0), coronary artery atherosclerosis and emphysema (ICD10-J43.9).  Antimicrobials:  Discontinue IV cefepime. Start IV Unasyn.   IV vancomycin discontinued.  Subjective: -Patient is lethargic today. -Tmax of 100.8 F documented.  Objective: Vitals:   11/22/23 2319 11/23/23 0301 11/23/23 0406 11/23/23 0717  BP: (!) 109/54  (!) 104/55 109/60  Pulse: 91  (!) 104 (!) 102  Resp: 20  20 (!) 33  Temp: 99.9 F (37.7 C)  99.7 F (37.6 C) 100 F (37.8 C)  TempSrc: Oral  Axillary Axillary  SpO2: 96% 96% 95% 95%  Weight:      Height:        Intake/Output Summary (Last 24 hours) at 11/23/2023 1017 Last data filed at 11/23/2023 0406 Gross per 24 hour  Intake 474.57 ml  Output 1850 ml  Net -1375.43 ml   Filed Weights   11/20/23 2143 11/22/23 0328  Weight: 95.3  kg 92.2 kg    Examination:  General exam:  Lethargic.   Respiratory system: Clear to auscultation.  Cardiovascular system: S1 & S2 Gastrointestinal system: Abdomen is obese versus gaseous distention.  Nontender. Central nervous system: Awake and alert.   Extremities: Minimal lower extremity edema.  Data Reviewed: I have personally reviewed following labs and imaging studies  CBC: Recent Labs  Lab 11/20/23 2017 11/20/23 2057 11/21/23 0515 11/22/23 0213 11/23/23 0227  WBC  --  11.1* 9.2 8.2 9.3  NEUTROABS  --  10.0* 7.8* 6.4 7.0  HGB 11.2* 9.2* 9.3* 8.7* 8.5*  HCT 33.0* 29.3* 29.9* 27.1* 26.3*  MCV  --  77.1* 77.9* 76.3* 75.4*  PLT  --  347 353 309 289   Basic Metabolic Panel: Recent Labs  Lab 11/20/23 1959 11/20/23 2000 11/20/23 2017 11/21/23 0515 11/22/23 0213 11/23/23 0227  NA 145 144 144 145 147* 150*  K 4.6 4.6 5.0 4.5 4.1 3.9  CL 122* 118*  --  119* 121* 124*  CO2  --  15*  --  14* 13* 17*  GLUCOSE 187* 191*  --  140* 182* 174*  BUN 57* 62*  --  61* 62* 67*  CREATININE 2.50* 2.35*  --  2.32* 2.32* 2.58*  CALCIUM  --  10.9*  --  10.6* 10.2 10.4*  MG  --   --   --   --  2.0  --   PHOS  --   --   --   --  2.9 >30.0*   GFR: Estimated Creatinine Clearance: 27.5 mL/min (A) (by C-G formula based on SCr of 2.58 mg/dL (H)). Liver Function Tests: Recent Labs  Lab 11/20/23 2000 11/22/23 0213 11/23/23 0227  AST 18  --   --   ALT 19  --   --   ALKPHOS 73  --   --   BILITOT 0.9  --   --   PROT 7.6  --   --   ALBUMIN 3.2* 2.6* 2.4*   No results for input(s): "LIPASE", "AMYLASE" in the last 168 hours. No results for input(s): "AMMONIA" in the last 168 hours. Coagulation Profile: Recent Labs  Lab 11/20/23 2000  INR 1.2   Cardiac Enzymes: No results for input(s): "CKTOTAL", "CKMB", "CKMBINDEX", "TROPONINI" in the last 168 hours. BNP (last 3 results) No results for input(s): "PROBNP" in the last 8760 hours. HbA1C: No results for input(s): "HGBA1C" in the last 72 hours. CBG: Recent Labs  Lab  11/22/23 0810 11/22/23 1227 11/22/23 1626 11/22/23 2109 11/23/23 0620  GLUCAP 132* 157* 225* 225* 151*   Lipid Profile: No results for input(s): "CHOL", "HDL", "LDLCALC", "TRIG", "CHOLHDL", "LDLDIRECT" in the last 72 hours. Thyroid Function Tests: No results for input(s): "TSH", "T4TOTAL", "FREET4", "T3FREE", "THYROIDAB" in the last 72 hours. Anemia Panel: No results for input(s): "VITAMINB12", "FOLATE", "FERRITIN", "TIBC", "IRON", "RETICCTPCT" in the last 72 hours. Urine analysis:    Component Value Date/Time   COLORURINE YELLOW 11/21/2023 0010   APPEARANCEUR CLEAR 11/21/2023 0010   LABSPEC 1.020 11/21/2023 0010   PHURINE 5.5 11/21/2023 0010   GLUCOSEU 100 (A) 11/21/2023 0010   HGBUR NEGATIVE 11/21/2023 0010   BILIRUBINUR NEGATIVE 11/21/2023 0010   KETONESUR NEGATIVE 11/21/2023 0010   PROTEINUR 30 (A) 11/21/2023 0010   NITRITE NEGATIVE 11/21/2023 0010   LEUKOCYTESUR SMALL (A) 11/21/2023 0010   Sepsis Labs: @LABRCNTIP (procalcitonin:4,lacticidven:4)  ) Recent Results (from the past 240 hours)  Resp panel by RT-PCR (RSV, Flu A&B, Covid) Anterior Nasal Swab  Status: None   Collection Time: 11/20/23  8:14 PM   Specimen: Anterior Nasal Swab  Result Value Ref Range Status   SARS Coronavirus 2 by RT PCR NEGATIVE NEGATIVE Final   Influenza A by PCR NEGATIVE NEGATIVE Final   Influenza B by PCR NEGATIVE NEGATIVE Final    Comment: (NOTE) The Xpert Xpress SARS-CoV-2/FLU/RSV plus assay is intended as an aid in the diagnosis of influenza from Nasopharyngeal swab specimens and should not be used as a sole basis for treatment. Nasal washings and aspirates are unacceptable for Xpert Xpress SARS-CoV-2/FLU/RSV testing.  Fact Sheet for Patients: BloggerCourse.com  Fact Sheet for Healthcare Providers: SeriousBroker.it  This test is not yet approved or cleared by the [place] FDA and has been authorized for detection and/or  diagnosis of SARS-CoV-2 by FDA under an Emergency Use Authorization (EUA). This EUA will remain in effect (meaning this test can be used) for the duration of the COVID-19 declaration under Section 564(b)(1) of the Act, 21 U.S.C. section 360bbb-3(b)(1), unless the authorization is terminated or revoked.     Resp Syncytial Virus by PCR NEGATIVE NEGATIVE Final    Comment: (NOTE) Fact Sheet for Patients: BloggerCourse.com  Fact Sheet for Healthcare Providers: SeriousBroker.it  This test is not yet approved or cleared by the [place] FDA and has been authorized for detection and/or diagnosis of SARS-CoV-2 by FDA under an Emergency Use Authorization (EUA). This EUA will remain in effect (meaning this test can be used) for the duration of the COVID-19 declaration under Section 564(b)(1) of the Act, 21 U.S.C. section 360bbb-3(b)(1), unless the authorization is terminated or revoked.  Performed at [facility], [address] [place], [place] [address]   Urine Culture     Status: Abnormal   Collection Time: 11/21/23 12:10 AM   Specimen: Urine, Random  Result Value Ref Range Status   Specimen Description URINE, RANDOM  Final   Special Requests   Final    NONE Reflexed from [id number] Performed at [facility], [address], [place], [place] [address]    Culture 70,000 COLONIES/mL ENTEROCOCCUS FAECALIS (A)  Final   Report Status 11/23/2023 FINAL  Final   Organism ID, Bacteria ENTEROCOCCUS FAECALIS (A)  Final      Susceptibility   Enterococcus faecalis - MIC*    AMPICILLIN <=2 SENSITIVE Sensitive     NITROFURANTOIN <=16 SENSITIVE Sensitive     VANCOMYCIN 1 SENSITIVE Sensitive     * 70,000 COLONIES/mL ENTEROCOCCUS FAECALIS  Body fluid culture w Gram Stain     Status: None (Preliminary result)   Collection Time: 11/21/23 11:46 AM   Specimen: Lung, Right; Pleural Fluid  Result Value Ref Range Status   Specimen  Description PLEURAL  Final   Special Requests right lung  Final   Gram Stain   Final    WBC PRESENT,BOTH PMN AND MONONUCLEAR NO ORGANISMS SEEN CYTOSPIN SMEAR    Culture   Final    NO GROWTH < 24 HOURS Performed at [facility], [address], [place], [place] [address]    Report Status PENDING  Incomplete  MRSA Next Gen by PCR, Nasal     Status: None   Collection Time: 11/21/23  9:03 PM   Specimen: Nasal Mucosa; Nasal Swab  Result Value Ref Range Status   MRSA by PCR Next Gen NOT DETECTED NOT DETECTED Final    Comment: (NOTE) The GeneXpert MRSA Assay (FDA approved for NASAL specimens only), is one component of a  comprehensive MRSA colonization surveillance program. It is not intended to diagnose MRSA infection nor to guide or monitor treatment for MRSA infections. Test performance is not FDA approved in patients less than 39 years old. Performed at [facility], [address] [place], [place] [address]          Radiology Studies: CT CHEST WO CONTRAST Result Date: 11/22/2023 CLINICAL DATA:  Pneumonia suspected. EXAM: CT CHEST WITHOUT CONTRAST TECHNIQUE: Multidetector CT imaging of the chest was performed following the standard protocol without IV contrast. RADIATION DOSE REDUCTION: This exam was performed according to the departmental dose-optimization program which includes automated exposure control, adjustment of the mA and/or kV according to patient size and/or use of iterative reconstruction technique. COMPARISON:  Chest radiograph of yesterday.  CT of 11/20/2023 FINDINGS: Cardiovascular: Moderate motion degradation. Exam also degraded by arm position, not raised above the head. Aortic atherosclerosis. Tortuous thoracic aorta. Moderate cardiomegaly, without pericardial effusion. Three vessel coronary artery calcification. Mediastinum/Nodes: No mediastinal or hilar adenopathy, given limitations of unenhanced CT. Lungs/Pleura: Similar small right pleural effusion  with minimal loculation superior laterally. Moderate right hemidiaphragm elevation. Moderate centrilobular emphysema. Smooth septal thickening. Mosaic attenuation in the upper lobes favored to be related to heterogeneous ground-glass. Patchy right lower lobe airspace disease. Upper Abdomen: Status post liver transplant. Normal imaged portions of the spleen, pancreas, adrenal glands. Cholecystectomy. Proximal gastric underdistention. Motion degradation continuing into the upper abdomen. Musculoskeletal: Thoracic spondylosis. IMPRESSION: 1. Moderate motion and patient arm position degradation. 2. Given this factor, no significant change since 11/20/2023. 3. Small loculated right pleural effusion. Right lower lobe patchy airspace disease, primarily felt to represent atelectasis. Cannot exclude concurrent pneumonia. 4. Smooth septal thickening and upper lobe heterogeneous ground-glass, favoring mild pulmonary edema. 5. Cardiomegaly. Aortic atherosclerosis (ICD10-I70.0), coronary artery atherosclerosis and emphysema (ICD10-J43.9). Electronically Signed   By: Jeronimo Greaves M.D.   On: 11/22/2023 11:53   US THORACENTESIS ASP PLEURAL SPACE W/IMG GUIDE Result Date: 11/21/2023 INDICATION: 78 year old with history of shortness of breath, right pleural effusion. Thoracentesis requested. EXAM: ULTRASOUND GUIDED RIGHT THORACENTESIS MEDICATIONS: 10 mL 1% lidocaine COMPLICATIONS: None immediate. PROCEDURE: An ultrasound guided thoracentesis was thoroughly discussed with the patient and questions answered. The benefits, risks, alternatives and complications were also discussed. The patient understands and wishes to proceed with the procedure. Written consent was obtained. Ultrasound was performed to localize and mark an adequate pocket of fluid in the right chest. The area was then prepped and draped in the normal sterile fashion. 1% Lidocaine was used for local anesthesia. Under ultrasound guidance a 6 Fr Safe-T-Centesis catheter  was introduced. Thoracentesis was performed. The catheter was removed and a dressing applied. FINDINGS: A total of approximately 400 mL of amber fluid was removed. Samples were sent to the laboratory as requested by the clinical team. IMPRESSION: Successful ultrasound guided right thoracentesis yielding 400 mL of pleural fluid. Performed by: Loyce Dys PA-C Electronically Signed   By: Malachy Moan M.D.   On: 11/21/2023 12:10   DG Chest 1 View Result Date: 11/21/2023 CLINICAL DATA:  Right pleural effusion, post thoracentesis. EXAM: CHEST  1 VIEW COMPARISON:  11/20/2023 and CT chest 11/20/2023. FINDINGS: Trachea is midline. Heart size stable. Thoracic aorta is calcified. Lungs are low in volume with an elevated right hemidiaphragm. No pneumothorax status post right thoracentesis. Minimal streaky volume loss in the right perihilar region. Lungs are otherwise clear. No residual pleural effusions. IMPRESSION: 1. No pneumothorax status post right thoracentesis. No appreciable residual right pleural  fluid. 2. Low lung volumes with streaky right perihilar atelectasis. Electronically Signed   By: Leanna Battles M.D.   On: 11/21/2023 11:05   Dr.     Shirline Frees Meds:  budesonide (PULMICORT) nebulizer solution  0.25 mg Nebulization BID   calcitRIOL  0.25 mcg Oral Daily   clopidogrel  75 mg Oral Daily   cycloSPORINE modified  125 mg Oral BID   finasteride  5 mg Oral QPM   free water  150 mL Oral Q4H   [START ON 11/24/2023] furosemide  80 mg Intravenous Daily   heparin  5,000 Units Subcutaneous Q8H   insulin aspart  0-15 Units Subcutaneous TID WC   ipratropium-albuterol  3 mL Nebulization Q8H   levETIRAcetam  500 mg Oral BID   rosuvastatin  5 mg Oral QPM   tamsulosin  0.4 mg Oral Daily   Continuous Infusions:  ampicillin-sulbactam (UNASYN) IV       LOS: 2 days    Time spent: 55 minutes.    Berton Mount, MD  [facility] Pager #: [phone] 7PM-7AM contact night coverage  as above

## 2023-11-23 NOTE — Progress Notes (Signed)
 Bladder scanned patient this afternoon after cleaning up patient that is incontinent, volume showed 360 residual but patient urinated afterwards without intervention

## 2023-11-23 NOTE — TOC Initial Note (Signed)
 Transition of Care [facility]) - Initial/Assessment Note    Patient Details  Name: Sena Clouatre MRN: [id number] Date of Birth: 1946-07-25  Transition of Care [facility]) CM/SW Contact:    Harriet Masson, RN Phone Number: 11/23/2023, 3:55 PM  Clinical Narrative:                 Sherron Monday to [name] , daughter, at bedside regarding transition needs.  Patient lives with spouse, daughter and son. Patient has hospital bed, hoyer lift, and wheelchair. Patient goes to [place]. Patient has aide 4 days a week through [place]. [facility] or [facility]. Left VM with [place] , [place] RN [phone]. TOC following.   Expected Discharge Plan: Home/Self Care Barriers to Discharge: Continued Medical Work up   Patient Goals and CMS Choice Patient states their goals for this hospitalization and ongoing recovery are:: family wants him to return home CMS Medicare.gov Compare Post Acute Care list provided to:: Patient Represenative (must comment)        Expected Discharge Plan and Services   Discharge Planning Services: CM Consult   Living arrangements for the past 2 months: Single Family Home                                      Prior Living Arrangements/Services Living arrangements for the past 2 months: Single Family Home Lives with:: Adult Children, Spouse Patient language and need for interpreter reviewed:: Yes Do you feel safe going back to the place where you live?: Yes      Need for Family Participation in Patient Care: Yes (Comment) Care giver support system in place?: Yes (comment) Current home services: DME, Homehealth aide Criminal Activity/Legal Involvement Pertinent to Current Situation/Hospitalization: No - Comment as needed  Activities of Daily Living   ADL Screening (condition at time of admission) Independently performs ADLs?: No Does the patient have a NEW difficulty with bathing/dressing/toileting/self-feeding that is expected to last >3 days?: No (total  care) Does the patient have a NEW difficulty with getting in/out of bed, walking, or climbing stairs that is expected to last >3 days?: No (hoyerlift to chair) Does the patient have a NEW difficulty with communication that is expected to last >3 days?: No Is the patient deaf or have difficulty hearing?: Yes Does the patient have difficulty seeing, even when wearing glasses/contacts?: No Does the patient have difficulty concentrating, remembering, or making decisions?: Yes  Permission Sought/Granted                  Emotional Assessment Appearance:: Appears older than stated age       Alcohol / Substance Use: Not Applicable Psych Involvement: No (comment)  Admission diagnosis:  Metabolic acidosis [E87.20] CAP (community acquired pneumonia) [J18.9] Loculated pleural effusion [J90] Pneumonia of right lower lobe due to infectious organism [J18.9] Patient Active Problem List   Diagnosis Date Noted   CAP (community acquired pneumonia) 11/21/2023   Acute on chronic diastolic CHF (congestive heart failure) (HCC) 11/21/2023   Thrombocytopenia (HCC) 10/04/2023   Rhabdomyolysis 10/04/2023   Community acquired pneumonia 10/04/2023   Influenza A with pneumonia 09/28/2023   Acute encephalopathy 01/01/2022   Complicated UTI (urinary tract infection) 01/01/2022   Acute kidney injury superimposed on chronic kidney disease (HCC) 01/01/2022   History of CVA with residual deficit 01/01/2022   Dementia (HCC) 11/25/2021   Pyuria 11/25/2021   Type 2 diabetes mellitus (HCC) 11/24/2021  Stroke (HCC) 11/24/2021   Vitamin D deficiency 11/24/2021   Immunodeficiency, unspecified (HCC) 11/24/2021   Seizure (HCC) 11/24/2021   Sensorineural hearing loss 11/24/2021   Posttraumatic stress disorder 11/24/2021   Obstructive sleep apnea of adult 11/24/2021   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/24/2021   History of renal transplant  11/24/2021   Liver transplant status (HCC) 11/24/2021   Hemiplegia, unspecified affecting unspecified side (HCC) 11/24/2021   Essential hypertension 11/24/2021   Dysphagia, unspecified 11/24/2021   Chronic hepatitis C (HCC) 11/24/2021   Cirrhosis of liver (HCC) 11/24/2021   Cerebellar stroke syndrome 11/24/2021   Cardiomegaly 11/24/2021   Beta thalassemia trait 11/24/2021   Benign prostatic hyperplasia with lower urinary tract symptoms 11/24/2021   Atrial fibrillation (HCC) 11/24/2021   Liver transplant recipient [facility]) 11/24/2021   Acute CVA (cerebrovascular accident) (HCC) 11/24/2021   Bradycardia 07/04/2021   Anemia 05/21/2009   PCP:  [facility]   [id number] - [place],  - [address] [place] [address] Phone: [phone] Fax: [phone]  [facility] - [place], [place] - [address] [place] [address] Phone: [phone] Fax: [phone]     Social Drivers of Health (SDOH) Social History: SDOH Screenings   Food Insecurity: No Food Insecurity (11/21/2023)  Housing: Low Risk  (11/21/2023)  Transportation Needs: No Transportation Needs (11/21/2023)  Utilities: Not At Risk (11/21/2023)  Social Connections: Unknown (11/23/2023)  Recent Concern: Social Connections - Socially Isolated (11/21/2023)  Tobacco Use: Medium Risk (11/23/2023)   SDOH Interventions:     Readmission Risk Interventions    11/27/2021   10:20 AM  Readmission Risk Prevention Plan  Transportation Screening Complete  PCP or Specialist Appt within 3-5 Days Complete  HRI or Home Care Consult Complete  Social Work Consult for Recovery Care Planning/Counseling Complete  Palliative Care Screening Not Applicable  Medication Review Oceanographer) Complete

## 2023-11-23 NOTE — Progress Notes (Incomplete)
 TRH night cross cover note:   I was notified by RN that the patient does not appear safe to swallow this evening. He is reported to be alert and oriented to self, but not reliably following commands.   Based upon this, I changed his keppra from po to IV, and also extended the duration of his existing order for D5 from a stop time of 2300 on 3/24 to a stop time of 0800 on 3/25.   Newton Pigg, DO Hospitalist

## 2023-11-23 NOTE — Progress Notes (Signed)
 [facility] Progress Note  Subjective:  He had 1.9 liters UOP Over 3/23 charted.  He was initiated on IV lasix for overload with resp distress this admission (has been on 100 mg IV every 8 hours).  His wife is at bedside and I spoke with his daughter via his wife's speakerphone.  He gets iron infusions outpatient and also EPO.  His last iron infusions were on 3/3 and 3/10 and his last EPO was 3/9.  His wife feels that he is more confused than normal.   Review of systems:  Unable to obtain secondary to confusion    Vitals:   11/22/23 1947 11/22/23 2319 11/23/23 0301 11/23/23 0406  BP: (!) 107/95 (!) 109/54  (!) 104/55  Pulse: 86 91  (!) 104  Resp: (!) 24 20  20   Temp: 99.4 F (37.4 C) 99.9 F (37.7 C)  99.7 F (37.6 C)  TempSrc: Axillary Oral  Axillary  SpO2: 98% 96% 96% 95%  Weight:      Height:        Physical Exam:   General elderly male in bed in no acute distress HEENT normocephalic atraumatic extraocular movements intact sclera anicteric Neck supple trachea midline Lungs crackles on auscultation on supp oxygen Heart tachycardic; S1S2  Abdomen soft nontender nondistended Extremities trace to 1+ edema  Psych no anxiety or agitation  Neuro patient does open his eyes to family but not to examiner; does not follow commands this AM RUE AVF without bruit or thrill        Renal-related home meds: Hydralazine 100 bid Myfortic 180 bid Norvasc 5 bid Cyclosporine 125mg  bid Lasix 20 mg every other day Losartan 50mg  hs Others: flomax, statin, keppra, proscar, plavix, PPI   Date                             Creat               eGFR (ml/min) 2022                            1.88- 2.00        34- 37 ml/min Mar- dec 2023             2.80 >> 1.65    23 >> 43 ml/min Aug- sept 2024           2.44                 27 ml/min                     1/27- 10/04/23               2.43 >> 1.57    27 >> 41 ml/min           3/21                             2.50 11/21/23                         2.32                 28     Assessment/ Plan: 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 Creat here 2.5 on admission.  H/o renal transplant 12/04/14. Pt was vol overloaded w/ diffuse UE/ LE edema and increase work of breathing initially.   - continue diuresis - reduce lasix to 80 mg daily for now with intermittent doses as needed.  Got 100 mg IV this AM so next dose is ordered for tomorrow  - continue finasteride - check post-void residual bladder scan and in/out cath if retention    Metabolic acidosis -  stop bicarb for now.  Diuretics as above  H/o liver transplant - at same time as renal transplant in 2014-12-04.   Deceased donor renal transplant 2014-12-04 - with AKI on CKD of allograft as noted.  - Resumed his myfortic and cyclosporine at home doses.     Fluid volume overload - diffuse LE/ UE edema. ECHO done, normal LVEF.  Diuretics as above  AMS - may be secondary to hypernatremia. Reached out to primary team as would consider an alternative to cefepime in elderly altered patient with dementia and renal failure   Dementia - his wife reports a history of dementia.  He does recognize her at baseline and is much more communicative.   CKD stage 3b - Note baseline Cr 1.8 - 2.0  HTN - holding losartan for now w/ AKI.  Amlodipine was discontinued due to hypotension.  Will stop hydralazine   Hypernatremia - free water deficit. Stop bicarb for now.  Adjust diuretics.  Start free water 150 mL every 4 hours.  Note prior to admission as well   H/o CVA - w/ hemiparesis  Disposition - continue inpatient monitoring    Recent Labs  Lab 11/22/23 0213 11/23/23 0227  HGB 8.7* 8.5*  ALBUMIN 2.6* 2.4*  CALCIUM 10.2 10.4*  PHOS 2.9 >30.0*  CREATININE 2.32* 2.58*  K 4.1 3.9   No results for input(s): "IRON", "TIBC", "FERRITIN" in the last 168 hours. Inpatient medications:  budesonide (PULMICORT) nebulizer solution  0.25 mg Nebulization BID    calcitRIOL  0.25 mcg Oral Daily   clopidogrel  75 mg Oral Daily   cycloSPORINE modified  125 mg Oral BID   finasteride  5 mg Oral QPM   heparin  5,000 Units Subcutaneous Q8H   hydrALAZINE  25 mg Oral Q12H   insulin aspart  0-15 Units Subcutaneous TID WC   ipratropium-albuterol  3 mL Nebulization Q8H   levETIRAcetam  500 mg Oral BID   rosuvastatin  5 mg Oral QPM   sodium bicarbonate  1,300 mg Oral BID   tamsulosin  0.4 mg Oral Daily    ceFEPime (MAXIPIME) IV Stopped (11/22/23 12/03/53)   furosemide 100 mg (11/23/23 0610)   acetaminophen **OR** acetaminophen, melatonin, senna-docusate     Estanislado Emms, MD 7:41 AM 11/23/2023

## 2023-11-24 DIAGNOSIS — N401 Enlarged prostate with lower urinary tract symptoms: Secondary | ICD-10-CM

## 2023-11-24 LAB — RENAL FUNCTION PANEL
Albumin: 2.2 g/dL — ABNORMAL LOW (ref 3.5–5.0)
Anion gap: 12 (ref 5–15)
BUN: 67 mg/dL — ABNORMAL HIGH (ref 8–23)
CO2: 16 mmol/L — ABNORMAL LOW (ref 22–32)
Calcium: 10 mg/dL (ref 8.9–10.3)
Chloride: 123 mmol/L — ABNORMAL HIGH (ref 98–111)
Creatinine, Ser: 2.73 mg/dL — ABNORMAL HIGH (ref 0.61–1.24)
GFR, Estimated: 23 mL/min — ABNORMAL LOW (ref >60)
Glucose, Bld: 222 mg/dL — ABNORMAL HIGH (ref 70–99)
Phosphorus: 3 mg/dL (ref 2.5–4.6)
Potassium: 3.8 mmol/L (ref 3.5–5.1)
Sodium: 151 mmol/L — ABNORMAL HIGH (ref 135–145)

## 2023-11-24 LAB — BODY FLUID CULTURE W GRAM STAIN

## 2023-11-24 LAB — BASIC METABOLIC PANEL
Anion gap: 9 (ref 5–15)
BUN: 65 mg/dL — ABNORMAL HIGH (ref 8–23)
CO2: 16 mmol/L — ABNORMAL LOW (ref 22–32)
Calcium: 9.9 mg/dL (ref 8.9–10.3)
Chloride: 128 mmol/L — ABNORMAL HIGH (ref 98–111)
Creatinine, Ser: 2.53 mg/dL — ABNORMAL HIGH (ref 0.61–1.24)
GFR, Estimated: 26 mL/min — ABNORMAL LOW (ref >60)
Glucose, Bld: 194 mg/dL — ABNORMAL HIGH (ref 70–99)
Potassium: 4.3 mmol/L (ref 3.5–5.1)
Sodium: 153 mmol/L — ABNORMAL HIGH (ref 135–145)

## 2023-11-24 LAB — GLUCOSE, CAPILLARY
Glucose-Capillary: 148 mg/dL — ABNORMAL HIGH (ref 70–99)
Glucose-Capillary: 146 mg/dL — ABNORMAL HIGH (ref 70–99)
Glucose-Capillary: 153 mg/dL — ABNORMAL HIGH (ref 70–99)
Glucose-Capillary: 167 mg/dL — ABNORMAL HIGH (ref 70–99)

## 2023-11-24 LAB — CYTOLOGY - NON PAP

## 2023-11-24 MED ORDER — DARBEPOETIN ALFA 60 MCG/0.3ML IJ SOSY
60.0000 ug | PREFILLED_SYRINGE | Freq: Once | INTRAMUSCULAR | Status: AC
  Administered 2023-11-24: 60 ug via SUBCUTANEOUS
  Filled 2023-11-24: qty 0.3

## 2023-11-24 NOTE — Progress Notes (Signed)
 SLP Cancellation Note  Patient Details Name: Gilbert Wilson MRN: [id number] DOB: 07-04-1946   Cancelled treatment:       Reason Eval/Treat Not Completed: Patient's level of consciousness Per spouse who was in the room, patient has been more lethargic but had an antibiotic change and has started to improve. He has only consumed a 4 ounce cup of thickened juice so far today. SLP will plan to f/u next date schedule permitting.  Angela Nevin, MA, CCC-SLP Speech Therapy

## 2023-11-24 NOTE — Progress Notes (Signed)
 [facility] Progress Note  Subjective:  He had 1.7 liters UOP Over 3/24 charted.  He wasn't felt safe to swallow so we gave D5W infusion for hypernatremia.  Paused myfortic given the UTI.  Spoke with his daughter at bedside.  They typically in/out cath him twice a day and have done so for the past two weeks.  They had noticed that his underwear was dry.  He had a foley for about a year and urology removed this over a year ago as he was urinating.  He is normally more communicative.  May not always speak but will nod appropriately.  His daughter is worried about him getting his pills.    Review of systems:    Unable to obtain secondary to confusion    Vitals:   11/23/23 1946 11/23/23 2026 11/23/23 2329 11/24/23 0400  BP: (!) 120/54  107/62 (!) 114/57  Pulse: 81  98 79  Resp: 20  20 (!) 28  Temp: 99.6 F (37.6 C)  98.7 F (37.1 C) (!) 100.6 F (38.1 C)  TempSrc: Axillary  Axillary Axillary  SpO2: 98% 97% 97% 96%  Weight:      Height:        Physical Exam:    General elderly male in bed in no acute distress HEENT normocephalic atraumatic extraocular movements intact sclera anicteric Neck supple trachea midline Lungs crackles on auscultation on supp oxygen Heart S1S2 no rub Abdomen soft nontender nondistended Extremities no edema  Psych no anxiety or agitation  Neuro patient does open his eyes; nods once.  Does not follow commands  RUE AVF without bruit or thrill        Renal-related home meds: Hydralazine 100 bid Myfortic 180 bid Norvasc 5 bid Cyclosporine 125mg  bid Lasix 20 mg every other day Losartan 50mg  hs Others: flomax, statin, keppra, proscar, plavix, PPI   Date                             Creat               eGFR (ml/min) 2022                            1.88- 2.00        34- 37 ml/min Mar- dec 2023             2.80 >> 1.65    23 >> 43 ml/min Aug- sept 2024           2.44                 27 ml/min                     1/27- 10/04/23                2.43 >> 1.57    27 >> 41 ml/min           3/21                             2.50 11/21/23                        2.32                 28     Assessment/ Plan: AKI on  CKD 3b transplant - b/l 1.8- 2.0, from 3 yrs of labs, egFR 34- 37 ml/min. Creat here 2.5 on admission.  H/o renal transplant December 08, 2014. Pt was vol overloaded w/ diffuse UE/ LE edema and increase work of breathing initially.   - lasix intermittently as needed - defer today - continue finasteride - place foley catheter for hx of urinary retention - increase D5W to 75 ml/hr x 24 hours and may need to switch to 1/2 NS as below if hyperglycemia worsens  Metabolic acidosis -  off of bicarb for now given hypernatremia.  Diuretics as above  H/o liver transplant - at same time as renal transplant in 2014-12-08.   Deceased donor renal transplant 08-Dec-2014 - with AKI on CKD of allograft as noted.  - Resumed his cyclosporine at home doses.  I have reached out to pharmacy to see if we can get this IV   - he has a UTI so we are currently holding myfortic and will resume once antibiotics are complete   Fluid volume overload - diffuse LE/ UE edema. ECHO done, normal LVEF.  Diuretics as above  AMS - may be secondary to hypernatremia or cefepime.  His cefepime was discontinued per primary team   Dementia - his wife reports a history of dementia.  He does recognize her at baseline and is much more communicative.   CKD stage 3b - Note baseline Cr 1.8 - 2.0  HTN - holding losartan for now w/ AKI.  Amlodipine and hydralazine were discontinued due to hypotension   Hypernatremia - free water deficit. off bicarb for now.  We have adjusted diuretics.  For now continue with D5W.  If sugars uncontrolled may need to switch to 1/2 NS as 1/4 NS not available.  Start free water 150 mL every 4 hours when able to take PO or when has enteric access.  Noted prior to admission as well   Anemia of CKD - on ESA and iron outpatient.  His last iron infusions were on 3/3 and 3/10  and his last EPO was 3/9. Will give aranesp 60 mcg once today  H/o CVA - w/ hemiparesis   Disposition - continue inpatient monitoring    Recent Labs  Lab 11/22/23 0213 11/23/23 0227 11/23/23 1128 11/23/23 1129 11/24/23 0229  HGB 8.7* 8.5*  --   --   --   ALBUMIN 2.6* 2.4* 2.4*  --  2.2*  CALCIUM 10.2 10.4* 9.9  --  10.0  PHOS 2.9 >30.0* 3.1 3.2 3.0  CREATININE 2.32* 2.58* 2.51*  --  2.73*  K 4.1 3.9 4.5  --  3.8   No results for input(s): "IRON", "TIBC", "FERRITIN" in the last 168 hours. Inpatient medications:  budesonide (PULMICORT) nebulizer solution  0.25 mg Nebulization BID   calcitRIOL  0.25 mcg Oral Daily   clopidogrel  75 mg Oral Daily   cycloSPORINE modified  125 mg Oral BID   feeding supplement  237 mL Oral BID BM   finasteride  5 mg Oral QPM   free water  150 mL Oral Q4H   heparin  5,000 Units Subcutaneous Q8H   insulin aspart  0-15 Units Subcutaneous TID WC   ipratropium-albuterol  3 mL Nebulization BID   rosuvastatin  5 mg Oral QPM   tamsulosin  0.4 mg Oral Daily    ampicillin-sulbactam (UNASYN) IV 3 g (11/23/23 2331)   dextrose 50 mL/hr at 11/23/23 1343   levETIRAcetam 500 mg (11/24/23 0023)   acetaminophen **OR** acetaminophen, melatonin, senna-docusate  Estanislado Emms, MD 6:50 AM 11/24/2023

## 2023-11-24 NOTE — Progress Notes (Signed)
 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  Patient was admitted with hypotension (blood pressure 70/50 mmHg), shortness of breath, lethargy, pulmonary vascular congestion, bibasilar atelectasis, mild worsening of renal function (baseline CKD 3B/3a), elevated cardiac BNP (231), CO2 of 14, and mild leukocytosis (11.1) with left shift.  H&P note documents possible pneumonia with loculated pleural effusion.  Patient underwent ultrasound-guided right thoracentesis on 11/21/2023 that yielded 400 mL of amber versus blood-tinged fluid.  Pleural fluid culture has not grown any organism to date.  Urine culture has grown Enterococcus faecalis.  IV vancomycin has been discontinued.  IV cefepime has been changed to IV Unasyn.  Tmax of 100.8 noted overnight.  Patient was significantly volume overloaded on presentation.  Echocardiogram revealed diastolic dysfunction.  Started on IV Lasix 100 Mg 3 times daily.  Lasix has been decreased to 80 Mg daily from today, due to hyponatremia.  Nephrology team is managing patient's volume overload and diuretics.  Nephrology to advise if to hold Myfortic.     11/23/2023: Patient seen alongside patient's wife.  Patient is more lethargic today.  Sodium is 150.  Diuretics have been decreased significantly.  Discussed gentle hydration with D5W with the nephrology team.  As documented above, vancomycin has been discontinued.  IV cefepime has been changed to IV Unasyn.  Assessment and Plan: Acute on chronic hypoxic resp failure  due to acute on chronic diastolic CHF - Lasix per nephro discretion  - Crestor 5 mg PO at bedtime  - Plavix 75 mg PO daily   CAP  - Pulmicort bid - Duoneb bid   AKI of cadaveric transplanted kidney 3b & liver transplant  - IV D5W 75 cc/hr  - Calcitriol 0.25 mcg PO daily  - Cyclosporine 125 mg PO bid   UTI due to e.faecalis  - IV unasyn 3g q12 - Finasteride 5 mg PO at bedtime   - Flomax 0.4 mg PO daily   Metabolic acidosis  - Nephro following  - Lasix PRN  Acute metabolic encephalopathy  - Complicating care   7. Seizure  - IV keppra 500 mg bid   Subjective: Pt seen and examined at the bedside. His mentation is still in question. Family has the cortrak on their radar but has not made a decision about that as of yet. Lasix on hold today per nephrology.  Physical Exam: Vitals:   11/24/23 0400 11/24/23 0739 11/24/23 0848 11/24/23 1219  BP: (!) 114/57 110/60  (!) 122/57  Pulse: 79   75  Resp: (!) 28   20  Temp: (!) 100.6 F (38.1 C) (!) 100.9 F (38.3 C)  99.3 F (37.4 C)  TempSrc: Axillary Axillary  Axillary  SpO2: 96%  100% 93%  Weight:      Height:       Physical Exam Constitutional:      Comments: Sleeping  HENT:     Head: Normocephalic.  Cardiovascular:     Rate and Rhythm: Normal rate.  Pulmonary:     Effort: Pulmonary effort is normal.  Abdominal:     Palpations: Abdomen is soft.  Musculoskeletal:  Cervical back: Neck supple.  Skin:    General: Skin is warm.  Neurological:     Comments: Unable to eval  Psychiatric:     Comments: Unable to eval         Disposition: Status is: Inpatient Remains inpatient appropriate because: Serial labs and IV fluids   Planned Discharge Destination:  Dispo per pt's clinical course     Time spent: 35 minutes  Author: Baron Hamper , MD 11/24/2023 2:17 PM  For on call review [email or web address].

## 2023-11-25 DIAGNOSIS — J189 Pneumonia, unspecified organism: Secondary | ICD-10-CM | POA: Diagnosis not present

## 2023-11-25 LAB — GLUCOSE, CAPILLARY
Glucose-Capillary: 168 mg/dL — ABNORMAL HIGH (ref 70–99)
Glucose-Capillary: 124 mg/dL — ABNORMAL HIGH (ref 70–99)
Glucose-Capillary: 145 mg/dL — ABNORMAL HIGH (ref 70–99)
Glucose-Capillary: 123 mg/dL — ABNORMAL HIGH (ref 70–99)

## 2023-11-25 LAB — BASIC METABOLIC PANEL
Anion gap: 10 (ref 5–15)
Anion gap: 7 (ref 5–15)
BUN: 67 mg/dL — ABNORMAL HIGH (ref 8–23)
BUN: 61 mg/dL — ABNORMAL HIGH (ref 8–23)
CO2: 17 mmol/L — ABNORMAL LOW (ref 22–32)
CO2: 20 mmol/L — ABNORMAL LOW (ref 22–32)
Calcium: 10.1 mg/dL (ref 8.9–10.3)
Calcium: 10.2 mg/dL (ref 8.9–10.3)
Chloride: 128 mmol/L — ABNORMAL HIGH (ref 98–111)
Chloride: 129 mmol/L — ABNORMAL HIGH (ref 98–111)
Creatinine, Ser: 2.52 mg/dL — ABNORMAL HIGH (ref 0.61–1.24)
Creatinine, Ser: 2.51 mg/dL — ABNORMAL HIGH (ref 0.61–1.24)
GFR, Estimated: 26 mL/min — ABNORMAL LOW (ref >60)
GFR, Estimated: 26 mL/min — ABNORMAL LOW (ref >60)
Glucose, Bld: 242 mg/dL — ABNORMAL HIGH (ref 70–99)
Glucose, Bld: 156 mg/dL — ABNORMAL HIGH (ref 70–99)
Potassium: 3.8 mmol/L (ref 3.5–5.1)
Potassium: 3.9 mmol/L (ref 3.5–5.1)
Sodium: 155 mmol/L — ABNORMAL HIGH (ref 135–145)
Sodium: 156 mmol/L — ABNORMAL HIGH (ref 135–145)

## 2023-11-25 LAB — C-REACTIVE PROTEIN: CRP: 15.5 mg/dL — ABNORMAL HIGH (ref <1.0)

## 2023-11-25 MED ORDER — DARBEPOETIN ALFA 60 MCG/0.3ML IJ SOSY
60.0000 ug | PREFILLED_SYRINGE | INTRAMUSCULAR | Status: DC
Start: 2023-12-01 — End: 2023-12-03
  Administered 2023-12-01: 60 ug via SUBCUTANEOUS
  Filled 2023-11-25: qty 0.3

## 2023-11-25 MED ORDER — NYSTATIN 100000 UNIT/ML MT SUSP
5.0000 mL | Freq: Four times a day (QID) | OROMUCOSAL | Status: AC
  Administered 2023-11-25 – 2023-12-02 (×27): 500000 [IU] via ORAL
  Filled 2023-11-25 (×28): qty 5

## 2023-11-25 MED ORDER — CHLORHEXIDINE GLUCONATE CLOTH 2 % EX PADS
6.0000 | MEDICATED_PAD | Freq: Every day | CUTANEOUS | Status: DC
  Administered 2023-11-25 – 2023-12-03 (×9): 6 via TOPICAL

## 2023-11-25 MED ORDER — ENSURE ENLIVE PO LIQD
237.0000 mL | Freq: Two times a day (BID) | ORAL | Status: DC
  Administered 2023-11-28: 237 mL

## 2023-11-25 MED ORDER — FREE WATER
200.0000 mL | Status: DC
  Administered 2023-11-26 – 2023-11-27 (×6): 200 mL

## 2023-11-25 MED ORDER — CYCLOSPORINE MODIFIED (NEORAL) 100 MG/ML PO SOLN
125.0000 mg | Freq: Two times a day (BID) | ORAL | Status: DC
  Administered 2023-11-26 – 2023-12-03 (×15): 125 mg
  Filled 2023-11-25 (×18): qty 1.3

## 2023-11-25 MED ORDER — CLOPIDOGREL BISULFATE 75 MG PO TABS
75.0000 mg | ORAL_TABLET | Freq: Every day | ORAL | Status: DC
  Administered 2023-11-26 – 2023-12-01 (×6): 75 mg
  Filled 2023-11-25 (×6): qty 1

## 2023-11-25 MED ORDER — RENA-VITE PO TABS
1.0000 | ORAL_TABLET | Freq: Every day | ORAL | Status: DC
  Administered 2023-11-26 – 2023-12-02 (×7): 1
  Filled 2023-11-25 (×7): qty 1

## 2023-11-25 MED ORDER — ROSUVASTATIN CALCIUM 5 MG PO TABS
5.0000 mg | ORAL_TABLET | Freq: Every evening | ORAL | Status: DC
  Administered 2023-11-26 – 2023-12-02 (×7): 5 mg
  Filled 2023-11-25 (×7): qty 1

## 2023-11-25 MED ORDER — CALCITRIOL 1 MCG/ML PO SOLN
0.2500 ug | Freq: Every day | ORAL | Status: DC
  Filled 2023-11-25: qty 0.25

## 2023-11-25 NOTE — Plan of Care (Signed)
  Problem: Fluid Volume: Goal: Ability to maintain a balanced intake and output will improve Outcome: Progressing   Problem: Metabolic: Goal: Ability to maintain appropriate glucose levels will improve Outcome: Progressing   Problem: Nutritional: Goal: Maintenance of adequate nutrition will improve Outcome: Progressing Goal: Progress toward achieving an optimal weight will improve Outcome: Progressing   Problem: Skin Integrity: Goal: Risk for impaired skin integrity will decrease Outcome: Progressing   Problem: Tissue Perfusion: Goal: Adequacy of tissue perfusion will improve Outcome: Progressing   Problem: Clinical Measurements: Goal: Ability to maintain clinical measurements within normal limits will improve Outcome: Progressing Goal: Will remain free from infection Outcome: Progressing Goal: Diagnostic test results will improve Outcome: Progressing Goal: Respiratory complications will improve Outcome: Progressing Goal: Cardiovascular complication will be avoided Outcome: Progressing   Problem: Activity: Goal: Risk for activity intolerance will decrease Outcome: Progressing   Problem: Elimination: Goal: Will not experience complications related to bowel motility Outcome: Progressing Goal: Will not experience complications related to urinary retention Outcome: Progressing

## 2023-11-25 NOTE — TOC Progression Note (Signed)
 Transition of Care [facility]) - Progression Note    Patient Details  Name: Gilbert Wilson MRN: [id number] Date of Birth: 1945/12/16  Transition of [facility]) CM/SW Contact  Harriet Masson, RN Phone Number: 11/25/2023, 12:36 PM  Clinical Narrative:    Patient scheduled for cortrak today to receive medications per tube.  TOC following.    Expected Discharge Plan: Home/Self Care Barriers to Discharge: Continued Medical Work up  Expected Discharge Plan and Services   Discharge Planning Services: CM Consult   Living arrangements for the past 2 months: Single Family Home                                       Social Determinants of Health (SDOH) Interventions SDOH Screenings   Food Insecurity: No Food Insecurity (11/21/2023)  Housing: Low Risk  (11/21/2023)  Transportation Needs: No Transportation Needs (11/21/2023)  Utilities: Not At Risk (11/21/2023)  Social Connections: Unknown (11/23/2023)  Recent Concern: Social Connections - Socially Isolated (11/21/2023)  Tobacco Use: Medium Risk (11/23/2023)    Readmission Risk Interventions    11/27/2021   10:20 AM  Readmission Risk Prevention Plan  Transportation Screening Complete  PCP or Specialist Appt within 3-5 Days Complete  HRI or Home Care Consult Complete  Social Work Consult for Recovery Care Planning/Counseling Complete  Palliative Care Screening Not Applicable  Medication Review Oceanographer) Complete

## 2023-11-25 NOTE — Plan of Care (Signed)

## 2023-11-25 NOTE — Progress Notes (Signed)
 [facility] Progress Note  Subjective:  He had 1.4 liters UOP Over 3/25.  He wasn't felt safe to swallow and family had initially wanted to think about even short term enteric access.  He has been on D5W at 75 ml/hr - this just stopped about an hour ago.  He did get cyclosporine oral dose yesterday evening. His wife agrees to enteric access for free water per tube today.  All questions answered   Review of systems:   Unable to obtain secondary to confusion    Vitals:   11/24/23 2008 11/24/23 2206 11/24/23 2329 11/25/23 0438  BP: 122/81  (!) 143/58 (!) 122/58  Pulse: 79  84 78  Resp: 20  20 19   Temp: 100.2 F (37.9 C)  99.4 F (37.4 C) 100.2 F (37.9 C)  TempSrc: Oral  Oral Axillary  SpO2: 97% 97% 93% 95%  Weight:      Height:        Physical Exam:    General elderly male in bed in no acute distress HEENT normocephalic atraumatic extraocular movements intact sclera anicteric Neck supple trachea midline Lungs clear anteriorly on auscultation on supp oxygen; transmitted upper airway sounds Heart S1S2 no rub Abdomen soft nontender nondistended Extremities no edema  Psych no anxiety or agitation  Neuro patient does open his eyes; nods once.  Does not follow commands  GU foley catheter in place  RUE AVF without bruit or thrill        Renal-related home meds: Hydralazine 100 bid Myfortic 180 bid Norvasc 5 bid Cyclosporine 125mg  bid Lasix 20 mg every other day Losartan 50mg  hs Others: flomax, statin, keppra, proscar, plavix, PPI   Date                             Creat               eGFR (ml/min) 2022                            1.88- 2.00        34- 37 ml/min Mar- dec 2023             2.80 >> 1.65    23 >> 43 ml/min Aug- sept 2024           2.44                 27 ml/min                     1/27- 10/04/23               2.43 >> 1.57    27 >> 41 ml/min           3/21                             2.50 11/21/23                        2.32                 28      Assessment/ Plan: 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 Creat here 2.5 on admission.  H/o renal transplant 2016. Pt was vol overloaded w/ diffuse  UE/ LE edema and increase work of breathing initially.   - lasix intermittently as needed - defer today  - continue finasteride - foley catheter was ordered for hx of urinary retention requiring BID in/out cath at home for the 3 weeks preceding admission  - D5W - increase to 125 ml/hr  Metabolic acidosis -  off of bicarb for now given hypernatremia.  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   Deceased donor renal transplant 12-29-2014 - with AKI on CKD of allograft as noted.  - Resumed his cyclosporine at home doses.  I have reached out to pharmacy to see if we can get this IV   - he has a UTI so we are currently holding myfortic and will resume once antibiotics are complete   Fluid volume overload - diffuse LE/ UE edema. Now improved. ECHO done, normal LVEF.  Diuretics as above  AMS - felt secondary to hypernatremia or cefepime.  His cefepime was discontinued per primary team.  Note that this hypernatremia will likely not get better without enteric access for increased free water   Dementia - his wife reports a history of dementia.  He does recognize her at baseline and is much more communicative.  Goals of care discussions per primary team.  At this time she does want to treat treatable conditions such as the above  CKD stage 3b - Note baseline Cr 1.8 - 2.0  HTN - holding losartan for now w/ AKI.  Amlodipine and hydralazine were discontinued due to hypotension   Hypernatremia - free water deficit.  off bicarb for now.   We have adjusted diuretics.   BID BMP for now For now continue with D5W and increase to 125 ml/hr.  Optimize DM. If sugars uncontrolled may need to switch to 1/2 NS as 1/4 NS not available.   Start free water 150 mL every 4 hours.  Noted prior  to admission as well and not surprising with hx dementia.  His hypernatremia will likely not improve without enteric access to free water.  Discussed with his wife.     Anemia of CKD - on ESA and iron outpatient.  His last iron infusions were on 3/3 and 3/10 and his last EPO was 3/9.  CBC in AM S/p aranesp 60 mcg once on 3/25 and I have ordered aranesp 60 mcg weekly on Tuesdays   H/o CVA - w/ hemiparesis   Disposition - continue inpatient monitoring    Recent Labs  Lab 11/22/23 0213 11/23/23 0227 11/23/23 1128 11/23/23 1129 11/24/23 0229 11/24/23 December 29, 2027 11/25/23 0226  HGB 8.7* 8.5*  --   --   --   --   --   ALBUMIN 2.6* 2.4* 2.4*  --  2.2*  --   --   CALCIUM 10.2 10.4* 9.9  --  10.0 9.9 10.1  PHOS 2.9 >30.0* 3.1 3.2 3.0  --   --   CREATININE 2.32* 2.58* 2.51*  --  2.73* 2.53* 2.52*  K 4.1 3.9 4.5  --  3.8 4.3 3.8   No results for input(s): "IRON", "TIBC", "FERRITIN" in the last 168 hours. Inpatient medications:  budesonide (PULMICORT) nebulizer solution  0.25 mg Nebulization BID   calcitRIOL  0.25 mcg Oral Daily   clopidogrel  75 mg Oral Daily   cycloSPORINE modified  125 mg Oral BID   feeding supplement  237 mL Oral BID BM   finasteride  5 mg Oral QPM  heparin  5,000 Units Subcutaneous Q8H   insulin aspart  0-15 Units Subcutaneous TID WC   ipratropium-albuterol  3 mL Nebulization BID   rosuvastatin  5 mg Oral QPM   tamsulosin  0.4 mg Oral Daily    ampicillin-sulbactam (UNASYN) IV 3 g (11/24/23 2252)   dextrose 75 mL/hr at 11/24/23 2337   levETIRAcetam 500 mg (11/24/23 2226)   acetaminophen **OR** acetaminophen, melatonin, senna-docusate     Estanislado Emms, MD 7:02 AM 11/25/2023

## 2023-11-25 NOTE — Plan of Care (Signed)
  Problem: Activity: Goal: Risk for activity intolerance will decrease Outcome: Progressing   Problem: Coping: Goal: Level of anxiety will decrease Outcome: Progressing   Problem: Pain Managment: Goal: General experience of comfort will improve and/or be controlled Outcome: Progressing   Problem: Safety: Goal: Ability to remain free from injury will improve Outcome: Progressing

## 2023-11-25 NOTE — Progress Notes (Signed)
 Cortrak Tube Team Note:  Consult received to place a Cortrak feeding tube. Two Cortrak team members attempted to place tube but unable to advance distal to GE junction after multiple attempts. RN and unit RD notified. Recommend placement by radiology under fluoroscopy. Cortrak tube left in room.   Mertie Clause, MS, RD, LDN Registered Dietitian II Please see AMiON for contact information.

## 2023-11-25 NOTE — Progress Notes (Signed)
 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  Patient was admitted with hypotension (blood pressure 70/50 mmHg), shortness of breath, lethargy, pulmonary vascular congestion, bibasilar atelectasis, mild worsening of renal function (baseline CKD 3B/3a), elevated cardiac BNP (231), CO2 of 14, and mild leukocytosis (11.1) with left shift.  H&P note documents possible pneumonia with loculated pleural effusion.  Patient underwent ultrasound-guided right thoracentesis on 11/21/2023 that yielded 400 mL of amber versus blood-tinged fluid.  Pleural fluid culture has not grown any organism to date.  Urine culture has grown Enterococcus faecalis.  IV vancomycin has been discontinued.  IV cefepime has been changed to IV Unasyn.  Tmax of 100.8 noted overnight.  Patient was significantly volume overloaded on presentation.  Echocardiogram revealed diastolic dysfunction.  Started on IV Lasix 100 Mg 3 times daily.  Lasix has been decreased to 80 Mg daily from today, due to hyponatremia.  Nephrology team is managing patient's volume overload and diuretics.  Nephrology to advise if to hold Myfortic.     11/23/2023: Patient seen alongside patient's wife.  Patient is more lethargic today.  Sodium is 150.  Diuretics have been decreased significantly.  Discussed gentle hydration with D5W with the nephrology team.  As documented above, vancomycin has been discontinued.  IV cefepime has been changed to IV Unasyn.  Assessment and Plan: Acute on chronic hypoxic resp failure  due to acute on chronic diastolic CHF - Lasix per nephro discretion  - Crestor 5 mg PO at bedtime  - Plavix 75 mg PO daily    CAP  - Pulmicort bid - Duoneb bid    AKI of cadaveric transplanted kidney 3b & liver transplant  - IV D5W 125 cc/hr  - Calcitriol 0.25 mcg PO daily  - Cyclosporine 125 mg bid    UTI due to e.faecalis  - IV unasyn 3g q12 - Finasteride 5 mg PO at bedtime   - Flomax 0.4 mg PO daily    Metabolic acidosis  - Nephro following  - Lasix PRN   Acute metabolic encephalopathy  - Complicating care    7. Seizure  - IV keppra 500 mg bid   Subjective: Pt seen and examined at the bedside. Cortrak unable to be placed today. IR consulted for assistance. IV fluids increased by nephro. Fever curve improved. Continue IV antibx.  Physical Exam: Vitals:   11/25/23 0438 11/25/23 0707 11/25/23 0747 11/25/23 0749  BP: (!) 122/58 (!) 124/56    Pulse: 78 77    Resp: 19 (!) 26    Temp: 100.2 F (37.9 C) 100.2 F (37.9 C)    TempSrc: Axillary Axillary    SpO2: 95% 95% 96% 96%  Weight:      Height:       Constitutional:      Comments: Sleeping  HENT:     Head: Normocephalic.  Cardiovascular:     Rate and Rhythm: Normal rate.  Pulmonary:     Effort: Pulmonary effort is normal.  Abdominal:     Palpations: Abdomen is soft.  Musculoskeletal:     Cervical back: Neck supple.  Skin:    General: Skin  is warm.  Neurological:     Comments: Unable to eval  Psychiatric:     Comments: Unable to eval     Disposition: Status is: Inpatient Remains inpatient appropriate because: IV fluid/antibx  Planned Discharge Destination: Home    Time spent: 35 minutes  Author: Baron Hamper , MD 11/25/2023 12:43 PM  For on call review [email or web address].

## 2023-11-25 NOTE — Progress Notes (Addendum)
 Initial Nutrition Assessment  DOCUMENTATION CODES:   Severe malnutrition in context of acute illness/injury - pt also likely appropriate for malnutrition in the context of chronic illness r/t his dementia dx and slow decline, per wife's report  INTERVENTION:  Once tube placed and appropriate for use, initiate via small bore feeding tube: Osmolite1.5 at 55 ml/h (1320 ml per day) Initiate at rate of 58ml/hr and increase by 68ml/hr Q8 hours until goal rate achieved Prosource TF20 60 ml daily FWF Q4 hours   Provides 2060 kcal, 103 gm protein, 2205 ml free water daily (TF + FWF)  Collect new weight to assess trend s/p diuresis Monitor SLP notes for diet advancement Continue Ensure Enlive po BID (nectar thick), each supplement provides 350 kcal and 20 grams of protein.  Magic cup BID with meals, each supplement provides 290 kcal and 9 grams of protein Add Renal MVI daily Noted thrush on exam; RN present and notified MD  NUTRITION DIAGNOSIS:  Severe Malnutrition related to acute illness as evidenced by moderate fat depletion, moderate muscle depletion.  GOAL:  Patient will meet greater than or equal to 90% of their needs  MONITOR:  PO intake, Labs, Weight trends, Supplement acceptance, TF tolerance, Skin, Diet advancement  REASON FOR ASSESSMENT:  Consult Assessment of nutrition requirement/status, Enteral/tube feeding initiation and management  ASSESSMENT:   Pt with PMH of severe dementia, T2DM, ESRD and liver disease s/p transplant (2016), seizures, BPH, and HLD. Admitted with acute on chronic hypoxic respiratory failure d/t acute on chronic CHF as well as CAP, AKI, UTI and encephalopathy and metabolic acidosis.  3/22 admitted, thoracentesis 3/25 D5 initiated 3/26 D5W increased 183ml/hr 3/27 small bore feeding tube placement by IR scheduled  Patient lives with his spouse, daughter and son and has a home health aid 4 days per week. He was admitted one and a half months ago  for PNA and flui. Suspected aspiration PNA. Family preference is for him to return home and requesting full scope of care. Cortrak placement ordered today for hydration 2/2 hypernatemia.   Spoke to patient's wife at bedside today. She endorses patient was eating PTA, however intake was inconsistent based off his mentation. Discussed importance of nutrition intervention at this time r/t skin integrity issues to sacrum as well as elevated estimated needs 2/2 . She is amicable to initiating nutrition today as well. Cortrak placement unsuccessful and consult entered for IR to place.   24 Hour Recall B: eggs w/ cheese, breakfast meat, and yogurt OR fruit OR toast w/ jelly w/ juice or coffee L: sandwich (tuna fish) w/ watermelon w/ juice or tea D:  meatloaf and mashed potatoes OR rice and green beans w/ tea Snacks: Ensure x2-3 per day  Per chart review, weight ranging from 90-94kg over last six months. Weight trending up over that time as well, however not significantly. His wife reports his UBW as between 200-210lbs two months ago. This is consistent with current body weight.   Admit Weight: 95.3kg Current Weight: 92.2kg  Continues to diurese.  Admitted with significant volume overload. Remains with mild edema to BLEs. Showed over 1L of UOP yesterday. Remains on Lasix every other day. Bowels are moving. Move 1-2x per day at baseline.   Intake/Output Summary (Last 24 hours) at 11/25/2023 1540 Last data filed at 11/25/2023 1349 Gross per 24 hour  Intake --  Output 1250 ml  Net -1250 ml    Net IO Since Admission: -6,552.81 mL [11/25/23 1540]   Drains/Lines:  R AVG:  negative bruit/thrill Foley catheter UOP: 1.3L x24 hours  Patient noted with elevated corrected calcium and remains on calcitriol. Nephrology amicable to discontinuation. Likely r/t immobility, but also with a history of ESRD s/p transplantation.   Meds: darbepoetin alfa, SSI 0-15 w/ meals, PRN senna, cyclosporine Drips: D5W @  150ml/hr IV ABX Keppra  Labs: Na+ 151>153>155 (H) K+ 3.8 (wdl) Corr Ca 11.3 (H) Crt 8.67>6.72>0.94 CRP 15.5 (H) CBGs 194-242 x24 hours A1c 6.4 (09/2023)  NUTRITION - FOCUSED PHYSICAL EXAM:  Flowsheet Row Most Recent Value  Orbital Region Moderate depletion  Upper Arm Region Moderate depletion  Thoracic and Lumbar Region Moderate depletion  Buccal Region Mild depletion  Temple Region Moderate depletion  Clavicle Bone Region Moderate depletion  Clavicle and Acromion Bone Region Moderate depletion  Scapular Bone Region Moderate depletion  Dorsal Hand Mild depletion  Patellar Region Mild depletion  Anterior Thigh Region Moderate depletion  Posterior Calf Region Moderate depletion  Edema (RD Assessment) Mild  Hair Reviewed  Eyes Reviewed  Mouth Reviewed  [significant for thrush, edentulous]  Skin Reviewed  Nails Reviewed   Diet Order:   Diet Order             DIET DYS 2 Room service appropriate? Yes; Fluid consistency: Nectar Thick  Diet effective now            EDUCATION NEEDS:   Education needs have been addressed  Skin:  Skin Assessment: Skin Integrity Issues: Skin Integrity Issues:: Stage I Stage I: sacrum  Last BM:  3/25 - type 6 x1  Height:  Ht Readings from Last 1 Encounters:  11/20/23 6\' 1"  (1.854 m)   Weight:  Wt Readings from Last 1 Encounters:  11/22/23 92.2 kg   Ideal Body Weight:  83.6 kg  BMI:  Body mass index is 26.82 kg/m.  Estimated Nutritional Needs:   Kcal:  1900-2100kcal  Protein:  95-110g  Fluid:  >1.9L/day  Myrtie Cruise MS, RD, LDN Registered Dietitian Clinical Nutrition RD Inpatient Contact Info in Amion

## 2023-11-26 ENCOUNTER — Inpatient Hospital Stay (HOSPITAL_COMMUNITY)

## 2023-11-26 DIAGNOSIS — J189 Pneumonia, unspecified organism: Secondary | ICD-10-CM | POA: Diagnosis not present

## 2023-11-26 LAB — BASIC METABOLIC PANEL WITH GFR
Anion gap: 6 (ref 5–15)
Anion gap: 10 (ref 5–15)
BUN: 57 mg/dL — ABNORMAL HIGH (ref 8–23)
BUN: 52 mg/dL — ABNORMAL HIGH (ref 8–23)
CO2: 20 mmol/L — ABNORMAL LOW (ref 22–32)
CO2: 17 mmol/L — ABNORMAL LOW (ref 22–32)
Calcium: 9.9 mg/dL (ref 8.9–10.3)
Calcium: 9.9 mg/dL (ref 8.9–10.3)
Chloride: 129 mmol/L — ABNORMAL HIGH (ref 98–111)
Chloride: 127 mmol/L — ABNORMAL HIGH (ref 98–111)
Creatinine, Ser: 2.37 mg/dL — ABNORMAL HIGH (ref 0.61–1.24)
Creatinine, Ser: 2.36 mg/dL — ABNORMAL HIGH (ref 0.61–1.24)
GFR, Estimated: 28 mL/min — ABNORMAL LOW (ref >60)
GFR, Estimated: 28 mL/min — ABNORMAL LOW (ref >60)
Glucose, Bld: 169 mg/dL — ABNORMAL HIGH (ref 70–99)
Glucose, Bld: 133 mg/dL — ABNORMAL HIGH (ref 70–99)
Potassium: 3.7 mmol/L (ref 3.5–5.1)
Potassium: 3.9 mmol/L (ref 3.5–5.1)
Sodium: 155 mmol/L — ABNORMAL HIGH (ref 135–145)
Sodium: 154 mmol/L — ABNORMAL HIGH (ref 135–145)

## 2023-11-26 LAB — C-REACTIVE PROTEIN: CRP: 11.2 mg/dL — ABNORMAL HIGH (ref <1.0)

## 2023-11-26 LAB — CBC
HCT: 23.1 % — ABNORMAL LOW (ref 39.0–52.0)
Hemoglobin: 7.2 g/dL — ABNORMAL LOW (ref 13.0–17.0)
MCH: 24.3 pg — ABNORMAL LOW (ref 26.0–34.0)
MCHC: 31.2 g/dL (ref 30.0–36.0)
MCV: 78 fL — ABNORMAL LOW (ref 80.0–100.0)
Platelets: 292 10*3/uL (ref 150–400)
RBC: 2.96 MIL/uL — ABNORMAL LOW (ref 4.22–5.81)
RDW: 16.8 % — ABNORMAL HIGH (ref 11.5–15.5)
WBC: 7.2 10*3/uL (ref 4.0–10.5)
nRBC: 0 % (ref 0.0–0.2)

## 2023-11-26 LAB — GLUCOSE, CAPILLARY
Glucose-Capillary: 144 mg/dL — ABNORMAL HIGH (ref 70–99)
Glucose-Capillary: 103 mg/dL — ABNORMAL HIGH (ref 70–99)
Glucose-Capillary: 143 mg/dL — ABNORMAL HIGH (ref 70–99)
Glucose-Capillary: 118 mg/dL — ABNORMAL HIGH (ref 70–99)

## 2023-11-26 MED ORDER — LIDOCAINE VISCOUS HCL 2 % MT SOLN
6.0000 mL | Freq: Once | OROMUCOSAL | Status: AC
  Administered 2023-11-26: 6 mL via OROMUCOSAL

## 2023-11-26 MED ORDER — OSMOLITE 1.5 CAL PO LIQD
1000.0000 mL | ORAL | Status: DC
  Administered 2023-11-26 – 2023-11-29 (×5): 1000 mL
  Filled 2023-11-26: qty 1000

## 2023-11-26 MED ORDER — IOHEXOL 300 MG/ML  SOLN
15.0000 mL | Freq: Once | INTRAMUSCULAR | Status: AC | PRN
  Administered 2023-11-26: 15 mL

## 2023-11-26 NOTE — Progress Notes (Signed)
 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  Patient was admitted with hypotension (blood pressure 70/50 mmHg), shortness of breath, lethargy, pulmonary vascular congestion, bibasilar atelectasis, mild worsening of renal function (baseline CKD 3B/3a), elevated cardiac BNP (231), CO2 of 14, and mild leukocytosis (11.1) with left shift.  H&P note documents possible pneumonia with loculated pleural effusion.  Patient underwent ultrasound-guided right thoracentesis on 11/21/2023 that yielded 400 mL of amber versus blood-tinged fluid.  Pleural fluid culture has not grown any organism to date.  Urine culture has grown Enterococcus faecalis.  IV vancomycin has been discontinued.  IV cefepime has been changed to IV Unasyn.  Tmax of 100.8 noted overnight.  Patient was significantly volume overloaded on presentation.  Echocardiogram revealed diastolic dysfunction.  Started on IV Lasix 100 Mg 3 times daily.  Lasix has been decreased to 80 Mg daily from today, due to hyponatremia.  Nephrology team is managing patient's volume overload and diuretics.  Nephrology to advise if to hold Myfortic.     11/23/2023: Patient seen alongside patient's wife.  Patient is more lethargic today.  Sodium is 150.  Diuretics have been decreased significantly.  Discussed gentle hydration with D5W with the nephrology team.  As documented above, vancomycin has been discontinued.  IV cefepime has been changed to IV Unasyn.  Assessment and Plan: Acute on chronic hypoxic resp failure  due to acute on chronic diastolic CHF - Lasix per nephro discretion  - Crestor 5 mg PO at bedtime  - Plavix 75 mg PO daily    CAP  - Pulmicort bid - Duoneb bid    AKI of cadaveric transplanted kidney 3b & liver transplant  - IV D5W at nephro discretion - Cyclosporine 125 mg bid    UTI due to e.faecalis  - IV unasyn 3g q12 - Finasteride 5 mg PO at bedtime   - Flomax 0.4 mg PO daily    Metabolic acidosis  - Nephro following  - Lasix PRN   Acute metabolic encephalopathy  - Complicating care    7. Seizure  - IV keppra 500 mg bid   Subjective: Pt seen and examined at the bedside. Early this morning the pt had a successful placement of his Cortrak by IR. Tube feeding initiated. Today is day 6 of antibx and we will complete 10 days total (CRP is downtrending 15 -->11).  Physical Exam: Vitals:   11/26/23 0722 11/26/23 0903 11/26/23 0904 11/26/23 1140  BP: (!) 123/56   (!) 128/57  Pulse: 79   70  Resp: 20   20  Temp: 99 F (37.2 C)   97.8 F (36.6 C)  TempSrc: Axillary   Oral  SpO2: 95% 94% 94% 100%  Weight:      Height:       Constitutional:      Comments: More awake today  HENT:     Head: Normocephalic.  Cardiovascular:     Rate and Rhythm: Normal rate.  Pulmonary:     Effort: Pulmonary effort is normal.  Abdominal:     Palpations: Abdomen is soft.  Musculoskeletal:     Cervical back: Neck  supple.  Skin:    General: Skin is warm.  Neurological:     Comments: Calm and able to answer simple questions  Psychiatric:     Comments: Mood neutral     Disposition: Status is: Inpatient Remains inpatient appropriate because: IV antibx  Planned Discharge Destination: Home    Time spent: 35 minutes  Author: Baron Hamper , MD 11/26/2023 1:26 PM  For on call review [email or web address].

## 2023-11-26 NOTE — Plan of Care (Signed)

## 2023-11-26 NOTE — Plan of Care (Signed)

## 2023-11-26 NOTE — Progress Notes (Signed)
 SLP Cancellation Note  Patient Details Name: Gilbert Wilson MRN: [id number] DOB: [birth year]   Cancelled treatment:       Reason Eval/Treat Not Completed: Other (comment) SLP spoke with daughter in room regarding swallow function. Patient had NG placed in IR and per daughter, MD wants to run meds for the next 48-72 hours and avoid any PO's at this time. As patient has been on baseline diet (Dys 2 (minced), nectar thick liquids during this hospital admission, SLP not recommending further skilled treatment or PO changes. SLP advised daughter to request SLP if she has concerns with patient's toleration of PO diet. SLP to s/o at this time.  Angela Nevin, MA, CCC-SLP Speech Therapy

## 2023-11-26 NOTE — Progress Notes (Signed)
 [facility] Progress Note  Subjective:  Cortrak was attempted on the floor yesterday but they were unsuccessful.  Radiology transport is here to take him to radiology for cortrak placement under fluoroscopy.  He had 1.2 liters UOP Over 3/26.   Spoke with his daughter at bedside.   Review of systems:     Unable to obtain secondary to confusion    Vitals:   11/25/23 1951 11/25/23 2308 11/26/23 0435 11/26/23 0722  BP:  (!) 116/51 (!) 123/52 (!) 123/56  Pulse:  67 67 79  Resp:  20 17 20   Temp:  99.6 F (37.6 C) 99.2 F (37.3 C) 99 F (37.2 C)  TempSrc:  Oral Axillary Axillary  SpO2: 100% 98% 99% 95%  Weight:      Height:        Physical Exam:    General elderly male in bed in no acute distress HEENT normocephalic atraumatic extraocular movements intact sclera anicteric Neck supple trachea midline Lungs clear anteriorly on auscultation Heart S1S2 no rub Abdomen soft nontender nondistended Extremities no edema  Psych no anxiety or agitation  Neuro patient tracks and appears to try to follow simple command today - moves hand when I ask him to show me his thumb GU foley catheter in place  RUE AVF without bruit or thrill        Renal-related home meds: Hydralazine 100 bid Myfortic 180 bid Norvasc 5 bid Cyclosporine 125mg  bid Lasix 20 mg every other day Losartan 50mg  hs Others: flomax, statin, keppra, proscar, plavix, PPI   Date                             Creat               eGFR (ml/min) 2022                            1.88- 2.00        34- 37 ml/min Mar- dec 2023             2.80 >> 1.65    23 >> 43 ml/min Aug- sept 2024           2.44                 27 ml/min                     1/27- 10/04/23               2.43 >> 1.57    27 >> 41 ml/min           3/21                             2.50 11/21/23                        2.32                 28     Assessment/ Plan: 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 Creat  here 2.5 on admission.  H/o renal transplant 2016. Pt was vol overloaded w/ diffuse UE/ LE edema and increase work of breathing initially.   - lasix intermittently as needed - defer today  -  continue finasteride - foley catheter was ordered for hx of urinary retention requiring BID in/out cath at home for the 3 weeks preceding admission  - improving with supportive management  - D5W currently at 125 ml/hr  Metabolic acidosis -  off of bicarb for now given hypernatremia.  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  - continue cyclosporine  - he has a UTI so we are currently holding myfortic and will need to resume once antibiotics are complete   Fluid volume overload - diffuse LE/ UE edema. Now improved. ECHO done, normal LVEF.  Diuretics as above  AMS - felt secondary to hypernatremia or cefepime.  His cefepime was discontinued per primary team.  Note that this hypernatremia will likely not get better without enteric access for increased free water.  Improved slightly today   Dementia - his wife reports a history of dementia.  He does recognize her at baseline and is much more communicative.  Goals of care discussions per primary team.  At this time she does want to treat treatable conditions such as the above  CKD stage 3b - Note baseline Cr 1.8 - 2.0  HTN - holding losartan for now w/ AKI.  Amlodipine and hydralazine were discontinued due to hypotension   Hypernatremia - free water deficit.  off bicarb for now.   We have adjusted diuretics.   BID BMP for now On D5W at 125 ml/hr.  Optimize DM. If sugars uncontrolled may need to switch to 1/2 NS as 1/4 NS not available.   Start free water 150 mL every 4 hours.  Noted prior to admission as well and not surprising with hx dementia.  His hypernatremia will likely not improve without enteric access to free water.   Discussed with his wife.     Anemia of CKD - on ESA and iron outpatient.  His last iron infusions were on 3/3 and 3/10 and his last EPO was 3/9.  S/p aranesp 60 mcg once on 3/25 and I have ordered aranesp 60 mcg weekly on Tuesdays   H/o CVA - w/ hemiparesis   Disposition - continue inpatient monitoring    Recent Labs  Lab 11/23/23 0227 11/23/23 1128 11/23/23 1129 11/24/23 0229 11/24/23 12/27/27 11/25/23 1414 11/26/23 0235  HGB 8.5*  --   --   --   --   --  7.2*  ALBUMIN 2.4* 2.4*  --  2.2*  --   --   --   CALCIUM 10.4* 9.9  --  10.0   < > 10.2 9.9  PHOS >30.0* 3.1 3.2 3.0  --   --   --   CREATININE 2.58* 2.51*  --  2.73*   < > 2.51* 2.37*  K 3.9 4.5  --  3.8   < > 3.9 3.7   < > = values in this interval not displayed.   No results for input(s): "IRON", "TIBC", "FERRITIN" in the last 168 hours. Inpatient medications:  budesonide (PULMICORT) nebulizer solution  0.25 mg Nebulization BID   Chlorhexidine Gluconate Cloth  6 each Topical Daily   clopidogrel  75 mg Per Tube Daily   cycloSPORINE  125 mg Per Tube BID   [START ON 12/01/2023] darbepoetin (ARANESP) injection - NON-DIALYSIS  60 mcg Subcutaneous Q Tue-1800   feeding supplement  237 mL Per Tube BID BM   finasteride  5 mg Oral QPM   free water  200 mL Per Tube Q4H   heparin  5,000 Units Subcutaneous Q8H   insulin aspart  0-15 Units Subcutaneous TID WC   ipratropium-albuterol  3 mL Nebulization BID   multivitamin  1 tablet Per Tube QHS   nystatin  5 mL Oral QID   rosuvastatin  5 mg Per Tube QPM   tamsulosin  0.4 mg Oral Daily    ampicillin-sulbactam (UNASYN) IV Stopped (11/26/23 0052)   levETIRAcetam Stopped (11/25/23 2201)   acetaminophen **OR** acetaminophen, melatonin, senna-docusate     Estanislado Emms, MD 7:58 AM 11/26/2023

## 2023-11-27 DIAGNOSIS — J189 Pneumonia, unspecified organism: Secondary | ICD-10-CM | POA: Diagnosis not present

## 2023-11-27 LAB — GLUCOSE, CAPILLARY
Glucose-Capillary: 178 mg/dL — ABNORMAL HIGH (ref 70–99)
Glucose-Capillary: 137 mg/dL — ABNORMAL HIGH (ref 70–99)
Glucose-Capillary: 147 mg/dL — ABNORMAL HIGH (ref 70–99)
Glucose-Capillary: 187 mg/dL — ABNORMAL HIGH (ref 70–99)

## 2023-11-27 LAB — BASIC METABOLIC PANEL WITH GFR
Anion gap: 5 (ref 5–15)
BUN: 52 mg/dL — ABNORMAL HIGH (ref 8–23)
CO2: 19 mmol/L — ABNORMAL LOW (ref 22–32)
Calcium: 9.8 mg/dL (ref 8.9–10.3)
Chloride: 130 mmol/L — ABNORMAL HIGH (ref 98–111)
Creatinine, Ser: 2.21 mg/dL — ABNORMAL HIGH (ref 0.61–1.24)
GFR, Estimated: 30 mL/min — ABNORMAL LOW (ref >60)
Glucose, Bld: 145 mg/dL — ABNORMAL HIGH (ref 70–99)
Potassium: 4.2 mmol/L (ref 3.5–5.1)
Sodium: 154 mmol/L — ABNORMAL HIGH (ref 135–145)

## 2023-11-27 LAB — C-REACTIVE PROTEIN: CRP: 8.4 mg/dL — ABNORMAL HIGH (ref <1.0)

## 2023-11-27 MED ORDER — FREE WATER
250.0000 mL | Status: DC
  Administered 2023-11-27 – 2023-12-02 (×30): 250 mL

## 2023-11-27 MED ORDER — AMLODIPINE BESYLATE 5 MG PO TABS
5.0000 mg | ORAL_TABLET | Freq: Every day | ORAL | Status: DC
  Administered 2023-11-27 – 2023-12-03 (×7): 5 mg via ORAL
  Filled 2023-11-27 (×7): qty 1

## 2023-11-27 NOTE — Progress Notes (Signed)
 [facility] NEPHROLOGY PROGRESS NOTE  Assessment/ Plan: Pt is a 78 y.o. 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 Creat here 2.5 on admission.  H/o renal transplant Dec 25, 2014. Pt was vol overloaded w/ diffuse UE/ LE edema and increase work of breathing initially.   - lasix intermittently as needed - defer today  - continue finasteride - foley catheter was ordered for hx of urinary retention requiring BID in/out cath at home for the 3 weeks preceding admission  -Creatinine is stable, monitor lab, strict ins and outs.  # Hypernatremia - free water deficit. off bicarb for now.  Increase free water from the tube.  Diuretics on hold.  # Metabolic acidosis -  off of bicarb for now given hypernatremia.  Diuretics as above.  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   #Deceased donor renal transplant Dec 25, 2014 - with AKI on CKD of allograft as noted.  - continue cyclosporine  - he has a UTI so we are currently holding myfortic and will need to resume once antibiotics are complete.  # Fluid volume overload - diffuse LE/ UE edema. Now improved. ECHO done, normal LVEF.  Diuretics as above   # HTN - holding losartan for now w/ AKI.  Resume Amlodipine as blood pressure started going up.  #Anemia of CKD - on ESA and iron outpatient.  His last iron infusions were on 3/3 and 3/10 and his last EPO was 3/9. S/p aranesp 60 mcg once on 3/25 and weekly aranesp 60 mcg weekly.     H/o CVA - w/ hemiparesis.  Discussed with the patient's son who was presented with him at the bedside.  Also spoke with his daughter on the phone.  Subjective: Seen and examined at bedside.  No new event.  Urine output is recorded around 800 cc.  Sodium remains high but is stable.  No new event. Objective Vital signs in last 24 hours: Vitals:   11/26/23 2007 11/27/23 0006 11/27/23 0418 11/27/23 0709  BP: (!) 142/64 (!) 137/53 (!) 161/62 (!)  150/59  Pulse: 77 66 66 65  Resp: 20 18 20  (!) 30  Temp: 99 F (37.2 C) 98.4 F (36.9 C) 99.1 F (37.3 C) 99.1 F (37.3 C)  TempSrc: Axillary Axillary Axillary Axillary  SpO2: 97% 98% 98% 96%  Weight:      Height:       Weight change:   Intake/Output Summary (Last 24 hours) at 11/27/2023 0831 Last data filed at 11/27/2023 0981 Gross per 24 hour  Intake 1686.62 ml  Output 800 ml  Net 886.62 ml       Labs: RENAL PANEL Recent Labs  Lab 11/20/23 2000 11/20/23 2017 11/22/23 0213 11/23/23 0227 11/23/23 1128 11/23/23 1129 11/24/23 0229 11/24/23 2027/12/25 11/25/23 0226 11/25/23 1414 11/26/23 0235 11/26/23 1322 11/27/23 0221  NA 144   < > 147* 150* 151*  --  151*   < > 155* 156* 155* 154* 154*  K 4.6   < > 4.1 3.9 4.5  --  3.8   < > 3.8 3.9 3.7 3.9 4.2  CL 118*   < > 121* 124* 125*  --  123*   < > 128* 129* 129* 127* 130*  CO2 15*   < > 13* 17* 15*  --  16*   < > 17* 20* 20* 17* 19*  GLUCOSE 191*   < >  182* 174* 164*  --  222*   < > 242* 156* 169* 133* 145*  BUN 62*   < > 62* 67* 73*  --  67*   < > 67* 61* 57* 52* 52*  CREATININE 2.35*   < > 2.32* 2.58* 2.51*  --  2.73*   < > 2.52* 2.51* 2.37* 2.36* 2.21*  CALCIUM 10.9*   < > 10.2 10.4* 9.9  --  10.0   < > 10.1 10.2 9.9 9.9 9.8  MG  --   --  2.0  --   --   --   --   --   --   --   --   --   --   PHOS  --   --  2.9 >30.0* 3.1 3.2 3.0  --   --   --   --   --   --   ALBUMIN 3.2*  --  2.6* 2.4* 2.4*  --  2.2*  --   --   --   --   --   --    < > = values in this interval not displayed.    Liver Function Tests: Recent Labs  Lab 11/20/23 2000 11/22/23 0213 11/23/23 0227 11/23/23 1128 11/24/23 0229  AST 18  --   --   --   --   ALT 19  --   --   --   --   ALKPHOS 73  --   --   --   --   BILITOT 0.9  --   --   --   --   PROT 7.6  --   --   --   --   ALBUMIN 3.2*   < > 2.4* 2.4* 2.2*   < > = values in this interval not displayed.   No results for input(s): "LIPASE", "AMYLASE" in the last 168 hours. No results for  input(s): "AMMONIA" in the last 168 hours. CBC: Recent Labs    09/29/23 0632 09/30/23 0734 11/20/23 2057 11/21/23 0515 11/22/23 0213 11/23/23 0227 11/26/23 0235  HGB 8.7*   < > 9.2* 9.3* 8.7* 8.5* 7.2*  MCV 75.8*   < > 77.1* 77.9* 76.3* 75.4* 78.0*  VITAMINB12 380  --   --   --   --   --   --   FOLATE 13.8  --   --   --   --   --   --   TIBC 176*  --   --   --   --   --   --   IRON 7*  --   --   --   --   --   --    < > = values in this interval not displayed.    Cardiac Enzymes: No results for input(s): "CKTOTAL", "CKMB", "CKMBINDEX", "TROPONINI" in the last 168 hours. CBG: Recent Labs  Lab 11/26/23 0618 11/26/23 1138 11/26/23 1628 11/26/23 2113 11/27/23 0621  GLUCAP 144* 103* 143* 118* 178*    Iron Studies: No results for input(s): "IRON", "TIBC", "TRANSFERRIN", "FERRITIN" in the last 72 hours. Studies/Results: DG Abd 1 View Result Date: 11/26/2023 CLINICAL DATA:  [id number] Encounter for feeding tube placement [id number] EXAM: ABDOMEN - 1 VIEW COMPARISON:  11/20/2023 FINDINGS: Two C-arm fluoroscopic images were obtained intraoperatively and submitted for post operative interpretation. Images demonstrate enteric tube in the stomach with distal tip terminating in the gastric antrum. Enteric contrast fills the distal stomach and proximal duodenum 4 minutes  54 seconds of fluoroscopy time utilized. Radiation dose: 34.5 mGy. Please see the performing provider's procedural report for further detail. IMPRESSION: Intraoperative fluoroscopic guidance for enteric tube placement. Electronically Signed   By: Duanne Guess D.O.   On: 11/26/2023 10:27    Medications: Infusions:  ampicillin-sulbactam (UNASYN) IV Stopped (11/27/23 0253)   feeding supplement (OSMOLITE 1.5 CAL) 45 mL/hr at 11/27/23 0647   levETIRAcetam Stopped (11/26/23 2146)    Scheduled Medications:  budesonide (PULMICORT) nebulizer solution  0.25 mg Nebulization BID   Chlorhexidine Gluconate Cloth  6 each Topical Daily    clopidogrel  75 mg Per Tube Daily   cycloSPORINE  125 mg Per Tube BID   [START ON 12/01/2023] darbepoetin (ARANESP) injection - NON-DIALYSIS  60 mcg Subcutaneous Q Tue-1800   feeding supplement  237 mL Per Tube BID BM   finasteride  5 mg Oral QPM   free water  200 mL Per Tube Q4H   heparin  5,000 Units Subcutaneous Q8H   insulin aspart  0-15 Units Subcutaneous TID WC   ipratropium-albuterol  3 mL Nebulization BID   multivitamin  1 tablet Per Tube QHS   nystatin  5 mL Oral QID   rosuvastatin  5 mg Per Tube QPM   tamsulosin  0.4 mg Oral Daily    have reviewed scheduled and prn medications.  Physical Exam: General:NAD, comfortable Heart:RRR, s1s2 nl Lungs:clear b/l, no crackle Abdomen:soft, Non-tender, non-distended Extremities:No edema Neurology: Alert, awake and following commands.  Rashidi Loh Prasad Eppie Barhorst 11/27/2023,8:31 AM  LOS: 6 days

## 2023-11-27 NOTE — Progress Notes (Signed)
 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  Patient was admitted with hypotension (blood pressure 70/50 mmHg), shortness of breath, lethargy, pulmonary vascular congestion, bibasilar atelectasis, mild worsening of renal function (baseline CKD 3B/3a), elevated cardiac BNP (231), CO2 of 14, and mild leukocytosis (11.1) with left shift.  H&P note documents possible pneumonia with loculated pleural effusion.  Patient underwent ultrasound-guided right thoracentesis on 11/21/2023 that yielded 400 mL of amber versus blood-tinged fluid.  Pleural fluid culture has not grown any organism to date.  Urine culture has grown Enterococcus faecalis.  IV vancomycin has been discontinued.  IV cefepime has been changed to IV Unasyn.  Tmax of 100.8 noted overnight.  Patient was significantly volume overloaded on presentation.  Echocardiogram revealed diastolic dysfunction.  Started on IV Lasix 100 Mg 3 times daily.  Lasix has been decreased to 80 Mg daily from today, due to hyponatremia.  Nephrology team is managing patient's volume overload and diuretics.  Nephrology to advise if to hold Myfortic.     11/23/2023: Patient seen alongside patient's wife.  Patient is more lethargic today.  Sodium is 150.  Diuretics have been decreased significantly.  Discussed gentle hydration with D5W with the nephrology team.  As documented above, vancomycin has been discontinued.  IV cefepime has been changed to IV Unasyn.  Assessment and Plan: Acute on chronic hypoxic resp failure  due to acute on chronic diastolic CHF - Lasix per nephro discretion  - Crestor 5 mg PO at bedtime  - Plavix 75 mg PO daily    CAP  - Pulmicort bid - Duoneb bid    AKI of cadaveric transplanted kidney 3b & liver transplant  - Cyclosporine 125 mg bid    UTI due to e.faecalis  - IV unasyn 3g q12 - Finasteride 5 mg PO at bedtime   - Flomax 0.4 mg PO daily    Metabolic acidosis  - Nephro following  - Lasix PRN   Acute metabolic encephalopathy  - Complicating care    7. Seizure  - IV keppra 500 mg bid   8. HTN - Norvasc 5 mg PO daily   Subjective: Pt seen and examined at the bedside. He was sleeping this morning. Na+ elevated but stable. Free water now 250 mL q4 hr. Appreciate nephrology following and assisting.  Physical Exam: Vitals:   11/27/23 0418 11/27/23 0709 11/27/23 1020 11/27/23 1106  BP: (!) 161/62 (!) 150/59 (!) 150/59 (!) 131/53  Pulse: 66 65  64  Resp: 20 (!) 30  (!) 24  Temp: 99.1 F (37.3 C) 99.1 F (37.3 C)  97.9 F (36.6 C)  TempSrc: Axillary Axillary  Axillary  SpO2: 98% 96%  100%  Weight:      Height:       Constitutional:      Comments: Sleeping today HENT:     Head: Normocephalic.  Cardiovascular:     Rate and Rhythm: Normal rate.  Pulmonary:     Effort: Pulmonary effort is normal.  Abdominal:     Palpations: Abdomen is soft.  Musculoskeletal:     Cervical back: Neck supple.  Skin:  General: Skin is warm.  Neurological:     Comments: Sleeping today  Psychiatric:     Comments: Mood neutral     Disposition: Status is: Inpatient Remains inpatient appropriate because: IV antibx and serial labs   Planned Discharge Destination: Home    Time spent: 35 minutes  Author: Baron Hamper , MD 11/27/2023 1:52 PM  For on call review [email or web address].

## 2023-11-27 NOTE — Plan of Care (Signed)
  Problem: Fluid Volume: Goal: Ability to maintain a balanced intake and output will improve Outcome: Progressing   Problem: Metabolic: Goal: Ability to maintain appropriate glucose levels will improve Outcome: Progressing   Problem: Skin Integrity: Goal: Risk for impaired skin integrity will decrease Outcome: Progressing   Problem: Coping: Goal: Level of anxiety will decrease Outcome: Progressing

## 2023-11-27 NOTE — Progress Notes (Addendum)
 Nutrition Follow-up  DOCUMENTATION CODES:   Severe malnutrition in context of acute illness/injury  INTERVENTION:  Continue tube feeding via Cortrak: Osmolite1.5 at 55 ml/h (1320 ml per day) Prosource TF20 60 ml daily FWF increased (per MD) Q4 hours    Provides 2060 kcal, 103 gm protein, 2505 ml free water daily (TF + FWF)   Continue Ensure Enlive po BID (nectar thick), each supplement provides 350 kcal and 20 grams of protein.  Magic cup BID with meals, each supplement provides 290 kcal and 9 grams of protein Continue Renal MVI  Continue treatment for thrush  NUTRITION DIAGNOSIS:  Severe Malnutrition related to acute illness as evidenced by moderate fat depletion, moderate muscle depletion.  GOAL:  Patient will meet greater than or equal to 90% of their needs  MONITOR:  PO intake, Labs, Weight trends, Supplement acceptance, TF tolerance, Skin, Diet advancement  REASON FOR ASSESSMENT:  Consult Assessment of nutrition requirement/status, Enteral/tube feeding initiation and management  ASSESSMENT:   Pt with PMH of severe dementia, T2DM, ESRD and liver disease s/p transplant (2016), seizures, BPH, and HLD. Admitted with acute on chronic hypoxic respiratory failure d/t acute on chronic CHF as well as CAP, AKI, UTI and encephalopathy and metabolic acidosis.  3/22 admitted, thoracentesis 3/25 D5 initiated 3/26 D5W increased 199ml/hr 3/27 small bore tube placed by IF, TF initiated  Pt tolerating tube feed regimen, which is now at goal rate. No N/V/C/D noted or endorsed. Patient daughter at bedside today. Discussed continued indication for nutrition while admitted to supplement poor intake 2/2 fluctuating mentation. Endorsed ability to wean based off alertness and improved intake. Mainly indicated for hydration administration 2/2 hypernatremia. Patient much more alert today. Daughter reports he is returning to baseline. Current diet texture is also patient's baseline. Speech  has signed off. MD wanting to continue hydration/medication administration via tube for next 24-48 hours and will likely d/c tube and TF at that time.  Admit Weight: 95.3kg Current Weight: 92.2kg  No significant swelling observed or reported today. Wt trend down since admission, however noted to be net negative that much since admission. Diuretics currently on hold.    Intake/Output Summary (Last 24 hours) at 11/27/2023 1414 Last data filed at 11/27/2023 0647 Gross per 24 hour  Intake 1188.95 ml  Output 800 ml  Net 388.95 ml    Net IO Since Admission: -2,684.51 mL [11/27/23 1414]   Hypernatremia continues. Free water flushes increased. Crt stable. CRP down trending.   Meds: darbepoetin alfa, cyclosporine, SSI 0-15 TID, renal MVI, nystatin, IV ABX  Labs: Na+ 154>154 (H) K+ 3.9>4.2 (wdl) PHOS >30---->3.0 (wdl) CRP 15.5>11.2>8.4 (H) CBGs 133-169 x24 hours A1c 6.4 (09/2023)    Diet Order:   Diet Order             DIET DYS 2 Room service appropriate? Yes; Fluid consistency: Nectar Thick  Diet effective now             EDUCATION NEEDS:   Education needs have been addressed  Skin:  Skin Assessment: Skin Integrity Issues: Skin Integrity Issues:: Stage I Stage I: sacrum  Last BM:  3/27 - type 6 x1  Height:  Ht Readings from Last 1 Encounters:  11/20/23 6\' 1"  (1.854 m)   Weight:  Wt Readings from Last 1 Encounters:  11/22/23 92.2 kg   Ideal Body Weight:  83.6 kg  BMI:  Body mass index is 26.82 kg/m.  Estimated Nutritional Needs:   Kcal:  1900-2100kcal  Protein:  95-110g  Fluid:  >  1.9L/day  Myrtie Cruise MS, RD, LDN Registered Dietitian Clinical Nutrition RD Inpatient Contact Info in Amion

## 2023-11-28 ENCOUNTER — Inpatient Hospital Stay (HOSPITAL_COMMUNITY)

## 2023-11-28 DIAGNOSIS — M7989 Other specified soft tissue disorders: Secondary | ICD-10-CM | POA: Diagnosis not present

## 2023-11-28 DIAGNOSIS — J189 Pneumonia, unspecified organism: Secondary | ICD-10-CM | POA: Diagnosis not present

## 2023-11-28 LAB — BASIC METABOLIC PANEL WITH GFR
Anion gap: 8 (ref 5–15)
BUN: 44 mg/dL — ABNORMAL HIGH (ref 8–23)
CO2: 17 mmol/L — ABNORMAL LOW (ref 22–32)
Calcium: 9.5 mg/dL (ref 8.9–10.3)
Chloride: 126 mmol/L — ABNORMAL HIGH (ref 98–111)
Creatinine, Ser: 1.91 mg/dL — ABNORMAL HIGH (ref 0.61–1.24)
GFR, Estimated: 36 mL/min — ABNORMAL LOW (ref >60)
Glucose, Bld: 205 mg/dL — ABNORMAL HIGH (ref 70–99)
Potassium: 4.3 mmol/L (ref 3.5–5.1)
Sodium: 151 mmol/L — ABNORMAL HIGH (ref 135–145)

## 2023-11-28 LAB — GLUCOSE, CAPILLARY
Glucose-Capillary: 188 mg/dL — ABNORMAL HIGH (ref 70–99)
Glucose-Capillary: 175 mg/dL — ABNORMAL HIGH (ref 70–99)
Glucose-Capillary: 180 mg/dL — ABNORMAL HIGH (ref 70–99)
Glucose-Capillary: 179 mg/dL — ABNORMAL HIGH (ref 70–99)

## 2023-11-28 LAB — C-REACTIVE PROTEIN: CRP: 6.3 mg/dL — ABNORMAL HIGH (ref <1.0)

## 2023-11-28 MED ORDER — ENSURE ENLIVE PO LIQD
237.0000 mL | Freq: Two times a day (BID) | ORAL | Status: DC
  Administered 2023-11-28 – 2023-12-03 (×10): 237 mL via ORAL

## 2023-11-28 NOTE — Plan of Care (Signed)
  Problem: Skin Integrity: Goal: Risk for impaired skin integrity will decrease Outcome: Progressing   Problem: Coping: Goal: Level of anxiety will decrease Outcome: Progressing   Problem: Elimination: Goal: Will not experience complications related to urinary retention Outcome: Progressing   Problem: Safety: Goal: Ability to remain free from injury will improve Outcome: Progressing

## 2023-11-28 NOTE — Progress Notes (Signed)
 [facility] NEPHROLOGY PROGRESS NOTE  Assessment/ Plan: Pt is a 78 y.o. 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 Creat here 2.5 on admission.  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 Pt was vol overloaded w/ diffuse UE/ LE edema and increase work of breathing initially.   - lasix intermittently as needed - defer today  - continue finasteride - foley catheter was ordered for hx of urinary retention requiring BID in/out cath at home for the 3 weeks preceding admission  -Creatinine is stable at around baseline, monitor lab, strict ins and outs.  # Hypernatremia - free water deficit. off bicarb for now.  Continue free water from the tube.  Diuretics on hold.  # Metabolic acidosis -  off of bicarb for now given hypernatremia.  Diuretics as above.  #H/o liver transplant - at same time as renal transplant in 10-Dec-2014.   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  - continue cyclosporine  - he has a UTI so we are currently holding myfortic and will need to resume once antibiotics are complete.  # Fluid volume overload - diffuse LE/ UE edema. Now improved. ECHO done, normal LVEF.  Diuretics as above   # HTN - holding losartan for now w/ AKI.  Resume Amlodipine as blood pressure started going up.  #Anemia of CKD - on ESA and iron outpatient.  His last iron infusions were on 3/3 and 3/10 and his last EPO was 3/9. S/p aranesp 60 mcg once on 3/25 and weekly aranesp 60 mcg weekly.     H/o CVA - w/ hemiparesis.  Discussed with patient's brother at the bedside.  Subjective: Seen and examined at bedside.  Urine output is recorded around 1.5 L.  Labs improving.  He looks more alert awake today.  No new event.  Objective Vital signs in last 24 hours: Vitals:   11/28/23 0404 11/28/23 0717 11/28/23 0719 11/28/23 0748  BP: (!) 138/56   (!) 148/65  Pulse: 78     Resp: (!) 37     Temp: (!) 100.4 F (38 C)   99.6  F (37.6 C)  TempSrc: Axillary   Oral  SpO2: 97% 100% 100%   Weight: 88.8 kg     Height:       Weight change:   Intake/Output Summary (Last 24 hours) at 11/28/2023 0916 Last data filed at 11/28/2023 0418 Gross per 24 hour  Intake 1348.92 ml  Output 1550 ml  Net -201.08 ml       Labs: RENAL PANEL Recent Labs  Lab 11/22/23 0213 11/23/23 0227 11/23/23 1128 11/23/23 1129 11/24/23 0229 11/24/23 2029 11/25/23 1414 11/26/23 0235 11/26/23 1322 11/27/23 0221 11/28/23 0225  NA 147* 150* 151*  --  151*   < > 156* 155* 154* 154* 151*  K 4.1 3.9 4.5  --  3.8   < > 3.9 3.7 3.9 4.2 4.3  CL 121* 124* 125*  --  123*   < > 129* 129* 127* 130* 126*  CO2 13* 17* 15*  --  16*   < > 20* 20* 17* 19* 17*  GLUCOSE 182* 174* 164*  --  222*   < > 156* 169* 133* 145* 205*  BUN 62* 67* 73*  --  67*   < > 61* 57* 52* 52* 44*  CREATININE 2.32* 2.58* 2.51*  --  2.73*   < >  2.51* 2.37* 2.36* 2.21* 1.91*  CALCIUM 10.2 10.4* 9.9  --  10.0   < > 10.2 9.9 9.9 9.8 9.5  MG 2.0  --   --   --   --   --   --   --   --   --   --   PHOS 2.9 >30.0* 3.1 3.2 3.0  --   --   --   --   --   --   ALBUMIN 2.6* 2.4* 2.4*  --  2.2*  --   --   --   --   --   --    < > = values in this interval not displayed.    Liver Function Tests: Recent Labs  Lab 11/23/23 0227 11/23/23 1128 11/24/23 0229  ALBUMIN 2.4* 2.4* 2.2*   No results for input(s): "LIPASE", "AMYLASE" in the last 168 hours. No results for input(s): "AMMONIA" in the last 168 hours. CBC: Recent Labs    09/29/23 0632 09/30/23 0734 11/20/23 2057 11/21/23 0515 11/22/23 0213 11/23/23 0227 11/26/23 0235  HGB 8.7*   < > 9.2* 9.3* 8.7* 8.5* 7.2*  MCV 75.8*   < > 77.1* 77.9* 76.3* 75.4* 78.0*  VITAMINB12 380  --   --   --   --   --   --   FOLATE 13.8  --   --   --   --   --   --   TIBC 176*  --   --   --   --   --   --   IRON 7*  --   --   --   --   --   --    < > = values in this interval not displayed.    Cardiac Enzymes: No results for  input(s): "CKTOTAL", "CKMB", "CKMBINDEX", "TROPONINI" in the last 168 hours. CBG: Recent Labs  Lab 11/27/23 0621 11/27/23 1105 11/27/23 1633 11/27/23 2122 11/28/23 0607  GLUCAP 178* 137* 147* 187* 188*    Iron Studies: No results for input(s): "IRON", "TIBC", "TRANSFERRIN", "FERRITIN" in the last 72 hours. Studies/Results: No results found.   Medications: Infusions:  ampicillin-sulbactam (UNASYN) IV Stopped (11/28/23 0100)   feeding supplement (OSMOLITE 1.5 CAL) 55 mL/hr at 11/28/23 0233   levETIRAcetam Stopped (11/27/23 2215)    Scheduled Medications:  amLODipine  5 mg Oral Daily   budesonide (PULMICORT) nebulizer solution  0.25 mg Nebulization BID   Chlorhexidine Gluconate Cloth  6 each Topical Daily   clopidogrel  75 mg Per Tube Daily   cycloSPORINE  125 mg Per Tube BID   [START ON 12/01/2023] darbepoetin (ARANESP) injection - NON-DIALYSIS  60 mcg Subcutaneous Q Tue-1800   feeding supplement  237 mL Per Tube BID BM   finasteride  5 mg Oral QPM   free water  250 mL Per Tube Q4H   heparin  5,000 Units Subcutaneous Q8H   insulin aspart  0-15 Units Subcutaneous TID WC   ipratropium-albuterol  3 mL Nebulization BID   multivitamin  1 tablet Per Tube QHS   nystatin  5 mL Oral QID   rosuvastatin  5 mg Per Tube QPM   tamsulosin  0.4 mg Oral Daily    have reviewed scheduled and prn medications.  Physical Exam: General:NAD, comfortable Heart:RRR, s1s2 nl Lungs:clear b/l, no crackle Abdomen:soft, Non-tender, non-distended Extremities:No edema Neurology: Alert, awake and following commands.  Jamarkus [place] Prasad Ulysses Alper 11/28/2023,9:16 AM  LOS: 7 days

## 2023-11-28 NOTE — Progress Notes (Signed)
 VASCULAR LAB    Bilateral lower extremity venous duplex has been performed.  See CV proc for preliminary results.   Maui Ahart, RVT 11/28/2023, 3:06 PM

## 2023-11-28 NOTE — Progress Notes (Signed)
 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  Patient was admitted with hypotension (blood pressure 70/50 mmHg), shortness of breath, lethargy, pulmonary vascular congestion, bibasilar atelectasis, mild worsening of renal function (baseline CKD 3B/3a), elevated cardiac BNP (231), CO2 of 14, and mild leukocytosis (11.1) with left shift.  H&P note documents possible pneumonia with loculated pleural effusion.  Patient underwent ultrasound-guided right thoracentesis on 11/21/2023 that yielded 400 mL of amber versus blood-tinged fluid.  Pleural fluid culture has not grown any organism to date.  Urine culture has grown Enterococcus faecalis.  IV vancomycin has been discontinued.  IV cefepime has been changed to IV Unasyn.  Tmax of 100.8 noted overnight.  Patient was significantly volume overloaded on presentation.  Echocardiogram revealed diastolic dysfunction.  Started on IV Lasix 100 Mg 3 times daily.  Lasix has been decreased to 80 Mg daily from today, due to hyponatremia.  Nephrology team is managing patient's volume overload and diuretics.  Nephrology to advise if to hold Myfortic.     11/23/2023: Patient seen alongside patient's wife.  Patient is more lethargic today.  Sodium is 150.  Diuretics have been decreased significantly.  Discussed gentle hydration with D5W with the nephrology team.  As documented above, vancomycin has been discontinued.  IV cefepime has been changed to IV Unasyn.  Assessment and Plan: Acute on chronic hypoxic resp failure  due to acute on chronic diastolic CHF - Lasix per nephro discretion  - Crestor 5 mg PO at bedtime  - Plavix 75 mg PO daily    CAP  - Pulmicort bid - Duoneb bid    AKI of cadaveric transplanted kidney 3b & liver transplant  - Cyclosporine 125 mg bid    UTI due to e.faecalis  - IV unasyn 3g q12 - Finasteride 5 mg PO at bedtime   - Flomax 0.4 mg PO daily    Metabolic acidosis  - Nephro following  - Lasix PRN   Acute metabolic encephalopathy  - Complicating care    7. Seizure  - IV keppra 500 mg bid    8. HTN - Norvasc 5 mg PO daily   Subjective: Pt seen and examined at the bedside.Today is the most awake I have seen the pt during the entire admission.  CRP is downtrending appropriately (15 -->6).  Appreciate nephrology following along. Na+ also improving (151) along with overall kidney function.  Physical Exam: Vitals:   11/28/23 0717 11/28/23 0719 11/28/23 0748 11/28/23 1109  BP:   (!) 148/65 134/76  Pulse:   80   Resp:   18   Temp:   99.6 F (37.6 C) 98.3 F (36.8 C)  TempSrc:   Oral Oral  SpO2: 100% 100% 99%   Weight:      Height:       Constitutional:      Comments: Awake HENT:     Head: Normocephalic.  Cardiovascular:     Rate and Rhythm: Normal rate.  Pulmonary:     Effort: Pulmonary effort is normal.  Abdominal:     Palpations: Abdomen is soft.  Musculoskeletal:     Cervical back: Neck  supple.  Skin:    General: Skin is warm.  Neurological:     Comments: Awake and alert  Psychiatric:     Comments: Mood neutral      Disposition: Status is: Inpatient Remains inpatient appropriate because: IV antibx and serial labs   Planned Discharge Destination: Home    Time spent: 35 minutes  Author: Baron Hamper , MD 11/28/2023 11:59 AM  For on call review [email or web address].

## 2023-11-29 DIAGNOSIS — J189 Pneumonia, unspecified organism: Secondary | ICD-10-CM | POA: Diagnosis not present

## 2023-11-29 LAB — BASIC METABOLIC PANEL WITH GFR
Anion gap: 7 (ref 5–15)
BUN: 45 mg/dL — ABNORMAL HIGH (ref 8–23)
CO2: 17 mmol/L — ABNORMAL LOW (ref 22–32)
Calcium: 9.4 mg/dL (ref 8.9–10.3)
Chloride: 124 mmol/L — ABNORMAL HIGH (ref 98–111)
Creatinine, Ser: 1.9 mg/dL — ABNORMAL HIGH (ref 0.61–1.24)
GFR, Estimated: 36 mL/min — ABNORMAL LOW (ref >60)
Glucose, Bld: 250 mg/dL — ABNORMAL HIGH (ref 70–99)
Potassium: 4.9 mmol/L (ref 3.5–5.1)
Sodium: 148 mmol/L — ABNORMAL HIGH (ref 135–145)

## 2023-11-29 LAB — GLUCOSE, CAPILLARY
Glucose-Capillary: 203 mg/dL — ABNORMAL HIGH (ref 70–99)
Glucose-Capillary: 169 mg/dL — ABNORMAL HIGH (ref 70–99)
Glucose-Capillary: 182 mg/dL — ABNORMAL HIGH (ref 70–99)
Glucose-Capillary: 173 mg/dL — ABNORMAL HIGH (ref 70–99)

## 2023-11-29 MED ORDER — SODIUM CHLORIDE 0.9 % IV SOLN
3.0000 g | Freq: Two times a day (BID) | INTRAVENOUS | Status: DC
  Administered 2023-11-30 (×2): 3 g via INTRAVENOUS
  Filled 2023-11-29 (×2): qty 8

## 2023-11-29 MED ORDER — APIXABAN 5 MG PO TABS: 5.0000 mg | ORAL_TABLET | Freq: Two times a day (BID) | ORAL | Status: DC

## 2023-11-29 MED ORDER — APIXABAN 5 MG PO TABS
10.0000 mg | ORAL_TABLET | Freq: Two times a day (BID) | ORAL | Status: DC
  Administered 2023-11-29 – 2023-12-03 (×9): 10 mg via ORAL
  Filled 2023-11-29 (×9): qty 2

## 2023-11-29 MED ORDER — SODIUM CHLORIDE 0.9 % IV SOLN: 3.0000 g | Freq: Three times a day (TID) | INTRAVENOUS | Status: DC

## 2023-11-29 MED ORDER — IPRATROPIUM-ALBUTEROL 0.5-2.5 (3) MG/3ML IN SOLN: 3.0000 mL | Freq: Four times a day (QID) | RESPIRATORY_TRACT | Status: DC | PRN

## 2023-11-29 MED ORDER — GERHARDT'S BUTT CREAM
TOPICAL_CREAM | Freq: Two times a day (BID) | CUTANEOUS | Status: DC
  Filled 2023-11-29: qty 60

## 2023-11-29 NOTE — Discharge Instructions (Addendum)
 Information on my medicine - ELIQUIS (apixaban)   Why was Eliquis prescribed for you? Eliquis was prescribed to treat blood clots that may have been found in the veins of your legs (deep vein thrombosis) or in your lungs (pulmonary embolism) and to reduce the risk of them occurring again.  What do You need to know about Eliquis ? The starting dose is 10 mg (two 5 mg tablets) taken TWICE daily for the FIRST SEVEN (7) DAYS, then on (enter date)  12/06/2023  the dose is reduced to ONE 5 mg tablet taken TWICE daily.  Eliquis may be taken with or without food.   Try to take the dose about the same time in the morning and in the evening. If you have difficulty swallowing the tablet whole please discuss with your pharmacist how to take the medication safely.  Take Eliquis exactly as prescribed and DO NOT stop taking Eliquis without talking to the doctor who prescribed the medication.  Stopping may increase your risk of developing a new blood clot.  Refill your prescription before you run out.  After discharge, you should have regular check-up appointments with your healthcare provider that is prescribing your Eliquis.    What do you do if you miss a dose? If a dose of ELIQUIS is not taken at the scheduled time, take it as soon as possible on the same day and twice-daily administration should be resumed. The dose should not be doubled to make up for a missed dose.  Important Safety Information A possible side effect of Eliquis is bleeding. You should call your healthcare provider right away if you experience any of the following: Bleeding from an injury or your nose that does not stop. Unusual colored urine (red or dark brown) or unusual colored stools (red or black). Unusual bruising for unknown reasons. A serious fall or if you hit your head (even if there is no bleeding).  Some medicines may interact with Eliquis and might increase your risk of bleeding or clotting while on Eliquis. To  help avoid this, consult your healthcare provider or pharmacist prior to using any new prescription or non-prescription medications, including herbals, vitamins, non-steroidal anti-inflammatory drugs (NSAIDs) and supplements.  This website has more information on Eliquis (apixaban): http://www.eliquis.com/eliquis/home

## 2023-11-29 NOTE — Progress Notes (Signed)
 [facility] NEPHROLOGY PROGRESS NOTE  Assessment/ Plan: Pt is a 78 y.o. 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 Creat here 2.5 on admission.  H/o renal transplant 12/13/14. Pt was vol overloaded w/ diffuse UE/ LE edema and increase work of breathing initially.   - lasix intermittently as needed - defer today because of mild hyponatremia. - foley catheter was ordered for hx of urinary retention requiring BID in/out cath at home for the 3 weeks preceding admission  -Creatinine is stable at around baseline, monitor lab, strict ins and outs.  Urine output is increasing.  # Hypernatremia - free water deficit. off bicarb for now.  Continue free water from the tube.  Diuretics on hold.  # Metabolic acidosis -  off of bicarb for now given hypernatremia.  Add oral sodium bicarbonate.  #H/o liver transplant - at same time as renal transplant in 2014-12-13.   #Deceased donor renal transplant 12-13-14 - with AKI on CKD of allograft as noted.  - continue cyclosporine  - he has a UTI so we are currently holding myfortic and will need to resume once antibiotics are complete.  # Fluid volume overload - diffuse LE/ UE edema. Now improved. ECHO done, normal LVEF.     # HTN - holding losartan for now w/ AKI.  Resume Amlodipine as blood pressure started going up.  #Anemia of CKD - on ESA and iron outpatient.  His last iron infusions were on 3/3 and 3/10 and his last EPO was 3/9. S/p aranesp 60 mcg once on 3/25 and weekly aranesp 60 mcg weekly.     H/o CVA - w/ hemiparesis.  Discussed with patient's brother at the bedside and his sister on the phone.  Subjective: Seen and examined at bedside.  1.3 L urine output.  No nausea, vomiting, chest pain or shortness of breath.  His brother was presented with him. Objective Vital signs in last 24 hours: Vitals:   11/28/23 2255 11/29/23 0305 11/29/23 0400 11/29/23 0753  BP: (!) 140/75 (!) 149/66  117/69   Pulse: 82 83    Resp: (!) 21 20    Temp: (!) 100.6 F (38.1 C) (!) 100.5 F (38.1 C)  98.1 F (36.7 C)  TempSrc: Axillary Axillary  Oral  SpO2: 95% 97%    Weight:   87 kg   Height:       Weight change: -1.8 kg  Intake/Output Summary (Last 24 hours) at 11/29/2023 0912 Last data filed at 11/29/2023 0358 Gross per 24 hour  Intake 5553.09 ml  Output 1375 ml  Net 4178.09 ml       Labs: RENAL PANEL Recent Labs  Lab 11/23/23 0227 11/23/23 1128 11/23/23 1129 11/24/23 0229 11/24/23 12/13/2027 11/26/23 0235 11/26/23 1322 11/27/23 0221 11/28/23 0225 11/29/23 0213  NA 150* 151*  --  151*   < > 155* 154* 154* 151* 148*  K 3.9 4.5  --  3.8   < > 3.7 3.9 4.2 4.3 4.9  CL 124* 125*  --  123*   < > 129* 127* 130* 126* 124*  CO2 17* 15*  --  16*   < > 20* 17* 19* 17* 17*  GLUCOSE 174* 164*  --  222*   < > 169* 133* 145* 205* 250*  BUN 67* 73*  --  67*   < > 57* 52* 52* 44* 45*  CREATININE 2.58* 2.51*  --  2.73*   < > 2.37* 2.36* 2.21* 1.91* 1.90*  CALCIUM 10.4* 9.9  --  10.0   < > 9.9 9.9 9.8 9.5 9.4  PHOS >30.0* 3.1 3.2 3.0  --   --   --   --   --   --   ALBUMIN 2.4* 2.4*  --  2.2*  --   --   --   --   --   --    < > = values in this interval not displayed.    Liver Function Tests: Recent Labs  Lab 11/23/23 0227 11/23/23 1128 11/24/23 0229  ALBUMIN 2.4* 2.4* 2.2*   No results for input(s): "LIPASE", "AMYLASE" in the last 168 hours. No results for input(s): "AMMONIA" in the last 168 hours. CBC: Recent Labs    09/29/23 0632 09/30/23 0734 11/20/23 2057 11/21/23 0515 11/22/23 0213 11/23/23 0227 11/26/23 0235  HGB 8.7*   < > 9.2* 9.3* 8.7* 8.5* 7.2*  MCV 75.8*   < > 77.1* 77.9* 76.3* 75.4* 78.0*  VITAMINB12 380  --   --   --   --   --   --   FOLATE 13.8  --   --   --   --   --   --   TIBC 176*  --   --   --   --   --   --   IRON 7*  --   --   --   --   --   --    < > = values in this interval not displayed.    Cardiac Enzymes: No results for input(s): "CKTOTAL",  "CKMB", "CKMBINDEX", "TROPONINI" in the last 168 hours. CBG: Recent Labs  Lab 11/28/23 0607 11/28/23 1111 11/28/23 1605 11/28/23 2106 11/29/23 0559  GLUCAP 188* 175* 180* 179* 203*    Iron Studies: No results for input(s): "IRON", "TIBC", "TRANSFERRIN", "FERRITIN" in the last 72 hours. Studies/Results: VAS [place] LOWER EXTREMITY VENOUS (DVT) Result Date: 11/28/2023  Lower Venous DVT Study Patient Name:  Gilbert Wilson  Date of Exam:   11/28/2023 Medical Rec #: [id number]        Accession #:    [id number] Date of Birth: [birth year]        Patient Gender: M Patient Age:   39 years Exam Location:  [facility] Procedure:      VAS [place] LOWER EXTREMITY VENOUS (DVT) Referring Phys: Baron Hamper --------------------------------------------------------------------------------  Indications: Edema.  Comparison Study: No prior study on file Performing Technologist: Sherren Kerns RVS  Examination Guidelines: A complete evaluation includes B-mode imaging, spectral Doppler, color Doppler, and power Doppler as needed of all accessible portions of each vessel. Bilateral testing is considered an integral part of a complete examination. Limited examinations for reoccurring indications may be performed as noted. The reflux portion of the exam is performed with the patient in reverse Trendelenburg.  +--------+---------------+---------+-----------+----------------+-------------+ RIGHT   CompressibilityPhasicitySpontaneityProperties      Thrombus                                                                 Aging         +--------+---------------+---------+-----------+----------------+-------------+ CFV     Full           Yes  No         pulsatile                                                                waveforms                     +--------+---------------+---------+-----------+----------------+-------------+ SFJ     Full                                                              +--------+---------------+---------+-----------+----------------+-------------+ FV Prox Full                                                             +--------+---------------+---------+-----------+----------------+-------------+ FV Mid  Full                                                             +--------+---------------+---------+-----------+----------------+-------------+ FV      Full           Yes      No         pulsatile                     Distal                                     waveforms                     +--------+---------------+---------+-----------+----------------+-------------+ PFV     Full                                                             +--------+---------------+---------+-----------+----------------+-------------+ POP     Full           Yes      No         pulsatile                                                                waveforms                     +--------+---------------+---------+-----------+----------------+-------------+ PTV     Full                                                             +--------+---------------+---------+-----------+----------------+-------------+  PERO    Full                                                             +--------+---------------+---------+-----------+----------------+-------------+   +---------+---------------+---------+-----------+---------------+--------------+ LEFT     CompressibilityPhasicitySpontaneityProperties     Thrombus Aging +---------+---------------+---------+-----------+---------------+--------------+ CFV      Partial        Yes      No         pulsatile      Age                                                        waveforms      Indeterminate  +---------+---------------+---------+-----------+---------------+--------------+ SFJ      Full                                                              +---------+---------------+---------+-----------+---------------+--------------+ FV Prox  Partial        Yes      No         pulsatile      Chronic                                                    waveforms                     +---------+---------------+---------+-----------+---------------+--------------+ FV Mid   Full           Yes      No         pulsatile                                                                 waveforms                     +---------+---------------+---------+-----------+---------------+--------------+ FV DistalFull           Yes      No         pulsatile                                                                 waveforms                     +---------+---------------+---------+-----------+---------------+--------------+ PFV      Partial  No       No                        Chronic        +---------+---------------+---------+-----------+---------------+--------------+ POP      Full           Yes      No         pulsatile                                                                 waveforms                     +---------+---------------+---------+-----------+---------------+--------------+ PTV                                                        patent by                                                                 color          +---------+---------------+---------+-----------+---------------+--------------+ PERO                                                       patent by                                                                 color          +---------+---------------+---------+-----------+---------------+--------------+ EIV                     Yes      No                        pulsatile                                                                 waveform       +---------+---------------+---------+-----------+---------------+--------------+ CIV                      Yes      No  pulsatile      distal                                                     waveform       +---------+---------------+---------+-----------+---------------+--------------+    Summary: RIGHT: - No evidence of deep vein thrombosis in the lower extremity. No indirect evidence of obstruction proximal to the inguinal ligament.  - No cystic structure found in the popliteal fossa. Pulsatile waveforms  LEFT: - Findings consistent with age indeterminate vs. acute on chronic deep vein thrombosis involving the left common femoral vein.  - Findings consistent with chronic deep vein thrombosis involving the left femoral vein, and left proximal profunda vein.  - No cystic structure found in the popliteal fossa. Pulsatile waveform.  *See table(s) above for measurements and observations.    Preliminary      Medications: Infusions:  ampicillin-sulbactam (UNASYN) IV 3 g (11/28/23 2323)   feeding supplement (OSMOLITE 1.5 CAL) 55 mL/hr at 11/29/23 0155   levETIRAcetam Stopped (11/28/23 2150)    Scheduled Medications:  amLODipine  5 mg Oral Daily   budesonide (PULMICORT) nebulizer solution  0.25 mg Nebulization BID   Chlorhexidine Gluconate Cloth  6 each Topical Daily   clopidogrel  75 mg Per Tube Daily   cycloSPORINE  125 mg Per Tube BID   [START ON 12/01/2023] darbepoetin (ARANESP) injection - NON-DIALYSIS  60 mcg Subcutaneous Q Tue-1800   feeding supplement  237 mL Oral BID BM   finasteride  5 mg Oral QPM   free water  250 mL Per Tube Q4H   Gerhardt's butt cream   Topical BID   heparin  5,000 Units Subcutaneous Q8H   insulin aspart  0-15 Units Subcutaneous TID WC   multivitamin  1 tablet Per Tube QHS   nystatin  5 mL Oral QID   rosuvastatin  5 mg Per Tube QPM    have reviewed scheduled and prn medications.  Physical Exam: General:NAD, comfortable Heart:RRR, s1s2 nl Lungs:clear b/l, no crackle Abdomen:soft, Non-tender,  non-distended Extremities:No edema Neurology: Alert, awake and following commands.  [place] Gayden Prasad Emmalou Hunger 11/29/2023,9:12 AM  LOS: 8 days

## 2023-11-29 NOTE — Progress Notes (Signed)
 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  Patient was admitted with hypotension (blood pressure 70/50 mmHg), shortness of breath, lethargy, pulmonary vascular congestion, bibasilar atelectasis, mild worsening of renal function (baseline CKD 3B/3a), elevated cardiac BNP (231), CO2 of 14, and mild leukocytosis (11.1) with left shift.  H&P note documents possible pneumonia with loculated pleural effusion.  Patient underwent ultrasound-guided right thoracentesis on 11/21/2023 that yielded 400 mL of amber versus blood-tinged fluid.  Pleural fluid culture has not grown any organism to date.  Urine culture has grown Enterococcus faecalis.  IV vancomycin has been discontinued.  IV cefepime has been changed to IV Unasyn.  Tmax of 100.8 noted overnight.  Patient was significantly volume overloaded on presentation.  Echocardiogram revealed diastolic dysfunction.  Started on IV Lasix 100 Mg 3 times daily.  Lasix has been decreased to 80 Mg daily from today, due to hyponatremia.  Nephrology team is managing patient's volume overload and diuretics.  Nephrology to advise if to hold Myfortic.     11/23/2023: Patient seen alongside patient's wife.  Patient is more lethargic today.  Sodium is 150.  Diuretics have been decreased significantly.  Discussed gentle hydration with D5W with the nephrology team.  As documented above, vancomycin has been discontinued.  IV cefepime has been changed to IV Unasyn.  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 Seizure  - IV keppra 500 mg bid    8. HTN - Norvasc 5 mg PO daily   9. L leg DVT - Eliquis 10 mg PO bid for 7 days then 5 mg PO bid   Subjective: Pt seen and examined at the bedside. Labs overall improved. Doppler U/S of the lower leg revealed a L leg DVT. Pt started on eliquis today.  Physical Exam: Vitals:   11/29/23 0400 11/29/23 0753 11/29/23 0939 11/29/23 1111  BP:  117/69 117/69 134/60  Pulse:      Resp:      Temp:  98.1 F (36.7 C)  99.2 F (37.3 C)  TempSrc:  Oral  Oral  SpO2:      Weight: 87 kg     Height:       Constitutional:      Comments: Awake HENT:     Head: Normocephalic.  Cardiovascular:     Rate and Rhythm: Normal rate.  Pulmonary:     Effort: Pulmonary effort is normal.  Abdominal:     Palpations: Abdomen is soft.  Musculoskeletal:     Cervical back: Neck supple.  Skin:    General: Skin is warm.  Neurological:     Comments: Awake and alert  Psychiatric:     Comments: Mood neutral   Disposition: Status is: Inpatient Remains inpatient appropriate because: IV antibx and serial labs  Planned Discharge Destination: Home    Time spent: 35 minutes  Author: Baron Hamper , MD 11/29/2023 11:14 AM  For on call review [email or web address].

## 2023-11-29 NOTE — Plan of Care (Signed)
  Problem: Skin Integrity: Goal: Risk for impaired skin integrity will decrease Outcome: Progressing   Problem: Clinical Measurements: Goal: Respiratory complications will improve Outcome: Progressing   

## 2023-11-29 NOTE — Plan of Care (Signed)
  Problem: Education: Goal: Ability to describe self-care measures that may prevent or decrease complications (Diabetes Survival Skills Education) will improve Outcome: Progressing Goal: Individualized Educational Video(s) Outcome: Progressing   Problem: Coping: Goal: Ability to adjust to condition or change in health will improve Outcome: Progressing   Problem: Fluid Volume: Goal: Ability to maintain a balanced intake and output will improve Outcome: Progressing   Problem: Health Behavior/Discharge Planning: Goal: Ability to identify and utilize available resources and services will improve Outcome: Progressing Goal: Ability to manage health-related needs will improve Outcome: Progressing   Problem: Metabolic: Goal: Ability to maintain appropriate glucose levels will improve Outcome: Progressing   Problem: Nutritional: Goal: Maintenance of adequate nutrition will improve Outcome: Progressing Goal: Progress toward achieving an optimal weight will improve Outcome: Progressing   Problem: Tissue Perfusion: Goal: Adequacy of tissue perfusion will improve Outcome: Progressing   Problem: Education: Goal: Knowledge of General Education information will improve Description: Including pain rating scale, medication(s)/side effects and non-pharmacologic comfort measures Outcome: Progressing   Problem: Health Behavior/Discharge Planning: Goal: Ability to manage health-related needs will improve Outcome: Progressing   Problem: Clinical Measurements: Goal: Ability to maintain clinical measurements within normal limits will improve Outcome: Progressing Goal: Will remain free from infection Outcome: Progressing Goal: Diagnostic test results will improve Outcome: Progressing Goal: Respiratory complications will improve Outcome: Progressing Goal: Cardiovascular complication will be avoided Outcome: Progressing   Problem: Activity: Goal: Risk for activity intolerance will  decrease Outcome: Progressing   Problem: Nutrition: Goal: Adequate nutrition will be maintained Outcome: Progressing   Problem: Coping: Goal: Level of anxiety will decrease Outcome: Progressing

## 2023-11-30 ENCOUNTER — Inpatient Hospital Stay (HOSPITAL_COMMUNITY)

## 2023-11-30 DIAGNOSIS — E43 Unspecified severe protein-calorie malnutrition: Secondary | ICD-10-CM | POA: Insufficient documentation

## 2023-11-30 DIAGNOSIS — J189 Pneumonia, unspecified organism: Secondary | ICD-10-CM | POA: Diagnosis not present

## 2023-11-30 LAB — HEMOGLOBIN AND HEMATOCRIT, BLOOD
HCT: 24.2 % — ABNORMAL LOW (ref 39.0–52.0)
Hemoglobin: 7.3 g/dL — ABNORMAL LOW (ref 13.0–17.0)

## 2023-11-30 LAB — BASIC METABOLIC PANEL WITH GFR
Anion gap: 8 (ref 5–15)
BUN: 46 mg/dL — ABNORMAL HIGH (ref 8–23)
CO2: 17 mmol/L — ABNORMAL LOW (ref 22–32)
Calcium: 9.4 mg/dL (ref 8.9–10.3)
Chloride: 121 mmol/L — ABNORMAL HIGH (ref 98–111)
Creatinine, Ser: 1.73 mg/dL — ABNORMAL HIGH (ref 0.61–1.24)
GFR, Estimated: 40 mL/min — ABNORMAL LOW (ref >60)
Glucose, Bld: 189 mg/dL — ABNORMAL HIGH (ref 70–99)
Potassium: 5.3 mmol/L — ABNORMAL HIGH (ref 3.5–5.1)
Sodium: 146 mmol/L — ABNORMAL HIGH (ref 135–145)

## 2023-11-30 LAB — GLUCOSE, CAPILLARY
Glucose-Capillary: 188 mg/dL — ABNORMAL HIGH (ref 70–99)
Glucose-Capillary: 176 mg/dL — ABNORMAL HIGH (ref 70–99)
Glucose-Capillary: 222 mg/dL — ABNORMAL HIGH (ref 70–99)

## 2023-11-30 MED ORDER — SODIUM ZIRCONIUM CYCLOSILICATE 5 G PO PACK
5.0000 g | PACK | Freq: Once | ORAL | Status: AC
  Administered 2023-11-30: 5 g via ORAL
  Filled 2023-11-30: qty 1

## 2023-11-30 MED ORDER — SODIUM CHLORIDE 0.9 % IV SOLN
3.0000 g | Freq: Three times a day (TID) | INTRAVENOUS | Status: AC
  Administered 2023-11-30: 3 g via INTRAVENOUS
  Filled 2023-11-30: qty 8

## 2023-11-30 MED ORDER — SODIUM CHLORIDE 0.9 % IV SOLN: 3.0000 g | Freq: Three times a day (TID) | INTRAVENOUS | Status: DC

## 2023-11-30 MED ORDER — MYCOPHENOLATE 200 MG/ML ORAL SUSPENSION
250.0000 mg | Freq: Two times a day (BID) | ORAL | Status: DC
  Administered 2023-12-01 – 2023-12-03 (×5): 250 mg via ORAL
  Filled 2023-11-30 (×6): qty 1.25

## 2023-11-30 NOTE — Plan of Care (Signed)

## 2023-11-30 NOTE — Progress Notes (Addendum)
 [facility] NEPHROLOGY PROGRESS NOTE  Assessment/ Plan: Pt is a 78 y.o. 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 Creat here 2.5 on admission.  H/o renal transplant 2014-12-07. Pt was vol overloaded w/ diffuse UE/ LE edema and increase work of breathing initially.   - lasix intermittently as needed - good UOP, no need to redose today.  - foley catheter was ordered for hx of urinary retention requiring BID in/out cath at home for the 3 weeks preceding admission  -Creatinine is stable at around baseline, monitor lab, strict ins and outs.  Urine output is increasing.  # Hypernatremia - free water deficit. off bicarb for now.  Continue free water from the tube.  Diuretics on hold.  Will f/u on AM lab and adjust PRN.   # Metabolic acidosis -  off of bicarb for now given hypernatremia.  Cont. oral sodium bicarbonate.  #H/o liver transplant - at same time as renal transplant in December 07, 2014.   #Deceased donor renal transplant 07-Dec-2014 - with AKI on CKD of allograft as noted.  - continue cyclosporine  - he has a UTI so we are currently holding myfortic and will need to resume once antibiotics are complete. (Plan tomorrow)  # Acute on chronic HFpEF -  Was hypervolemic, now improved overall - though unilateral pedal edema today, watch for now. ECHO done, normal LVEF.     # HTN - holding losartan for now w/ AKI.  Bp acceptable currently, cont amlodipine.   #Anemia of CKD - on ESA and iron outpatient.  His last iron infusions were on 3/3 and 3/10 and his last EPO was 3/9. S/p aranesp 60 mcg once on 3/25 and weekly aranesp 60 mcg weekly.     # H/o CVA - w/ hemiparesis.  #DVT - was started on apixiban  Will continue to follow, contact with concerns.   ADDENDUM:  AM labs rev'd Na trending down 146 - cont same free water dose, K 5.3 - give loklema 5g dose.  BUN 46 stable, Cr 1.73 from 1.9.  CXR low lung volumes, vasc congestion vs atelectasis. On RA.   He has good UOP and was net neg 0.3L yesterday.  I will hold on giving lasix but if clinical status changes through the day certainly would be reasonable to give a 1 time dose.    Subjective: Seen and examined at bedside.  2 L urine output.  Daughter bedside.  Says some PO intake but not robust, remains on TF via NGT.  Started apixiban.   Objective Vital signs in last 24 hours: Vitals:   11/29/23 1941 11/29/23 2359 11/30/23 0541 11/30/23 0759  BP: (!) 141/61 (!) 141/61 (!) 149/64 131/61  Pulse: 66 82 70 70  Resp: 20 20 (!) 25 (!) 25  Temp: 98.1 F (36.7 C) 99.1 F (37.3 C) 99.9 F (37.7 C) 99.5 F (37.5 C)  TempSrc: Oral Axillary Oral Oral  SpO2: 96% 94% 95% 93%  Weight:   89 kg   Height:       Weight change: 2 kg  Intake/Output Summary (Last 24 hours) at 11/30/2023 0803 Last data filed at 11/30/2023 0601 Gross per 24 hour  Intake 1781.33 ml  Output 07-Dec-2063 ml  Net -283.67 ml       Labs: RENAL PANEL Recent Labs  Lab 11/23/23 1128 11/23/23 1129 11/24/23 0229 11/24/23 12/07/27 11/26/23 0235 11/26/23 1322 11/27/23 0221 11/28/23 0225  11/29/23 0213  NA 151*  --  151*   < > 155* 154* 154* 151* 148*  K 4.5  --  3.8   < > 3.7 3.9 4.2 4.3 4.9  CL 125*  --  123*   < > 129* 127* 130* 126* 124*  CO2 15*  --  16*   < > 20* 17* 19* 17* 17*  GLUCOSE 164*  --  222*   < > 169* 133* 145* 205* 250*  BUN 73*  --  67*   < > 57* 52* 52* 44* 45*  CREATININE 2.51*  --  2.73*   < > 2.37* 2.36* 2.21* 1.91* 1.90*  CALCIUM 9.9  --  10.0   < > 9.9 9.9 9.8 9.5 9.4  PHOS 3.1 3.2 3.0  --   --   --   --   --   --   ALBUMIN 2.4*  --  2.2*  --   --   --   --   --   --    < > = values in this interval not displayed.    Liver Function Tests: Recent Labs  Lab 11/23/23 1128 11/24/23 0229  ALBUMIN 2.4* 2.2*   No results for input(s): "LIPASE", "AMYLASE" in the last 168 hours. No results for input(s): "AMMONIA" in the last 168 hours. CBC: Recent Labs    09/29/23 0632 09/30/23 0734  11/20/23 2057 11/21/23 0515 11/22/23 0213 11/23/23 0227 11/26/23 0235  HGB 8.7*   < > 9.2* 9.3* 8.7* 8.5* 7.2*  MCV 75.8*   < > 77.1* 77.9* 76.3* 75.4* 78.0*  VITAMINB12 380  --   --   --   --   --   --   FOLATE 13.8  --   --   --   --   --   --   TIBC 176*  --   --   --   --   --   --   IRON 7*  --   --   --   --   --   --    < > = values in this interval not displayed.    Cardiac Enzymes: No results for input(s): "CKTOTAL", "CKMB", "CKMBINDEX", "TROPONINI" in the last 168 hours. CBG: Recent Labs  Lab 11/29/23 0559 11/29/23 1113 11/29/23 1622 11/29/23 2057 11/30/23 0636  GLUCAP 203* 169* 182* 173* 188*    Iron Studies: No results for input(s): "IRON", "TIBC", "TRANSFERRIN", "FERRITIN" in the last 72 hours. Studies/Results: VAS [place] LOWER EXTREMITY VENOUS (DVT) Result Date: 11/29/2023  Lower Venous DVT Study Patient Name:  Gilbert Wilson  Date of Exam:   11/28/2023 Medical Rec #: [id number]        Accession #:    [id number] Date of Birth: 02/04/1946        Patient Gender: M Patient Age:   73 years Exam Location:  [facility] Procedure:      VAS [place] LOWER EXTREMITY VENOUS (DVT) Referring Phys: Baron Hamper --------------------------------------------------------------------------------  Indications: Edema.  Comparison Study: No prior study on file Performing Technologist: Sherren Kerns RVS  Examination Guidelines: A complete evaluation includes B-mode imaging, spectral Doppler, color Doppler, and power Doppler as needed of all accessible portions of each vessel. Bilateral testing is considered an integral part of a complete examination. Limited examinations for reoccurring indications may be performed as noted. The reflux portion of the exam is performed with the patient in reverse Trendelenburg.  +--------+---------------+---------+-----------+----------------+-------------+ RIGHT   CompressibilityPhasicitySpontaneityProperties  Thrombus                                                                  Aging         +--------+---------------+---------+-----------+----------------+-------------+ CFV     Full           Yes      No         pulsatile                                                                waveforms                     +--------+---------------+---------+-----------+----------------+-------------+ SFJ     Full                                                             +--------+---------------+---------+-----------+----------------+-------------+ FV Prox Full                                                             +--------+---------------+---------+-----------+----------------+-------------+ FV Mid  Full                                                             +--------+---------------+---------+-----------+----------------+-------------+ FV      Full           Yes      No         pulsatile                     Distal                                     waveforms                     +--------+---------------+---------+-----------+----------------+-------------+ PFV     Full                                                             +--------+---------------+---------+-----------+----------------+-------------+ POP     Full           Yes      No         pulsatile  waveforms                     +--------+---------------+---------+-----------+----------------+-------------+ PTV     Full                                                             +--------+---------------+---------+-----------+----------------+-------------+ PERO    Full                                                             +--------+---------------+---------+-----------+----------------+-------------+   +---------+---------------+---------+-----------+---------------+--------------+ LEFT      CompressibilityPhasicitySpontaneityProperties     Thrombus Aging +---------+---------------+---------+-----------+---------------+--------------+ CFV      Partial        Yes      No         pulsatile      Age                                                        waveforms      Indeterminate  +---------+---------------+---------+-----------+---------------+--------------+ SFJ      Full                                                             +---------+---------------+---------+-----------+---------------+--------------+ FV Prox  Partial        Yes      No         pulsatile      Chronic                                                    waveforms                     +---------+---------------+---------+-----------+---------------+--------------+ FV Mid   Full           Yes      No         pulsatile                                                                 waveforms                     +---------+---------------+---------+-----------+---------------+--------------+ FV DistalFull           Yes      No         pulsatile  waveforms                     +---------+---------------+---------+-----------+---------------+--------------+ PFV      Partial        No       No                        Chronic        +---------+---------------+---------+-----------+---------------+--------------+ POP      Full           Yes      No         pulsatile                                                                 waveforms                     +---------+---------------+---------+-----------+---------------+--------------+ PTV                                                        patent by                                                                 color          +---------+---------------+---------+-----------+---------------+--------------+ PERO                                                        patent by                                                                 color          +---------+---------------+---------+-----------+---------------+--------------+ EIV                     Yes      No                        pulsatile                                                                 waveform       +---------+---------------+---------+-----------+---------------+--------------+ CIV  Yes      No                        pulsatile      distal                                                     waveform       +---------+---------------+---------+-----------+---------------+--------------+     Summary: RIGHT: - No evidence of deep vein thrombosis in the lower extremity. No indirect evidence of obstruction proximal to the inguinal ligament.  - No cystic structure found in the popliteal fossa. Pulsatile waveforms  LEFT: - Findings consistent with age indeterminate vs. acute on chronic deep vein thrombosis involving the left common femoral vein.  - Findings consistent with chronic deep vein thrombosis involving the left femoral vein, and left proximal profunda vein.  - No cystic structure found in the popliteal fossa. Pulsatile waveform.  *See table(s) above for measurements and observations. Electronically signed by Carolynn Sayers on 11/29/2023 at 1:07:06 PM.    Final      Medications: Infusions:  ampicillin-sulbactam (UNASYN) IV Stopped (11/30/23 0040)   feeding supplement (OSMOLITE 1.5 CAL) 55 mL/hr at 11/30/23 0601   levETIRAcetam Stopped (11/29/23 2220)    Scheduled Medications:  amLODipine  5 mg Oral Daily   apixaban  10 mg Oral BID   Followed by   Melene Muller ON 12/06/2023] apixaban  5 mg Oral BID   budesonide (PULMICORT) nebulizer solution  0.25 mg Nebulization BID   Chlorhexidine Gluconate Cloth  6 each Topical Daily   clopidogrel  75 mg Per Tube Daily   cycloSPORINE  125 mg Per Tube  BID   [START ON 12/01/2023] darbepoetin (ARANESP) injection - NON-DIALYSIS  60 mcg Subcutaneous Q Tue-1800   feeding supplement  237 mL Oral BID BM   finasteride  5 mg Oral QPM   free water  250 mL Per Tube Q4H   Gerhardt's butt cream   Topical BID   insulin aspart  0-15 Units Subcutaneous TID WC   multivitamin  1 tablet Per Tube QHS   nystatin  5 mL Oral QID   rosuvastatin  5 mg Per Tube QPM    have reviewed scheduled and prn medications.  Physical Exam: General:NAD, comfortable Heart:RRR, s1s2 nl Lungs:clear b/l, no crackle Abdomen:soft, Non-tender, non-distended  Extremities:2+ RLE ankle edema Neurology: Alert, awake and following commands.  Tyler Pita 11/30/2023,8:03 AM  LOS: 9 days

## 2023-11-30 NOTE — Plan of Care (Signed)
  Problem: Metabolic: Goal: Ability to maintain appropriate glucose levels will improve Outcome: Progressing   Problem: Nutritional: Goal: Maintenance of adequate nutrition will improve Outcome: Progressing Goal: Progress toward achieving an optimal weight will improve Outcome: Progressing   Problem: Nutrition: Goal: Adequate nutrition will be maintained Outcome: Progressing   Problem: Coping: Goal: Level of anxiety will decrease Outcome: Progressing   Problem: Elimination: Goal: Will not experience complications related to bowel motility Outcome: Progressing Goal: Will not experience complications related to urinary retention Outcome: Progressing   Problem: Pain Managment: Goal: General experience of comfort will improve and/or be controlled Outcome: Progressing   Problem: Safety: Goal: Ability to remain free from injury will improve Outcome: Progressing

## 2023-11-30 NOTE — Progress Notes (Signed)
 Nephrology Quick Note:    See full progress note for the day as well.  At the time of assessment this AM pt did not have AM labs and a CXR was also recommended by RT.    Followed up those studies:   AM labs rev'd Na trending down 146 - cont same free water dose, K 5.3 - give loklema 5g dose.  BUN 46 stable, Cr 1.73 from 1.9.  CXR low lung volumes, vasc congestion vs atelectasis. On RA.  He has good UOP and was net neg 0.3L yesterday.  I will hold on giving lasix but if clinical status changes through the day certainly would be reasonable to give a 1 time dose.   Estill Bakes MD [facility] Pager [phone]

## 2023-11-30 NOTE — Progress Notes (Signed)
 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  Patient was admitted with hypotension (blood pressure 70/50 mmHg), shortness of breath, lethargy, pulmonary vascular congestion, bibasilar atelectasis, mild worsening of renal function (baseline CKD 3B/3a), elevated cardiac BNP (231), CO2 of 14, and mild leukocytosis (11.1) with left shift.  H&P note documents possible pneumonia with loculated pleural effusion.  Patient underwent ultrasound-guided right thoracentesis on 11/21/2023 that yielded 400 mL of amber versus blood-tinged fluid.  Pleural fluid culture has not grown any organism to date.  Urine culture has grown Enterococcus faecalis.  IV vancomycin has been discontinued.  IV cefepime has been changed to IV Unasyn.  Tmax of 100.8 noted overnight.  Patient was significantly volume overloaded on presentation.  Echocardiogram revealed diastolic dysfunction.  Started on IV Lasix 100 Mg 3 times daily.  Lasix has been decreased to 80 Mg daily from today, due to hyponatremia.  Nephrology team is managing patient's volume overload and diuretics.  Nephrology holding Myfortic. Vancomycin has been discontinued.  IV cefepime has been changed to IV Unasyn.   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 Seizure  - IV keppra 500 mg bid    8. HTN - Norvasc 5 mg PO daily    9. L leg DVT - Eliquis 10 mg PO bid for 7 days then 5 mg PO bid   Subjective: CXR ordered this morning by nephrology shows vascular congestion. Lasix has been on hold per nephrology. IV antibx nearing completion. Pt tolerating eliquis well with no overt signs of bleeding. Daughter confirms the goal is to take the pt back home once he is discharged.  Physical Exam: Vitals:   11/29/23 2359 11/30/23 0541 11/30/23 0759 11/30/23 0919  BP: (!) 141/61 (!) 149/64 131/61 131/61  Pulse: 82 70 70   Resp: 20 (!) 25 (!) 25   Temp: 99.1 F (37.3 C) 99.9 F (37.7 C) 99.5 F (37.5 C)   TempSrc: Axillary Oral Oral   SpO2: 94% 95% 93%   Weight:  89 kg    Height:       Constitutional:      Comments: Awake HENT:     Head: Normocephalic.  Cardiovascular:     Rate and Rhythm: Normal rate.  Pulmonary:     Effort: Pulmonary effort is normal.  Abdominal:     Palpations: Abdomen is soft.  Musculoskeletal:     Cervical back: Neck supple.  Skin:    General: Skin is warm.  Neurological:     Comments: Awake  and alert  Psychiatric:     Comments: Mood neutral      Disposition: Status is: Inpatient Remains inpatient appropriate because: IV antibx and serial labs   Planned Discharge Destination: Home    Time spent: 35 minutes  Author: Baron Hamper , MD 11/30/2023 11:24 AM  For on call review [email or web address].

## 2023-12-01 DIAGNOSIS — J189 Pneumonia, unspecified organism: Secondary | ICD-10-CM | POA: Diagnosis not present

## 2023-12-01 LAB — CBC
HCT: 23.4 % — ABNORMAL LOW (ref 39.0–52.0)
Hemoglobin: 7.1 g/dL — ABNORMAL LOW (ref 13.0–17.0)
MCH: 23.7 pg — ABNORMAL LOW (ref 26.0–34.0)
MCHC: 30.3 g/dL (ref 30.0–36.0)
MCV: 78 fL — ABNORMAL LOW (ref 80.0–100.0)
Platelets: 311 10*3/uL (ref 150–400)
RBC: 3 MIL/uL — ABNORMAL LOW (ref 4.22–5.81)
RDW: 17.5 % — ABNORMAL HIGH (ref 11.5–15.5)
WBC: 7.8 10*3/uL (ref 4.0–10.5)
nRBC: 0 % (ref 0.0–0.2)

## 2023-12-01 LAB — COMPREHENSIVE METABOLIC PANEL WITH GFR
ALT: 52 U/L — ABNORMAL HIGH (ref 0–44)
AST: 41 U/L (ref 15–41)
Albumin: 2.1 g/dL — ABNORMAL LOW (ref 3.5–5.0)
Alkaline Phosphatase: 68 U/L (ref 38–126)
Anion gap: 7 (ref 5–15)
BUN: 47 mg/dL — ABNORMAL HIGH (ref 8–23)
CO2: 18 mmol/L — ABNORMAL LOW (ref 22–32)
Calcium: 9.3 mg/dL (ref 8.9–10.3)
Chloride: 119 mmol/L — ABNORMAL HIGH (ref 98–111)
Creatinine, Ser: 1.7 mg/dL — ABNORMAL HIGH (ref 0.61–1.24)
GFR, Estimated: 41 mL/min — ABNORMAL LOW (ref >60)
Glucose, Bld: 170 mg/dL — ABNORMAL HIGH (ref 70–99)
Potassium: 5.4 mmol/L — ABNORMAL HIGH (ref 3.5–5.1)
Sodium: 144 mmol/L (ref 135–145)
Total Bilirubin: 0.5 mg/dL (ref 0.0–1.2)
Total Protein: 6.2 g/dL — ABNORMAL LOW (ref 6.5–8.1)

## 2023-12-01 LAB — GLUCOSE, CAPILLARY
Glucose-Capillary: 177 mg/dL — ABNORMAL HIGH (ref 70–99)
Glucose-Capillary: 169 mg/dL — ABNORMAL HIGH (ref 70–99)
Glucose-Capillary: 173 mg/dL — ABNORMAL HIGH (ref 70–99)
Glucose-Capillary: 179 mg/dL — ABNORMAL HIGH (ref 70–99)
Glucose-Capillary: 175 mg/dL — ABNORMAL HIGH (ref 70–99)

## 2023-12-01 LAB — MAGNESIUM: Magnesium: 1.8 mg/dL (ref 1.7–2.4)

## 2023-12-01 MED ORDER — FUROSEMIDE 10 MG/ML IJ SOLN
60.0000 mg | Freq: Once | INTRAMUSCULAR | Status: AC
  Administered 2023-12-01: 60 mg via INTRAVENOUS
  Filled 2023-12-01: qty 6

## 2023-12-01 MED ORDER — INSULIN ASPART 100 UNIT/ML IJ SOLN
0.0000 [IU] | INTRAMUSCULAR | Status: DC
  Administered 2023-12-01 – 2023-12-02 (×6): 3 [IU] via SUBCUTANEOUS
  Administered 2023-12-02: 5 [IU] via SUBCUTANEOUS
  Administered 2023-12-02 (×2): 3 [IU] via SUBCUTANEOUS
  Administered 2023-12-03: 2 [IU] via SUBCUTANEOUS
  Administered 2023-12-03: 3 [IU] via SUBCUTANEOUS
  Administered 2023-12-03: 2 [IU] via SUBCUTANEOUS
  Administered 2023-12-03: 3 [IU] via SUBCUTANEOUS

## 2023-12-01 MED ORDER — SODIUM ZIRCONIUM CYCLOSILICATE 10 G PO PACK
10.0000 g | PACK | Freq: Once | ORAL | Status: AC
  Administered 2023-12-01: 10 g via ORAL
  Filled 2023-12-01: qty 1

## 2023-12-01 MED ORDER — LIDOCAINE HCL URETHRAL/MUCOSAL 2 % EX GEL
1.0000 | Freq: Once | CUTANEOUS | Status: AC
  Administered 2023-12-01: 1 via URETHRAL
  Filled 2023-12-01: qty 6

## 2023-12-01 NOTE — TOC Progression Note (Addendum)
 Transition of Care [facility]) - Progression Note    Patient Details  Name: Gilbert Wilson MRN: [id number] Date of Birth: 1945/12/07  Transition of Care [facility]) CM/SW Contact  Lawerance Sabal, RN Phone Number: 12/01/2023, 12:05 PM  Clinical Narrative:     Sherron Monday w patient's daughter at bedside. Unsure at this time if patient will progress to all PO feeds or need a PEG, he is currently able to PO some and able to drink ensure. We discussed supports at home and her plan is still to DC to home. They hare utilizing 16 of 20 hours from the [place] for aid services that they have and her and her mother are discussing between adding hours to the 4 days they have an aid or adding in extra day to have an aid for 5 days a week. [facility] are [facility] to [facility] this up their own.  No additional DME needs identified at this time (pending he can wean off of cortrak and not need PEG) Per attending- Goal is wean off tube and PO all feeds   Expected Discharge Plan: Home/Self Care Barriers to Discharge: Continued Medical Work up  Expected Discharge Plan and Services   Discharge Planning Services: CM Consult   Living arrangements for the past 2 months: Single Family Home                                       Social Determinants of Health (SDOH) Interventions SDOH Screenings   Food Insecurity: No Food Insecurity (11/21/2023)  Housing: Low Risk  (11/21/2023)  Transportation Needs: No Transportation Needs (11/21/2023)  Utilities: Not At Risk (11/21/2023)  Social Connections: Unknown (11/23/2023)  Recent Concern: Social Connections - Socially Isolated (11/21/2023)  Tobacco Use: Medium Risk (11/23/2023)    Readmission Risk Interventions    11/27/2021   10:20 AM  Readmission Risk Prevention Plan  Transportation Screening Complete  PCP or Specialist Appt within 3-5 Days Complete  HRI or Home Care Consult Complete  Social Work Consult for Recovery Care Planning/Counseling Complete  Palliative Care  Screening Not Applicable  Medication Review [employer]) Complete

## 2023-12-01 NOTE — Plan of Care (Signed)
  Problem: Nutritional: Goal: Maintenance of adequate nutrition will improve Outcome: Progressing Goal: Progress toward achieving an optimal weight will improve Outcome: Progressing   Problem: Skin Integrity: Goal: Risk for impaired skin integrity will decrease Outcome: Progressing   Problem: Nutrition: Goal: Adequate nutrition will be maintained Outcome: Progressing   Problem: Coping: Goal: Level of anxiety will decrease Outcome: Progressing   Problem: Elimination: Goal: Will not experience complications related to bowel motility Outcome: Progressing Goal: Will not experience complications related to urinary retention Outcome: Progressing   Problem: Pain Managment: Goal: General experience of comfort will improve and/or be controlled Outcome: Progressing   Problem: Safety: Goal: Ability to remain free from injury will improve Outcome: Progressing   Problem: Skin Integrity: Goal: Risk for impaired skin integrity will decrease Outcome: Progressing

## 2023-12-01 NOTE — Evaluation (Signed)
 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 male with PMH: CVA, dementia, dysphagia, HTN, s/p liver and kidney transplant, DM-2, h/o seizures. He was hospitalized in late January to early February of this year with SOB, cough, fever. He presented to hospital on 11/21/23 after sliding out of the hoyer lift at home as well as AMS, lethargy, heavy breathing pattern. In ED, SpO2 95% on RA but ABG pH 7.33, pCO2 26 and he was put on 4-5L oxygen via [place]. CXR showed vascular congestion, no overt edema. He had right thoracentesis on 3/22 which yielded of amber vs blood-tinged fluid. SLP consulted on 11/21/23 and recommended return to baseline diet of Dys 2, nectar thick liquids. SLP reordered on 12/01/23 to aid in determining if patient could discharge home on strictly a PO diet or if he would need a PEG. Currently he has an NG.    Assessment / Plan / Recommendation  Clinical Impression  SLP suspects that patient's swallow function has likely not significantly changed since initial evaluation during this admission on 11/21/23. Swallow was assessed at bedside via straw sips of nectar thick liquids which  daughter fed to patient. He exhibits suspected delayed swallow initiation but no overt s/s aspiration. Per daughter, he has had some liquids and solids but is not consistent with PO intake overall. SLP spent some time discussing current situation which is that family wants him to discharge back home, but he cannot discharge with an NG feeding tube. Options are for patient to get a PEG or for him to be able to consume enough PO's for nutrition and medication delivery. SLP encouraged daughter to consider what her father would want and to ask more questions of medical staff before making a decision of PEG or not. Based on patient's past and current PO intake, he will very likely not be able to be sustained on PO's alone. SLP did mention to daughter about consulting with palliative for a more comfort-based approach to his care. SLP will follow to provide education and input as needed. SLP Visit Diagnosis: Dysphagia, oropharyngeal phase (R13.12)    Aspiration Risk  Mild aspiration risk    Diet Recommendation Dysphagia 2 (Fine chop);Nectar-thick liquid    Medication Administration: Whole meds with puree Supervision: Full supervision/cueing for compensatory strategies;Staff to assist with self feeding Compensations: Slow rate;Small sips/bites    Other  Recommendations Oral Care Recommendations: Oral care BID;Staff/trained caregiver to provide oral care    Recommendations for follow up therapy are one component of a multi-disciplinary discharge planning process, led by the attending physician.  Recommendations may be  updated based on patient status, additional functional criteria and insurance authorization.  Follow up Recommendations Follow physician's recommendations for discharge plan and follow up therapies      Assistance Recommended at Discharge    Functional Status Assessment Patient has had a recent decline in their functional status and demonstrates the ability to make significant improvements  in function in a reasonable and predictable amount of time.  Frequency and Duration min 1 x/week  1 week       Prognosis Prognosis for improved oropharyngeal function: Fair Barriers to Reach Goals: Cognitive deficits;Time post onset      Swallow Study   General Date of Onset: 11/21/23 HPI: Patient is a 78 y.o. male with PMH: CVA, dementia, dysphagia, HTN, s/p liver and kidney transplant, DM-2, h/o seizures. He was hospitalized in late January to early February of this year with SOB, cough, fever. He presented to hospital on 11/21/23 after sliding out of the hoyer lift at home as well as AMS, lethargy, heavy breathing pattern. In ED, SpO2 95% on RA but ABG pH 7.33, pCO2 26 and he was put on 4-5L oxygen via [place]. CXR showed vascular congestion, no overt edema. He had right thoracentesis on 3/22 which yielded of amber vs blood-tinged fluid. SLP consulted on 11/21/23 and recommended return to baseline diet of Dys 2, nectar thick liquids. SLP reordered on 12/01/23 to aid in determining if patient could discharge home on strictly a PO diet or if he would need a PEG. Currently he has an NG. Type of Study: Bedside Swallow Evaluation Previous Swallow Assessment: 11/21/23 Diet Prior to this Study: Dysphagia 2 (finely chopped);Mildly thick liquids (Level 2, nectar thick) Temperature Spikes Noted: No Respiratory Status: Room air History of Recent Intubation: No Behavior/Cognition: Alert;Cooperative Oral Cavity Assessment: Within Functional Limits Oral Care Completed by SLP: No Oral Cavity - Dentition: Dentures, top;Dentures, bottom Self-Feeding Abilities: Total assist Patient Positioning: Upright in bed Baseline Vocal Quality: Not observed Volitional Cough: Cognitively unable to elicit Volitional Swallow: Unable to elicit    Oral/Motor/Sensory Function     Ice Chips     Thin Liquid Thin Liquid: Not tested    Nectar Thick Nectar Thick Liquid: Impaired Presentation: Straw Pharyngeal Phase  Impairments: Suspected delayed Swallow   Honey Thick     Puree     Solid           Angela Nevin, MA, CCC-SLP Speech Therapy

## 2023-12-01 NOTE — Progress Notes (Signed)
 [facility] NEPHROLOGY PROGRESS NOTE  Assessment/ Plan: Pt is a 78 y.o. 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 Creat here 2.5 on admission.  H/o renal transplant 2016. Pt was vol overloaded w/ diffuse UE/ LE edema and increase work of breathing initially but has improved.   - lasix intermittently as needed - dose today given net +2L - foley catheter was ordered for hx of urinary retention requiring BID in/out cath at home for the 3 weeks preceding admission  -cont cyclosporine and resumed MMF today due to UTI tx completed, appreciate pharm dosing liquid  -Creatinine is stable at around baseline, monitor lab, strict ins and outs.  # Hypernatremia - free water deficit. off bicarb for now.  Continue free water from the tube. Sodium normal today.  #hyperkalemia - lokelma PRN  # Metabolic acidosis - Mild; Cont. oral sodium bicarbonate.  #H/o liver transplant - at same time as renal transplant in 2016.   # Acute on chronic HFpEF -  ECHO done, normal LVEF.  Lasix PRN for now - dose tody   # HTN - holding losartan for now w/ AKI.  Bp acceptable currently, cont amlodipine.   #Anemia of CKD - on ESA and iron outpatient.  His last iron infusions were on 3/3 and 3/10 and his last EPO was 3/9. S/p aranesp 60 mcg once on 3/25 and weekly aranesp 60 mcg weekly.   Transfusion PRN   # H/o CVA - w/ hemiparesis.  #DVT - was started on apixiban  Will continue to follow, contact with concerns.   Subjective: Seen and examined at bedside.  1.6 L urine output.  Wife bedside.  Says some PO intake but not robust, remains on TF via NGT.   Objective Vital signs in last 24 hours: Vitals:   11/30/23 2018 11/30/23 2317 12/01/23 0521 12/01/23 0725  BP:  (!) 150/62 (!) 147/57 (!) 163/68  Pulse:  66 66 64  Resp:  20 (!) 22 (!) 33  Temp:  99.9 F (37.7 C) 100.2 F (37.9 C) 99.7 F (37.6 C)  TempSrc:  Axillary Oral Axillary   SpO2: 96% 94% 95% 96%  Weight:   89.2 kg   Height:       Weight change: 0.2 kg  Intake/Output Summary (Last 24 hours) at 12/01/2023 0731 Last data filed at 12/01/2023 0527 Gross per 24 hour  Intake 2545.63 ml  Output 1625 ml  Net 920.63 ml       Labs: RENAL PANEL Recent Labs  Lab 11/27/23 0221 11/28/23 0225 11/29/23 0213 11/30/23 1108 12/01/23 0231  NA 154* 151* 148* 146* 144  K 4.2 4.3 4.9 5.3* 5.4*  CL 130* 126* 124* 121* 119*  CO2 19* 17* 17* 17* 18*  GLUCOSE 145* 205* 250* 189* 170*  BUN 52* 44* 45* 46* 47*  CREATININE 2.21* 1.91* 1.90* 1.73* 1.70*  CALCIUM 9.8 9.5 9.4 9.4 9.3  MG  --   --   --   --  1.8  ALBUMIN  --   --   --   --  2.1*    Liver Function Tests: Recent Labs  Lab 12/01/23 0231  AST 41  ALT 52*  ALKPHOS 68  BILITOT 0.5  PROT 6.2*  ALBUMIN 2.1*   No results for input(s): "LIPASE", "AMYLASE" in the last 168 hours. No results for input(s): "AMMONIA" in the last 168 hours.  CBC: Recent Labs    09/29/23 0632 09/30/23 0734 11/22/23 0213 11/23/23 0227 11/26/23 0235 11/30/23 1108 12/01/23 0231  HGB 8.7*   < > 8.7* 8.5* 7.2* 7.3* 7.1*  MCV 75.8*   < > 76.3* 75.4* 78.0*  --  78.0*  VITAMINB12 380  --   --   --   --   --   --   FOLATE 13.8  --   --   --   --   --   --   TIBC 176*  --   --   --   --   --   --   IRON 7*  --   --   --   --   --   --    < > = values in this interval not displayed.    Cardiac Enzymes: No results for input(s): "CKTOTAL", "CKMB", "CKMBINDEX", "TROPONINI" in the last 168 hours. CBG: Recent Labs  Lab 11/30/23 0636 11/30/23 1149 11/30/23 1628 12/01/23 0531 12/01/23 0720  GLUCAP 188* 176* 222* 177* 169*    Iron Studies: No results for input(s): "IRON", "TIBC", "TRANSFERRIN", "FERRITIN" in the last 72 hours. Studies/Results: DG CHEST PORT 1 VIEW Result Date: 11/30/2023 CLINICAL DATA:  Dyspnea. EXAM: PORTABLE CHEST 1 VIEW COMPARISON:  11/23/2023 FINDINGS: Low lung volumes. Cardiopericardial silhouette is at  upper limits of normal for size. Vascular congestion noted with bibasilar atelectasis or infiltrate. Feeding tube passes to at least the level of the distal stomach. Telemetry leads overlie the chest. IMPRESSION: Low lung volumes with vascular congestion and bibasilar atelectasis or infiltrate. Electronically Signed   By: Kennith Center M.D.   On: 11/30/2023 10:56     Medications: Infusions:  feeding supplement (OSMOLITE 1.5 CAL) 55 mL/hr at 12/01/23 0527   levETIRAcetam Stopped (11/30/23 2220)    Scheduled Medications:  amLODipine  5 mg Oral Daily   apixaban  10 mg Oral BID   Followed by   Melene Muller ON 12/06/2023] apixaban  5 mg Oral BID   budesonide (PULMICORT) nebulizer solution  0.25 mg Nebulization BID   Chlorhexidine Gluconate Cloth  6 each Topical Daily   clopidogrel  75 mg Per Tube Daily   cycloSPORINE  125 mg Per Tube BID   darbepoetin (ARANESP) injection - NON-DIALYSIS  60 mcg Subcutaneous Q Tue-1800   feeding supplement  237 mL Oral BID BM   finasteride  5 mg Oral QPM   free water  250 mL Per Tube Q4H   Gerhardt's butt cream   Topical BID   insulin aspart  0-15 Units Subcutaneous TID WC   multivitamin  1 tablet Per Tube QHS   mycophenolate  250 mg Oral BID   nystatin  5 mL Oral QID   rosuvastatin  5 mg Per Tube QPM    have reviewed scheduled and prn medications.  Physical Exam: General:NAD, comfortable Heart:RRR, s1s2 nl Lungs:clear b/l, no crackle Abdomen:soft, Non-tender, non-distended  Extremities:2+ RLE ankle edema, 1+ RUE edema Neurology: Alert, awake, wearing mittens as restraints  Tyler Pita 12/01/2023,7:31 AM  LOS: 10 days

## 2023-12-01 NOTE — Progress Notes (Addendum)
 PROGRESS NOTE  Gilbert Wilson  DOB: 10-29-1945  PCP: [facility] UJW:[id number]  DOA: 11/20/2023  LOS: 10 days  Hospital Day: 12  Brief narrative: Gilbert Wilson is a 78 y.o. male with severe dementia, h/o stroke with right hemiparesis, bedbound status for 2 to 3 years, needs a Hoyer lift and is a total care by family.  PMH also significant for DM2, HTN, HLD, CVA, hep C, liver failure, kidney failure s/p cadaveric liver/kidney transplant 2016, BPH. 3/21, patient was brought to the ED from home for low blood pressure of 70/50, shortness of breath, lethargy.  Workup in the ED showed normal blood pressure. Chest x-ray showed pulmonary vascular congestion.  Legs were swollen as well.  IV Lasix was given Admitted to [facility] Nephrology was consulted because of elevated creatinine.  3/22, underwent right thoracentesis that yielded 400 mL of amber versus blood-tinged fluid. Pleural fluid culture did not show any growth.   Urine culture grew Enterococcus faecalis.  Subjective: Patient was seen and examined this morning.  Elderly African-American male.  Propped up in bed.  Has a Dobbhoff tube for feeding.  Patient's wife was at bedside. Patient is alert, awake, oriented to place only.  Has mittens on the left hand.  Not restless or agitated at the time of my evaluation  Assessment and plan: Acute exacerbation of chronic diastolic CHF HTN Presented with shortness of breath, pulm vascular congestion, overall volume overload status Given IV Lasix Echo 3/22 with EF 70 to 75%, no WMA, mild LVH. PTA meds- hydralazine 100 mg twice daily, amlodipine 5 mg twice daily, Lasix 20 mg every other day, losartan 50 mg daily Currently on amlodipine 5 mg daily.  Received intermittent IV Lasix.  Losartan on hold.  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  Gradually improved, 1.7 today. Nephrology  following Continue immunosuppression with cyclosporine and CellCept. Continue sodium bicarb tab  Recent Labs    11/24/23 2029 11/25/23 0226 11/25/23 1414 11/26/23 0235 11/26/23 1322 11/27/23 0221 11/28/23 0225 11/29/23 0213 11/30/23 1108 12/01/23 0231  BUN 65* 67* 61* 57* 52* 52* 44* 45* 46* 47*  CREATININE 2.53* 2.52* 2.51* 2.37* 2.36* 2.21* 1.91* 1.90* 1.73* 1.70*  CO2 16* 17* 20* 20* 17* 19* 17* 17* 17* 18*   Hypernatremia Sodium level went as high as 156 with diuresis.  Seen by nephrology.  Given free water from tube.  Sodium level gradually normalized. Recent Labs  Lab 11/24/23 2029 11/25/23 0226 11/25/23 1414 11/26/23 0235 11/26/23 1322 11/27/23 0221 11/28/23 0225 11/29/23 0213 11/30/23 1108 12/01/23 0231  NA 153* 155* 156* 155* 154* 154* 151* 148* 146* 144   Hyperkalemia Potassium elevated to more than 5.  Nephrology following.  Lokelma as needed Recent Labs  Lab 11/27/23 0221 11/28/23 0225 11/29/23 0213 11/30/23 1108 12/01/23 0231  K 4.2 4.3 4.9 5.3* 5.4*  MG  --   --   --   --  1.8   L leg DVT Seen on outpatient duplex done on 3/29.  Currently on a course of Eliquis 10 mg PO bid for 7 days then 5 mg PO bid   E faecalis UTI Completed a course of IV Unasyn on 3/31  H/o stroke with right hemiparesis PTA meds- Plavix, Crestor Will stop Plavix as patient is now on Eliquis because of new DVT. Continue Crestor  Dysphagia Severe malnutrition On Dobbhoff tube feeding currently.  Obtain speech eval.  Patient's daughter is not excited about  in setting of PEG tube for feeding.  Type 2 diabetes mellitus A1c 6.4 on 09/28/2023 PTA meds-Lantus 0-6 units Currently on SSI/Accu-Cheks every 4 hours Recent Labs  Lab 11/30/23 1628 12/01/23 0531 12/01/23 0720 12/01/23 1102 12/01/23 1631  GLUCAP 222* 177* 169* 173* 179*   Acute metabolic encephalopathy  Underlying dementia Continue supportive care   H/o seizure  keppra 500 mg bid   BPH Finasteride  and Flomax  Anemia of chronic disease Iron deficiency anemia Chronically low hemoglobin between 8 and 9.  Hemoglobin is currently running low without active bleeding. Received iron infusions, weekly Aranesp. Continue to monitor. Recent Labs    09/29/23 0632 09/30/23 0734 11/22/23 0213 11/23/23 0227 11/26/23 0235 11/30/23 1108 12/01/23 0231  HGB 8.7*   < > 8.7* 8.5* 7.2* 7.3* 7.1*  MCV 75.8*   < > 76.3* 75.4* 78.0*  --  78.0*  VITAMINB12 380  --   --   --   --   --   --   FOLATE 13.8  --   --   --   --   --   --   TIBC 176*  --   --   --   --   --   --   IRON 7*  --   --   --   --   --   --    < > = values in this interval not displayed.       Mobility: Bedbound status  Goals of care   Code Status: Full Code     DVT prophylaxis: Currently on Eliquis  apixaban (ELIQUIS) tablet 10 mg  apixaban (ELIQUIS) tablet 5 mg   Antimicrobials: Completed a course of antibiotics on 3/31 Fluid: None Consultants: Nephrology Family Communication: Wife at bedside  Status: Inpatient Level of care:  Progressive   Patient is from: Home Needs to continue in-hospital care: has dobhoff tube, pending speech eval Anticipated d/c to: Hopefully home      Diet:  Diet Order             DIET DYS 2 Room service appropriate? Yes; Fluid consistency: Nectar Thick  Diet effective now                   Scheduled Meds:  amLODipine  5 mg Oral Daily   apixaban  10 mg Oral BID   Followed by   Melene Muller ON 12/06/2023] apixaban  5 mg Oral BID   budesonide (PULMICORT) nebulizer solution  0.25 mg Nebulization BID   Chlorhexidine Gluconate Cloth  6 each Topical Daily   cycloSPORINE  125 mg Per Tube BID   darbepoetin (ARANESP) injection - NON-DIALYSIS  60 mcg Subcutaneous Q Tue-1800   feeding supplement  237 mL Oral BID BM   finasteride  5 mg Oral QPM   free water  250 mL Per Tube Q4H   Gerhardt's butt cream   Topical BID   insulin aspart  0-15 Units Subcutaneous Q4H   multivitamin  1  tablet Per Tube QHS   mycophenolate  250 mg Oral BID   nystatin  5 mL Oral QID   rosuvastatin  5 mg Per Tube QPM    PRN meds: acetaminophen **OR** acetaminophen, ipratropium-albuterol, melatonin, senna-docusate   Infusions:   feeding supplement (OSMOLITE 1.5 CAL) 55 mL/hr at 12/01/23 1711   levETIRAcetam Stopped (12/01/23 0912)    Antimicrobials: Anti-infectives (From admission, onward)    Start     Dose/Rate Route Frequency Ordered Stop  11/30/23 2000  Ampicillin-Sulbactam (UNASYN) 3 g in sodium chloride 0.9 % 100 mL IVPB        3 g 200 mL/hr over 30 Minutes Intravenous Every 8 hours 11/30/23 1349 11/30/23 2019   11/30/23 1345  Ampicillin-Sulbactam (UNASYN) 3 g in sodium chloride 0.9 % 100 mL IVPB  Status:  Discontinued        3 g 200 mL/hr over 30 Minutes Intravenous Every 8 hours 11/30/23 1246 11/30/23 1254   11/30/23 1345  Ampicillin-Sulbactam (UNASYN) 3 g in sodium chloride 0.9 % 100 mL IVPB  Status:  Discontinued        3 g 200 mL/hr over 30 Minutes Intravenous Every 8 hours 11/30/23 1254 11/30/23 1349   11/30/23 0000  Ampicillin-Sulbactam (UNASYN) 3 g in sodium chloride 0.9 % 100 mL IVPB  Status:  Discontinued        3 g 200 mL/hr over 30 Minutes Intravenous Every 12 hours 11/29/23 1500 11/30/23 1246   11/29/23 1915  Ampicillin-Sulbactam (UNASYN) 3 g in sodium chloride 0.9 % 100 mL IVPB  Status:  Discontinued        3 g 200 mL/hr over 30 Minutes Intravenous Every 8 hours 11/29/23 1457 11/29/23 1500   11/23/23 1115  Ampicillin-Sulbactam (UNASYN) 3 g in sodium chloride 0.9 % 100 mL IVPB  Status:  Discontinued        3 g 200 mL/hr over 30 Minutes Intravenous Every 12 hours 11/23/23 1016 11/29/23 1457   11/22/23 2200  vancomycin (VANCOREADY) IVPB 2000 mg/400 mL  Status:  Discontinued        2,000 mg 200 mL/hr over 120 Minutes Intravenous Every 48 hours 11/21/23 0252 11/22/23 1257   11/21/23 1000  ceFEPIme (MAXIPIME) 2 g in sodium chloride 0.9 % 100 mL IVPB  Status:   Discontinued        2 g 200 mL/hr over 30 Minutes Intravenous Every 12 hours 11/21/23 0250 11/23/23 1016   11/21/23 0015  vancomycin (VANCOREADY) IVPB 2000 mg/400 mL        2,000 mg 200 mL/hr over 120 Minutes Intravenous  Once 11/21/23 0000 11/21/23 0244   11/21/23 0015  ceFEPIme (MAXIPIME) 2 g in sodium chloride 0.9 % 100 mL IVPB        2 g 200 mL/hr over 30 Minutes Intravenous  Once 11/21/23 0000 11/21/23 0113       Objective: Vitals:   12/01/23 1106 12/01/23 1634  BP: (!) 148/61 (!) 150/73  Pulse: 64 68  Resp: (!) 24 (!) 32  Temp: 99.7 F (37.6 C) 99.3 F (37.4 C)  SpO2: 97% 95%    Intake/Output Summary (Last 24 hours) at 12/01/2023 1726 Last data filed at 12/01/2023 1711 Gross per 24 hour  Intake 1780.33 ml  Output 3075 ml  Net -1294.67 ml   Filed Weights   11/29/23 0400 11/30/23 0541 12/01/23 0521  Weight: 87 kg 89 kg 89.2 kg   Weight change: 0.2 kg Body mass index is 25.94 kg/m.   Physical Exam: General exam: Pleasant, elderly African-American male.  Not in physical distress Skin: No rashes, lesions or ulcers. HEENT: Atraumatic, normocephalic, no obvious bleeding. Has a Dobbhoff tube for feeding Lungs: Clear to auscultation bilaterally,  CVS: S1, S2, no murmur,   GI/Abd: Soft, nontender, nondistended, bowel sound present,   CNS: Alert, awake, knows he is in the hospital.  Demented at baseline Psychiatry: Mood appropriate,  Extremities: Improving bilateral pedal edema, no calf tenderness,   Data Review: I have personally reviewed  the laboratory data and studies available.  F/u labs  Unresulted Labs (From admission, onward)     Start     Ordered   12/02/23 0500  Basic metabolic panel with GFR  Tomorrow morning,   R       Question:  Specimen collection method  Answer:  Lab=Lab collect   12/01/23 1334   12/02/23 0500  CBC with Differential/Platelet  Tomorrow morning,   R       Question:  Specimen collection method  Answer:  Lab=Lab collect   12/01/23 1334             Total time spent in review of labs and imaging, patient evaluation, formulation of plan, documentation and communication with family: 55 minutes  Signed, Lorin Glass, MD [facility] 12/01/2023

## 2023-12-02 DIAGNOSIS — J189 Pneumonia, unspecified organism: Secondary | ICD-10-CM | POA: Diagnosis not present

## 2023-12-02 LAB — BASIC METABOLIC PANEL WITH GFR
Anion gap: 8 (ref 5–15)
BUN: 52 mg/dL — ABNORMAL HIGH (ref 8–23)
CO2: 19 mmol/L — ABNORMAL LOW (ref 22–32)
Calcium: 9.4 mg/dL (ref 8.9–10.3)
Chloride: 114 mmol/L — ABNORMAL HIGH (ref 98–111)
Creatinine, Ser: 1.69 mg/dL — ABNORMAL HIGH (ref 0.61–1.24)
GFR, Estimated: 42 mL/min — ABNORMAL LOW (ref >60)
Glucose, Bld: 178 mg/dL — ABNORMAL HIGH (ref 70–99)
Potassium: 5.4 mmol/L — ABNORMAL HIGH (ref 3.5–5.1)
Sodium: 141 mmol/L (ref 135–145)

## 2023-12-02 LAB — CBC WITH DIFFERENTIAL/PLATELET
Abs Immature Granulocytes: 0.08 10*3/uL — ABNORMAL HIGH (ref 0.00–0.07)
Basophils Absolute: 0.1 10*3/uL (ref 0.0–0.1)
Basophils Relative: 1 %
Eosinophils Absolute: 0.4 10*3/uL (ref 0.0–0.5)
Eosinophils Relative: 5 %
HCT: 25 % — ABNORMAL LOW (ref 39.0–52.0)
Hemoglobin: 7.7 g/dL — ABNORMAL LOW (ref 13.0–17.0)
Immature Granulocytes: 1 %
Lymphocytes Relative: 18 %
Lymphs Abs: 1.4 10*3/uL (ref 0.7–4.0)
MCH: 23.9 pg — ABNORMAL LOW (ref 26.0–34.0)
MCHC: 30.8 g/dL (ref 30.0–36.0)
MCV: 77.6 fL — ABNORMAL LOW (ref 80.0–100.0)
Monocytes Absolute: 0.7 10*3/uL (ref 0.1–1.0)
Monocytes Relative: 8 %
Neutro Abs: 5.3 10*3/uL (ref 1.7–7.7)
Neutrophils Relative %: 67 %
Platelets: 341 10*3/uL (ref 150–400)
RBC: 3.22 MIL/uL — ABNORMAL LOW (ref 4.22–5.81)
RDW: 17.5 % — ABNORMAL HIGH (ref 11.5–15.5)
WBC: 7.9 10*3/uL (ref 4.0–10.5)
nRBC: 0 % (ref 0.0–0.2)

## 2023-12-02 LAB — GLUCOSE, CAPILLARY
Glucose-Capillary: 164 mg/dL — ABNORMAL HIGH (ref 70–99)
Glucose-Capillary: 200 mg/dL — ABNORMAL HIGH (ref 70–99)
Glucose-Capillary: 227 mg/dL — ABNORMAL HIGH (ref 70–99)
Glucose-Capillary: 170 mg/dL — ABNORMAL HIGH (ref 70–99)
Glucose-Capillary: 167 mg/dL — ABNORMAL HIGH (ref 70–99)

## 2023-12-02 MED ORDER — CYCLOSPORINE MODIFIED (NEORAL) 100 MG/ML PO SOLN: 125.0000 mg | Freq: Two times a day (BID) | ORAL | 0 refills | Status: AC

## 2023-12-02 MED ORDER — FREE WATER: 200.0000 mL | Status: DC

## 2023-12-02 MED ORDER — SODIUM ZIRCONIUM CYCLOSILICATE 10 G PO PACK
10.0000 g | PACK | Freq: Once | ORAL | Status: AC
Start: 2023-12-02 — End: 2023-12-02
  Administered 2023-12-02: 10 g via ORAL
  Filled 2023-12-02: qty 1

## 2023-12-02 NOTE — Plan of Care (Signed)
  Problem: Education: Goal: Ability to describe self-care measures that may prevent or decrease complications (Diabetes Survival Skills Education) will improve Outcome: Progressing Goal: Individualized Educational Video(s) Outcome: Progressing   Problem: Coping: Goal: Ability to adjust to condition or change in health will improve Outcome: Progressing   Problem: Fluid Volume: Goal: Ability to maintain a balanced intake and output will improve Outcome: Progressing   Problem: Health Behavior/Discharge Planning: Goal: Ability to identify and utilize available resources and services will improve Outcome: Progressing Goal: Ability to manage health-related needs will improve Outcome: Progressing   Problem: Metabolic: Goal: Ability to maintain appropriate glucose levels will improve Outcome: Progressing   Problem: Nutritional: Goal: Maintenance of adequate nutrition will improve Outcome: Progressing Goal: Progress toward achieving an optimal weight will improve Outcome: Progressing   Problem: Skin Integrity: Goal: Risk for impaired skin integrity will decrease Outcome: Progressing   Problem: Tissue Perfusion: Goal: Adequacy of tissue perfusion will improve Outcome: Progressing   Problem: Education: Goal: Knowledge of General Education information will improve Description: Including pain rating scale, medication(s)/side effects and non-pharmacologic comfort measures Outcome: Progressing   Problem: Health Behavior/Discharge Planning: Goal: Ability to manage health-related needs will improve Outcome: Progressing   Problem: Clinical Measurements: Goal: Ability to maintain clinical measurements within normal limits will improve Outcome: Progressing Goal: Will remain free from infection Outcome: Progressing Goal: Diagnostic test results will improve Outcome: Progressing Goal: Respiratory complications will improve Outcome: Progressing Goal: Cardiovascular complication will  be avoided Outcome: Progressing   Problem: Activity: Goal: Risk for activity intolerance will decrease Outcome: Progressing   Problem: Nutrition: Goal: Adequate nutrition will be maintained Outcome: Progressing   Problem: Elimination: Goal: Will not experience complications related to bowel motility Outcome: Progressing Goal: Will not experience complications related to urinary retention Outcome: Progressing   Problem: Pain Managment: Goal: General experience of comfort will improve and/or be controlled Outcome: Progressing   Problem: Safety: Goal: Ability to remain free from injury will improve Outcome: Progressing   Problem: Skin Integrity: Goal: Risk for impaired skin integrity will decrease Outcome: Progressing

## 2023-12-02 NOTE — Progress Notes (Addendum)
 Nutrition Follow-up  DOCUMENTATION CODES:   Severe malnutrition in context of acute illness/injury  INTERVENTION:  Hold tube feed to assess PO intake Continue Ensure Enlive po BID (nectar thick), each supplement provides 350 kcal and 20 grams of protein.  Continue Magic cup BID with meals, each supplement provides 290 kcal and 9 grams of protein Continue Renal MVI   NUTRITION DIAGNOSIS:  Severe Malnutrition related to acute illness as evidenced by moderate fat depletion, moderate muscle depletion. - remains applicable  GOAL:  Patient will meet greater than or equal to 90% of their needs - remains applicable  MONITOR:  PO intake, Labs, Weight trends, Supplement acceptance, TF tolerance, Skin, Diet advancement  REASON FOR ASSESSMENT:  Consult Assessment of nutrition requirement/status, Enteral/tube feeding initiation and management  ASSESSMENT:   Pt with PMH of severe dementia, T2DM, ESRD and liver disease s/p transplant (2016), seizures, BPH, and HLD. Admitted with acute on chronic hypoxic respiratory failure d/t acute on chronic CHF as well as CAP, AKI, UTI and encephalopathy and metabolic acidosis.  3/22 admitted, thoracentesis ( pull) 3/25 D5 initiated 3/26 D5W increased 171ml/hr 3/27 small bore tube placed by IF, TF initiated 4/1 urethral catheter placed 4/2 TF being held to assess PO intake  No events overnight. Edema and volume overload has improved. Continues with Foley catheter. Hypernatremia has resolved w/ free water flushes. Discontinued by nephrology today to ensure he can maintain hydration at discharge. Labs to be drawn tomorrow. He remains at significant risk for nutrition-related decline 2/2 advanced dementia dx. Thrush resolved. No noted N/V/C/D.   Average Meal Intake 3/31: 50-75% x2 documented meals 4/1: 25% x1 documented meal  Pt remains on artificial nutrition via Cortrak. Family not amicable to PEG placement currently. MD agreeable d/t advanced  dementia dx.  MD requesting hold tube feeds to assess oral/hydration intake, as daughter reports desirable intake of breakfast meal. Continues on dysphagia 2, nectar thick diet, which is his baseline.   Admit Weight: 95.3kg Current Weight: 87.6kg Lowest Weight: 87kg on 3/30  Volume overloaded on admission. Showing 8% wt loss since that time w/ improvement in symptoms. Edema improved, however still present. Bowels stable. Not currently receiving diuretics.  Intake/Output Summary (Last 24 hours) at 12/02/2023 1651 Last data filed at 12/02/2023 1531 Gross per 24 hour  Intake 1378.75 ml  Output 1300 ml  Net 78.75 ml    Net IO Since Admission: 896.54 mL [12/02/23 1651]   Drains Lines: Dobhoff (82 cm - gastric) Foley catheter UOP 3.1L x24 hours  Potassium elevated today. Lokelma ordered. Creatinine stable. Hypernatremia resolved w/ FWFs.   Meds: cyclosporine, darbepoetin alfa, SSI 0-15 Q4 hours, renal MVI, mycophelolate  Labs:  Na+ 148>146>144>141 (wdl) K+ 5.3>5.4>5.4 (H) Hgb 7.3>7.1>7.7 (L) Crt 1.73>1.70>1.69 (H) CBGs 170-178 x24 hours A1c 6.4 (09/2023)   Diet Order:   Diet Order             DIET DYS 2 Room service appropriate? Yes; Fluid consistency: Nectar Thick  Diet effective now             EDUCATION NEEDS:  Education needs have been addressed  Skin:  Skin Assessment: Skin Integrity Issues: Skin Integrity Issues:: Other (Comment) Stage I: sacrum Other: skin abrasion to R lateral back  Last BM:  4/1 - type 6 x3  Height:  Ht Readings from Last 1 Encounters:  11/20/23 6\' 1"  (1.854 m)   Weight:  Wt Readings from Last 1 Encounters:  12/02/23 87.6 kg   Ideal Body Weight:  83.6  kg  BMI:  Body mass index is 25.48 kg/m.  Estimated Nutritional Needs:   Kcal:  1900-2100kcal  Protein:  95-110g  Fluid:  >1.9L/day  Myrtie Cruise MS, RD, LDN Registered Dietitian Clinical Nutrition RD Inpatient Contact Info in Amion

## 2023-12-02 NOTE — Progress Notes (Signed)
 Gilbert Wilson  Assessment/ Plan: Pt is a 78 y.o. 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 Creat here 2.5 on admission.  H/o renal transplant 2016. Pt was vol overloaded w/ diffuse UE/ LE edema and increase work of breathing initially but has improved.   - foley catheter was ordered for hx of urinary retention requiring BID in/out cath at home for the 3 weeks preceding admission  -cont cyclosporine and resumed MMF today due to UTI tx completed, appreciate pharm dosing liquid  -Creatinine is stable at around baseline, monitor lab, strict ins and outs.  # Hypernatremia - free water deficit. off bicarb for now.  In prep for possible d/c will d/c FWF to ensure he can maintain adequate input  #hyperkalemia - lokelma PRN; common with CysA - will check level 9pm tonight  # Metabolic acidosis - Mild; Cont. oral sodium bicarbonate.  #H/o liver transplant - at same time as renal transplant in 2016.   # Acute on chronic HFpEF -  ECHO done, normal LVEF.  Lasix PRN for now,   # HTN - holding losartan for now w/ AKI.  Bp acceptable currently, cont amlodipine.   #Anemia of CKD - on ESA and iron outpatient.  His last iron infusions were on 3/3 and 3/10 and his last EPO was 3/9. S/p aranesp 60 mcg once on 3/25 and weekly aranesp 60 mcg weekly.   Transfusion PRN   # H/o CVA - w/ hemiparesis.  #DVT - was started on apixiban  Will continue to follow, contact with concerns.   Subjective: Seen and examined at bedside.  3.1 L urine output, net neg 1.5L after dose lasix.  Daughter bedside.  Says some PO intake but not robust, remains on TF via NGT.  Poss discharge planning underway.   Objective Vital signs in last 24 hours: Vitals:   12/01/23 2327 12/02/23 0329 12/02/23 0718 12/02/23 0730  BP: (!) 145/58 (!) 149/71 (!) 140/63   Pulse: 77 68 62 64  Resp: (!) 31 (!) 22 (!) 26 (!) 28  Temp: 99.3 F  (37.4 C) 97.8 F (36.6 C) 97.8 F (36.6 C)   TempSrc: Axillary Oral Oral   SpO2: 96% 95% 94% 97%  Weight:  87.6 kg    Height:       Weight change: -1.6 kg  Intake/Output Summary (Last 24 hours) at 12/02/2023 1046 Last data filed at 12/02/2023 0334 Gross per 24 hour  Intake 1415.33 ml  Output 3100 ml  Net -1684.67 ml       Labs: RENAL PANEL Recent Labs  Lab 11/28/23 0225 11/29/23 0213 11/30/23 1108 12/01/23 0231 12/02/23 0234  NA 151* 148* 146* 144 141  K 4.3 4.9 5.3* 5.4* 5.4*  CL 126* 124* 121* 119* 114*  CO2 17* 17* 17* 18* 19*  GLUCOSE 205* 250* 189* 170* 178*  BUN 44* 45* 46* 47* 52*  CREATININE 1.91* 1.90* 1.73* 1.70* 1.69*  CALCIUM 9.5 9.4 9.4 9.3 9.4  MG  --   --   --  1.8  --   ALBUMIN  --   --   --  2.1*  --     Liver Function Tests: Recent Labs  Lab 12/01/23 0231  AST 41  ALT 52*  ALKPHOS 68  BILITOT 0.5  PROT 6.2*  ALBUMIN 2.1*   No results for input(s): "LIPASE", "AMYLASE" in  the last 168 hours. No results for input(s): "AMMONIA" in the last 168 hours. CBC: Recent Labs    09/29/23 0632 09/30/23 0734 11/23/23 0227 11/26/23 0235 11/30/23 1108 12/01/23 0231 12/02/23 0234  HGB 8.7*   < > 8.5* 7.2* 7.3* 7.1* 7.7*  MCV 75.8*   < > 75.4* 78.0*  --  78.0* 77.6*  VITAMINB12 380  --   --   --   --   --   --   FOLATE 13.8  --   --   --   --   --   --   TIBC 176*  --   --   --   --   --   --   IRON 7*  --   --   --   --   --   --    < > = values in this interval not displayed.    Cardiac Enzymes: No results for input(s): "CKTOTAL", "CKMB", "CKMBINDEX", "TROPONINI" in the last 168 hours. CBG: Recent Labs  Lab 12/01/23 1102 12/01/23 1631 12/01/23 2104 12/02/23 0329 12/02/23 0748  GLUCAP 173* 179* 175* 164* 200*    Iron Studies: No results for input(s): "IRON", "TIBC", "TRANSFERRIN", "FERRITIN" in the last 72 hours. Studies/Results: No results found.    Medications: Infusions:  feeding supplement (OSMOLITE 1.5 CAL) 55 mL/hr at  12/02/23 0334   levETIRAcetam 500 mg (12/02/23 0823)    Scheduled Medications:  amLODipine  5 mg Oral Daily   apixaban  10 mg Oral BID   Followed by   Melene Muller ON 12/06/2023] apixaban  5 mg Oral BID   budesonide (PULMICORT) nebulizer solution  0.25 mg Nebulization BID   Chlorhexidine Gluconate Cloth  6 each Topical Daily   cycloSPORINE  125 mg Per Tube BID   darbepoetin (ARANESP) injection - NON-DIALYSIS  60 mcg Subcutaneous Q Tue-1800   feeding supplement  237 mL Oral BID BM   finasteride  5 mg Oral QPM   free water  250 mL Per Tube Q4H   Gerhardt's butt cream   Topical BID   insulin aspart  0-15 Units Subcutaneous Q4H   multivitamin  1 tablet Per Tube QHS   mycophenolate  250 mg Oral BID   nystatin  5 mL Oral QID   rosuvastatin  5 mg Per Tube QPM   sodium zirconium cyclosilicate  10 g Oral Once    have reviewed scheduled and prn medications.  Physical Exam: General:NAD, comfortable Heart:RRR, s1s2 nl Lungs:clear b/l, no crackle Abdomen:soft, Non-tender, non-distended  Extremities:2+ RLE ankle edema, 1+ RUE edema Neurology: Alert, awake, wearing mittens as restraints  Gilbert Wilson 12/02/2023,10:46 AM  LOS: 11 days

## 2023-12-02 NOTE — Progress Notes (Signed)
 PROGRESS NOTE  Gilbert Wilson  DOB: 06-24-1946  PCP: [facility] ZOX:[id number]  DOA: 11/20/2023  LOS: 11 days  Hospital Day: 13  Brief narrative: Gilbert Wilson is a 78 y.o. male with severe dementia, h/o stroke with right hemiparesis, bedbound status for 2 to 3 years, needs a Hoyer lift and is a total care by family.  PMH also significant for DM2, HTN, HLD, CVA, hep C, liver failure, kidney failure s/p cadaveric liver/kidney transplant 2016, BPH. 3/21, patient was brought to the ED from home for low blood pressure of 70/50, shortness of breath, lethargy.  Workup in the ED showed normal blood pressure. Chest x-ray showed pulmonary vascular congestion.  Legs were swollen as well.  IV Lasix was given Admitted to [facility] Nephrology was consulted because of elevated creatinine.  3/22, underwent right thoracentesis that yielded 400 mL of amber versus blood-tinged fluid. Pleural fluid culture did not show any growth.   Urine culture grew Enterococcus faecalis.  Subjective: Patient was seen and examined this morning.  Propped up in bed.  Not in distress.  Not restless or agitated.  His daughter Gilbert Wilson was at bedside. Was seen by speech yesterday.  Gilbert Wilson tells that he ate a good portion of breakfast today. Blood pressure in 140s, Labs this morning with potassium still elevated at 5.4, creatinine stable at 1.69, hemoglobin better at 7.7  Assessment and plan: Acute exacerbation of chronic diastolic CHF HTN Presented with shortness of breath, pulm vascular congestion, overall volume overload status Given IV Lasix Echo 3/22 with EF 70 to 75%, no WMA, mild LVH. PTA meds- hydralazine 100 mg twice daily, amlodipine 5 mg twice daily, Lasix 20 mg every other day, losartan 50 mg daily Currently on amlodipine 5 mg daily.  Received intermittent IV Lasix.  Losartan on hold.  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  Gradually improved, 1.7 today. Nephrology following Continue immunosuppression with cyclosporine and CellCept.  At the request of the family, I sent a prescription for syrup form of cyclosporine Continue sodium bicarb tab  Recent Labs    11/25/23 0226 11/25/23 1414 11/26/23 0235 11/26/23 1322 11/27/23 0221 11/28/23 0225 11/29/23 0213 11/30/23 1108 12/01/23 0231 12/02/23 0234  BUN 67* 61* 57* 52* 52* 44* 45* 46* 47* 52*  CREATININE 2.52* 2.51* 2.37* 2.36* 2.21* 1.91* 1.90* 1.73* 1.70* 1.69*  CO2 17* 20* 20* 17* 19* 17* 17* 17* 18* 19*   Dysphagia Severe malnutrition On Dobbhoff tube feeding currently.   Seen by speech on 4//1.  His daughter tells that he ate a good portion of breakfast today.   Family is not positive about PEG tube feeding.  I think it is appropriate to avoid PEG tube in someone with advanced dementia. Discussed with nephrologist.  Will pause tube feeding to monitor how able patient is to maintain his hydration and renal function. Repeat labs tomorrow.  Hypernatremia Sodium level went as high as 156 with diuresis.  Seen by nephrology.  Given free water from tube.  Sodium level gradually normalized. Recent Labs  Lab 11/25/23 1414 11/26/23 0235 11/26/23 1322 11/27/23 0221 11/28/23 0225 11/29/23 0213 11/30/23 1108 12/01/23 0231 12/02/23 0234  NA 156* 155* 154* 154* 151* 148* 146* 144 141   Hyperkalemia Potassium elevated to more than 5.  Nephrology following.  Potassium at 5.4 this morning again.  I ordered for 1 dose of Lokelma. Recent Labs  Lab 11/28/23 0225 11/29/23 0213 11/30/23 1108 12/01/23 0231  12/02/23 0234  K 4.3 4.9 5.3* 5.4* 5.4*  MG  --   --   --  1.8  --    L leg DVT Seen on outpatient duplex done on 3/29.  Currently on a course of Eliquis 10 mg PO bid for 7 days then 5 mg PO bid   E faecalis UTI Completed a course of IV Unasyn on 3/31  H/o stroke with right hemiparesis PTA meds- Plavix,  Crestor Will stop Plavix as patient is now on Eliquis because of new DVT. Continue Crestor  Type 2 diabetes mellitus A1c 6.4 on 09/28/2023 PTA meds-Lantus 0-6 units Currently on SSI/Accu-Cheks every 4 hours Recent Labs  Lab 12/01/23 1631 12/01/23 2104 12/02/23 0329 12/02/23 0748 12/02/23 1119  GLUCAP 179* 175* 164* 200* 227*   Acute metabolic encephalopathy  Underlying dementia Continue supportive care   H/o seizure  Continue keppra 500 mg bid   BPH Continue finasteride and Flomax  Anemia of chronic disease Iron deficiency anemia Chronically low hemoglobin between 8 and 9.  Hemoglobin is currently running low without active bleeding. Received iron infusions, weekly Aranesp. Continue to monitor. Recent Labs    09/29/23 0632 09/30/23 0734 11/23/23 0227 11/26/23 0235 11/30/23 1108 12/01/23 0231 12/02/23 0234  HGB 8.7*   < > 8.5* 7.2* 7.3* 7.1* 7.7*  MCV 75.8*   < > 75.4* 78.0*  --  78.0* 77.6*  VITAMINB12 380  --   --   --   --   --   --   FOLATE 13.8  --   --   --   --   --   --   TIBC 176*  --   --   --   --   --   --   IRON 7*  --   --   --   --   --   --    < > = values in this interval not displayed.   Mobility: Bedbound status  Goals of care   Code Status: Full Code     DVT prophylaxis: Currently on Eliquis  apixaban (ELIQUIS) tablet 10 mg  apixaban (ELIQUIS) tablet 5 mg   Antimicrobials: Completed a course of antibiotics on 3/31 Fluid: None Consultants: Nephrology Family Communication: Daughter at bedside  Status: Inpatient Level of care:  Progressive   Patient is from: Home Needs to continue in-hospital care: Potassium elevated.   Anticipated d/c to: Will monitor his appetite and hydration without tube     Diet:  Diet Order             DIET DYS 2 Room service appropriate? Yes; Fluid consistency: Nectar Thick  Diet effective now                   Scheduled Meds:  amLODipine  5 mg Oral Daily   apixaban  10 mg Oral BID    Followed by   Melene Muller ON 12/06/2023] apixaban  5 mg Oral BID   budesonide (PULMICORT) nebulizer solution  0.25 mg Nebulization BID   Chlorhexidine Gluconate Cloth  6 each Topical Daily   cycloSPORINE  125 mg Per Tube BID   darbepoetin (ARANESP) injection - NON-DIALYSIS  60 mcg Subcutaneous Q Tue-1800   feeding supplement  237 mL Oral BID BM   finasteride  5 mg Oral QPM   Gerhardt's butt cream   Topical BID   insulin aspart  0-15 Units Subcutaneous Q4H   multivitamin  1 tablet Per Tube QHS   mycophenolate  250 mg Oral BID   nystatin  5 mL Oral QID   rosuvastatin  5 mg Per Tube QPM    PRN meds: acetaminophen **OR** acetaminophen, ipratropium-albuterol, melatonin, senna-docusate   Infusions:   levETIRAcetam 500 mg (12/02/23 0823)    Antimicrobials: Anti-infectives (From admission, onward)    Start     Dose/Rate Route Frequency Ordered Stop   11/30/23 2000  Ampicillin-Sulbactam (UNASYN) 3 g in sodium chloride 0.9 % 100 mL IVPB        3 g 200 mL/hr over 30 Minutes Intravenous Every 8 hours 11/30/23 1349 11/30/23 2019   11/30/23 1345  Ampicillin-Sulbactam (UNASYN) 3 g in sodium chloride 0.9 % 100 mL IVPB  Status:  Discontinued        3 g 200 mL/hr over 30 Minutes Intravenous Every 8 hours 11/30/23 1246 11/30/23 1254   11/30/23 1345  Ampicillin-Sulbactam (UNASYN) 3 g in sodium chloride 0.9 % 100 mL IVPB  Status:  Discontinued        3 g 200 mL/hr over 30 Minutes Intravenous Every 8 hours 11/30/23 1254 11/30/23 1349   11/30/23 0000  Ampicillin-Sulbactam (UNASYN) 3 g in sodium chloride 0.9 % 100 mL IVPB  Status:  Discontinued        3 g 200 mL/hr over 30 Minutes Intravenous Every 12 hours 11/29/23 1500 11/30/23 1246   11/29/23 1915  Ampicillin-Sulbactam (UNASYN) 3 g in sodium chloride 0.9 % 100 mL IVPB  Status:  Discontinued        3 g 200 mL/hr over 30 Minutes Intravenous Every 8 hours 11/29/23 1457 11/29/23 1500   11/23/23 1115  Ampicillin-Sulbactam (UNASYN) 3 g in sodium chloride 0.9  % 100 mL IVPB  Status:  Discontinued        3 g 200 mL/hr over 30 Minutes Intravenous Every 12 hours 11/23/23 1016 11/29/23 1457   11/22/23 2200  vancomycin (VANCOREADY) IVPB 2000 mg/400 mL  Status:  Discontinued        2,000 mg 200 mL/hr over 120 Minutes Intravenous Every 48 hours 11/21/23 0252 11/22/23 1257   11/21/23 1000  ceFEPIme (MAXIPIME) 2 g in sodium chloride 0.9 % 100 mL IVPB  Status:  Discontinued        2 g 200 mL/hr over 30 Minutes Intravenous Every 12 hours 11/21/23 0250 11/23/23 1016   11/21/23 0015  vancomycin (VANCOREADY) IVPB 2000 mg/400 mL        2,000 mg 200 mL/hr over 120 Minutes Intravenous  Once 11/21/23 0000 11/21/23 0244   11/21/23 0015  ceFEPIme (MAXIPIME) 2 g in sodium chloride 0.9 % 100 mL IVPB        2 g 200 mL/hr over 30 Minutes Intravenous  Once 11/21/23 0000 11/21/23 0113       Objective: Vitals:   12/02/23 0730 12/02/23 1122  BP:  136/63  Pulse: 64 67  Resp: (!) 28 20  Temp:  97.8 F (36.6 C)  SpO2: 97% 98%    Intake/Output Summary (Last 24 hours) at 12/02/2023 1338 Last data filed at 12/02/2023 0334 Gross per 24 hour  Intake 1415.33 ml  Output 1300 ml  Net 115.33 ml   Filed Weights   11/30/23 0541 12/01/23 0521 12/02/23 0329  Weight: 89 kg 89.2 kg 87.6 kg   Weight change: -1.6 kg Body mass index is 25.48 kg/m.   Physical Exam: General exam: Pleasant, elderly African-American male.  Not in physical distress Skin: No rashes, lesions or ulcers. HEENT: Atraumatic, normocephalic, no obvious bleeding. Has a  Dobbhoff tube for feeding Lungs: Clear to auscultation bilaterally,  CVS: S1, S2, no murmur,   GI/Abd: Soft, nontender, nondistended, bowel sound present,   CNS: Alert, awake, knows he is in the hospital.  Demented at baseline.  Baseline right hemiparesis.  Has mittens in left hand. Psychiatry: Mood appropriate,  Extremities: Improving bilateral pedal edema, no calf tenderness,   Data Review: I have personally reviewed the laboratory  data and studies available.  F/u labs  Unresulted Labs (From admission, onward)     Start     Ordered   12/03/23 0500  Basic metabolic panel with GFR  Tomorrow morning,   R       Question:  Specimen collection method  Answer:  Lab=Lab collect   12/02/23 1101   12/03/23 0500  CBC with Differential/Platelet  Tomorrow morning,   R       Question:  Specimen collection method  Answer:  Lab=Lab collect   12/02/23 1101   12/02/23 2100  Cyclosporine  Once,   R       Comments: **TIMED TEST check 9-10pm 12/02/23   Question:  Specimen collection method  Answer:  Lab=Lab collect   12/02/23 1048            Total time spent in review of labs and imaging, patient evaluation, formulation of plan, documentation and communication with family: 45 minutes  Signed, Lorin Glass, MD [facility] 12/02/2023

## 2023-12-03 ENCOUNTER — Other Ambulatory Visit (HOSPITAL_COMMUNITY): Payer: Self-pay

## 2023-12-03 DIAGNOSIS — J189 Pneumonia, unspecified organism: Secondary | ICD-10-CM | POA: Diagnosis not present

## 2023-12-03 LAB — CBC WITH DIFFERENTIAL/PLATELET
Abs Immature Granulocytes: 0.09 10*3/uL — ABNORMAL HIGH (ref 0.00–0.07)
Basophils Absolute: 0.1 10*3/uL (ref 0.0–0.1)
Basophils Relative: 1 %
Eosinophils Absolute: 0.4 10*3/uL (ref 0.0–0.5)
Eosinophils Relative: 4 %
HCT: 24.5 % — ABNORMAL LOW (ref 39.0–52.0)
Hemoglobin: 7.7 g/dL — ABNORMAL LOW (ref 13.0–17.0)
Immature Granulocytes: 1 %
Lymphocytes Relative: 17 %
Lymphs Abs: 1.4 10*3/uL (ref 0.7–4.0)
MCH: 24.5 pg — ABNORMAL LOW (ref 26.0–34.0)
MCHC: 31.4 g/dL (ref 30.0–36.0)
MCV: 78 fL — ABNORMAL LOW (ref 80.0–100.0)
Monocytes Absolute: 0.7 10*3/uL (ref 0.1–1.0)
Monocytes Relative: 8 %
Neutro Abs: 5.6 10*3/uL (ref 1.7–7.7)
Neutrophils Relative %: 69 %
Platelets: 311 10*3/uL (ref 150–400)
RBC: 3.14 MIL/uL — ABNORMAL LOW (ref 4.22–5.81)
RDW: 18.2 % — ABNORMAL HIGH (ref 11.5–15.5)
WBC: 8.2 10*3/uL (ref 4.0–10.5)
nRBC: 0 % (ref 0.0–0.2)

## 2023-12-03 LAB — BASIC METABOLIC PANEL WITH GFR
Anion gap: 9 (ref 5–15)
BUN: 58 mg/dL — ABNORMAL HIGH (ref 8–23)
CO2: 19 mmol/L — ABNORMAL LOW (ref 22–32)
Calcium: 9.4 mg/dL (ref 8.9–10.3)
Chloride: 115 mmol/L — ABNORMAL HIGH (ref 98–111)
Creatinine, Ser: 1.72 mg/dL — ABNORMAL HIGH (ref 0.61–1.24)
GFR, Estimated: 41 mL/min — ABNORMAL LOW (ref >60)
Glucose, Bld: 123 mg/dL — ABNORMAL HIGH (ref 70–99)
Potassium: 5.3 mmol/L — ABNORMAL HIGH (ref 3.5–5.1)
Sodium: 143 mmol/L (ref 135–145)

## 2023-12-03 LAB — GLUCOSE, CAPILLARY
Glucose-Capillary: 128 mg/dL — ABNORMAL HIGH (ref 70–99)
Glucose-Capillary: 134 mg/dL — ABNORMAL HIGH (ref 70–99)
Glucose-Capillary: 157 mg/dL — ABNORMAL HIGH (ref 70–99)
Glucose-Capillary: 192 mg/dL — ABNORMAL HIGH (ref 70–99)

## 2023-12-03 LAB — MAGNESIUM: Magnesium: 1.9 mg/dL (ref 1.7–2.4)

## 2023-12-03 MED ORDER — ROSUVASTATIN CALCIUM 5 MG PO TABS: 5.0000 mg | ORAL_TABLET | Freq: Every evening | ORAL | Status: DC

## 2023-12-03 MED ORDER — AMLODIPINE BESYLATE 5 MG PO TABS: 5.0000 mg | ORAL_TABLET | Freq: Every day | ORAL | Status: DC

## 2023-12-03 MED ORDER — RENA-VITE PO TABS: 1.0000 | ORAL_TABLET | Freq: Every day | ORAL | Status: DC

## 2023-12-03 MED ORDER — MAGNESIUM OXIDE -MG SUPPLEMENT 400 (240 MG) MG PO TABS
400.0000 mg | ORAL_TABLET | Freq: Once | ORAL | Status: AC
Start: 2023-12-03 — End: 2023-12-03
  Administered 2023-12-03: 400 mg via ORAL
  Filled 2023-12-03: qty 1

## 2023-12-03 MED ORDER — ACETAMINOPHEN 500 MG PO TABS: 500.0000 mg | ORAL_TABLET | Freq: Four times a day (QID) | ORAL | Status: DC | PRN

## 2023-12-03 MED ORDER — LOKELMA 10 G PO PACK: 10.0000 g | PACK | Freq: Every day | ORAL | 0 refills | Status: DC

## 2023-12-03 MED ORDER — FUROSEMIDE 20 MG PO TABS: 20.0000 mg | ORAL_TABLET | Freq: Every day | ORAL | 0 refills | Status: DC | PRN

## 2023-12-03 MED ORDER — CYCLOSPORINE MODIFIED (NEORAL) 100 MG/ML PO SOLN
125.0000 mg | Freq: Two times a day (BID) | ORAL | Status: DC
  Filled 2023-12-03: qty 1.3

## 2023-12-03 MED ORDER — LEVETIRACETAM 100 MG/ML PO SOLN
500.0000 mg | Freq: Two times a day (BID) | ORAL | Status: DC
  Administered 2023-12-03: 500 mg via ORAL
  Filled 2023-12-03 (×2): qty 5

## 2023-12-03 MED ORDER — APIXABAN 5 MG PO TABS: ORAL_TABLET | ORAL | 0 refills | Status: DC

## 2023-12-03 NOTE — TOC Transition Note (Signed)
 Transition of Care [facility]) - Discharge Note   Patient Details  Name: Gilbert Wilson MRN: [id number] Date of Birth: 13-Mar-1946  Transition of Care [facility]) CM/SW Contact:  Harriet Masson, RN Phone Number: 12/03/2023, 12:35 PM   Clinical Narrative:    Patient stable for discharge.  Notified wife, Myrene Buddy,  of discharge, PTAR called.  Patient has all needed DME.   Address, Phone number and PCP verified.  Final next level of care: Home/Self Care Barriers to Discharge: Barriers Resolved   Patient Goals and CMS Choice Patient states their goals for this hospitalization and ongoing recovery are:: family wants him to return home CMS Medicare.gov Compare Post Acute Care list provided to:: Patient Represenative (must comment)        Discharge Placement             Home          Discharge Plan and Services Additional resources added to the After Visit Summary for     Discharge Planning Services: CM Consult                                 Social Drivers of Health (SDOH) Interventions SDOH Screenings   Food Insecurity: No Food Insecurity (11/21/2023)  Housing: Low Risk  (11/21/2023)  Transportation Needs: No Transportation Needs (11/21/2023)  Utilities: Not At Risk (11/21/2023)  Social Connections: Unknown (11/23/2023)  Recent Concern: Social Connections - Socially Isolated (11/21/2023)  Tobacco Use: Medium Risk (11/23/2023)     Readmission Risk Interventions    11/27/2021   10:20 AM  Readmission Risk Prevention Plan  Transportation Screening Complete  PCP or Specialist Appt within 3-5 Days Complete  HRI or Home Care Consult Complete  Social Work Consult for Recovery Care Planning/Counseling Complete  Palliative Care Screening Not Applicable  Medication Review Oceanographer) Complete

## 2023-12-03 NOTE — Plan of Care (Signed)
 Family provided with AVS Discharge papers and questions answered. Case management notified of PTAR mode of transportation home request. Foley and Cortrek removed per order and L forearm PIV removed.  Problem: Education: Goal: Ability to describe self-care measures that may prevent or decrease complications (Diabetes Survival Skills Education) will improve 12/03/2023 1137 by Carollee Leitz, RN Outcome: Adequate for Discharge 12/03/2023 1136 by Carollee Leitz, RN Outcome: Adequate for Discharge Goal: Individualized Educational Video(s) 12/03/2023 1137 by Carollee Leitz, RN Outcome: Adequate for Discharge 12/03/2023 1136 by Carollee Leitz, RN Outcome: Adequate for Discharge   Problem: Coping: Goal: Ability to adjust to condition or change in health will improve 12/03/2023 1137 by Carollee Leitz, RN Outcome: Adequate for Discharge 12/03/2023 1136 by Carollee Leitz, RN Outcome: Adequate for Discharge   Problem: Fluid Volume: Goal: Ability to maintain a balanced intake and output will improve 12/03/2023 1137 by Carollee Leitz, RN Outcome: Adequate for Discharge 12/03/2023 1136 by Carollee Leitz, RN Outcome: Adequate for Discharge   Problem: Health Behavior/Discharge Planning: Goal: Ability to identify and utilize available resources and services will improve 12/03/2023 1137 by Carollee Leitz, RN Outcome: Adequate for Discharge 12/03/2023 1136 by Carollee Leitz, RN Outcome: Adequate for Discharge Goal: Ability to manage health-related needs will improve 12/03/2023 1137 by Carollee Leitz, RN Outcome: Adequate for Discharge 12/03/2023 1136 by Carollee Leitz, RN Outcome: Adequate for Discharge   Problem: Metabolic: Goal: Ability to maintain appropriate glucose levels will improve 12/03/2023 1137 by Carollee Leitz, RN Outcome: Adequate for Discharge 12/03/2023 1136 by Carollee Leitz, RN Outcome: Adequate for Discharge   Problem: Nutritional: Goal: Maintenance of adequate  nutrition will improve 12/03/2023 1137 by Carollee Leitz, RN Outcome: Adequate for Discharge 12/03/2023 1136 by Carollee Leitz, RN Outcome: Adequate for Discharge Goal: Progress toward achieving an optimal weight will improve 12/03/2023 1137 by Carollee Leitz, RN Outcome: Adequate for Discharge 12/03/2023 1136 by Carollee Leitz, RN Outcome: Adequate for Discharge   Problem: Skin Integrity: Goal: Risk for impaired skin integrity will decrease 12/03/2023 1137 by Carollee Leitz, RN Outcome: Adequate for Discharge 12/03/2023 1136 by Carollee Leitz, RN Outcome: Adequate for Discharge   Problem: Tissue Perfusion: Goal: Adequacy of tissue perfusion will improve 12/03/2023 1137 by Carollee Leitz, RN Outcome: Adequate for Discharge 12/03/2023 1136 by Carollee Leitz, RN Outcome: Adequate for Discharge   Problem: Education: Goal: Knowledge of General Education information will improve Description: Including pain rating scale, medication(s)/side effects and non-pharmacologic comfort measures 12/03/2023 1137 by Carollee Leitz, RN Outcome: Adequate for Discharge 12/03/2023 1136 by Carollee Leitz, RN Outcome: Adequate for Discharge   Problem: Health Behavior/Discharge Planning: Goal: Ability to manage health-related needs will improve 12/03/2023 1137 by Carollee Leitz, RN Outcome: Adequate for Discharge 12/03/2023 1136 by Carollee Leitz, RN Outcome: Adequate for Discharge   Problem: Clinical Measurements: Goal: Ability to maintain clinical measurements within normal limits will improve 12/03/2023 1137 by Carollee Leitz, RN Outcome: Adequate for Discharge 12/03/2023 1136 by Carollee Leitz, RN Outcome: Adequate for Discharge Goal: Will remain free from infection 12/03/2023 1137 by Carollee Leitz, RN Outcome: Adequate for Discharge 12/03/2023 1136 by Carollee Leitz, RN Outcome: Adequate for Discharge Goal: Diagnostic test results will improve 12/03/2023 1137 by Carollee Leitz,  RN Outcome: Adequate for Discharge 12/03/2023 1136 by Carollee Leitz, RN Outcome: Adequate for Discharge Goal: Respiratory complications will improve 12/03/2023 1137 by Truett Mainland  D, RN Outcome: Adequate for Discharge 12/03/2023 1136 by Carollee Leitz, RN Outcome: Adequate for Discharge Goal: Cardiovascular complication will be avoided 12/03/2023 1137 by Carollee Leitz, RN Outcome: Adequate for Discharge 12/03/2023 1136 by Carollee Leitz, RN Outcome: Adequate for Discharge   Problem: Activity: Goal: Risk for activity intolerance will decrease 12/03/2023 1137 by Carollee Leitz, RN Outcome: Adequate for Discharge 12/03/2023 1136 by Carollee Leitz, RN Outcome: Adequate for Discharge   Problem: Nutrition: Goal: Adequate nutrition will be maintained 12/03/2023 1137 by Carollee Leitz, RN Outcome: Adequate for Discharge 12/03/2023 1136 by Carollee Leitz, RN Outcome: Adequate for Discharge   Problem: Coping: Goal: Level of anxiety will decrease 12/03/2023 1137 by Carollee Leitz, RN Outcome: Adequate for Discharge 12/03/2023 1136 by Carollee Leitz, RN Outcome: Adequate for Discharge   Problem: Elimination: Goal: Will not experience complications related to bowel motility 12/03/2023 1137 by Carollee Leitz, RN Outcome: Adequate for Discharge 12/03/2023 1136 by Carollee Leitz, RN Outcome: Adequate for Discharge Goal: Will not experience complications related to urinary retention 12/03/2023 1137 by Carollee Leitz, RN Outcome: Adequate for Discharge 12/03/2023 1136 by Carollee Leitz, RN Outcome: Adequate for Discharge   Problem: Pain Managment: Goal: General experience of comfort will improve and/or be controlled 12/03/2023 1137 by Carollee Leitz, RN Outcome: Adequate for Discharge 12/03/2023 1136 by Carollee Leitz, RN Outcome: Adequate for Discharge   Problem: Safety: Goal: Ability to remain free from injury will improve 12/03/2023 1137 by Carollee Leitz, RN Outcome:  Adequate for Discharge 12/03/2023 1136 by Carollee Leitz, RN Outcome: Adequate for Discharge   Problem: Skin Integrity: Goal: Risk for impaired skin integrity will decrease 12/03/2023 1137 by Carollee Leitz, RN Outcome: Adequate for Discharge 12/03/2023 1136 by Carollee Leitz, RN Outcome: Adequate for Discharge

## 2023-12-03 NOTE — Progress Notes (Signed)
 [facility] NEPHROLOGY PROGRESS NOTE  Assessment/ Plan: Pt is a 78 y.o. 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 Creat here 2.5 on admission.  H/o renal transplant 2016. Pt was vol overloaded w/ diffuse UE/ LE edema and increase work of breathing initially but has improved.   - foley catheter was ordered for hx of urinary retention requiring BID in/out cath at home for the 3 weeks preceding admission  --> on d/c resume I/O cath per family pref -cont cyclosporine and MMF - appreciate pharmacy assistance in changing to liquid -labs are stable today with just po intake so ok w discharge today -check cysA level and kidney function next week through [place]   # Hypernatremia - resolved and able to maintain with just po intake  #hyperkalemia - lokelma PRN; common with CysA - has been requiring lokelma daily here so rec cont outpt until f/u labs checked  # Metabolic acidosis - Mild; Cont. oral sodium bicarbonate.  #H/o liver transplant - at same time as renal transplant in 2016.   # Acute on chronic HFpEF -  ECHO done, normal LVEF.  Lasix PRN for now, would d/c with 40 po PRN worsening edema   # HTN - holding losartan for now w/ AKI.  Bp acceptable currently, cont amlodipine.   #Anemia of CKD - on ESA and iron outpatient.  His last iron infusions were on 3/3 and 3/10 and his last EPO was 3/9. S/p aranesp 60 mcg once on 3/25 and weekly aranesp 60 mcg weekly.   Transfusion PRN   # H/o CVA - w/ hemiparesis.  #DVT - was started on apixiban  D/cing today to family support - his family is comfortable and capable with at home care  Subjective: Seen and examined at bedside.  2.1L UOP no diuretics. Daughter bedside, wife on phone.   We stopped NG support yesterday and he's been able to take all meds and maintain po intake adequately with family support.   Objective Vital signs in last 24 hours: Vitals:   12/03/23 0413  12/03/23 0500 12/03/23 0715 12/03/23 0936  BP: (!) 140/61  125/64 (!) 147/59  Pulse: 67  99   Resp: (!) 28  20   Temp: 98.9 F (37.2 C)  98.8 F (37.1 C)   TempSrc: Axillary  Axillary   SpO2: 97%  98%   Weight:  88.5 kg    Height:       Weight change: 0.9 kg  Intake/Output Summary (Last 24 hours) at 12/03/2023 1107 Last data filed at 12/03/2023 1027 Gross per 24 hour  Intake 901.75 ml  Output 2575 ml  Net -1673.25 ml       Labs: RENAL PANEL Recent Labs  Lab 11/29/23 0213 11/30/23 1108 12/01/23 0231 12/02/23 0234 12/03/23 0241 12/03/23 0839  NA 148* 146* 144 141 143  --   K 4.9 5.3* 5.4* 5.4* 5.3*  --   CL 124* 121* 119* 114* 115*  --   CO2 17* 17* 18* 19* 19*  --   GLUCOSE 250* 189* 170* 178* 123*  --   BUN 45* 46* 47* 52* 58*  --   CREATININE 1.90* 1.73* 1.70* 1.69* 1.72*  --   CALCIUM 9.4 9.4 9.3 9.4 9.4  --   MG  --   --  1.8  --   --  1.9  ALBUMIN  --   --  2.1*  --   --   --     Liver Function Tests: Recent Labs  Lab 12/01/23 0231  AST 41  ALT 52*  ALKPHOS 68  BILITOT 0.5  PROT 6.2*  ALBUMIN 2.1*   No results for input(s): "LIPASE", "AMYLASE" in the last 168 hours. No results for input(s): "AMMONIA" in the last 168 hours. CBC: Recent Labs    09/29/23 0632 09/30/23 0734 11/26/23 0235 11/30/23 1108 12/01/23 0231 12/02/23 0234 12/03/23 0241  HGB 8.7*   < > 7.2* 7.3* 7.1* 7.7* 7.7*  MCV 75.8*   < > 78.0*  --  78.0* 77.6* 78.0*  VITAMINB12 380  --   --   --   --   --   --   FOLATE 13.8  --   --   --   --   --   --   TIBC 176*  --   --   --   --   --   --   IRON 7*  --   --   --   --   --   --    < > = values in this interval not displayed.    Cardiac Enzymes: No results for input(s): "CKTOTAL", "CKMB", "CKMBINDEX", "TROPONINI" in the last 168 hours. CBG: Recent Labs  Lab 12/02/23 1621 12/02/23 2011 12/02/23 2357 12/03/23 0534 12/03/23 0711  GLUCAP 170* 167* 157* 128* 134*    Iron Studies: No results for input(s): "IRON", "TIBC",  "TRANSFERRIN", "FERRITIN" in the last 72 hours. Studies/Results: No results found.    Medications: Infusions:    Scheduled Medications:  amLODipine  5 mg Oral Daily   apixaban  10 mg Oral BID   Followed by   Melene Muller ON 12/06/2023] apixaban  5 mg Oral BID   budesonide (PULMICORT) nebulizer solution  0.25 mg Nebulization BID   Chlorhexidine Gluconate Cloth  6 each Topical Daily   cycloSPORINE  125 mg Oral BID   darbepoetin (ARANESP) injection - NON-DIALYSIS  60 mcg Subcutaneous Q Tue-1800   feeding supplement  237 mL Oral BID BM   finasteride  5 mg Oral QPM   Gerhardt's butt cream   Topical BID   insulin aspart  0-15 Units Subcutaneous Q4H   levETIRAcetam  500 mg Oral BID   multivitamin  1 tablet Oral QHS   mycophenolate  250 mg Oral BID   rosuvastatin  5 mg Oral QPM    have reviewed scheduled and prn medications.  Physical Exam: General:NAD, comfortable Heart:RRR, s1s2 nl Lungs:clear b/l, no crackle Abdomen:soft, Non-tender, non-distended  Extremities:1+ RLE ankle edema, 1+ RUE edema Neurology: Alert, awake, wearing mittens as restraints  Tyler Pita 12/03/2023,11:07 AM  LOS: 12 days

## 2023-12-03 NOTE — Discharge Summary (Signed)
 Physician Discharge Summary  Gilbert Wilson RUE:[id number] DOB: 12-18-1945 DOA: 11/20/2023  PCP: [facility]  Admit date: 11/20/2023 Discharge date: 12/03/2023  Admitted From: Home Discharge disposition: Home  Recommendations at discharge:  Encourage oral intake and hydration at home Your blood pressure medicines have been reduced.  Continue amlodipine 5 mg daily.  Resume Lasix as needed.  Losartan and hydralazine have been held You been started on [facility] daily because of elevated potassium level Continue In-N-Out catheterization at home You have been started on a blood thinner.  Watch out for any obvious bleeding or easy bruisability.   Brief narrative: Gilbert Wilson is a 78 y.o. male with severe dementia, h/o stroke with right hemiparesis, bedbound status for 2 to 3 years, needs a Hoyer lift and is a total care by family.  PMH also significant for DM2, HTN, HLD, CVA, hep C, liver failure, kidney failure s/p cadaveric liver/kidney transplant 2016, BPH. 3/21, patient was brought to the ED from home for low blood pressure of 70/50, shortness of breath, lethargy.  Workup in the ED showed normal blood pressure. Chest x-ray showed pulmonary vascular congestion.  Legs were swollen as well.  IV Lasix was given Admitted to [facility] Nephrology was consulted because of elevated creatinine.  3/22, underwent right thoracentesis that yielded 400 mL of amber versus blood-tinged fluid. Pleural fluid culture did not show any growth.   Urine culture grew Enterococcus faecalis.  Subjective: Patient was seen and examined this morning.   Lying on bed.  Not in distress.  Daughter at bedside.   Daughter is happy with his appetite and oral hydration.   Lab from this morning with no significant worsening of potassium or creatinine  Hospital course: Acute exacerbation of chronic diastolic CHF HTN Presented with shortness of breath, pulm vascular congestion, overall volume overload  status Given IV Lasix Echo 3/22 with EF 70 to 75%, no WMA, mild LVH. PTA meds- hydralazine 100 mg twice daily, amlodipine 5 mg twice daily, Lasix 20 mg every other day, losartan 50 mg daily Currently blood pressure is controlled on amlodipine 5 mg daily. Discussed with nephrologist this morning.  Plan to continue amlodipine 5 mg daily at home.  Resume Lasix 20 mg as needed.  Losartan and hydrate resume remains on hold.  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  Gradually improved and stabilized, 1.7 today. Nephrology following Continue immunosuppression with cyclosporine and CellCept.  At the request of the family, I sent a prescription for syrup form of cyclosporine Continue sodium bicarb tab  Recent Labs    11/25/23 1414 11/26/23 0235 11/26/23 1322 11/27/23 0221 11/28/23 0225 11/29/23 0213 11/30/23 1108 12/01/23 0231 12/02/23 0234 12/03/23 0241  BUN 61* 57* 52* 52* 44* 45* 46* 47* 52* 58*  CREATININE 2.51* 2.37* 2.36* 2.21* 1.91* 1.90* 1.73* 1.70* 1.69* 1.72*  CO2 20* 20* 17* 19* 17* 17* 17* 18* 19* 19*   Dysphagia Severe malnutrition On Dobbhoff tube feeding currently.   Seen by speech on 4//1.  Limited appetite. Family is not willing to go for PEG tube feeding. I think it is an appropriate decision in someone with advanced dementia. 4/2, Cortrak bleeding was held.  Patient was encouraged for oral appetite and hydration. His daughter tells that he ate a good portion of breakfast today.   Labs this morning with creatinine and potassium stable. Okay to pull out Cortrak tube.  Discharge home today.  Hypernatremia Sodium level went as  high as 156 with diuresis.  Seen by nephrology.  Given free water from tube.  Sodium level gradually normalized. Recent Labs  Lab 11/26/23 1322 11/27/23 0221 11/28/23 0225 11/29/23 0213 11/30/23 1108 12/01/23 0231 12/02/23 0234  12/03/23 0241  NA 154* 154* 151* 148* 146* 144 141 143   Hyperkalemia Potassium level remains elevated likely due to cyclosporine.   Discussed with nephrologist this morning.  Will discharge on Lokelma 10 mg daily Recent Labs  Lab 11/29/23 0213 11/30/23 1108 12/01/23 0231 12/02/23 0234 12/03/23 0241 12/03/23 0839  K 4.9 5.3* 5.4* 5.4* 5.3*  --   MG  --   --  1.8  --   --  1.9   L leg DVT Seen on outpatient duplex done on 3/29.  Started on course of Eliquis 10 mg PO bid for 7 days then 5 mg PO bid Prescription given.  E faecalis UTI Completed a course of IV Unasyn on 3/31  H/o stroke with right hemiparesis PTA meds-aspirin, Plavix, Crestor Will stop Plavix as patient is now on Eliquis because of new DVT.  Okay to continue aspirin Continue Crestor  Type 2 diabetes mellitus A1c 6.4 on 09/28/2023 PTA meds-Lantus 0-6 units Continue previous home regimen Recent Labs  Lab 12/02/23 1621 12/02/23 2011 12/02/23 2357 12/03/23 0534 12/03/23 0711  GLUCAP 170* 167* 157* 128* 134*   Acute metabolic encephalopathy  Underlying dementia Continue supportive care   H/o seizure  Continue keppra 500 mg bid   Chronic urinary retention  BPH Family was doing in and out catheterization at twice daily at home.  In the hospital, Foley catheter was inserted for output monitoring.   DC Foley catheter today.  Continue In-N-Out catheterization at home.   Continue finasteride and Flomax.  Anemia of chronic disease Iron deficiency anemia Chronically low hemoglobin between 8 and 9.  Hemoglobin is currently running low without active bleeding. Received iron infusions, weekly Aranesp. Continue to monitor. Recent Labs    09/29/23 0632 09/30/23 0734 11/26/23 0235 11/30/23 1108 12/01/23 0231 12/02/23 0234 12/03/23 0241  HGB 8.7*   < > 7.2* 7.3* 7.1* 7.7* 7.7*  MCV 75.8*   < > 78.0*  --  78.0* 77.6* 78.0*  VITAMINB12 380  --   --   --   --   --   --   FOLATE 13.8  --   --   --   --    --   --   TIBC 176*  --   --   --   --   --   --   IRON 7*  --   --   --   --   --   --    < > = values in this interval not displayed.   Mobility: Bedbound status  Goals of care   Code Status: Full Code   Consultants: Nephrology Family Communication: Daughter at bedside  Diet:  Diet Order             Diet general           DIET DYS 2 Room service appropriate? Yes; Fluid consistency: Nectar Thick  Diet effective now                   Nutritional status:  Body mass index is 25.74 kg/m.  Nutrition Problem: Severe Malnutrition Etiology: acute illness Signs/Symptoms: moderate fat depletion, moderate muscle depletion  Wounds:  - Pressure Injury 11/23/23 Sacrum Mid Stage 1 -  Intact skin with non-blanchable  redness of a localized area usually over a bony prominence. (Active)  Date First Assessed/Time First Assessed: 11/23/23 1226   Location: Sacrum  Location Orientation: Mid  Staging: Stage 1 -  Intact skin with non-blanchable redness of a localized area usually over a bony prominence.    Assessments 11/23/2023  8:00 AM 12/02/2023  8:00 PM  Dressing Type Foam - Lift dressing to assess site every shift Foam - Lift dressing to assess site every shift  Dressing Changed Clean, Dry, Intact  Dressing Change Frequency -- Every 3 days  Site / Wound Assessment Clean;Dry Clean;Dry  Peri-wound Assessment -- Intact  Margins -- Unattached edges (unapproximated)  Drainage Amount None --  Treatment Cleansed;Off loading;Other (Comment) --     No associated orders.     Wound / Incision (Open or Dehisced) 11/29/23 Non-pressure wound Back Lateral;Right;Upper abrasion/skin tear (Active)  Date First Assessed/Time First Assessed: 11/29/23 1930   Wound Type: Non-pressure wound  Location: Back  Location Orientation: Lateral;Right;Upper  Wound Description (Comments): abrasion/skin tear  Present on Admission: No    Assessments 11/29/2023  7:41 PM 12/02/2023  8:00 PM  Dressing Type Foam - Lift  dressing to assess site every shift Foam - Lift dressing to assess site every shift  Dressing Changed New --  Dressing Status Clean, Dry, Intact Clean, Dry, Intact  Dressing Change Frequency PRN PRN  Site / Wound Assessment Pink;Painful Pink  Peri-wound Assessment Intact Intact  Wound Length (cm) 5 cm --  Wound Width (cm) 1 cm --  Wound Surface Area (cm^2) 5 cm^2 --  Margins Unattached edges (unapproximated) Unattached edges (unapproximated)  Closure None None  Drainage Amount Scant --  Drainage Description Serous --  Treatment Cleansed --     No associated orders.    Discharge Exam:   Vitals:   12/03/23 0413 12/03/23 0500 12/03/23 0715 12/03/23 0936  BP: (!) 140/61  125/64 (!) 147/59  Pulse: 67  99   Resp: (!) 28  20   Temp: 98.9 F (37.2 C)  98.8 F (37.1 C)   TempSrc: Axillary  Axillary   SpO2: 97%  98%   Weight:  88.5 kg    Height:        Body mass index is 25.74 kg/m.  General exam: Pleasant, elderly African-American male.  Not in physical distress Skin: No rashes, lesions or ulcers. HEENT: Atraumatic, normocephalic, no obvious bleeding. Has a Dobbhoff tube for feeding Lungs: Clear to auscultation bilaterally,  CVS: S1, S2, no murmur,   GI/Abd: Soft, nontender, nondistended, bowel sound present,   CNS: Alert, awake, knows he is in the hospital.  Demented at baseline.  Baseline right hemiparesis. Psychiatry: Mood appropriate,  Extremities: Improving bilateral pedal edema, no calf tenderness,   Follow ups:    Follow-up Information     [facility].   Contact information: [address] [facility] [place] [address] [phone]                 Discharge Instructions:   Discharge Instructions     Call MD for:  difficulty breathing, headache or visual disturbances   Complete by: As directed    Call MD for:  extreme fatigue   Complete by: As directed    Call MD for:  hives   Complete by: As directed    Call MD  for:  persistant dizziness or light-headedness   Complete by: As directed    Call MD for:  persistant nausea and vomiting   Complete  by: As directed    Call MD for:  severe uncontrolled pain   Complete by: As directed    Call MD for:  temperature >100.4   Complete by: As directed    Diet general   Complete by: As directed    Discharge instructions   Complete by: As directed    Recommendations at discharge:   Encourage oral intake and hydration at home  Your blood pressure medicines have been reduced.  Continue amlodipine 5 mg daily.  Resume Lasix as needed.  Losartan and hydralazine have been held  You been started on [facility] daily because of elevated potassium level  Continue In-N-Out catheterization at home  You have been started on a blood thinner.  Watch out for any obvious bleeding or easy bruisability.  General discharge instructions: Follow with Primary MD [facility] in [facility] days  Please request your PCP  to go over your hospital tests, procedures, radiology results at the follow up. Please get your medicines reviewed and adjusted.  Your PCP may decide to repeat certain labs or tests as needed. Do not drive, operate heavy machinery, perform activities at heights, swimming or participation in water activities or provide baby sitting services if your were admitted for syncope or siezures until you have seen by Primary MD or a Neurologist and advised to do so again. [place] Controlled Substance Reporting System database was reviewed. Do not drive, operate heavy machinery, perform activities at heights, swim, participate in water activities or provide baby-sitting services while on medications for pain, sleep and mood until your outpatient physician has reevaluated you and advised to do so again.  You are strongly recommended to comply with the dose, frequency and duration of prescribed medications. Activity: As tolerated with Full fall precautions use walker/cane &  assistance as needed Avoid using any recreational substances like cigarette, tobacco, alcohol, or non-prescribed drug. If you experience worsening of your admission symptoms, develop shortness of breath, life threatening emergency, suicidal or homicidal thoughts you must seek medical attention immediately by calling 911 or calling your MD immediately  if symptoms less severe. You must read complete instructions/literature along with all the possible adverse reactions/side effects for all the medicines you take and that have been prescribed to you. Take any new medicine only after you have completely understood and accepted all the possible adverse reactions/side effects.  Wear Seat belts while driving. You were cared for by a hospitalist during your hospital stay. If you have any questions about your discharge medications or the care you received while you were in the hospital after you are discharged, you can call the unit and ask to speak with the hospitalist or the covering physician. Once you are discharged, your primary care physician will handle any further medical issues. Please note that NO REFILLS for any discharge medications will be authorized once you are discharged, as it is imperative that you return to your primary care physician (or establish a relationship with a primary care physician if you do not have one).   Discharge wound care:   Complete by: As directed    Increase activity slowly   Complete by: As directed        Discharge Medications:   Allergies as of 12/03/2023       Reactions   Levaquin [levofloxacin] Itching   Linezolid    Seizures   Lisinopril Cough        Medication List     PAUSE taking these medications  omeprazole 20 MG capsule Wait to take this until your doctor or other care provider tells you to start again. Commonly known as: PRILOSEC Take 20 mg by mouth daily as needed.       STOP taking these medications    clopidogrel 75 MG  tablet Commonly known as: PLAVIX   cycloSPORINE modified 100 MG capsule Commonly known as: NEORAL Replaced by: cycloSPORINE 100 MG/ML microemulsion solution   hydrALAZINE 100 MG tablet Commonly known as: APRESOLINE   losartan 50 MG tablet Commonly known as: COZAAR       TAKE these medications    acetaminophen 500 MG tablet Commonly known as: TYLENOL Take 1 tablet (500 mg total) by mouth every 6 (six) hours as needed for moderate pain (pain score 4-6), headache or fever. What changed: how much to take   albuterol 108 (90 Base) MCG/ACT inhaler Commonly known as: VENTOLIN HFA Inhale 2 puffs into the lungs every 6 (six) hours as needed for wheezing or shortness of breath.   amLODipine 5 MG tablet Commonly known as: NORVASC Take 1 tablet (5 mg total) by mouth daily. What changed: when to take this   apixaban 5 MG Tabs tablet Commonly known as: ELIQUIS Complete 7 days of initiation with 10mg  BID on 12/05/2023.  Continue family gram twice daily after that   ascorbic acid 250 MG tablet Commonly known as: VITAMIN C Take 250 mg by mouth at bedtime. Take 1 tablet by mouth with the 325mg  iron tablet.   calcitRIOL 0.25 MCG capsule Commonly known as: ROCALTROL Take 0.25 mcg by mouth daily.   Cholecalciferol 50 MCG (2000 UT) Caps Take 1 capsule by mouth daily. Take with 0.4mg  of Tamsulosin   cycloSPORINE 100 MG/ML microemulsion solution Commonly known as: NEORAL Place 1.3 mLs (130 mg total) into feeding tube 2 (two) times daily. Replaces: cycloSPORINE modified 100 MG capsule   Ensure High Protein Liqd Take 237 mLs by mouth 2 (two) times daily.   epoetin alfa 16109 UNIT/ML injection Commonly known as: EPOGEN Inject 10,000 Units into the skin once. Give every 10 days   ferrous sulfate 325 (65 FE) MG EC tablet Take 325 mg by mouth at bedtime. Take 1 tablet by mouth with the 250mg  vitamin c tablet.   finasteride 5 MG tablet Commonly known as: PROSCAR Take 5 mg by mouth  every evening.   furosemide 20 MG tablet Commonly known as: LASIX Take 1 tablet (20 mg total) by mouth daily as needed for edema. What changed:  when to take this reasons to take this   Lantus SoloStar 100 UNIT/ML Solostar Pen Generic drug: insulin glargine Inject 0-6 Units into the skin at bedtime. If BS>130-Take 6 units, Increase 1 unit for every 10 mg/dl above 604.   levETIRAcetam 750 MG tablet Commonly known as: KEPPRA Take 1 tablet (750 mg total) by mouth 2 (two) times daily.   Lokelma 10 g Pack packet Generic drug: sodium zirconium cyclosilicate Take 10 g by mouth daily.   MAGNESIUM OXIDE 400 PO Take 400 mg by mouth daily.   mirtazapine 15 MG tablet Commonly known as: REMERON Take 0.5 tablets (7.5 mg total) by mouth at bedtime. What changed: how much to take   mycophenolate 180 MG EC tablet Commonly known as: MYFORTIC Take 180 mg by mouth 2 (two) times daily.   rosuvastatin 5 MG tablet Commonly known as: CRESTOR Take 5 mg by mouth every evening.   tamsulosin 0.4 MG Caps capsule Commonly known as: FLOMAX Take 0.4 mg by  mouth daily. Take 1 tablet by mouth with Vit D3 2000iu               Discharge Care Instructions  (From admission, onward)           Start     Ordered   12/03/23 0000  Discharge wound care:        12/03/23 1119             The results of significant diagnostics from this hospitalization (including imaging, microbiology, ancillary and laboratory) are listed below for reference.    Procedures and Diagnostic Studies:   ECHOCARDIOGRAM COMPLETE Result Date: 11/21/2023    ECHOCARDIOGRAM REPORT   Patient Name:   Gilbert Wilson Date of Exam: 11/21/2023 Medical Rec #:  [id number]       Height:       73.0 in Accession #:    [id number]      Weight:       210.0 lb Date of Birth:  [birth year]       BSA:          2.197 m Patient Age:    77 years        BP:           159/70 mmHg Patient Gender: M               HR:           68 bpm. Exam  Location:  Inpatient Procedure: 2D Echo, Cardiac Doppler and Color Doppler (Both Spectral and Color            Flow Doppler were utilized during procedure). Indications:    CHF- Acute Diastolic  History:        Patient has prior history of Echocardiogram examinations, most                 recent 11/25/2021. Arrythmias:Atrial Fibrillation; Risk                 Factors:Hypertension and Diabetes.  Sonographer:    Rosaland Lao Referring Phys: Kylie.Dienes DEBBY CROSLEY IMPRESSIONS  1. Left ventricular ejection fraction, by estimation, is 70 to 75%. The left ventricle has hyperdynamic function. The left ventricle has no regional wall motion abnormalities. There is mild left ventricular hypertrophy. Left ventricular diastolic parameters are indeterminate.  2. Right ventricular systolic function is normal. The right ventricular size is normal.  3. Left atrial size was mildly dilated.  4. The mitral valve is normal in structure. No evidence of mitral valve regurgitation. No evidence of mitral stenosis. Moderate mitral annular calcification.  5. The aortic valve is calcified. There is moderate calcification of the aortic valve. There is mild thickening of the aortic valve. Aortic valve regurgitation is trivial. Mild aortic valve stenosis. Aortic valve area, by VTI measures 2.34 cm. Aortic valve mean gradient measures 12.0 mmHg. Aortic valve Vmax measures 2.12 m/s.  6. There is Moderate (Grade III) plaque involving the aortic root.  7. The inferior vena cava is normal in size with greater than 50% respiratory variability, suggesting right atrial pressure of 3 mmHg. FINDINGS  Left Ventricle: Left ventricular ejection fraction, by estimation, is 70 to 75%. The left ventricle has hyperdynamic function. The left ventricle has no regional wall motion abnormalities. The left ventricular internal cavity size was normal in size. There is mild left ventricular hypertrophy. Left ventricular diastolic parameters are indeterminate. Right  Ventricle: The right ventricular size is normal. No increase in right ventricular wall thickness. Right ventricular  systolic function is normal. Left Atrium: Left atrial size was mildly dilated. Right Atrium: Right atrial size was normal in size. Pericardium: There is no evidence of pericardial effusion. Mitral Valve: The mitral valve is normal in structure. Moderate mitral annular calcification. No evidence of mitral valve regurgitation. No evidence of mitral valve stenosis. Tricuspid Valve: The tricuspid valve is normal in structure. Tricuspid valve regurgitation is trivial. No evidence of tricuspid stenosis. Aortic Valve: The aortic valve is calcified. There is moderate calcification of the aortic valve. There is mild thickening of the aortic valve. Aortic valve regurgitation is trivial. Mild aortic stenosis is present. Aortic valve mean gradient measures 12.0 mmHg. Aortic valve peak gradient measures 18.0 mmHg. Aortic valve area, by VTI measures 2.34 cm. Pulmonic Valve: The pulmonic valve was normal in structure. Pulmonic valve regurgitation is not visualized. No evidence of pulmonic stenosis. Aorta: The aortic root is normal in size and structure. There is moderate (Grade III) plaque involving the aortic root. Venous: The inferior vena cava is normal in size with greater than 50% respiratory variability, suggesting right atrial pressure of 3 mmHg. IAS/Shunts: No atrial level shunt detected by color flow Doppler.  LEFT VENTRICLE PLAX 2D LVIDd:         4.10 cm LVIDs:         2.70 cm LV PW:         1.30 cm LV IVS:        1.30 cm LVOT diam:     2.20 cm LV SV:         97 LV SV Index:   44 LVOT Area:     3.80 cm  LV Volumes (MOD) LV vol d, MOD A4C: 158.0 ml LV vol s, MOD A4C: 57.2 ml LV SV MOD A4C:     158.0 ml RIGHT VENTRICLE RV S prime:     12.90 cm/s TAPSE (M-mode): 1.4 cm LEFT ATRIUM             Index        RIGHT ATRIUM           Index LA diam:        4.30 cm 1.96 cm/m   RA Area:     16.80 cm LA Vol (A2C):    51.6 ml 23.49 ml/m  RA Volume:   38.30 ml  17.44 ml/m LA Vol (A4C):   58.9 ml 26.81 ml/m LA Biplane Vol: 55.2 ml 25.13 ml/m  AORTIC VALVE AV Area (Vmax):    2.53 cm AV Area (Vmean):   2.34 cm AV Area (VTI):     2.34 cm AV Vmax:           212.00 cm/s AV Vmean:          162.000 cm/s AV VTI:            0.414 m AV Peak Grad:      18.0 mmHg AV Mean Grad:      12.0 mmHg LVOT Vmax:         141.00 cm/s LVOT Vmean:        99.700 cm/s LVOT VTI:          0.255 m LVOT/AV VTI ratio: 0.62  AORTA Ao Root diam: 3.60 cm Ao Asc diam:  3.60 cm  SHUNTS Systemic VTI:  0.26 m Systemic Diam: 2.20 cm Donato Schultz MD Electronically signed by Donato Schultz MD Signature Date/Time: 11/21/2023/12:28:57 PM    Final    US THORACENTESIS ASP PLEURAL SPACE W/IMG GUIDE  Result Date: 11/21/2023 INDICATION: 78 year old with history of shortness of breath, right pleural effusion. Thoracentesis requested. EXAM: ULTRASOUND GUIDED RIGHT THORACENTESIS MEDICATIONS: 10 mL 1% lidocaine COMPLICATIONS: None immediate. PROCEDURE: An ultrasound guided thoracentesis was thoroughly discussed with the patient and questions answered. The benefits, risks, alternatives and complications were also discussed. The patient understands and wishes to proceed with the procedure. Written consent was obtained. Ultrasound was performed to localize and mark an adequate pocket of fluid in the right chest. The area was then prepped and draped in the normal sterile fashion. 1% Lidocaine was used for local anesthesia. Under ultrasound guidance a 6 Fr Safe-T-Centesis catheter was introduced. Thoracentesis was performed. The catheter was removed and a dressing applied. FINDINGS: A total of approximately 400 mL of amber fluid was removed. Samples were sent to the laboratory as requested by the clinical team. IMPRESSION: Successful ultrasound guided right thoracentesis yielding 400 mL of pleural fluid. Performed by: Loyce Dys PA-C Electronically Signed   By: Malachy Moan  M.D.   On: 11/21/2023 12:10   DG Chest 1 View Result Date: 11/21/2023 CLINICAL DATA:  Right pleural effusion, post thoracentesis. EXAM: CHEST  1 VIEW COMPARISON:  11/20/2023 and CT chest 11/20/2023. FINDINGS: Trachea is midline. Heart size stable. Thoracic aorta is calcified. Lungs are low in volume with an elevated right hemidiaphragm. No pneumothorax status post right thoracentesis. Minimal streaky volume loss in the right perihilar region. Lungs are otherwise clear. No residual pleural effusions. IMPRESSION: 1. No pneumothorax status post right thoracentesis. No appreciable residual right pleural fluid. 2. Low lung volumes with streaky right perihilar atelectasis. Electronically Signed   By: Leanna Battles M.D.   On: 11/21/2023 11:05   CT CHEST ABDOMEN PELVIS WO CONTRAST Result Date: 11/20/2023 CLINICAL DATA:  Rib fracture suspected, traumatic EXAM: CT CHEST, ABDOMEN AND PELVIS WITHOUT CONTRAST TECHNIQUE: Multidetector CT imaging of the chest, abdomen and pelvis was performed following the standard protocol without IV contrast. RADIATION DOSE REDUCTION: This exam was performed according to the departmental dose-optimization program which includes automated exposure control, adjustment of the mA and/or kV according to patient size and/or use of iterative reconstruction technique. COMPARISON:  None Available. FINDINGS: Evaluation is generally limited by lack of intravenous contrast and breath motion artifact, particularly in the lower abdomen, as well as streak artifact from patient arm positioning CT CHEST FINDINGS Cardiovascular: Aortic atherosclerosis. Cardiomegaly. Left coronary artery calcifications. No pericardial effusion. Mediastinum/Nodes: No enlarged mediastinal, hilar, or axillary lymph nodes. Thyroid gland, trachea, and esophagus demonstrate no significant findings. Lungs/Pleura: Moderate, loculated right pleural effusion with associated atelectasis or consolidation. Musculoskeletal: No chest  wall abnormality. No obvious fractures. Assessment for rib fracture is significantly limited by pervasive breath motion artifact throughout the chest. CT ABDOMEN PELVIS FINDINGS Hepatobiliary: No focal liver abnormality is seen. Status post cholecystectomy. No biliary dilatation. Pancreas: Unremarkable. No pancreatic ductal dilatation or surrounding inflammatory changes. Spleen: Normal in size without significant abnormality. Adrenals/Urinary Tract: Adrenal glands are unremarkable. 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 No hydronephrosis (series 5, image 70). Bladder is unremarkable. Stomach/Bowel: Stomach is within normal limits. Appendix appears normal. No evidence of bowel wall thickening, distention, or inflammatory changes. Large stool ball in the rectum. Vascular/Lymphatic: Aortic atherosclerosis. 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 No enlarged abdominal or pelvic lymph nodes. Reproductive: Prostatomegaly. Other: No abdominal wall hernia.  Anasarca.  No ascites. Musculoskeletal: No acute osseous findings. IMPRESSION: 1. Evaluation is generally  limited by lack of intravenous contrast and breath motion artifact, particularly in the lower abdomen, as well as streak artifact from patient arm positioning. 2. No obvious fractures. Assessment for rib fracture is significantly limited by pervasive breath motion artifact throughout the chest. 3. Moderate, loculated right pleural effusion with associated atelectasis or consolidation. 4. 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 No hydronephrosis. 5. Large stool ball in the rectum. 6. Prostatomegaly. 7. Coronary artery disease. Aortic Atherosclerosis (ICD10-I70.0). Electronically Signed   By: Jearld Lesch M.D.   On: 11/20/2023 20:56   CT  Head Wo Contrast Result Date: 11/20/2023 CLINICAL DATA:  Recent fall with headaches and neck pain, initial encounter EXAM: CT HEAD WITHOUT CONTRAST CT CERVICAL SPINE WITHOUT CONTRAST TECHNIQUE: Multidetector CT imaging of the head and cervical spine was performed following the standard protocol without intravenous contrast. Multiplanar CT image reconstructions of the cervical spine were also generated. RADIATION DOSE REDUCTION: This exam was performed according to the departmental dose-optimization program which includes automated exposure control, adjustment of the mA and/or kV according to patient size and/or use of iterative reconstruction technique. COMPARISON:  04/29/2023 FINDINGS: CT HEAD FINDINGS Brain: No evidence of acute infarction, hemorrhage, hydrocephalus, extra-axial collection or mass lesion/mass effect. Mild atrophic and chronic white matter ischemic changes are noted. Vascular: No hyperdense vessel or unexpected calcification. Skull: Normal. Negative for fracture or focal lesion. Sinuses/Orbits: No acute finding. Other: None. CT CERVICAL SPINE FINDINGS Alignment: Within normal limits. Skull base and vertebrae: 7 cervical segments are well visualized. Vertebral body height is well maintained. Multilevel osteophytic and facet hypertrophic changes are noted. No acute fracture or acute facet abnormality is noted. The odontoid is within normal limits. Soft tissues and spinal canal: Surrounding soft tissue structures are within normal limits. Disc levels:  Mild disc bulging is noted at C3-4 and C5-6. Upper chest: Pleural effusion is noted on the right Other: None IMPRESSION: CT of the head: No acute intracranial abnormality noted. Chronic atrophic and ischemic changes. CT of the cervical spine: Multilevel degenerative change without acute abnormality. Electronically Signed   By: Alcide Clever M.D.   On: 11/20/2023 20:43   CT Cervical Spine Wo Contrast Result Date: 11/20/2023 CLINICAL DATA:  Recent fall  with headaches and neck pain, initial encounter EXAM: CT HEAD WITHOUT CONTRAST CT CERVICAL SPINE WITHOUT CONTRAST TECHNIQUE: Multidetector CT imaging of the head and cervical spine was performed following the standard protocol without intravenous contrast. Multiplanar CT image reconstructions of the cervical spine were also generated. RADIATION DOSE REDUCTION: This exam was performed according to the departmental dose-optimization program which includes automated exposure control, adjustment of the mA and/or kV according to patient size and/or use of iterative reconstruction technique. COMPARISON:  04/29/2023 FINDINGS: CT HEAD FINDINGS Brain: No evidence of acute infarction, hemorrhage, hydrocephalus, extra-axial collection or mass lesion/mass effect. Mild atrophic and chronic white matter ischemic changes are noted. Vascular: No hyperdense vessel or unexpected calcification. Skull: Normal. Negative for fracture or focal lesion. Sinuses/Orbits: No acute finding. Other: None. CT CERVICAL SPINE FINDINGS Alignment: Within normal limits. Skull base and vertebrae: 7 cervical segments are well visualized. Vertebral body height is well maintained. Multilevel osteophytic and facet hypertrophic changes are noted. No acute fracture or acute facet abnormality is noted. The odontoid is within normal limits. Soft tissues and spinal canal: Surrounding soft tissue structures are within normal limits. Disc levels:  Mild disc bulging is noted at C3-4 and C5-6. Upper chest: Pleural  effusion is noted on the right Other: None IMPRESSION: CT of the head: No acute intracranial abnormality noted. Chronic atrophic and ischemic changes. CT of the cervical spine: Multilevel degenerative change without acute abnormality. Electronically Signed   By: Alcide Clever M.D.   On: 11/20/2023 20:43   DG Pelvis Portable Result Date: 11/20/2023 CLINICAL DATA:  Status post fall. EXAM: PORTABLE PELVIS 1-2 VIEWS COMPARISON:  None Available. FINDINGS:  There is no evidence of pelvic fracture or diastasis. No pelvic bone lesions are seen. IMPRESSION: Negative. Electronically Signed   By: Aram Candela M.D.   On: 11/20/2023 20:26   DG Chest Port 1 View Result Date: 11/20/2023 CLINICAL DATA:  Status post fall. EXAM: PORTABLE CHEST 1 VIEW COMPARISON:  September 30, 2023 FINDINGS: The heart size and mediastinal contours are within normal limits. There is prominence of the central pulmonary vasculature. Low lung volumes are noted with mild atelectatic changes seen within the bilateral lung bases. There is no evidence of focal consolidation, pleural effusion or pneumothorax. The visualized skeletal structures are unremarkable. IMPRESSION: Low lung volumes with pulmonary vascular congestion and mild bibasilar atelectasis. Electronically Signed   By: Aram Candela M.D.   On: 11/20/2023 20:25     Labs:   Basic Metabolic Panel: Recent Labs  Lab 11/29/23 0213 11/30/23 1108 12/01/23 0231 12/02/23 0234 12/03/23 0241 12/03/23 0839  NA 148* 146* 144 141 143  --   K 4.9 5.3* 5.4* 5.4* 5.3*  --   CL 124* 121* 119* 114* 115*  --   CO2 17* 17* 18* 19* 19*  --   GLUCOSE 250* 189* 170* 178* 123*  --   BUN 45* 46* 47* 52* 58*  --   CREATININE 1.90* 1.73* 1.70* 1.69* 1.72*  --   CALCIUM 9.4 9.4 9.3 9.4 9.4  --   MG  --   --  1.8  --   --  1.9   GFR Estimated Creatinine Clearance: 41.3 mL/min (A) (by C-G formula based on SCr of 1.72 mg/dL (H)). Liver Function Tests: Recent Labs  Lab 12/01/23 0231  AST 41  ALT 52*  ALKPHOS 68  BILITOT 0.5  PROT 6.2*  ALBUMIN 2.1*   No results for input(s): "LIPASE", "AMYLASE" in the last 168 hours. No results for input(s): "AMMONIA" in the last 168 hours. Coagulation profile No results for input(s): "INR", "PROTIME" in the last 168 hours.  CBC: Recent Labs  Lab 11/30/23 1108 12/01/23 0231 12/02/23 0234 12/03/23 0241  WBC  --  7.8 7.9 8.2  NEUTROABS  --   --  5.3 5.6  HGB 7.3* 7.1* 7.7* 7.7*  HCT  24.2* 23.4* 25.0* 24.5*  MCV  --  78.0* 77.6* 78.0*  PLT  --  311 341 311   Cardiac Enzymes: No results for input(s): "CKTOTAL", "CKMB", "CKMBINDEX", "TROPONINI" in the last 168 hours. BNP: Invalid input(s): "POCBNP" CBG: Recent Labs  Lab 12/02/23 1621 12/02/23 2011 12/02/23 2357 12/03/23 0534 12/03/23 0711  GLUCAP 170* 167* 157* 128* 134*   D-Dimer No results for input(s): "DDIMER" in the last 72 hours. Hgb A1c No results for input(s): "HGBA1C" in the last 72 hours. Lipid Profile No results for input(s): "CHOL", "HDL", "LDLCALC", "TRIG", "CHOLHDL", "LDLDIRECT" in the last 72 hours. Thyroid function studies No results for input(s): "TSH", "T4TOTAL", "T3FREE", "THYROIDAB" in the last 72 hours.  Invalid input(s): "FREET3" Anemia work up No results for input(s): "VITAMINB12", "FOLATE", "FERRITIN", "TIBC", "IRON", "RETICCTPCT" in the last 72 hours. Microbiology No results found for  this or any previous visit (from the past 240 hours).  Time coordinating discharge: 45 minutes  Signed: Elim Peale  [facility] 12/03/2023, 11:19 AM

## 2023-12-05 LAB — CYCLOSPORINE: Cyclosporine, LabCorp: 88 ng/mL — ABNORMAL LOW (ref 100–400)

## 2023-12-17 IMAGING — DX DG CHEST 2V
2 series · 2 of 2 positions shown · non-contrast
Comparison: 07/04/2021

CLINICAL DATA: Sepsis

EXAM:
CHEST - 2 VIEW

[chest lat]
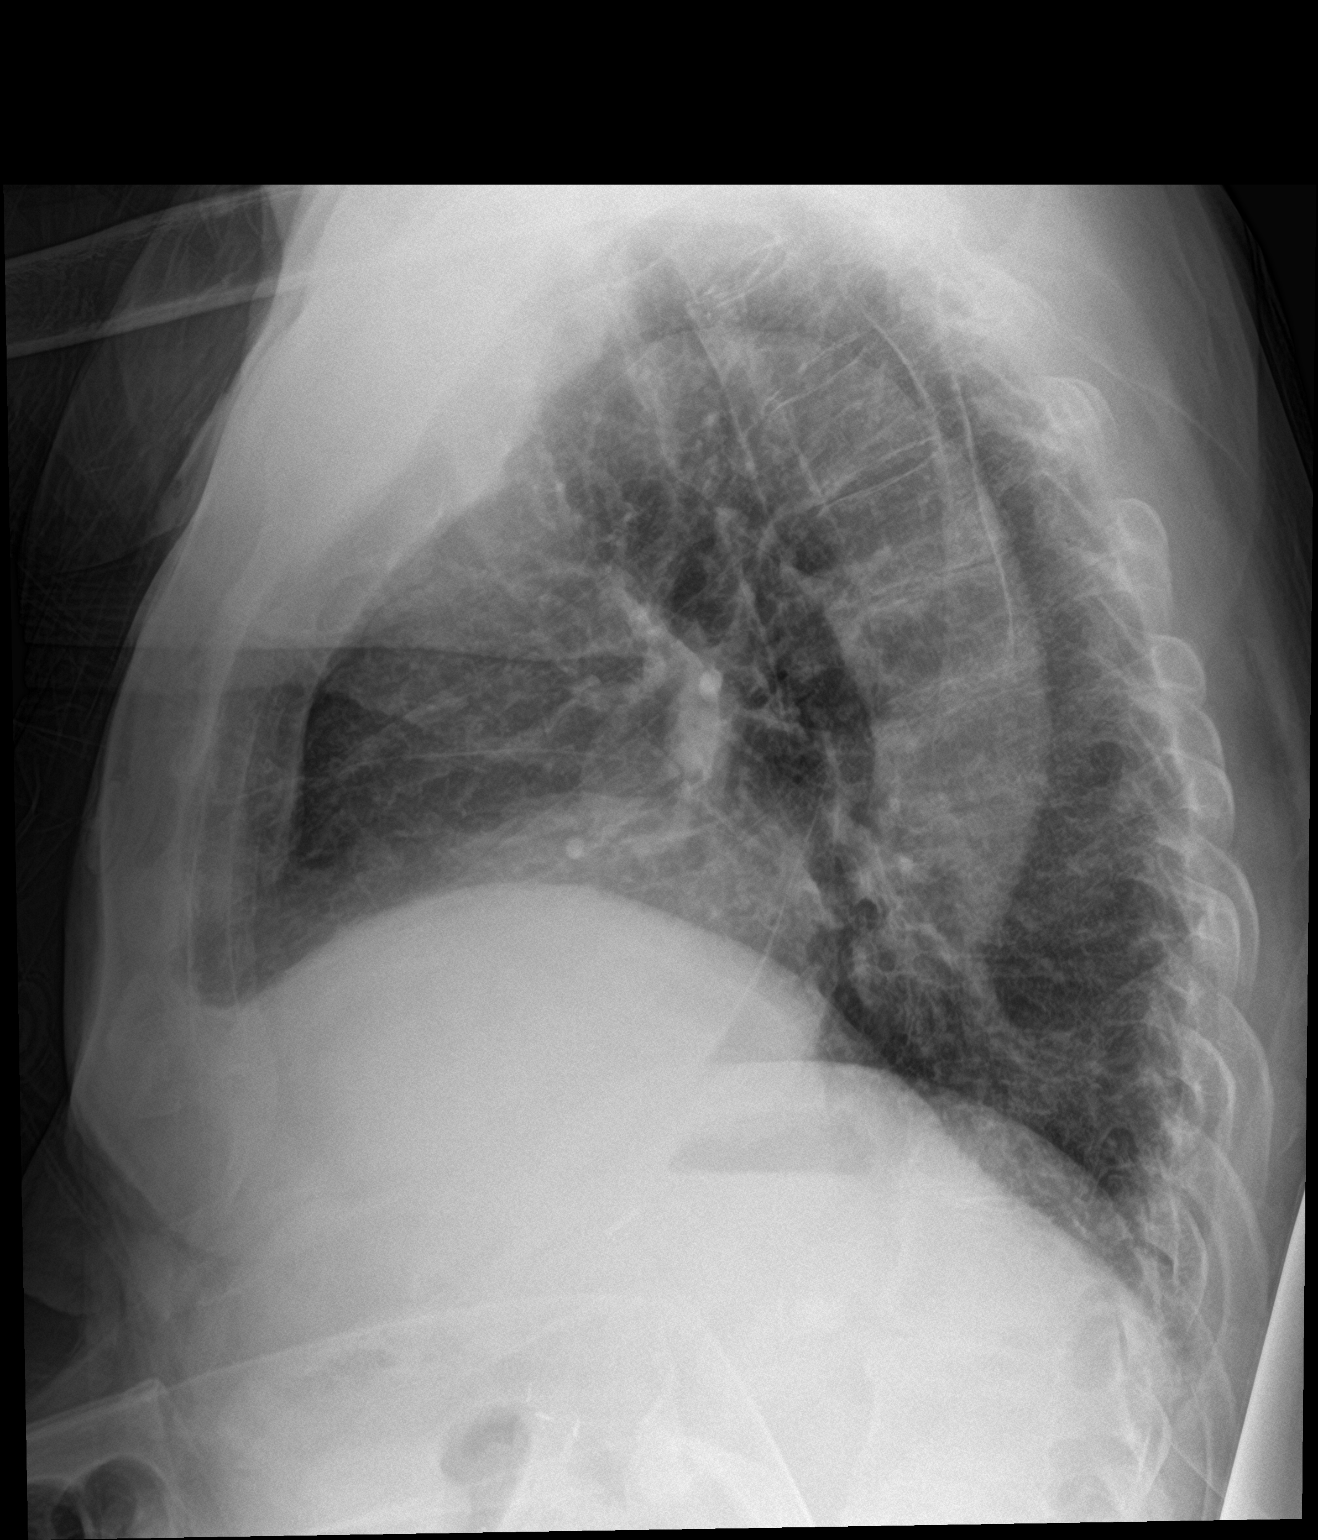

[chest ap]
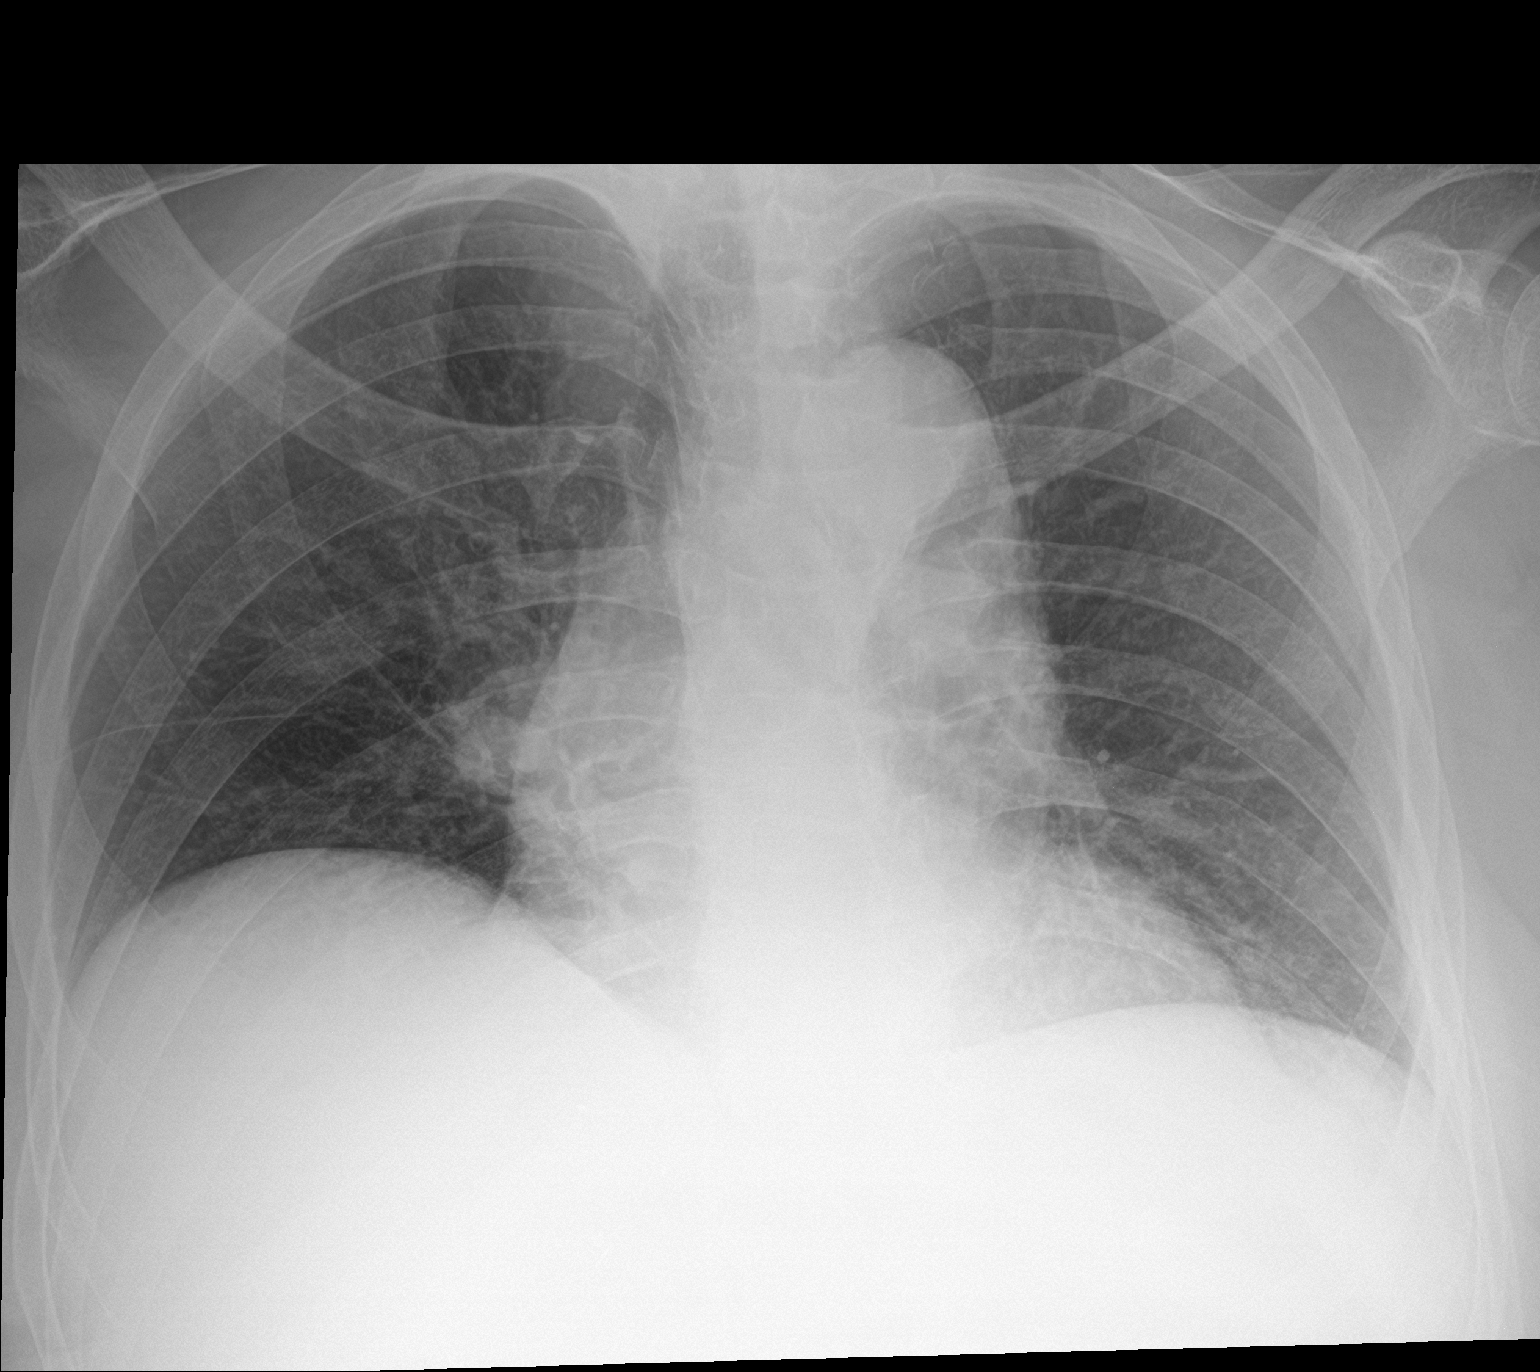

[2 of 2 positions shown; findings below may reference images not displayed]

FINDINGS: Frontal and lateral views of the chest demonstrate a stable cardiac
silhouette. Lung volumes are diminished, without superimposed
airspace disease, effusion, or pneumothorax. No acute bony
abnormalities.
IMPRESSION: 1. Low lung volumes.  No acute airspace disease.

## 2024-01-04 ENCOUNTER — Observation Stay (HOSPITAL_COMMUNITY)
Admission: EM | Admit: 2024-01-04 | Discharge: 2024-01-06 | Disposition: A | Discharge status: Home / Self Care | Attending: Student | Admitting: Student

## 2024-01-04 ENCOUNTER — Emergency Department (HOSPITAL_COMMUNITY)

## 2024-01-04 ENCOUNTER — Observation Stay (HOSPITAL_COMMUNITY)

## 2024-01-04 ENCOUNTER — Other Ambulatory Visit: Payer: Self-pay

## 2024-01-04 ENCOUNTER — Encounter (HOSPITAL_COMMUNITY): Payer: Self-pay

## 2024-01-04 DIAGNOSIS — L8990 Pressure ulcer of unspecified site, unspecified stage: Secondary | ICD-10-CM

## 2024-01-04 DIAGNOSIS — Z79899 Other long term (current) drug therapy: Secondary | ICD-10-CM | POA: Diagnosis not present

## 2024-01-04 DIAGNOSIS — R569 Unspecified convulsions: Secondary | ICD-10-CM

## 2024-01-04 DIAGNOSIS — M1990 Unspecified osteoarthritis, unspecified site: Secondary | ICD-10-CM | POA: Diagnosis present

## 2024-01-04 DIAGNOSIS — M1712 Unilateral primary osteoarthritis, left knee: Secondary | ICD-10-CM | POA: Diagnosis not present

## 2024-01-04 DIAGNOSIS — Z7901 Long term (current) use of anticoagulants: Secondary | ICD-10-CM | POA: Diagnosis not present

## 2024-01-04 DIAGNOSIS — L89153 Pressure ulcer of sacral region, stage 3: Secondary | ICD-10-CM | POA: Diagnosis not present

## 2024-01-04 DIAGNOSIS — T17908A Unspecified foreign body in respiratory tract, part unspecified causing other injury, initial encounter: Secondary | ICD-10-CM

## 2024-01-04 DIAGNOSIS — N320 Bladder-neck obstruction: Secondary | ICD-10-CM

## 2024-01-04 DIAGNOSIS — F03C0 Unspecified dementia, severe, without behavioral disturbance, psychotic disturbance, mood disturbance, and anxiety: Secondary | ICD-10-CM | POA: Diagnosis not present

## 2024-01-04 DIAGNOSIS — Z86718 Personal history of other venous thrombosis and embolism: Secondary | ICD-10-CM | POA: Insufficient documentation

## 2024-01-04 DIAGNOSIS — N1832 Chronic kidney disease, stage 3b: Secondary | ICD-10-CM | POA: Diagnosis not present

## 2024-01-04 DIAGNOSIS — Z1152 Encounter for screening for COVID-19: Secondary | ICD-10-CM | POA: Diagnosis not present

## 2024-01-04 DIAGNOSIS — Z87891 Personal history of nicotine dependence: Secondary | ICD-10-CM | POA: Diagnosis not present

## 2024-01-04 DIAGNOSIS — L89620 Pressure ulcer of left heel, unstageable: Secondary | ICD-10-CM | POA: Insufficient documentation

## 2024-01-04 DIAGNOSIS — R1312 Dysphagia, oropharyngeal phase: Secondary | ICD-10-CM | POA: Diagnosis not present

## 2024-01-04 DIAGNOSIS — M25462 Effusion, left knee: Principal | ICD-10-CM | POA: Insufficient documentation

## 2024-01-04 DIAGNOSIS — L89152 Pressure ulcer of sacral region, stage 2: Secondary | ICD-10-CM | POA: Insufficient documentation

## 2024-01-04 DIAGNOSIS — N189 Chronic kidney disease, unspecified: Secondary | ICD-10-CM | POA: Diagnosis present

## 2024-01-04 DIAGNOSIS — I129 Hypertensive chronic kidney disease with stage 1 through stage 4 chronic kidney disease, or unspecified chronic kidney disease: Secondary | ICD-10-CM | POA: Diagnosis not present

## 2024-01-04 DIAGNOSIS — Z940 Kidney transplant status: Secondary | ICD-10-CM | POA: Diagnosis not present

## 2024-01-04 DIAGNOSIS — Z944 Liver transplant status: Secondary | ICD-10-CM

## 2024-01-04 DIAGNOSIS — I10 Essential (primary) hypertension: Secondary | ICD-10-CM | POA: Diagnosis present

## 2024-01-04 DIAGNOSIS — Z794 Long term (current) use of insulin: Secondary | ICD-10-CM | POA: Insufficient documentation

## 2024-01-04 DIAGNOSIS — N179 Acute kidney failure, unspecified: Secondary | ICD-10-CM | POA: Diagnosis present

## 2024-01-04 DIAGNOSIS — M25562 Pain in left knee: Principal | ICD-10-CM

## 2024-01-04 DIAGNOSIS — Z8673 Personal history of transient ischemic attack (TIA), and cerebral infarction without residual deficits: Secondary | ICD-10-CM | POA: Insufficient documentation

## 2024-01-04 DIAGNOSIS — I4891 Unspecified atrial fibrillation: Secondary | ICD-10-CM | POA: Diagnosis present

## 2024-01-04 DIAGNOSIS — E1122 Type 2 diabetes mellitus with diabetic chronic kidney disease: Secondary | ICD-10-CM | POA: Insufficient documentation

## 2024-01-04 LAB — URINALYSIS, W/ REFLEX TO CULTURE (INFECTION SUSPECTED)
Bilirubin Urine: NEGATIVE
Glucose, UA: NEGATIVE mg/dL
Hgb urine dipstick: NEGATIVE
Ketones, ur: NEGATIVE mg/dL
Leukocytes,Ua: NEGATIVE
Nitrite: NEGATIVE
Protein, ur: NEGATIVE mg/dL
Specific Gravity, Urine: 1.01 (ref 1.005–1.030)
pH: 6 (ref 5.0–8.0)

## 2024-01-04 LAB — COMPREHENSIVE METABOLIC PANEL WITH GFR
ALT: 13 U/L (ref 0–44)
AST: 13 U/L — ABNORMAL LOW (ref 15–41)
Albumin: 2.5 g/dL — ABNORMAL LOW (ref 3.5–5.0)
Alkaline Phosphatase: 92 U/L (ref 38–126)
Anion gap: 8 (ref 5–15)
BUN: 49 mg/dL — ABNORMAL HIGH (ref 8–23)
CO2: 18 mmol/L — ABNORMAL LOW (ref 22–32)
Calcium: 10.5 mg/dL — ABNORMAL HIGH (ref 8.9–10.3)
Chloride: 119 mmol/L — ABNORMAL HIGH (ref 98–111)
Creatinine, Ser: 2.01 mg/dL — ABNORMAL HIGH (ref 0.61–1.24)
GFR, Estimated: 34 mL/min — ABNORMAL LOW (ref >60)
Glucose, Bld: 159 mg/dL — ABNORMAL HIGH (ref 70–99)
Potassium: 4.4 mmol/L (ref 3.5–5.1)
Sodium: 145 mmol/L (ref 135–145)
Total Bilirubin: 0.7 mg/dL (ref 0.0–1.2)
Total Protein: 6.8 g/dL (ref 6.5–8.1)

## 2024-01-04 LAB — CBC WITH DIFFERENTIAL/PLATELET
Abs Immature Granulocytes: 0.03 10*3/uL (ref 0.00–0.07)
Basophils Absolute: 0.1 10*3/uL (ref 0.0–0.1)
Basophils Relative: 1 %
Eosinophils Absolute: 0.2 10*3/uL (ref 0.0–0.5)
Eosinophils Relative: 3 %
HCT: 29.8 % — ABNORMAL LOW (ref 39.0–52.0)
Hemoglobin: 8.8 g/dL — ABNORMAL LOW (ref 13.0–17.0)
Immature Granulocytes: 1 %
Lymphocytes Relative: 16 %
Lymphs Abs: 0.9 10*3/uL (ref 0.7–4.0)
MCH: 24.2 pg — ABNORMAL LOW (ref 26.0–34.0)
MCHC: 29.5 g/dL — ABNORMAL LOW (ref 30.0–36.0)
MCV: 81.9 fL (ref 80.0–100.0)
Monocytes Absolute: 0.6 10*3/uL (ref 0.1–1.0)
Monocytes Relative: 11 %
Neutro Abs: 4.2 10*3/uL (ref 1.7–7.7)
Neutrophils Relative %: 68 %
Platelets: 369 10*3/uL (ref 150–400)
RBC: 3.64 MIL/uL — ABNORMAL LOW (ref 4.22–5.81)
RDW: 17.1 % — ABNORMAL HIGH (ref 11.5–15.5)
WBC: 6 10*3/uL (ref 4.0–10.5)
nRBC: 0 % (ref 0.0–0.2)

## 2024-01-04 LAB — I-STAT CG4 LACTIC ACID, ED
Lactic Acid, Venous: 1.2 mmol/L (ref 0.5–1.9)
Lactic Acid, Venous: >15 mmol/L (ref 0.5–1.9)
Lactic Acid, Venous: 1.1 mmol/L (ref 0.5–1.9)

## 2024-01-04 LAB — RESP PANEL BY RT-PCR (RSV, FLU A&B, COVID)  RVPGX2
Influenza A by PCR: NEGATIVE
Influenza B by PCR: NEGATIVE
Resp Syncytial Virus by PCR: NEGATIVE
SARS Coronavirus 2 by RT PCR: NEGATIVE

## 2024-01-04 LAB — PROTIME-INR
INR: 1.7 — ABNORMAL HIGH (ref 0.8–1.2)
Prothrombin Time: 20.1 s — ABNORMAL HIGH (ref 11.4–15.2)

## 2024-01-04 MED ORDER — VITAMIN D 25 MCG (1000 UNIT) PO TABS
2000.0000 [IU] | ORAL_TABLET | Freq: Every day | ORAL | Status: DC
  Administered 2024-01-05: 2000 [IU] via ORAL
  Filled 2024-01-04: qty 2

## 2024-01-04 MED ORDER — LEVETIRACETAM 750 MG PO TABS
750.0000 mg | ORAL_TABLET | Freq: Two times a day (BID) | ORAL | Status: DC
  Administered 2024-01-05 – 2024-01-06 (×4): 750 mg via ORAL
  Filled 2024-01-04 (×5): qty 1

## 2024-01-04 MED ORDER — LACTATED RINGERS IV BOLUS (SEPSIS)
1000.0000 mL | Freq: Once | INTRAVENOUS | Status: AC
  Administered 2024-01-04: 1000 mL via INTRAVENOUS

## 2024-01-04 MED ORDER — ACETAMINOPHEN 650 MG RE SUPP: 650.0000 mg | Freq: Four times a day (QID) | RECTAL | Status: DC | PRN

## 2024-01-04 MED ORDER — INSULIN GLARGINE-YFGN 100 UNIT/ML ~~LOC~~ SOLN
6.0000 [IU] | Freq: Every day | SUBCUTANEOUS | Status: DC
  Administered 2024-01-05 (×2): 6 [IU] via SUBCUTANEOUS
  Filled 2024-01-04 (×3): qty 0.06

## 2024-01-04 MED ORDER — CYCLOSPORINE MODIFIED (NEORAL) 100 MG/ML PO SOLN
130.0000 mg | Freq: Two times a day (BID) | ORAL | Status: DC
  Administered 2024-01-05 – 2024-01-06 (×4): 130 mg via ORAL
  Filled 2024-01-04 (×5): qty 1.3

## 2024-01-04 MED ORDER — CALCITRIOL 0.25 MCG PO CAPS: 0.2500 ug | ORAL_CAPSULE | Freq: Every day | ORAL | Status: DC

## 2024-01-04 MED ORDER — INSULIN ASPART 100 UNIT/ML IJ SOLN
0.0000 [IU] | Freq: Three times a day (TID) | INTRAMUSCULAR | Status: DC
  Administered 2024-01-05: 1 [IU] via SUBCUTANEOUS

## 2024-01-04 MED ORDER — MIRTAZAPINE 15 MG PO TABS
15.0000 mg | ORAL_TABLET | Freq: Every day | ORAL | Status: DC
  Administered 2024-01-05 (×2): 15 mg via ORAL
  Filled 2024-01-04 (×2): qty 1

## 2024-01-04 MED ORDER — ALBUTEROL SULFATE (2.5 MG/3ML) 0.083% IN NEBU: 3.0000 mL | INHALATION_SOLUTION | Freq: Four times a day (QID) | RESPIRATORY_TRACT | Status: DC | PRN

## 2024-01-04 MED ORDER — TRAMADOL HCL 50 MG PO TABS: 25.0000 mg | ORAL_TABLET | Freq: Every evening | ORAL | Status: DC | PRN

## 2024-01-04 MED ORDER — ENOXAPARIN SODIUM 40 MG/0.4ML IJ SOSY
40.0000 mg | PREFILLED_SYRINGE | INTRAMUSCULAR | Status: DC
  Administered 2024-01-05: 40 mg via SUBCUTANEOUS
  Filled 2024-01-04: qty 0.4

## 2024-01-04 MED ORDER — LIDOCAINE-EPINEPHRINE (PF) 2 %-1:200000 IJ SOLN
10.0000 mL | Freq: Once | INTRAMUSCULAR | Status: DC
  Filled 2024-01-04: qty 20

## 2024-01-04 MED ORDER — ACETAMINOPHEN 325 MG PO TABS: 650.0000 mg | ORAL_TABLET | Freq: Four times a day (QID) | ORAL | Status: DC | PRN

## 2024-01-04 MED ORDER — MYCOPHENOLATE SODIUM 180 MG PO TBEC
180.0000 mg | DELAYED_RELEASE_TABLET | Freq: Two times a day (BID) | ORAL | Status: DC
  Administered 2024-01-05 – 2024-01-06 (×4): 180 mg via ORAL
  Filled 2024-01-04 (×5): qty 1

## 2024-01-04 MED ORDER — FINASTERIDE 5 MG PO TABS
5.0000 mg | ORAL_TABLET | Freq: Every day | ORAL | Status: DC
  Administered 2024-01-05 (×2): 5 mg via ORAL
  Filled 2024-01-04 (×2): qty 1

## 2024-01-04 MED ORDER — PANTOPRAZOLE SODIUM 40 MG PO TBEC
40.0000 mg | DELAYED_RELEASE_TABLET | Freq: Every day | ORAL | Status: DC
  Administered 2024-01-05 – 2024-01-06 (×2): 40 mg via ORAL
  Filled 2024-01-04 (×2): qty 1

## 2024-01-04 MED ORDER — SODIUM CHLORIDE 0.9% FLUSH
3.0000 mL | Freq: Two times a day (BID) | INTRAVENOUS | Status: DC
  Administered 2024-01-05 – 2024-01-06 (×4): 3 mL via INTRAVENOUS

## 2024-01-04 MED ORDER — ENSURE ENLIVE PO LIQD
237.0000 mL | Freq: Two times a day (BID) | ORAL | Status: DC
  Administered 2024-01-05 – 2024-01-06 (×3): 237 mL via ORAL

## 2024-01-04 MED ORDER — VANCOMYCIN HCL IN DEXTROSE 1-5 GM/200ML-% IV SOLN
1000.0000 mg | INTRAVENOUS | Status: DC
  Administered 2024-01-05 (×2): 1000 mg via INTRAVENOUS
  Filled 2024-01-04 (×2): qty 200

## 2024-01-04 MED ORDER — INSULIN ASPART 100 UNIT/ML IJ SOLN: 0.0000 [IU] | Freq: Every day | INTRAMUSCULAR | Status: DC

## 2024-01-04 MED ORDER — SODIUM ZIRCONIUM CYCLOSILICATE 10 G PO PACK
10.0000 g | PACK | Freq: Every day | ORAL | Status: DC
  Administered 2024-01-05 – 2024-01-06 (×2): 10 g via ORAL
  Filled 2024-01-04 (×2): qty 1

## 2024-01-04 MED ORDER — SODIUM CHLORIDE 0.9 % IV SOLN: INTRAVENOUS | Status: DC

## 2024-01-04 MED ORDER — TAMSULOSIN HCL 0.4 MG PO CAPS
0.4000 mg | ORAL_CAPSULE | Freq: Every day | ORAL | Status: DC
  Administered 2024-01-05 – 2024-01-06 (×2): 0.4 mg via ORAL
  Filled 2024-01-04 (×2): qty 1

## 2024-01-04 MED ORDER — POLYETHYLENE GLYCOL 3350 17 G PO PACK: 17.0000 g | PACK | Freq: Every day | ORAL | Status: DC | PRN

## 2024-01-04 MED ORDER — DARBEPOETIN ALFA 60 MCG/0.3ML IJ SOSY
60.0000 ug | PREFILLED_SYRINGE | INTRAMUSCULAR | Status: DC
Start: 2024-01-09 — End: 2024-01-06

## 2024-01-04 MED ORDER — AMLODIPINE BESYLATE 5 MG PO TABS: 5.0000 mg | ORAL_TABLET | Freq: Every day | ORAL | Status: DC

## 2024-01-04 NOTE — Assessment & Plan Note (Signed)
 Continue with mycophenolate , LFTs appear to be at baseline.  Mycophenolate  levels.  Patient had liver and  kidney transplant done at [facility]

## 2024-01-04 NOTE — H&P (Signed)
 History and Physical    Patient: Gilbert Wilson FAO:[id number] DOB: 03-04-1946 DOA: 01/04/2024 DOS: the patient was seen and examined on 01/04/2024 PCP: [facility]  Patient coming from: Home  Chief Complaint:  Chief Complaint  Patient presents with   Weakness   Code Sepsis   HPI: HPI principally from wife at bedside, ER signout and record review.   Gilbert Wilson is a 78 y.o. male with medical history significant of known dementia.  Patient does not talk at baseline.  Patient is also high aspiration risk and utilizes modified diet.  Patient further has a history of remote stroke with right upper extremity plegia.  And bedbound status.  Patient was in his usual state of health till about earlier this morning when patient was noted to have left knee area swelling.  There is no report of any recent trauma.  Patient was also noted to be guarding/using the lower extremity less.  There is no report of patient having any rigors fevers vomiting diarrhea or rash on his skin or erythema.  Patient is brought to [facility], ER.  Per ER report, patient has been evaluated by orthopedics and plan is to patient have a left knee arthrocentesis done.  Medical evaluation is sought for overnight observation.   Review of Systems: unable to review all systems due to the inability of the patient to answer questions. 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 His  smoking use included cigarettes. He has never used smokeless tobacco. He reports that he does not drink alcohol and does not use drugs.  Allergies  Allergen Reactions   Linezolid Other (See Comments)    Seizures   Levaquin [Levofloxacin] Itching   Lisinopril Cough    Family History  Problem Relation Age of Onset   Hypertension Mother    Diabetes Mother    Diabetes Father     Prior to Admission medications   Medication Sig Start Date End Date Taking? Authorizing Provider  acetaminophen  (TYLENOL ) 500 MG tablet Take 1 tablet (500 mg total) by mouth every 6 (six) hours as needed for moderate pain (pain score 4-6), headache or fever. 12/03/23  Yes Dahal, Aminta Baldy, MD  albuterol  (VENTOLIN  HFA) 108 (90 Base) MCG/ACT inhaler Inhale 2 puffs into the lungs every 6 (six) hours as needed for wheezing or shortness of breath.   Yes [provider]  amLODipine  (NORVASC ) 5 MG tablet Take 1 tablet (5 mg total) by mouth daily. 12/03/23  Yes Dahal, Aminta Baldy, MD  apixaban  (ELIQUIS ) 5 MG TABS tablet Complete 7 days of initiation with 10mg  BID on 12/05/2023.  Continue family gram twice daily after that 12/03/23  Yes Dahal, Aminta Baldy, MD  ascorbic acid (VITAMIN C) 250 MG tablet Take 250 mg by mouth at bedtime. Take 1 tablet by mouth with the 325mg  iron tablet.  Yes [provider]  calcitRIOL  (ROCALTROL ) 0.25 MCG capsule Take 0.25 mcg by mouth daily.   Yes [provider]  Cholecalciferol 50 MCG (2000 UT) CAPS Take 1 capsule by mouth daily. Take with 0.4mg  of Tamsulosin    Yes [provider]  cycloSPORINE  (NEORAL ) 100 MG/ML microemulsion solution Take 130 mg by mouth 2 (two) times daily. 1.38ml 12/28/23  Yes [provider]  epoetin alfa (EPOGEN) 10000 UNIT/ML injection Inject 10,000 Units into the skin once. Give every 10 days   Yes [provider]  ferrous sulfate  325 (65 FE) MG EC tablet Take 325 mg by mouth at bedtime. Take 1 tablet by mouth with the 250mg  vitamin c  tablet.   Yes [provider]  finasteride  (PROSCAR ) 5 MG tablet Take 5 mg by mouth at bedtime.   Yes [provider]  furosemide  (LASIX ) 20 MG tablet Take 1 tablet (20 mg total) by mouth daily as needed for edema. 12/03/23  Yes Dahal, Aminta Baldy, MD  insulin  glargine (LANTUS SOLOSTAR) 100 UNIT/ML Solostar Pen Inject 0-6 Units into the skin at bedtime. If BS>130-Take 6 units, Increase 1 unit for every 10 mg/dl above 829.   Yes [provider]  levETIRAcetam  (KEPPRA ) 750 MG tablet Take 1 tablet (750 mg total) by mouth 2 (two) times daily. 04/29/23  Yes Dorenda Gandy, MD  mirtazapine  (REMERON ) 15 MG tablet Take 0.5 tablets (7.5 mg total) by mouth at bedtime. Patient taking differently: Take 15 mg by mouth at bedtime. 01/04/22  Yes Kraig Peru, MD  mycophenolate  (MYFORTIC ) 180 MG EC tablet Take 180 mg by mouth 2 (two) times daily.   Yes [provider]  Nutritional Supplements (ENSURE HIGH PROTEIN) LIQD Take 237 mLs by mouth 2 (two) times daily. 10/12/23  Yes [provider]  omeprazole (PRILOSEC) 20 MG capsule Take 20 mg by mouth at bedtime.   Yes [provider]  sodium zirconium cyclosilicate  (LOKELMA ) 10 g PACK packet Take 10 g by mouth daily. 12/03/23  Yes Dahal, Aminta Baldy, MD  tamsulosin  (FLOMAX ) 0.4 MG CAPS capsule Take 0.4 mg by mouth daily. Take 1 tablet by mouth with Vit D3 2000iu   Yes [provider]  traMADol (ULTRAM) 50 MG tablet Take 25-50 mg by mouth at bedtime as needed for moderate pain (pain score 4-6). 12/22/23  Yes [provider]    Physical Exam: Vitals:   01/04/24 2100 01/04/24 2115 01/04/24 2130 01/04/24 2145  BP: (!) 149/62 121/66 109/63 135/63  Pulse: 82 80 80 80  Resp: 20 15 (!) 21 (!) 23  Temp:  98.3 F (36.8 C)    TempSrc:  Axillary    SpO2: 100% 100% 100% 100%  Weight:      Height:       General: Patient is alert and awake, however alternates with sleeping.  Patient does not interact, makes  transient eye contact. Respiratory exam: Poor excursion, patient does not follow directions for deep breathing, bilateral air entry vesicular Cardiovascular exam S1-S2 normal Abdomen all quadrants soft nontender Extremities warm no edema left knee swelling is obvious, no skin changes, warmth is noted.  Appears to be tender to direct palpation  Data Reviewed:  Labs on Admission:  Results for orders placed or performed during the hospital encounter of 01/04/24 (from the past 24 hours)  Comprehensive metabolic panel     Status: Abnormal   Collection Time: 01/04/24  5:58 PM  Result Value Ref Range   Sodium 145 135 - 145 mmol/L  Potassium 4.4 3.5 - 5.1 mmol/L   Chloride 119 (H) 98 - 111 mmol/L   CO2 18 (L) 22 - 32 mmol/L   Glucose, Bld 159 (H) 70 - 99 mg/dL   BUN 49 (H) 8 - 23 mg/dL   Creatinine, Ser 0.98 (H) 0.61 - 1.24 mg/dL   Calcium  10.5 (H) 8.9 - 10.3 mg/dL   Total Protein 6.8 6.5 - 8.1 g/dL   Albumin 2.5 (L) 3.5 - 5.0 g/dL   AST 13 (L) 15 - 41 U/L   ALT 13 0 - 44 U/L   Alkaline Phosphatase 92 38 - 126 U/L   Total Bilirubin 0.7 0.0 - 1.2 mg/dL   GFR, Estimated 34 (L) >60 mL/min   Anion gap 8 5 - 15  CBC with Differential     Status: Abnormal   Collection Time: 01/04/24  5:58 PM  Result Value Ref Range   WBC 6.0 4.0 - 10.5 K/uL   RBC 3.64 (L) 4.22 - 5.81 MIL/uL   Hemoglobin 8.8 (L) 13.0 - 17.0 g/dL   HCT 11.9 (L) 14.7 - 82.9 %   MCV 81.9 80.0 - 100.0 fL   MCH 24.2 (L) 26.0 - 34.0 pg   MCHC 29.5 (L) 30.0 - 36.0 g/dL   RDW 56.2 (H) 13.0 - 86.5 %   Platelets 369 150 - 400 K/uL   nRBC 0.0 0.0 - 0.2 %   Neutrophils Relative % 68 %   Neutro Abs 4.2 1.7 - 7.7 K/uL   Lymphocytes Relative 16 %   Lymphs Abs 0.9 0.7 - 4.0 K/uL   Monocytes Relative 11 %   Monocytes Absolute 0.6 0.1 - 1.0 K/uL   Eosinophils Relative 3 %   Eosinophils Absolute 0.2 0.0 - 0.5 K/uL   Basophils Relative 1 %   Basophils Absolute 0.1 0.0 - 0.1 K/uL   Immature Granulocytes 1 %   Abs Immature  Granulocytes 0.03 0.00 - 0.07 K/uL  Protime-INR     Status: Abnormal   Collection Time: 01/04/24  5:58 PM  Result Value Ref Range   Prothrombin Time 20.1 (H) 11.4 - 15.2 seconds   INR 1.7 (H) 0.8 - 1.2  I-Stat Lactic Acid, ED     Status: None   Collection Time: 01/04/24  6:16 PM  Result Value Ref Range   Lactic Acid, Venous 1.2 0.5 - 1.9 mmol/L  Resp panel by RT-PCR (RSV, Flu A&B, Covid) Anterior Nasal Swab     Status: None   Collection Time: 01/04/24  6:58 PM   Specimen: Anterior Nasal Swab  Result Value Ref Range   SARS Coronavirus 2 by RT PCR NEGATIVE NEGATIVE   Influenza A by PCR NEGATIVE NEGATIVE   Influenza B by PCR NEGATIVE NEGATIVE   Resp Syncytial Virus by PCR NEGATIVE NEGATIVE  I-Stat Lactic Acid, ED     Status: None   Collection Time: 01/04/24  8:45 PM  Result Value Ref Range   Lactic Acid, Venous 1.1 0.5 - 1.9 mmol/L  Urinalysis, w/ Reflex to Culture (Infection Suspected) -Urine, Catheterized     Status: None   Collection Time: 01/04/24  9:53 PM  Result Value Ref Range   Specimen Source URINE, CATHETERIZED    Color, Urine YELLOW YELLOW   APPearance CLEAR CLEAR   Specific Gravity, Urine 1.010 1.005 - 1.030   pH 6.0 5.0 - 8.0   Glucose, UA NEGATIVE NEGATIVE mg/dL   Hgb urine dipstick NEGATIVE NEGATIVE   Bilirubin Urine NEGATIVE NEGATIVE   Ketones, ur NEGATIVE  NEGATIVE mg/dL   Protein, ur NEGATIVE NEGATIVE mg/dL   Nitrite NEGATIVE NEGATIVE   Leukocytes,Ua NEGATIVE NEGATIVE   Basic Metabolic Panel: Recent Labs  Lab 01/04/24 1758  NA 145  K 4.4  CL 119*  CO2 18*  GLUCOSE 159*  BUN 49*  CREATININE 2.01*  CALCIUM  10.5*   Liver Function Tests: Recent Labs  Lab 01/04/24 1758  AST 13*  ALT 13  ALKPHOS 92  BILITOT 0.7  PROT 6.8  ALBUMIN 2.5*   No results for input(s): "LIPASE", "AMYLASE" in the last 168 hours. No results for input(s): "AMMONIA" in the last 168 hours. CBC: Recent Labs  Lab 01/04/24 1758  WBC 6.0  NEUTROABS 4.2  HGB 8.8*  HCT  29.8*  MCV 81.9  PLT 369   Cardiac Enzymes: No results for input(s): "CKTOTAL", "CKMB", "CKMBINDEX", "TROPONINIHS" in the last 168 hours.  BNP (last 3 results) No results for input(s): "PROBNP" in the last 8760 hours. CBG: No results for input(s): "GLUCAP" in the last 168 hours.  Radiological Exams on Admission:  DG Ankle 2 Views Left Result Date: 01/04/2024 CLINICAL DATA:  Questionable sepsis EXAM: LEFT ANKLE - 2 VIEW COMPARISON:  None Available. FINDINGS: Overlying soft tissue artifact is present. The bones are diffusely osteopenic. There is no acute fracture or dislocation. There are well corticated densities adjacent to the tip of the medial malleolus likely related to old injury erosions are identified. No cortical erosions are identified. Peripheral vascular calcifications are present. IMPRESSION: 1. No acute fracture or dislocation. 2. Diffuse osteopenia. 3. Peripheral vascular disease. Electronically Signed   By: Tyron Gallon M.D.   On: 01/04/2024 18:11   DG Knee Complete 4 Views Left Result Date: 01/04/2024 CLINICAL DATA:  Question sepsis.  Left knee swelling. EXAM: LEFT KNEE - COMPLETE 4+ VIEW COMPARISON:  None Available. FINDINGS: Large joint effusion within the left knee. No acute bony abnormality. Specifically, no fracture, subluxation, or dislocation. Early degenerative changes in the left knee. Diffuse vascular calcifications. IMPRESSION: Early osteoarthritis. Large joint effusion. No acute bony abnormality. Electronically Signed   By: Janeece Mechanic M.D.   On: 01/04/2024 18:09   DG Chest 1 View Result Date: 01/04/2024 CLINICAL DATA:  Sepsis EXAM: CHEST  1 VIEW COMPARISON:  Chest x-ray 11/30/2023 FINDINGS: The heart is enlarged. Lung volumes are low. The lungs are clear. There is no pleural effusion or pneumothorax. No acute fractures are seen. There are surgical clips in the upper abdomen. IMPRESSION: Cardiomegaly. No acute cardiopulmonary process. Electronically Signed   By: Tyron Gallon M.D.   On: 01/04/2024 18:04   DG Hip Unilat W or Wo Pelvis 2-3 Views Left Result Date: 01/04/2024 CLINICAL DATA:  Questionable sepsis EXAM: DG HIP (WITH OR WITHOUT PELVIS) 2-3V LEFT COMPARISON:  None Available. FINDINGS: The bones are diffusely osteopenic. There is no evidence of hip fracture or dislocation. Joint spaces are maintained. There is a large stool ball within the rectum. Peripheral vascular calcifications are present. IMPRESSION: 1. No acute fracture or dislocation. 2. Large stool ball within the rectum worrisome for impaction. Electronically Signed   By: Tyron Gallon M.D.   On: 01/04/2024 18:03     No intake/output data recorded. Total I/O In: -  Out: 500 [Urine:500]       Assessment and Plan: * Arthritis Left knee, acute.  No reported history of trauma and imaging negative for fracture at this time.  Given patient's immunocompromise, high concern for infection.  Blood cultures have been already drawn.  Orthopedics  is planning aspiration.  Patient does meet need for empiric antibiotics till the cultures are negative.  I will order vancomycin .  Essential hypertension Restart amlodipine  in the morning based on hypertension/blood pressure trend  Seizure (HCC) This is chronic, continue with Keppra   Type 2 diabetes mellitus (HCC) Continue with 6 units of Lantus at bedtime.  Insulin  sliding scale very sensitive.  Atrial fibrillation (HCC) This is chronically documented in the chart.  Patient is chronically on apixaban  which is being held right now given need for possible knee procedures  Pressure ulcer Patient has soft tissue deep injury of the left lateral ankle/heel.  Patient also apparently has a presacral ulcer reported to me by family.  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  Continue with nectar thick diet, pured, Ensure to be used after thickening.  Hypercalcemia At this time I do not think  patient is acutely symptomatic from this, although patient does have AKI.  Will treat with fluids as ordered including a bolus of LR now.  I will order hypercalcemia workup including ionized calcium  vitamin D  level as well as phosphorus level.  Hold calcitriol  that patient takes at home  Bladder outlet obstruction Chornic. Patent gets cath by family at home. start on foley for now. Continue with tamsulosin  and finasteride   Acute kidney injury superimposed on chronic kidney disease [facility]) Patient has prior renal transplant.  Patient takes cyclosporine  and mycophenolate  for it.  At this time check cyclosporine  level.  Patient has acute kidney injury creatinine up to 2.01 from a baseline of around 1.7.  Will treat with fluids tonight as patient does not appear systemically fluid overloaded.  Likely sepsis related.  However I will consult nephrology Dr. Edson Graces to make sure were not missing anything.  I will order ultrasound of the kidney.  Continue with erythropoietin replacement therapy.  Continue with chronic Lokelma   Liver transplant status (HCC) Continue with mycophenolate , LFTs appear to be at baseline.  Mycophenolate  levels.  Patient had liver and  kidney transplant done at [place]  [facility] Planning:   Code Status: Full Code   Consults: orthopedic engaged. Await call back from renal.  Family Communication: at bedside  Severity of Illness: The appropriate patient status for this patient is OBSERVATION. Observation status is judged to be reasonable and necessary in order to provide the required intensity of service to ensure the patient's safety. The patient's presenting symptoms, physical exam findings, and initial radiographic and laboratory data in the context of their medical condition is felt to place them at decreased risk for further clinical deterioration. Furthermore, it is anticipated that the patient will be medically stable for discharge from the hospital  within 2 midnights of admission.   Author: Bennie Brave, MD 01/04/2024 11:03 PM  For on call review [email or web address].

## 2024-01-04 NOTE — Assessment & Plan Note (Signed)
 Restart amlodipine  in the morning based on hypertension/blood pressure trend

## 2024-01-04 NOTE — Assessment & Plan Note (Signed)
This is chronic, continue with Keppra

## 2024-01-04 NOTE — Assessment & Plan Note (Signed)
 Left knee, acute.  No reported history of trauma and imaging negative for fracture at this time.  Given patient's immunocompromise, high concern for infection.  Blood cultures have been already drawn.  Orthopedics is planning aspiration.  Patient does meet need for empiric antibiotics till the cultures are negative.  I will order vancomycin .

## 2024-01-04 NOTE — Assessment & Plan Note (Signed)
 Patient has soft tissue deep injury of the left lateral ankle/heel.  Patient also apparently has a presacral ulcer reported to me by family.  Turn in position, float heel, wound care eval

## 2024-01-04 NOTE — Assessment & Plan Note (Addendum)
 Chornic. Patent gets cath by family at home. start on foley for now. Continue with tamsulosin  and finasteride 

## 2024-01-04 NOTE — Assessment & Plan Note (Signed)
 At this time I do not think patient is acutely symptomatic from this, although patient does have AKI.  Will treat with fluids as ordered including a bolus of LR now.  I will order hypercalcemia workup including ionized calcium  vitamin D  level as well as phosphorus level.  Hold calcitriol  that patient takes at home

## 2024-01-04 NOTE — ED Provider Notes (Signed)
 [place] EMERGENCY DEPARTMENT AT [facility] Provider Note  History  Chief Complaint:  Weakness and Code Sepsis  The history is provided by the patient and the spouse.     Gilbert Wilson is a 78 y.o. male with a history of CVA, hypertension and residual right upper extremity stricture who presents the emergency department for weakness and concerns over the left lower extremity.  The family provides history who states that the patient has been having left left lower extremity pain since he was discharged in the hospital.  They noted today that his left knee was swollen which prompted them to come to the ER today.  They have not noticed any fever or chills however temp here was 100 axillary.  They state that he gets frequent UTIs.  They have not noticed any other symptoms other than the left lower extremity being more tender as well as having a wound to the left posterior ankle.  They state that patient does not develop sores like this as this is his first 1.  They state he has been compliant with his medications at home.  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   Physical Exam   Vitals:   01/04/24 1636  BP: (!) 143/68  Pulse: 92  Resp: (!) 26  Temp: 100 F (37.8 C)  SpO2: 100%      Physical Exam Vitals and nursing note reviewed.  Constitutional:      General: He is not in acute distress.    Appearance: He is well-developed.  HENT:     Head: Normocephalic and atraumatic.  Eyes:     Conjunctiva/sclera: Conjunctivae normal.  Cardiovascular:     Rate and Rhythm: Normal rate and regular rhythm.     Heart sounds: No murmur heard. Pulmonary:     Effort: Pulmonary effort is normal. No respiratory distress.     Breath sounds: Normal breath sounds.  Abdominal:     Palpations: Abdomen is soft.     Tenderness: There is no abdominal tenderness.  Musculoskeletal:        General: No swelling.     Cervical back: Neck supple.     Comments: Left lower extremity pain generalized, ecchymoses and beginnings of ulcer to the left  posterior ankle, left knee swelling without erythema, does have pain upon extension and flexion of the left knee; RUE stricture  Pulse 2+ is radials bilaterally and DP bilaterally  Skin:    General: Skin is warm and dry.     Capillary Refill: Capillary refill takes less than 2 seconds.  Neurological:     Mental Status: He is alert.  Psychiatric:        Mood and Affect: Mood normal.       Procedures   .Joint Aspiration/Arthrocentesis  Date/Time: 01/04/2024 11:18 PM  Performed by: Arminda Landmark, MD Authorized by: Clay Cummins, MD   Consent:    Consent obtained:  Written   Consent given by:  Patient   Risks, benefits, and alternatives were discussed: yes     Risks discussed:  Bleeding, infection, pain and incomplete drainage Universal protocol:    Immediately prior to procedure, a time out was called: yes     Patient identity confirmed:  Verbally with  patient Location:    Location:  Knee   Knee:  L knee Anesthesia:    Anesthesia method:  Local infiltration Procedure details:    Preparation: Patient was prepped and draped in usual sterile fashion     Needle gauge:  18 G   Ultrasound guidance: no     Approach:  Superior   Aspirate amount:  6   Aspirate characteristics:  Cloudy   Steroid injected: no     Specimen collected: yes   Post-procedure details:    Dressing:  Adhesive bandage   Procedure completion:  Tolerated well, no immediate complications   ED Course - Medical Decision Making  Brief Overview Gilbert Wilson is a 78 y.o. male who presents as per above.  I have reviewed the nursing documentation for past medical history, family history, and social history and agree.  I have reviewed the patient's vital signs. Temp 100.  Initial Differential Diagnoses: I am primarily concerned for sepsis, septic arthritis, AKI, electrolyte abnormalities, fracture, dislocation, DVT.  Therapies: These medications and interventions were provided for the patient while in the ED.  Medications  lidocaine -EPINEPHrine (XYLOCAINE  W/EPI) 2 %-1:200000 (PF) injection 10 mL (has no administration in time range)  albuterol  (PROVENTIL ) (2.5 MG/3ML) 0.083% nebulizer solution 3 mL (has no administration in time range)  cholecalciferol (VITAMIN D3) 25 MCG (1000 UNIT) tablet 2,000 Units (has no administration in time range)  cycloSPORINE  (NEORAL ) 100 MG/ML microemulsion solution 130 mg (has no administration in time range)  Darbepoetin Alfa  (ARANESP ) injection 60 mcg (has no administration in time range)  finasteride  (PROSCAR ) tablet 5 mg (has no administration in time range)  insulin  glargine-yfgn (SEMGLEE) injection 6 Units (has no administration in time range)  levETIRAcetam  (KEPPRA ) tablet 750 mg (has no administration in time range)  mirtazapine  (REMERON ) tablet 15 mg (has no administration in time range)  mycophenolate  (MYFORTIC ) EC tablet  180 mg (has no administration in time range)  feeding supplement (ENSURE ENLIVE / ENSURE PLUS) liquid 237 mL (has no administration in time range)  sodium zirconium cyclosilicate  (LOKELMA ) packet 10 g (has no administration in time range)  tamsulosin  (FLOMAX ) capsule 0.4 mg (has no administration in time range)  traMADol (ULTRAM) tablet 25 mg (has no administration in time range)  pantoprazole (PROTONIX) EC tablet 40 mg (has no administration in time range)  enoxaparin  (LOVENOX ) injection 40 mg (has no administration in time range)  0.9 %  sodium chloride  infusion (has no administration in time range)  acetaminophen  (TYLENOL ) tablet 650 mg (  has no administration in time range)    Or  acetaminophen  (TYLENOL ) suppository 650 mg (has no administration in time range)  polyethylene glycol (MIRALAX  / GLYCOLAX ) packet 17 g (has no administration in time range)  sodium chloride  flush (NS) 0.9 % injection 3 mL (has no administration in time range)  vancomycin  (VANCOCIN ) IVPB 1000 mg/200 mL premix (has no administration in time range)  insulin  aspart (novoLOG ) injection 0-5 Units (has no administration in time range)  insulin  aspart (novoLOG ) injection 0-6 Units (has no administration in time range)  lactated ringers  bolus 1,000 mL (0 mLs Intravenous Stopped 01/04/24 2011)    Testing Results: On my interpretation labs are significant for : Creatinine 2.01 Hgb 8.8  On my interpretation imaging is significant for: XR without fracture or dislocation Large L knee effusion  See the EMR for full details regarding lab and imaging results.   Medical Decision Making 78 year old male who presents to the emergency department for left lower extremity pain.  Patient recently diagnosed with DVT and started on anticoagulation therapy.  They noticed a large left knee swelling today which prompted ER visit.  On exam patient does have a large joint effusion noted.  Some erythema.  Does have pain when attempting to  move R knee. Does have a wound to R heel.  All of your laboratory studies patient has no leukocytosis.  Creatinine stable.  Hemoglobin 8.8.  No significant electrolyte abnormalities.  X-rays were performed which reveals no acute fractures or dislocation.  Does have a large left-sided knee joint effusion.  Currently does not have a source for infection however given that he is immunocompromised we will perform joint aspiration.  I discussed with orthopedics given patient is on anticoagulation.  They state to perform an arthrocentesis and they will see the patient in the morning.  I discussed with the family and they were agreeable with this plan.  Risk and benefits discussed.  Please see procedure note above.  Cloudy fluid was obtained.  I did this with the hospitalist regarding patient.  The patient will be admitted to their service.  Antibiotics were started by them. DVT US  pending.  Amount and/or Complexity of Data Reviewed Labs: ordered. Radiology: ordered.  Risk Decision regarding hospitalization.     ### All radiography studies, electrocardiograms, and laboratory data were personally reviewed by me and incorporated into my medical decision making. Impression   1. Pain and swelling of left knee      Note: Dragon medical dictation software was used in the creation of this note.     Arminda Landmark, MD 01/05/24 Veleta Gerold    Clay Cummins, MD 01/05/24 [phone]

## 2024-01-04 NOTE — Assessment & Plan Note (Addendum)
 Patient has prior renal transplant.  Patient takes cyclosporine  and mycophenolate  for it.  At this time check cyclosporine  level.  Patient has acute kidney injury creatinine up to 2.01 from a baseline of around 1.7.  Will treat with fluids tonight as patient does not appear systemically fluid overloaded.  Likely sepsis related.  However I will consult nephrology Dr. Edson Graces to make sure were not missing anything.  I will order ultrasound of the kidney.  Continue with erythropoietin replacement therapy.  Continue with chronic Lokelma 

## 2024-01-04 NOTE — ED Triage Notes (Signed)
 Pt BIB [facility] from home d/t wife's concerns of his increased weakness/lethargy the past week. Baseline does not speak much with, does have dementia. Wife reports recent Hx of DVT in Lt le & since last night his Lt knee has became very swollen. Also has a wound to the heel of his Lt foot, is reported he has frequent UTI's. 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 140/60, 98 bpm NSR, 25 resp, Co2 23 CBG 225, 20g Lt hand.

## 2024-01-04 NOTE — Assessment & Plan Note (Signed)
 This is a chronic/remote issue.  Continue with nectar thick diet, pured, Ensure to be used after thickening.

## 2024-01-04 NOTE — Sepsis Progress Note (Signed)
 Sepsis protocol monitored by eLink ?

## 2024-01-04 NOTE — Assessment & Plan Note (Signed)
 This is chronically documented in the chart.  Patient is chronically on apixaban  which is being held right now given need for possible knee procedures

## 2024-01-04 NOTE — ED Notes (Signed)
 Patient has a nonblanchable wound on the heel of left foot. Painful to the touch and wound is black in color.

## 2024-01-04 NOTE — Progress Notes (Signed)
 Pharmacy Antibiotic Note  Gilbert Wilson is a 78 y.o. male admitted on 01/04/2024 with  septic arthritis .  Pharmacy has been consulted for vancomycin  dosing. Pt with mild bump in Scr, baseline around 1.7 currently 2.01.   Plan: Start vancomycin  1000mg  q24h for eAUC: 463, Scr: 2.01, Vd: 0.72, and IBW.  Follow culture data for de-escalation.  Monitor renal function for dose adjustments as indicated.   Height: 6\' 1"  (185.4 cm) Weight: 88.9 kg (196 lb) IBW/kg (Calculated) : 79.9  Temp (24hrs), Avg:99.2 F (37.3 C), Min:98.3 F (36.8 C), Max:100 F (37.8 C)  Recent Labs  Lab 01/04/24 1758 01/04/24 1816 01/04/24 2045  WBC 6.0  --   --   CREATININE 2.01*  --   --   LATICACIDVEN  --  1.2 1.1    Estimated Creatinine Clearance: 35.3 mL/min (A) (by C-G formula based on SCr of 2.01 mg/dL (H)).    Allergies  Allergen Reactions   Linezolid Other (See Comments)    Seizures   Levaquin [Levofloxacin] Itching   Lisinopril Cough    Antimicrobials this admission: Vancomycin  5/5 >>   Microbiology results: 5/5 BCx:   Thank you for allowing pharmacy to be a part of this patient's care.  Mamie Searles, PharmD, BCCCP  01/04/2024 10:46 PM

## 2024-01-04 NOTE — ED Notes (Signed)
 Patient transported to X-ray

## 2024-01-04 NOTE — Assessment & Plan Note (Addendum)
 Continue with 6 units of Lantus at bedtime.  Insulin  sliding scale very sensitive.

## 2024-01-04 NOTE — ED Notes (Signed)
 Per resident Arminda Landmark, MD patient was catheterized via in-and-out. of yellow urine was collected.

## 2024-01-05 ENCOUNTER — Observation Stay (HOSPITAL_COMMUNITY)

## 2024-01-05 DIAGNOSIS — N320 Bladder-neck obstruction: Secondary | ICD-10-CM

## 2024-01-05 DIAGNOSIS — I480 Paroxysmal atrial fibrillation: Secondary | ICD-10-CM | POA: Diagnosis not present

## 2024-01-05 DIAGNOSIS — N179 Acute kidney failure, unspecified: Secondary | ICD-10-CM

## 2024-01-05 DIAGNOSIS — L89152 Pressure ulcer of sacral region, stage 2: Secondary | ICD-10-CM

## 2024-01-05 DIAGNOSIS — Z944 Liver transplant status: Secondary | ICD-10-CM

## 2024-01-05 DIAGNOSIS — Z7189 Other specified counseling: Secondary | ICD-10-CM

## 2024-01-05 DIAGNOSIS — N189 Chronic kidney disease, unspecified: Secondary | ICD-10-CM

## 2024-01-05 DIAGNOSIS — M7989 Other specified soft tissue disorders: Secondary | ICD-10-CM | POA: Diagnosis not present

## 2024-01-05 DIAGNOSIS — M1990 Unspecified osteoarthritis, unspecified site: Secondary | ICD-10-CM | POA: Diagnosis not present

## 2024-01-05 DIAGNOSIS — I10 Essential (primary) hypertension: Secondary | ICD-10-CM

## 2024-01-05 DIAGNOSIS — R569 Unspecified convulsions: Secondary | ICD-10-CM

## 2024-01-05 DIAGNOSIS — R1312 Dysphagia, oropharyngeal phase: Secondary | ICD-10-CM

## 2024-01-05 LAB — SYNOVIAL CELL COUNT + DIFF, W/ CRYSTALS
Crystals, Fluid: NONE SEEN
Lymphocytes-Synovial Fld: 10 % (ref 0–20)
Monocyte-Macrophage-Synovial Fluid: 17 % — ABNORMAL LOW (ref 50–90)
Neutrophil, Synovial: 73 % — ABNORMAL HIGH (ref 0–25)
WBC, Synovial: 17820 /mm3 — ABNORMAL HIGH (ref 0–200)

## 2024-01-05 LAB — CBC
HCT: 27.8 % — ABNORMAL LOW (ref 39.0–52.0)
Hemoglobin: 8.4 g/dL — ABNORMAL LOW (ref 13.0–17.0)
MCH: 24.3 pg — ABNORMAL LOW (ref 26.0–34.0)
MCHC: 30.2 g/dL (ref 30.0–36.0)
MCV: 80.6 fL (ref 80.0–100.0)
Platelets: 306 10*3/uL (ref 150–400)
RBC: 3.45 MIL/uL — ABNORMAL LOW (ref 4.22–5.81)
RDW: 17.1 % — ABNORMAL HIGH (ref 11.5–15.5)
WBC: 5.1 10*3/uL (ref 4.0–10.5)
nRBC: 0 % (ref 0.0–0.2)

## 2024-01-05 LAB — BASIC METABOLIC PANEL WITH GFR
Anion gap: 8 (ref 5–15)
BUN: 46 mg/dL — ABNORMAL HIGH (ref 8–23)
CO2: 15 mmol/L — ABNORMAL LOW (ref 22–32)
Calcium: 10.4 mg/dL — ABNORMAL HIGH (ref 8.9–10.3)
Chloride: 122 mmol/L — ABNORMAL HIGH (ref 98–111)
Creatinine, Ser: 1.8 mg/dL — ABNORMAL HIGH (ref 0.61–1.24)
GFR, Estimated: 39 mL/min — ABNORMAL LOW (ref >60)
Glucose, Bld: 131 mg/dL — ABNORMAL HIGH (ref 70–99)
Potassium: 4.1 mmol/L (ref 3.5–5.1)
Sodium: 145 mmol/L (ref 135–145)

## 2024-01-05 LAB — PROTIME-INR
INR: 1.6 — ABNORMAL HIGH (ref 0.8–1.2)
Prothrombin Time: 19.7 s — ABNORMAL HIGH (ref 11.4–15.2)

## 2024-01-05 LAB — GLUCOSE, CAPILLARY
Glucose-Capillary: 113 mg/dL — ABNORMAL HIGH (ref 70–99)
Glucose-Capillary: 106 mg/dL — ABNORMAL HIGH (ref 70–99)
Glucose-Capillary: 102 mg/dL — ABNORMAL HIGH (ref 70–99)
Glucose-Capillary: 151 mg/dL — ABNORMAL HIGH (ref 70–99)
Glucose-Capillary: 180 mg/dL — ABNORMAL HIGH (ref 70–99)

## 2024-01-05 LAB — PHOSPHORUS: Phosphorus: 2.9 mg/dL (ref 2.5–4.6)

## 2024-01-05 LAB — APTT: aPTT: 36 s (ref 24–36)

## 2024-01-05 LAB — CBG MONITORING, ED: Glucose-Capillary: 112 mg/dL — ABNORMAL HIGH (ref 70–99)

## 2024-01-05 LAB — VITAMIN D 25 HYDROXY (VIT D DEFICIENCY, FRACTURES): Vit D, 25-Hydroxy: 60.07 ng/mL (ref 30–100)

## 2024-01-05 MED ORDER — MEDIHONEY WOUND/BURN DRESSING EX PSTE
1.0000 | PASTE | Freq: Every day | CUTANEOUS | Status: DC
  Administered 2024-01-05 – 2024-01-06 (×2): 1 via TOPICAL
  Filled 2024-01-05: qty 44

## 2024-01-05 MED ORDER — ZINC OXIDE 40 % EX OINT
1.0000 | TOPICAL_OINTMENT | Freq: Two times a day (BID) | CUTANEOUS | Status: DC
  Administered 2024-01-05 (×2): 1 via TOPICAL
  Filled 2024-01-05: qty 57

## 2024-01-05 MED ORDER — SODIUM CHLORIDE 0.45 % IV SOLN
INTRAVENOUS | Status: DC
  Filled 2024-01-05: qty 75

## 2024-01-05 MED ORDER — STERILE WATER FOR INJECTION IV SOLN
INTRAVENOUS | Status: AC
  Filled 2024-01-05: qty 1000
  Filled 2024-01-05: qty 150

## 2024-01-05 MED ORDER — DICLOFENAC SODIUM 1 % EX GEL
4.0000 g | Freq: Four times a day (QID) | CUTANEOUS | Status: DC
  Administered 2024-01-05 – 2024-01-06 (×4): 4 g via TOPICAL
  Filled 2024-01-05: qty 100

## 2024-01-05 MED ORDER — APIXABAN 5 MG PO TABS
5.0000 mg | ORAL_TABLET | Freq: Two times a day (BID) | ORAL | Status: DC
  Administered 2024-01-05 – 2024-01-06 (×2): 5 mg via ORAL
  Filled 2024-01-05 (×2): qty 1

## 2024-01-05 MED ORDER — AMLODIPINE BESYLATE 5 MG PO TABS
5.0000 mg | ORAL_TABLET | Freq: Every day | ORAL | Status: DC
  Administered 2024-01-05 – 2024-01-06 (×2): 5 mg via ORAL
  Filled 2024-01-05 (×2): qty 1

## 2024-01-05 NOTE — Progress Notes (Signed)
 LLE venous duplex has been completed.    Results can be found under chart review under CV PROC. 01/05/2024 1:36 PM Mathews Stuhr RVT, RDMS

## 2024-01-05 NOTE — Progress Notes (Addendum)
 PROGRESS NOTE  Gilbert Wilson EGB:[id number] DOB: March 26, 1946   PCP: [facility]  Patient is from: Home.  Lives with family.  Bedbound.  DOA: 01/04/2024 LOS: 0  Chief complaints Chief Complaint  Patient presents with   Weakness   Code Sepsis     Brief Narrative / Interim history: 78 year old M with PMH of severe dementia (bedbound, total care and basically nonverbal), renal and liver transplant, CVA with RUE weakness, seizure disorder, bladder outlet obstruction with iron, frequent UTI, DVT and A-fib no longer on AC, osteoarthritis, sacral decubitus and left ankle pressure skin injury brought to ED due to left knee swelling and pain.  Family noticed left knee pain and swelling the day of presentation.  No report of trauma or fall.  In ED, stable vitals. Cr 2.0 (b/l 1.7).  BUN 49.  Bicarb 18.  AG 8.  Lactic acid 1.2 and 1.1..  WBC 6.0.  Hgb 8.8 (b/l 7-8).  UA negative.  Left knee x-ray showed large joint effusion.  CXR showed cardiomegaly.  Hip x-ray concerning for stool impaction.  Renal US  without acute finding.  Blood cultures obtained.  Had arthrocentesis in ED.  Orthopedic surgery consulted as well.  Admission requested for left knee effusion.  Started on ertapenem and vancomycin .  Synovial fluid with 17,820 WBC but no crystals.  Fluid culture pending.  Blood cultures NGTD.  Antibiotics de-escalated to ceftriaxone  and vancomycin   Subjective: Seen and examined earlier this morning.  No major events overnight of this morning.  Patient is awake but does not talk at baseline.  Does not appear to be in distress.  Patient's wife at bedside.  Extensive discussion about goal of care in light of patient's advanced comorbidity.  We have discussed pros and cons of CPR and intubation.  I frankly recommended DNR and DNI.  She appreciated the information but "I am not ready".  I offered palliative care consult but she declined.  Objective: Vitals:   01/05/24 0106 01/05/24 0200 01/05/24  0600 01/05/24 1247  BP: (!) 142/65 (!) 147/63 (!) 142/70 (!) 143/63  Pulse: 82 86 80 78  Resp: (!) 22  18 17   Temp:   98.4 F (36.9 C) 97.6 F (36.4 C)  TempSrc:   Oral Oral  SpO2: 99%  98% 100%  Weight:      Height:        Examination:  GENERAL: No apparent distress.  Very frail. HEENT: MMM.  Vision and hearing grossly intact.  NECK: Supple.  No apparent JVD.  RESP:  No IWOB.  Fair aeration bilaterally. CVS:  RRR. Heart sounds normal.  ABD/GI/GU: BS+. Abd soft, NTND.  MSK/EXT:  Moves extremities.  Significant muscle mass and subcu fat loss.  Significant left knee effusion SKIN: Pressure skin injury in left heel.  Stage II sacral decubitus.  See pictures in the media NEURO: Awake.  Does not talk or follow commands.  No apparent focal neuro deficit. PSYCH: Calm. Normal affect.   Consultants:  Orthopedic surgery  Procedures: 5/5-arthrocentesis by ED PA  Microbiology summarized: 5/5-COVID-19, influenza and RSV PCR nonreactive 5/5-blood cultures NGTD 5/5- left synovial fluid culture pending  Assessment and plan: Left knee effusion: Due to arthritis?  No fever, leukocytosis or increased warmth to touch. S/p arthrocentesis in ED.  Fluid analysis negative for crystals but with 18,000 WBCs.  Blood cultures NGTD -Continue vancomycin .  Change ertapenem to ceftriaxone .  Patient is immunocompromised. 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  Renal ultrasound of transplanted kidney without acute finding -Continue holding diuretics -Continue monitoring -Continue home Myfortic   Essential hypertension: BP within  acceptable range -Continue home amlodipine    History of seizure -Continue Keppra  -Discontinue tramadol   History of atrial fibrillation: Rate controlled History of DVT -Resume home Eliquis   Oropharyngeal dysphagia: Discharged on dysphagia 2 diet last hospitalization. -Dysphagia 2 diet     Hypercalcemia: Mild.  Likely due to dehydration and renal failure.  Vitamin D  level 60. -Hold Calcitrol and vitamin D  -IV fluid-with sodium bicarbonate  -Follow-up PTH, and ionized calcium  -Recheck in the morning   Bladder outlet obstruction/BPH: Patent gets cath by family at home.  - Foley catheter while in the hospital -Continue with tamsulosin  and finasteride    Liver transplant status (HCC) -Continue with mycophenolate  -Mycophenolate  levels.   Severe dementia without behavioral disturbance:  - Delirium precaution  Advance care planning: Extensive discussion with patient's wife at bedside.  Remains full code with full scope of care.  Offered palliative consult but patient's wife declined respectively.  Body mass index is 25.86 kg/m.        Stage II sacral decubitus and unstageable left heel pressure skin injury: Present on arrival.  No signs of infection.  See pictures in the media. -Appreciate help by WOCN  DVT prophylaxis:  enoxaparin  (LOVENOX ) injection 40 mg Start: 01/05/24 1000 SCDs Start: 01/04/24 2233  Code Status: Full code-code on phone with patient's wife Family Communication: Updated patient's wife at bedside Level of care: Telemetry Medical Status is: Observation The patient will require care spanning > 2 midnights and should be moved to inpatient because: Left knee effusion, AKI, hypercalcemia   Final disposition: Likely home   55 minutes with more than 50% spent in reviewing records, counseling patient/family and coordinating care.   Sch Meds:  Scheduled Meds:  amLODipine   5 mg Oral Daily   cycloSPORINE   130 mg Oral BID   [START ON 01/09/2024] Darbepoetin  Alfa  60 mcg Subcutaneous Q Sat-1800   enoxaparin  (LOVENOX ) injection  40 mg Subcutaneous Q24H   feeding supplement  237 mL Oral BID BM   finasteride   5 mg Oral QHS   insulin  aspart  0-5 Units Subcutaneous QHS   insulin  aspart  0-6 Units Subcutaneous TID WC   insulin  glargine-yfgn  6 Units Subcutaneous QHS   leptospermum manuka honey  1 Application Topical Daily   levETIRAcetam   750 mg Oral BID   lidocaine -EPINEPHrine  10 mL Infiltration Once   liver oil-zinc oxide  1 Application Topical BID   mirtazapine   15 mg Oral QHS   mycophenolate   180 mg Oral BID   pantoprazole  40 mg Oral Daily   sodium chloride  flush  3 mL Intravenous Q12H   sodium zirconium cyclosilicate   10 g Oral Daily   tamsulosin   0.4 mg Oral Daily   Continuous Infusions:  sodium bicarbonate  150 mEq in sterile water  1,150 mL infusion     vancomycin  1,000 mg (01/05/24 0202)   PRN Meds:.acetaminophen  **OR** acetaminophen , albuterol , polyethylene glycol  Antimicrobials: Anti-infectives (From admission, onward)    Start     Dose/Rate Route Frequency Ordered Stop   01/04/24 2300  vancomycin  (VANCOCIN ) IVPB 1000 mg/200 mL premix  1,000 mg 200 mL/hr over 60 Minutes Intravenous Every 24 hours 01/04/24 2250          I have personally reviewed the following labs and images: CBC: Recent Labs  Lab 01/04/24 1758 01/05/24 0557  WBC 6.0 5.1  NEUTROABS 4.2  --   HGB 8.8* 8.4*  HCT 29.8* 27.8*  MCV 81.9 80.6  PLT 369 306   BMP &GFR Recent Labs  Lab 01/04/24 1758 01/05/24 0557  NA 145 145  K 4.4 4.1  CL 119* 122*  CO2 18* 15*  GLUCOSE 159* 131*  BUN 49* 46*  CREATININE 2.01* 1.80*  CALCIUM  10.5* 10.4*  PHOS  --  2.9   Estimated Creatinine Clearance: 39.5 mL/min (A) (by C-G formula based on SCr of 1.8 mg/dL (H)). Liver & Pancreas: Recent Labs  Lab 01/04/24 1758  AST 13*  ALT 13  ALKPHOS 92  BILITOT 0.7  PROT 6.8  ALBUMIN 2.5*   No results for input(s): "LIPASE", "AMYLASE" in the last 168  hours. No results for input(s): "AMMONIA" in the last 168 hours. Diabetic: No results for input(s): "HGBA1C" in the last 72 hours. Recent Labs  Lab 01/05/24 0040 01/05/24 0134 01/05/24 0833 01/05/24 1314  GLUCAP 112* 113* 106* 102*   Cardiac Enzymes: No results for input(s): "CKTOTAL", "CKMB", "CKMBINDEX", "TROPONINI" in the last 168 hours. No results for input(s): "PROBNP" in the last 8760 hours. Coagulation Profile: Recent Labs  Lab 01/04/24 1758 01/05/24 0557  INR 1.7* 1.6*   Thyroid Function Tests: No results for input(s): "TSH", "T4TOTAL", "FREET4", "T3FREE", "THYROIDAB" in the last 72 hours. Lipid Profile: No results for input(s): "CHOL", "HDL", "LDLCALC", "TRIG", "CHOLHDL", "LDLDIRECT" in the last 72 hours. Anemia Panel: No results for input(s): "VITAMINB12", "FOLATE", "FERRITIN", "TIBC", "IRON", "RETICCTPCT" in the last 72 hours. Urine analysis:    Component Value Date/Time   COLORURINE YELLOW 01/04/2024 2153   APPEARANCEUR CLEAR 01/04/2024 2153   LABSPEC 1.010 01/04/2024 2153   PHURINE 6.0 01/04/2024 2153   GLUCOSEU NEGATIVE 01/04/2024 2153   HGBUR NEGATIVE 01/04/2024 2153   BILIRUBINUR NEGATIVE 01/04/2024 2153   KETONESUR NEGATIVE 01/04/2024 2153   PROTEINUR NEGATIVE 01/04/2024 2153   NITRITE NEGATIVE 01/04/2024 2153   LEUKOCYTESUR NEGATIVE 01/04/2024 2153   Sepsis Labs: Invalid input(s): "PROCALCITONIN", "LACTICIDVEN"  Microbiology: Recent Results (from the past 240 hours)  Blood Culture (routine x 2)     Status: None (Preliminary result)   Collection Time: 01/04/24  5:58 PM   Specimen: BLOOD  Result Value Ref Range Status   Specimen Description BLOOD LEFT ANTECUBITAL  Final   Special Requests   Final    BOTTLES DRAWN AEROBIC AND ANAEROBIC Blood Culture results may not be optimal due to an inadequate volume of blood received in culture bottles   Culture   Final    NO GROWTH < 24 HOURS Performed at [facility], [address],  [place], [place] [address]    Report Status PENDING  Incomplete  Resp panel by RT-PCR (RSV, Flu A&B, Covid) Anterior Nasal Swab     Status: None   Collection Time: 01/04/24  6:58 PM   Specimen: Anterior Nasal Swab  Result Value Ref Range Status   SARS Coronavirus 2 by RT PCR NEGATIVE NEGATIVE Final   Influenza A by PCR NEGATIVE NEGATIVE Final   Influenza B by PCR NEGATIVE NEGATIVE Final    Comment: (NOTE) The Xpert Xpress SARS-CoV-2/FLU/RSV plus assay is intended as an aid in the diagnosis of influenza from Nasopharyngeal swab  specimens and should not be used as a sole basis for treatment. Nasal washings and aspirates are unacceptable for Xpert Xpress SARS-CoV-2/FLU/RSV testing.  Fact Sheet for Patients: BloggerCourse.com  Fact Sheet for Healthcare Providers: SeriousBroker.it  This test is not yet approved or cleared by the [place]  FDA and has been authorized for detection and/or diagnosis of SARS-CoV-2 by FDA under an Emergency Use Authorization (EUA). This EUA will remain in effect (meaning this test can be used) for the duration of the COVID-19 declaration under Section 564(b)(1) of the Act, 21 U.S.C. section 360bbb-3(b)(1), unless the authorization is terminated or revoked.     Resp Syncytial Virus by PCR NEGATIVE NEGATIVE Final    Comment: (NOTE) Fact Sheet for Patients: BloggerCourse.com  Fact Sheet for Healthcare Providers: SeriousBroker.it  This test is not yet approved or cleared by the [place]  FDA and has been authorized for detection and/or diagnosis of SARS-CoV-2 by FDA under an Emergency Use Authorization (EUA). This EUA will remain in effect (meaning this test can be used) for the duration of the COVID-19 declaration under Section 564(b)(1) of the Act, 21 U.S.C. section 360bbb-3(b)(1), unless the authorization is terminated or revoked.  Performed at  [facility], [address] [place], [place] [address]   Blood Culture (routine x 2)     Status: None (Preliminary result)   Collection Time: 01/04/24  7:11 PM   Specimen: BLOOD LEFT FOREARM  Result Value Ref Range Status   Specimen Description BLOOD LEFT FOREARM  Final   Special Requests   Final    BOTTLES DRAWN AEROBIC AND ANAEROBIC Blood Culture adequate volume   Culture   Final    NO GROWTH < 12 HOURS Performed at [facility], [address], [place], [place] [address]    Report Status PENDING  Incomplete  Body fluid culture w Gram Stain     Status: None (Preliminary result)   Collection Time: 01/05/24  7:54 AM   Specimen: Path fluid; Body Fluid  Result Value Ref Range Status   Specimen Description FLUID  Final   Special Requests Immunocompromised  Final   Gram Stain   Final    MODERATE WBC PRESENT, PREDOMINANTLY PMN NO ORGANISMS SEEN Performed at [facility], [address], [place], [place] [address]    Culture PENDING  Incomplete   Report Status PENDING  Incomplete    Radiology Studies: VAS US  LOWER EXTREMITY VENOUS (DVT) (7a-7p) Result Date: 01/05/2024  Lower Venous DVT Study Patient Name:  OTNIEL PHENG  Date of Exam:   01/05/2024 Medical Rec #: [id number]          Accession #:    [id number] Date of Birth: August 07, 1946          Patient Gender: M Patient Age:   15 years Exam Location:  [facility] Procedure:      VAS US  LOWER EXTREMITY VENOUS (DVT) Referring Phys: [facility] GOEL --------------------------------------------------------------------------------  Indications: Pain.  Limitations: Poor ultrasound/tissue interface. Comparison Study: Previous exam on 11/28/2023 was positive for DVT in LLE CFV, FV                   (prox), and PFV Performing Technologist: Jody Hill RVT, RDMS  Examination Guidelines: A complete evaluation includes B-mode imaging, spectral Doppler, color Doppler, and power Doppler as needed of all accessible portions of each vessel.  Bilateral testing is considered an integral part of a complete examination. Limited examinations for reoccurring indications may be performed  as noted. The reflux portion of the exam is performed with the patient in reverse Trendelenburg.  +-----+---------------+---------+-----------+----------+--------------+ RIGHTCompressibilityPhasicitySpontaneityPropertiesThrombus Aging +-----+---------------+---------+-----------+----------+--------------+ CFV  Full           Yes      Yes                                 +-----+---------------+---------+-----------+----------+--------------+   +---------+---------------+---------+-----------+----------+--------------+ LEFT     CompressibilityPhasicitySpontaneityPropertiesThrombus Aging +---------+---------------+---------+-----------+----------+--------------+ CFV      Full           Yes      Yes                                 +---------+---------------+---------+-----------+----------+--------------+ SFJ      Full                                                        +---------+---------------+---------+-----------+----------+--------------+ FV Prox  Full           Yes                                          +---------+---------------+---------+-----------+----------+--------------+ FV Mid   Full           Yes      Yes                                 +---------+---------------+---------+-----------+----------+--------------+ FV DistalFull           Yes      Yes                                 +---------+---------------+---------+-----------+----------+--------------+ PFV      Full           Yes      Yes                                 +---------+---------------+---------+-----------+----------+--------------+ POP      Full           Yes      Yes                                 +---------+---------------+---------+-----------+----------+--------------+ Incidental finding of SFA occlusion (mid) reconsitution  of flow through collateral in SFA (distal).    Summary: RIGHT: - No evidence of common femoral vein obstruction.   LEFT: - Findings suggest resolution of previously noted thrombus. - There is no evidence of deep vein thrombosis in the lower extremity.  - Incidental finding of short segment SFA occlusion. Flow is reconstituted through collateral in distal SFA.  *See table(s) above for measurements and observations.  Suggest Peripheral Vascular Consult.    Preliminary    US  Renal Transplant w/Doppler Result Date: 01/04/2024 CLINICAL DATA:  Acute kidney injury EXAM: ULTRASOUND OF RENAL TRANSPLANT WITH RENAL DOPPLER ULTRASOUND TECHNIQUE: Ultrasound examination of the renal  transplant was performed with gray-scale, color and duplex doppler evaluation. COMPARISON:  CT chest abdomen and pelvis 11/20/2023 FINDINGS: Transplant kidney location: RLQ Transplant Kidney: Renal measurements: 12.3 x 7.0 x 6.7 cm = volume: . Normal in size and parenchymal echogenicity. No hydronephrosis. There is in anatomic cyst thin septation measuring 2.0 x 1.8 x 2.2 cm. No peri-transplant fluid collection seen. Color flow in the main renal artery:  Yes Color flow in the main renal vein:  Yes Duplex Doppler Evaluation: Main Renal Artery Velocity: 151.2 cm/sec Main Renal Artery Resistive Index: 0.89 Venous waveform in main renal vein:  Present Intrarenal resistive index in upper pole:  0.74 (normal 0.6-0.8; equivocal 0.8-0.9; abnormal >= 0.9) Intrarenal resistive index in lower pole: 0.73 (normal 0.6-0.8; equivocal 0.8-0.9; abnormal >= 0.9) Bladder: Not visualized. Other findings:  None. IMPRESSION: 1. No hydronephrosis. 2. Mildly elevated resistive index in the main renal artery. 3. 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 Electronically Signed   By: Tyron Gallon M.D.   On: 01/04/2024 23:43   DG Ankle 2 Views Left Result Date: 01/04/2024 CLINICAL DATA:  Questionable sepsis EXAM: LEFT ANKLE - 2 VIEW COMPARISON:  None Available.  FINDINGS: Overlying soft tissue artifact is present. The bones are diffusely osteopenic. There is no acute fracture or dislocation. There are well corticated densities adjacent to the tip of the medial malleolus likely related to old injury erosions are identified. No cortical erosions are identified. Peripheral vascular calcifications are present. IMPRESSION: 1. No acute fracture or dislocation. 2. Diffuse osteopenia. 3. Peripheral vascular disease. Electronically Signed   By: Tyron Gallon M.D.   On: 01/04/2024 18:11   DG Knee Complete 4 Views Left Result Date: 01/04/2024 CLINICAL DATA:  Question sepsis.  Left knee swelling. EXAM: LEFT KNEE - COMPLETE 4+ VIEW COMPARISON:  None Available. FINDINGS: Large joint effusion within the left knee. No acute bony abnormality. Specifically, no fracture, subluxation, or dislocation. Early degenerative changes in the left knee. Diffuse vascular calcifications. IMPRESSION: Early osteoarthritis. Large joint effusion. No acute bony abnormality. Electronically Signed   By: Janeece Mechanic M.D.   On: 01/04/2024 18:09   DG Chest 1 View Result Date: 01/04/2024 CLINICAL DATA:  Sepsis EXAM: CHEST  1 VIEW COMPARISON:  Chest x-ray 11/30/2023 FINDINGS: The heart is enlarged. Lung volumes are low. The lungs are clear. There is no pleural effusion or pneumothorax. No acute fractures are seen. There are surgical clips in the upper abdomen. IMPRESSION: Cardiomegaly. No acute cardiopulmonary process. Electronically Signed   By: Tyron Gallon M.D.   On: 01/04/2024 18:04   DG Hip Unilat W or Wo Pelvis 2-3 Views Left Result Date: 01/04/2024 CLINICAL DATA:  Questionable sepsis EXAM: DG HIP (WITH OR WITHOUT PELVIS) 2-3V LEFT COMPARISON:  None Available. FINDINGS: The bones are diffusely osteopenic. There is no evidence of hip fracture or dislocation. Joint spaces are maintained. There is a large stool ball within the rectum. Peripheral vascular calcifications are present. IMPRESSION: 1. No acute  fracture or dislocation. 2. Large stool ball within the rectum worrisome for impaction. Electronically Signed   By: Tyron Gallon M.D.   On: 01/04/2024 18:03      Lastacia Solum T. Chesnee Floren [facility]  If 7PM-7AM, please contact night-coverage [email or web address] 01/05/2024, 2:24 PM

## 2024-01-05 NOTE — Consult Note (Signed)
 Reason for Consult:Left knee pain Referring Physician: Taye Gonfa Time called: 1440 Time at bedside: 1522   Gilbert Wilson is an 78 y.o. male.  HPI: Gilbert Wilson was admitted with a left knee effusion and pain. He's been c/o left leg pain for months but he knee swelling came about just in last few days. He was tapped in the ED but surprisingly only got 6ml. Analysis is unconcerning at this point. Daughter notes he seems to c/o more left hip pain during turns than knee. He is nonverbal and cannot contribute to history.  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 His smoking use included cigarettes. He has never used smokeless tobacco. He reports that he does not drink alcohol and does not use drugs.  Allergies:  Allergies  Allergen Reactions   Linezolid Other (See Comments)    Seizures   Levaquin [Levofloxacin] Itching   Lisinopril Cough    Medications: I have reviewed the patient's current medications.  Results for orders placed or performed during the hospital encounter of 01/04/24 (from the past 48 hours)  Comprehensive metabolic panel     Status: Abnormal   Collection Time: 01/04/24  5:58 PM  Result Value Ref Range   Sodium 145 135 - 145 mmol/L   Potassium 4.4 3.5 - 5.1 mmol/L   Chloride 119 (H) 98 - 111 mmol/L   CO2 18 (L) 22 - 32  mmol/L   Glucose, Bld 159 (H) 70 - 99 mg/dL    Comment: Glucose reference range applies only to samples taken after fasting for at least 8 hours.   BUN 49 (H) 8 - 23 mg/dL   Creatinine, Ser 1.61 (H) 0.61 - 1.24 mg/dL   Calcium  10.5 (H) 8.9 - 10.3 mg/dL   Total Protein 6.8 6.5 - 8.1 g/dL   Albumin 2.5 (L) 3.5 - 5.0 g/dL   AST 13 (L) 15 - 41 U/L   ALT 13 0 - 44 U/L   Alkaline Phosphatase 92 38 - 126 U/L   Total Bilirubin 0.7 0.0 - 1.2 mg/dL   GFR, Estimated 34 (L) >60 mL/min    Comment: (NOTE) Calculated using the CKD-EPI Creatinine Equation (2021)    Anion gap 8 5 - 15    Comment: Performed at [facility], [address], [place], [place] [address]  CBC with Differential     Status: Abnormal   Collection Time: 01/04/24  5:58 PM  Result Value Ref Range   WBC 6.0 4.0 - 10.5 K/uL   RBC 3.64 (L) 4.22 - 5.81 MIL/uL   Hemoglobin 8.8 (L) 13.0 - 17.0 g/dL   HCT 54.0 (L) 98.1 - 19.1 %   MCV  81.9 80.0 - 100.0 fL   MCH 24.2 (L) 26.0 - 34.0 pg   MCHC 29.5 (L) 30.0 - 36.0 g/dL   RDW 98.1 (H) 19.1 - 47.8 %   Platelets 369 150 - 400 K/uL   nRBC 0.0 0.0 - 0.2 %   Neutrophils Relative % 68 %   Neutro Abs 4.2 1.7 - 7.7 K/uL   Lymphocytes Relative 16 %   Lymphs Abs 0.9 0.7 - 4.0 K/uL   Monocytes Relative 11 %   Monocytes Absolute 0.6 0.1 - 1.0 K/uL   Eosinophils Relative 3 %   Eosinophils Absolute 0.2 0.0 - 0.5 K/uL   Basophils Relative 1 %   Basophils Absolute 0.1 0.0 - 0.1 K/uL   Immature Granulocytes 1 %   Abs Immature Granulocytes 0.03 0.00 - 0.07 K/uL    Comment: Performed at [facility], [address], [place], [place] [address]  Protime-INR     Status: Abnormal   Collection Time: 01/04/24  5:58 PM  Result Value Ref Range   Prothrombin Time 20.1 (H) 11.4 - 15.2 seconds   INR 1.7 (H) 0.8 - 1.2    Comment: (NOTE) INR goal varies based on device and disease states. Performed at [facility], [address], [place], [place] [address]   Blood Culture (routine  x 2)     Status: None (Preliminary result)   Collection Time: 01/04/24  5:58 PM   Specimen: BLOOD  Result Value Ref Range   Specimen Description BLOOD LEFT ANTECUBITAL    Special Requests      BOTTLES DRAWN AEROBIC AND ANAEROBIC Blood Culture results may not be optimal due to an inadequate volume of blood received in culture bottles   Culture      NO GROWTH < 24 HOURS Performed at [facility], [address] [place], [place] [address]    Report Status PENDING   I-Stat Lactic Acid, ED     Status: None   Collection Time: 01/04/24  6:16 PM  Result Value Ref Range   Lactic Acid, Venous 1.2 0.5 - 1.9 mmol/L  Resp panel by RT-PCR (RSV, Flu A&B, Covid) Anterior Nasal Swab     Status: None   Collection Time: 01/04/24  6:58 PM   Specimen: Anterior Nasal Swab  Result Value Ref Range   SARS Coronavirus 2 by RT PCR NEGATIVE NEGATIVE   Influenza A by PCR NEGATIVE NEGATIVE   Influenza B by PCR NEGATIVE NEGATIVE    Comment: (NOTE) The Xpert Xpress SARS-CoV-2/FLU/RSV plus assay is intended as an aid in the diagnosis of influenza from Nasopharyngeal swab specimens and should not be used as a sole basis for treatment. Nasal washings and aspirates are unacceptable for Xpert Xpress SARS-CoV-2/FLU/RSV testing.  Fact Sheet for Patients: BloggerCourse.com  Fact Sheet for Healthcare Providers: SeriousBroker.it  This test is not yet approved or cleared by the [place]  FDA and has been authorized for detection and/or diagnosis of SARS-CoV-2 by FDA under an Emergency Use Authorization (EUA). This EUA will remain in effect (meaning this test can be used) for the duration of the COVID-19 declaration under Section 564(b)(1) of the Act, 21 U.S.C. section 360bbb-3(b)(1), unless the authorization is terminated or revoked.     Resp Syncytial Virus by PCR NEGATIVE NEGATIVE    Comment: (NOTE) Fact Sheet for  Patients: BloggerCourse.com  Fact Sheet for Healthcare Providers: SeriousBroker.it  This test is not yet approved or cleared by the [place]  FDA and has been  authorized for detection and/or diagnosis of SARS-CoV-2 by FDA under an Emergency Use Authorization (EUA). This EUA will remain in effect (meaning this test can be used) for the duration of the COVID-19 declaration under Section 564(b)(1) of the Act, 21 U.S.C. section 360bbb-3(b)(1), unless the authorization is terminated or revoked.  Performed at [facility], [address] [place], [place] [address]   Blood Culture (routine x 2)     Status: None (Preliminary result)   Collection Time: 01/04/24  7:11 PM   Specimen: BLOOD LEFT FOREARM  Result Value Ref Range   Specimen Description BLOOD LEFT FOREARM    Special Requests      BOTTLES DRAWN AEROBIC AND ANAEROBIC Blood Culture adequate volume   Culture      NO GROWTH < 12 HOURS Performed at [facility], [address], [place], [place] [address]    Report Status PENDING   I-Stat CG4 Lactic Acid, ED     Status: Abnormal   Collection Time: 01/04/24  7:30 PM  Result Value Ref Range   Lactic Acid, Venous >15.0 (HH) 0.5 - 1.9 mmol/L  I-Stat Lactic Acid, ED     Status: None   Collection Time: 01/04/24  8:45 PM  Result Value Ref Range   Lactic Acid, Venous 1.1 0.5 - 1.9 mmol/L  Urinalysis, w/ Reflex to Culture (Infection Suspected) -Urine, Catheterized     Status: None   Collection Time: 01/04/24  9:53 PM  Result Value Ref Range   Specimen Source URINE, CATHETERIZED    Color, Urine YELLOW YELLOW   APPearance CLEAR CLEAR   Specific Gravity, Urine 1.010 1.005 - 1.030   pH 6.0 5.0 - 8.0   Glucose, UA NEGATIVE NEGATIVE mg/dL   Hgb urine dipstick NEGATIVE NEGATIVE   Bilirubin Urine NEGATIVE NEGATIVE   Ketones, ur NEGATIVE NEGATIVE mg/dL   Protein, ur NEGATIVE NEGATIVE mg/dL   Nitrite NEGATIVE NEGATIVE    Leukocytes,Ua NEGATIVE NEGATIVE    Comment: Performed at [facility], [address], [place], [place] [address]  Synovial cell count + diff, w/ crystals     Status: Abnormal   Collection Time: 01/04/24 11:14 PM  Result Value Ref Range   Color, Synovial YELLOW (A) YELLOW   Appearance-Synovial TURBID (A) CLEAR   Crystals, Fluid NO CRYSTALS SEEN    WBC, Synovial 17,820 (H) 0 - 200 /cu mm   Neutrophil, Synovial 73 (H) 0 - 25 %   Lymphocytes-Synovial Fld 10 0 - 20 %   Monocyte-Macrophage-Synovial Fluid 17 (L) 50 - 90 %    Comment: Performed at [facility], [address] [place], [place] [address]  CBG monitoring, ED     Status: Abnormal   Collection Time: 01/05/24 12:40 AM  Result Value Ref Range   Glucose-Capillary 112 (H) 70 - 99 mg/dL    Comment: Glucose reference range applies only to samples taken after fasting for at least 8 hours.  Glucose, capillary     Status: Abnormal   Collection Time: 01/05/24  1:34 AM  Result Value Ref Range   Glucose-Capillary 113 (H) 70 - 99 mg/dL    Comment: Glucose reference range applies only to samples taken after fasting for at least 8 hours.  APTT     Status: None   Collection Time: 01/05/24  5:57 AM  Result Value Ref Range   aPTT 36 24 - 36 seconds    Comment: Performed at [facility], [address]  [place], [place] [address]  Protime-INR     Status: Abnormal   Collection Time: 01/05/24  5:57 AM  Result Value Ref Range   Prothrombin Time 19.7 (H) 11.4 - 15.2 seconds   INR 1.6 (H) 0.8 - 1.2    Comment: (NOTE) INR goal varies based on device and disease states. Performed at [facility], [address] [place], [place] [address]   Basic metabolic panel     Status: Abnormal   Collection Time: 01/05/24  5:57 AM  Result Value Ref Range   Sodium 145 135 - 145 mmol/L   Potassium 4.1 3.5 - 5.1 mmol/L   Chloride 122 (H) 98 - 111 mmol/L   CO2 15 (L) 22 - 32 mmol/L   Glucose, Bld 131 (H) 70 - 99 mg/dL    Comment:  Glucose reference range applies only to samples taken after fasting for at least 8 hours.   BUN 46 (H) 8 - 23 mg/dL   Creatinine, Ser 2.95 (H) 0.61 - 1.24 mg/dL   Calcium  10.4 (H) 8.9 - 10.3 mg/dL   GFR, Estimated 39 (L) >60 mL/min    Comment: (NOTE) Calculated using the CKD-EPI Creatinine Equation (2021)    Anion gap 8 5 - 15    Comment: Performed at [facility], [address] [place], [place] [address]  CBC     Status: Abnormal   Collection Time: 01/05/24  5:57 AM  Result Value Ref Range   WBC 5.1 4.0 - 10.5 K/uL   RBC 3.45 (L) 4.22 - 5.81 MIL/uL   Hemoglobin 8.4 (L) 13.0 - 17.0 g/dL   HCT 86.5 (L) 78.4 - 69.6 %   MCV 80.6 80.0 - 100.0 fL   MCH 24.3 (L) 26.0 - 34.0 pg   MCHC 30.2 30.0 - 36.0 g/dL   RDW 29.5 (H) 28.4 - 13.2 %   Platelets 306 150 - 400 K/uL   nRBC 0.0 0.0 - 0.2 %    Comment: Performed at [facility], [address] [place], [place] [address]  VITAMIN D  25 Hydroxy (Vit-D Deficiency, Fractures)     Status: None   Collection Time: 01/05/24  5:57 AM  Result Value Ref Range   Vit D, 25-Hydroxy 60.07 30 - 100 ng/mL    Comment: (NOTE) Vitamin D  deficiency has been defined by the Institute of Medicine  and an Endocrine Society practice guideline as a level of serum 25-OH  vitamin D  less than 20 ng/mL (1,2). The Endocrine Society went on to  further define vitamin D  insufficiency as a level between 21 and 29  ng/mL (2).  1. IOM (Institute of Medicine). 2010. Dietary reference intakes for  calcium  and D. [place]: [employer]. 2. Holick MF, Binkley [place], Bischoff-Ferrari HA, et al. Evaluation,  treatment, and prevention of vitamin D  deficiency: an Endocrine  Society clinical practice guideline, JCEM. 2011 Jul; 96(7): 1911-30.  Performed at [facility], [address], [place], [place] [address]   Phosphorus     Status: None   Collection Time: 01/05/24  5:57 AM  Result Value Ref Range   Phosphorus 2.9 2.5 - 4.6 mg/dL     Comment: Performed at [facility], [address] [place], [place] [address]  Body fluid culture w Gram Stain     Status: None (Preliminary result)   Collection Time: 01/05/24  7:54 AM   Specimen: Path fluid; Body Fluid  Result Value Ref Range  Specimen Description FLUID    Special Requests Immunocompromised    Gram Stain      MODERATE WBC PRESENT, PREDOMINANTLY PMN NO ORGANISMS SEEN Performed at [facility], [address] [place], [place] [address]    Culture PENDING    Report Status PENDING   Glucose, capillary     Status: Abnormal   Collection Time: 01/05/24  8:33 AM  Result Value Ref Range   Glucose-Capillary 106 (H) 70 - 99 mg/dL    Comment: Glucose reference range applies only to samples taken after fasting for at least 8 hours.  Glucose, capillary     Status: Abnormal   Collection Time: 01/05/24  1:14 PM  Result Value Ref Range   Glucose-Capillary 102 (H) 70 - 99 mg/dL    Comment: Glucose reference range applies only to samples taken after fasting for at least 8 hours.    VAS US  LOWER EXTREMITY VENOUS (DVT) (7a-7p) Result Date: 01/05/2024  Lower Venous DVT Study Patient Name:  Gilbert Wilson  Date of Exam:   01/05/2024 Medical Rec #: [id number]          Accession #:    [id number] Date of Birth: [birth year]          Patient Gender: M Patient Age:   30 years Exam Location:  [facility] Procedure:      VAS US  LOWER EXTREMITY VENOUS (DVT) Referring Phys: [facility] GOEL --------------------------------------------------------------------------------  Indications: Pain.  Limitations: Poor ultrasound/tissue interface. Comparison Study: Previous exam on 11/28/2023 was positive for DVT in LLE CFV, FV                   (prox), and PFV Performing Technologist: Jody Hill RVT, RDMS  Examination Guidelines: A complete evaluation includes B-mode imaging, spectral Doppler, color Doppler, and power Doppler as needed of all accessible portions of each vessel. Bilateral testing is  considered an integral part of a complete examination. Limited examinations for reoccurring indications may be performed as noted. The reflux portion of the exam is performed with the patient in reverse Trendelenburg.  +-----+---------------+---------+-----------+----------+--------------+ RIGHTCompressibilityPhasicitySpontaneityPropertiesThrombus Aging +-----+---------------+---------+-----------+----------+--------------+ CFV  Full           Yes      Yes                                 +-----+---------------+---------+-----------+----------+--------------+   +---------+---------------+---------+-----------+----------+--------------+ LEFT     CompressibilityPhasicitySpontaneityPropertiesThrombus Aging +---------+---------------+---------+-----------+----------+--------------+ CFV      Full           Yes      Yes                                 +---------+---------------+---------+-----------+----------+--------------+ SFJ      Full                                                        +---------+---------------+---------+-----------+----------+--------------+ FV Prox  Full           Yes                                          +---------+---------------+---------+-----------+----------+--------------+  FV Mid   Full           Yes      Yes                                 +---------+---------------+---------+-----------+----------+--------------+ FV DistalFull           Yes      Yes                                 +---------+---------------+---------+-----------+----------+--------------+ PFV      Full           Yes      Yes                                 +---------+---------------+---------+-----------+----------+--------------+ POP      Full           Yes      Yes                                 +---------+---------------+---------+-----------+----------+--------------+ Incidental finding of SFA occlusion (mid) reconsitution of flow through  collateral in SFA (distal).    Summary: RIGHT: - No evidence of common femoral vein obstruction.   LEFT: - Findings suggest resolution of previously noted thrombus. - There is no evidence of deep vein thrombosis in the lower extremity.  - Incidental finding of short segment SFA occlusion. Flow is reconstituted through collateral in distal SFA.  *See table(s) above for measurements and observations.  Suggest Peripheral Vascular Consult. Electronically signed by Delaney Fearing on 01/05/2024 at 2:53:41 PM.    Final    US  Renal Transplant w/Doppler Result Date: 01/04/2024 CLINICAL DATA:  Acute kidney injury EXAM: ULTRASOUND OF RENAL TRANSPLANT WITH RENAL DOPPLER ULTRASOUND TECHNIQUE: Ultrasound examination of the renal transplant was performed with gray-scale, color and duplex doppler evaluation. COMPARISON:  CT chest abdomen and pelvis 11/20/2023 FINDINGS: Transplant kidney location: RLQ Transplant Kidney: Renal measurements: 12.3 x 7.0 x 6.7 cm = volume: . Normal in size and parenchymal echogenicity. No hydronephrosis. There is in anatomic cyst thin septation measuring 2.0 x 1.8 x 2.2 cm. No peri-transplant fluid collection seen. Color flow in the main renal artery:  Yes Color flow in the main renal vein:  Yes Duplex Doppler Evaluation: Main Renal Artery Velocity: 151.2 cm/sec Main Renal Artery Resistive Index: 0.89 Venous waveform in main renal vein:  Present Intrarenal resistive index in upper pole:  0.74 (normal 0.6-0.8; equivocal 0.8-0.9; abnormal >= 0.9) Intrarenal resistive index in lower pole: 0.73 (normal 0.6-0.8; equivocal 0.8-0.9; abnormal >= 0.9) Bladder: Not visualized. Other findings:  None. IMPRESSION: 1. No hydronephrosis. 2. Mildly elevated resistive index in the main renal artery. 3. 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 Electronically Signed   By: Tyron Gallon M.D.   On: 01/04/2024 23:43   DG Ankle 2 Views Left Result Date: 01/04/2024 CLINICAL DATA:  Questionable sepsis EXAM: LEFT  ANKLE - 2 VIEW COMPARISON:  None Available. FINDINGS: Overlying soft tissue artifact is present. The bones are diffusely osteopenic. There is no acute fracture or dislocation. There are well corticated densities adjacent to the tip of the medial malleolus likely related to old injury erosions are identified. No cortical erosions are identified. Peripheral vascular calcifications are present.  IMPRESSION: 1. No acute fracture or dislocation. 2. Diffuse osteopenia. 3. Peripheral vascular disease. Electronically Signed   By: Tyron Gallon M.D.   On: 01/04/2024 18:11   DG Knee Complete 4 Views Left Result Date: 01/04/2024 CLINICAL DATA:  Question sepsis.  Left knee swelling. EXAM: LEFT KNEE - COMPLETE 4+ VIEW COMPARISON:  None Available. FINDINGS: Large joint effusion within the left knee. No acute bony abnormality. Specifically, no fracture, subluxation, or dislocation. Early degenerative changes in the left knee. Diffuse vascular calcifications. IMPRESSION: Early osteoarthritis. Large joint effusion. No acute bony abnormality. Electronically Signed   By: Janeece Mechanic M.D.   On: 01/04/2024 18:09   DG Chest 1 View Result Date: 01/04/2024 CLINICAL DATA:  Sepsis EXAM: CHEST  1 VIEW COMPARISON:  Chest x-ray 11/30/2023 FINDINGS: The heart is enlarged. Lung volumes are low. The lungs are clear. There is no pleural effusion or pneumothorax. No acute fractures are seen. There are surgical clips in the upper abdomen. IMPRESSION: Cardiomegaly. No acute cardiopulmonary process. Electronically Signed   By: Tyron Gallon M.D.   On: 01/04/2024 18:04   DG Hip Unilat W or Wo Pelvis 2-3 Views Left Result Date: 01/04/2024 CLINICAL DATA:  Questionable sepsis EXAM: DG HIP (WITH OR WITHOUT PELVIS) 2-3V LEFT COMPARISON:  None Available. FINDINGS: The bones are diffusely osteopenic. There is no evidence of hip fracture or dislocation. Joint spaces are maintained. There is a large stool ball within the rectum. Peripheral vascular  calcifications are present. IMPRESSION: 1. No acute fracture or dislocation. 2. Large stool ball within the rectum worrisome for impaction. Electronically Signed   By: Tyron Gallon M.D.   On: 01/04/2024 18:03    Review of Systems  Unable to perform ROS: Patient nonverbal   Blood pressure (!) 143/63, pulse 78, temperature 97.6 F (36.4 C), temperature source Oral, resp. rate 17, height 6\' 1"  (1.854 m), weight 88.9 kg, SpO2 100%. Physical Exam Constitutional:      General: He is not in acute distress.    Appearance: He is well-developed. He is not diaphoretic.  HENT:     Head: Normocephalic and atraumatic.  Eyes:     General: No scleral icterus.       Right eye: No discharge.        Left eye: No discharge.     Conjunctiva/sclera: Conjunctivae normal.  Cardiovascular:     Rate and Rhythm: Normal rate and regular rhythm.  Pulmonary:     Effort: Pulmonary effort is normal. No respiratory distress.  Musculoskeletal:     Cervical back: Normal range of motion.     Comments: LLE No traumatic wounds, ecchymosis, or rash  Nontender as near as I can tell, some pain with PROM knee but able to range ~45 degrees  Mod knee effusion  Knee stable to varus/ valgus and anterior/posterior stress  Sens DPN, SPN, TN could not assess  Motor EHL, ext, flex, evers could not assess  No significant edema  Skin:    General: Skin is warm and dry.  Neurological:     Mental Status: He is alert.  Psychiatric:        Mood and Affect: Mood normal.        Behavior: Behavior normal.     Assessment/Plan: Left knee effusion -- Does seem like he has a residual effusion but I've been fooled before; could be extraarticular swelling. Conservative treatment at this point would be ACE compression and topical NSAID which I'll order. Could consider IR tap of knee in  case of technical deficiency during first tap or, more like, loculations or extraarticular fluid. I think this may be therapeutic but doubt will result in  any further diagnostic help. Family will consider that and let team know.    Georganna Kin, PA-C Orthopedic Surgery [phone] 01/05/2024, 3:36 PM

## 2024-01-05 NOTE — Consult Note (Addendum)
 WOC Nurse Consult Note: Reason for Consult: sacral and L heel wound  Wound type: 1  Stage 3 Pressure Injury Sacrum  2.      L heel  Deep Tissue Pressure Injury that is evolving to unstageable  Pressure Injury POA: Yes Measurement: see nursing flowsheet  Wound bed: 1.  Sacrum 50% red 50% yellow/tan necrotic  Drainage (amount, consistency, odor) see nursing flowsheet  Periwound: 1. Edges of sacral wound appear purple maroon (likely started as a deep tissue pressure injury)  2.  Purple maroon discoloration to L heel and ankle with developing eschar to L heel  Dressing procedure/placement/frequency: 1.  Cleanse sacrum/buttocks with Vashe wound cleanser Timm Foot [phone]) do not rinse and allow to air dry. Apply Medihoney to wound bed daily, cover with dry gauze.  Apply a thin layer of Desitin to surrounding intact skin.  Cover with silicone foam or ABD pad whichever is preferred.  2.  Cleanse L heel with Vashe, apply Xeroform gauze (Lawson [phone]) to wound bed daily, secure with dry gauze and Kerlix roll gauze or silicone foam whichever is preferred.  Place L foot in Prevalon boot to offload pressure Timm Foot [phone])   POC discussed with bedside nurse. Patient would benefit from ongoing management of L heel with podiatry/ortho as there is high risk for developing worsening wound.  WOC team will not follow. Re-consult if further needs arise.   Thank you,    Ronni Colace MSN, RN-BC, [employer] [phone]

## 2024-01-05 NOTE — Progress Notes (Signed)
 Sacrum and left heel cleansed and dressed today per wound care orders.

## 2024-01-05 NOTE — TOC Initial Note (Addendum)
 Transition of Care [facility]) - Initial/Assessment Note    Patient Details  Name: Gilbert Wilson MRN: [id number] Date of Birth: 1946-02-04  Transition of [facility]) CM/SW Contact:    Alisa App, RN Phone Number: 01/05/2024, 11:38 AM  Clinical Narrative:    Presents with c/o L knee pain and swelling.  NCM spoke with pt's daughter Craige Dixon @ bedside regarding d/c planning. Pt from home with family ( wife , daughter, grandchildren). Supportive family. Pt with hx of dementia, stroke with right upper extremity plegia ,bed bound. Patient does not talk at baseline. Pt active with [facility] ( RN) and receives 20 hrs /week PCS from the [place]. PCP: Climmie Damme Day / In home primary care.  DME: [facility] B[facility] , [facility], W/C.  TOC team following and will assist with needs...  Expected Discharge Plan: Home w Home Health Services Barriers to Discharge: Continued Medical Work up   Patient Goals and CMS Choice            Expected Discharge Plan and Services   Discharge Planning Services: CM Consult   Living arrangements for the past 2 months: Single Family Home     Prior Living Arrangements/Services Living arrangements for the past 2 months: Single Family Home Lives with:: Spouse, Adult Children Patient language and need for interpreter reviewed:: No Do you feel safe going back to the place where you live?: Yes      Need for Family Participation in Patient Care: Yes (Comment) Care giver support system in place?: Yes (comment) Current home services: DME, Homehealth aide Criminal Activity/Legal Involvement Pertinent to Current Situation/Hospitalization: No - Comment as needed  Activities of Daily Living   ADL Screening (condition at time of admission) Independently performs ADLs?: No Does the patient have a NEW difficulty with bathing/dressing/toileting/self-feeding that is expected to last >3 days?: No Does the patient have a NEW difficulty with getting in/out of bed, walking,  or climbing stairs that is expected to last >3 days?: No Does the patient have a NEW difficulty with communication that is expected to last >3 days?: No Is the patient deaf or have difficulty hearing?: No Does the patient have difficulty seeing, even when wearing glasses/contacts?: No Does the patient have difficulty concentrating, remembering, or making decisions?: Yes  Permission Sought/Granted                  Emotional Assessment         Alcohol / Substance Use: Not Applicable Psych Involvement: No (comment)  Admission diagnosis:  Arthritis [M19.90] Pain and swelling of left knee [W09.811, M25.462] Patient Active Problem List   Diagnosis Date Noted   Arthritis 01/04/2024   Bladder outlet obstruction 01/04/2024   Hypercalcemia 01/04/2024   Aspiration into airway 01/04/2024   Pressure ulcer 01/04/2024   Protein-calorie malnutrition, severe 11/30/2023   CAP (community acquired pneumonia) 11/21/2023   Acute on chronic diastolic CHF (congestive heart failure) (HCC) 11/21/2023   Thrombocytopenia (HCC) 10/04/2023   Rhabdomyolysis 10/04/2023   Community acquired pneumonia 10/04/2023   Influenza A with pneumonia 09/28/2023   Acute encephalopathy 01/01/2022   Complicated UTI (urinary tract infection) 01/01/2022   Acute kidney injury superimposed on chronic kidney disease (HCC) 01/01/2022   History of CVA with residual deficit 01/01/2022   Dementia (HCC) 11/25/2021   Pyuria 11/25/2021   Type 2 diabetes mellitus (HCC) 11/24/2021   Stroke (HCC) 11/24/2021   Vitamin D  deficiency 11/24/2021   Immunodeficiency, unspecified (HCC) 11/24/2021   Seizure (HCC) 11/24/2021   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   Posttraumatic stress disorder 11/24/2021   Obstructive sleep apnea of adult 11/24/2021   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/24/2021   History of renal transplant 11/24/2021   Liver transplant status  (HCC) 11/24/2021   Hemiplegia, unspecified affecting unspecified side (HCC) 11/24/2021   Essential hypertension 11/24/2021   Dysphagia, unspecified 11/24/2021   Chronic hepatitis C (HCC) 11/24/2021   Cirrhosis of liver (HCC) 11/24/2021   Cerebellar stroke syndrome 11/24/2021   Cardiomegaly 11/24/2021   Beta thalassemia trait 11/24/2021   Benign prostatic hyperplasia with lower urinary tract symptoms 11/24/2021   Atrial fibrillation (HCC) 11/24/2021   Liver transplant recipient [facility]) 11/24/2021   Acute CVA (cerebrovascular accident) (HCC) 11/24/2021   Bradycardia 07/04/2021   Anemia 05/21/2009   PCP:  [facility]:   [facility] [phone] - [address] [place] - [address] [place] [address] Phone: [phone] Fax: [phone]  [facility] - [place], [place] - [address] [place] [address] Phone: [phone] Fax: [phone]  [facility] [address] [place] [address] Phone: [phone] Fax: [phone]     Social Drivers of Health (SDOH) Social History: SDOH Screenings   Food Insecurity: No Food Insecurity (01/05/2024)  Housing: Low Risk  (01/05/2024)  Transportation Needs: No Transportation Needs (01/05/2024)  Utilities: Not At Risk (01/05/2024)  Social Connections: Moderately Integrated (01/05/2024)  Recent Concern: Social Connections - Socially Isolated (11/21/2023)  Tobacco Use: Medium Risk (01/04/2024)   SDOH Interventions:     Readmission Risk Interventions    11/27/2021   10:20 AM  Readmission Risk Prevention Plan  Transportation Screening Complete  PCP or Specialist Appt within 3-5 Days Complete  HRI or Home Care Consult Complete  Social Work Consult for Recovery Care Planning/Counseling Complete  Palliative Care Screening Not Applicable  Medication Review Oceanographer) Complete

## 2024-01-05 NOTE — ED Notes (Signed)
 Wife at bedside confirmed that pt can only take PO meds if he is given them in applesauce. This RN called pharmacy to confirm that pt PO meds can be given in applesauce, Pharmacist confirmed yes. Also reported to this RN that the meds that come from main pharmacy are being sent to the floor when pt goes upstairs instead of to the ED.

## 2024-01-06 ENCOUNTER — Encounter (HOSPITAL_COMMUNITY): Payer: Self-pay | Admitting: Internal Medicine

## 2024-01-06 ENCOUNTER — Observation Stay (HOSPITAL_COMMUNITY)

## 2024-01-06 DIAGNOSIS — Z944 Liver transplant status: Secondary | ICD-10-CM | POA: Diagnosis not present

## 2024-01-06 DIAGNOSIS — L89620 Pressure ulcer of left heel, unstageable: Secondary | ICD-10-CM | POA: Insufficient documentation

## 2024-01-06 DIAGNOSIS — L89152 Pressure ulcer of sacral region, stage 2: Secondary | ICD-10-CM | POA: Insufficient documentation

## 2024-01-06 DIAGNOSIS — M1990 Unspecified osteoarthritis, unspecified site: Secondary | ICD-10-CM | POA: Diagnosis not present

## 2024-01-06 DIAGNOSIS — I480 Paroxysmal atrial fibrillation: Secondary | ICD-10-CM | POA: Diagnosis not present

## 2024-01-06 LAB — GLUCOSE, CAPILLARY
Glucose-Capillary: 94 mg/dL (ref 70–99)
Glucose-Capillary: 141 mg/dL — ABNORMAL HIGH (ref 70–99)
Glucose-Capillary: 134 mg/dL — ABNORMAL HIGH (ref 70–99)

## 2024-01-06 LAB — PARATHYROID HORMONE, INTACT (NO CA): PTH: 23 pg/mL (ref 15–65)

## 2024-01-06 LAB — CBC
HCT: 26.6 % — ABNORMAL LOW (ref 39.0–52.0)
Hemoglobin: 8.1 g/dL — ABNORMAL LOW (ref 13.0–17.0)
MCH: 24.1 pg — ABNORMAL LOW (ref 26.0–34.0)
MCHC: 30.5 g/dL (ref 30.0–36.0)
MCV: 79.2 fL — ABNORMAL LOW (ref 80.0–100.0)
Platelets: 355 10*3/uL (ref 150–400)
RBC: 3.36 MIL/uL — ABNORMAL LOW (ref 4.22–5.81)
RDW: 16.7 % — ABNORMAL HIGH (ref 11.5–15.5)
WBC: 5 10*3/uL (ref 4.0–10.5)
nRBC: 0 % (ref 0.0–0.2)

## 2024-01-06 LAB — RENAL FUNCTION PANEL
Albumin: 2.1 g/dL — ABNORMAL LOW (ref 3.5–5.0)
Anion gap: 9 (ref 5–15)
BUN: 42 mg/dL — ABNORMAL HIGH (ref 8–23)
CO2: 18 mmol/L — ABNORMAL LOW (ref 22–32)
Calcium: 10.3 mg/dL (ref 8.9–10.3)
Chloride: 117 mmol/L — ABNORMAL HIGH (ref 98–111)
Creatinine, Ser: 1.7 mg/dL — ABNORMAL HIGH (ref 0.61–1.24)
GFR, Estimated: 41 mL/min — ABNORMAL LOW (ref >60)
Glucose, Bld: 144 mg/dL — ABNORMAL HIGH (ref 70–99)
Phosphorus: 2.6 mg/dL (ref 2.5–4.6)
Potassium: 4.2 mmol/L (ref 3.5–5.1)
Sodium: 144 mmol/L (ref 135–145)

## 2024-01-06 LAB — MAGNESIUM: Magnesium: 1.8 mg/dL (ref 1.7–2.4)

## 2024-01-06 LAB — CALCIUM, IONIZED: Calcium, Ionized, Serum: 6 mg/dL — ABNORMAL HIGH (ref 4.5–5.6)

## 2024-01-06 MED ORDER — ACETAMINOPHEN 325 MG PO TABS: 650.0000 mg | ORAL_TABLET | Freq: Three times a day (TID) | ORAL | Status: DC

## 2024-01-06 MED ORDER — VANCOMYCIN HCL 1250 MG/250ML IV SOLN
1250.0000 mg | INTRAVENOUS | Status: DC
  Filled 2024-01-06: qty 250

## 2024-01-06 MED ORDER — ACETAMINOPHEN 500 MG PO TABS
1000.0000 mg | ORAL_TABLET | Freq: Four times a day (QID) | ORAL | Status: DC
  Administered 2024-01-06 (×2): 1000 mg via ORAL
  Filled 2024-01-06 (×2): qty 2

## 2024-01-06 MED ORDER — LIDOCAINE HCL 1 % IJ SOLN
20.0000 mL | Freq: Once | INTRAMUSCULAR | Status: DC
  Filled 2024-01-06: qty 20

## 2024-01-06 MED ORDER — VANCOMYCIN HCL 1.25 G IV SOLR
1250.0000 mg | INTRAVENOUS | Status: DC
  Filled 2024-01-06: qty 25

## 2024-01-06 MED ORDER — SENNOSIDES-DOCUSATE SODIUM 8.6-50 MG PO TABS: 1.0000 | ORAL_TABLET | Freq: Two times a day (BID) | ORAL | Status: DC | PRN

## 2024-01-06 MED ORDER — APIXABAN 5 MG PO TABS: 5.0000 mg | ORAL_TABLET | Freq: Two times a day (BID) | ORAL | 2 refills | Status: DC

## 2024-01-06 MED ORDER — LIDOCAINE HCL 1 % IJ SOLN
INTRAMUSCULAR | Status: AC
  Filled 2024-01-06: qty 20

## 2024-01-06 NOTE — Care Management Obs Status (Signed)
 MEDICARE OBSERVATION STATUS NOTIFICATION   Patient Details  Name: Gilbert Wilson MRN: [id number] Date of Birth: 1946/08/31   Medicare Observation Status Notification Given:  Yes    Felix Host 01/06/2024, 9:45 AM

## 2024-01-06 NOTE — Procedures (Signed)
 Interventional Radiology Procedure Note  Procedure:   US  guided attempt at aspiration of left knee effusion.   Findings:  Small volume, with significant complexity.  Unable to aspirate any fluid by 18g needle  Complications: None  Recommendations:  - routine wound/dressing care    Signed,  Marciano Settles. Mabel Savage, DO

## 2024-01-06 NOTE — TOC Transition Note (Signed)
 Transition of Care [facility]) - Discharge Note   Patient Details  Name: Gilbert Wilson MRN: [id number] Date of Birth: [birth year]  Transition of Care [facility]) CM/SW Contact:  Omie Bickers, RN Phone Number: 01/06/2024, 2:30 PM   Clinical Narrative:     Confirmed DC plan with patient's wife Adel Holt They have all DME needed, and want to continue with [facility]. I have placed resumption of services order for [facility] and notified agency.  Patient will need PTAR for transport home, I have confirmed address with wife, and sent PTAR forms to unit. I have notified the nurse that Lyna Sandhoff has been called.   Final next level of care: Home w Home Health Services Barriers to Discharge: No Barriers Identified   Patient Goals and CMS Choice Patient states their goals for this hospitalization and ongoing recovery are:: to go home CMS Medicare.gov Compare Post Acute Care list provided to:: Other (Comment Required) Choice offered to / list presented to : Spouse      Discharge Placement                       Discharge Plan and Services Additional resources added to the After Visit Summary for     Discharge Planning Services: CM Consult            DME Arranged: N/A         HH Arranged: PT, OT, RN HH Agency: [facility] Date [facility] Agency Contacted: 01/06/24 Time HH Agency Contacted: 1430 Representative spoke with at [facility] Agency: Bartholomew Light  Social Drivers of Health (SDOH) Interventions SDOH Screenings   Food Insecurity: No Food Insecurity (01/05/2024)  Housing: Low Risk  (01/05/2024)  Transportation Needs: No Transportation Needs (01/05/2024)  Utilities: Not At Risk (01/05/2024)  Social Connections: Moderately Integrated (01/05/2024)  Recent Concern: Social Connections - Socially Isolated (11/21/2023)  Tobacco Use: Medium Risk (01/04/2024)     Readmission Risk Interventions    11/27/2021   10:20 AM  Readmission Risk Prevention Plan  Transportation Screening Complete  PCP or Specialist Appt  within 3-5 Days Complete  HRI or Home Care Consult Complete  Social Work Consult for Recovery Care Planning/Counseling Complete  Palliative Care Screening Not Applicable  Medication Review Oceanographer) Complete

## 2024-01-06 NOTE — Discharge Summary (Signed)
 Physician Discharge Summary  Gilbert Wilson:[id number] DOB: July 04, 1946 DOA: 01/04/2024  PCP: [facility]  Admit date: 01/04/2024 Discharge date: 01/06/24  Admitted From: Home Disposition: Home Recommendations for Outpatient Follow-up:  Follow up with PCP and nephrology in 1 to 2 weeks Reassess left knee, pressure skin injury (sacral and left heel) Recommend ongoing goal of care discussion.  Please follow up on the following pending results: CellCept  and cyclosporine  level  Home Health: No need identified.  Patient is bed-bound.  Has caregiver Equipment/Devices: Patient has appropriate DME's  Discharge Condition: Stable but poor long-term prognosis CODE STATUS: Full code  Follow-up Information     [facility].   Contact information: [address] [facility] [address] [place] [address] [phone]                 Hospital course 78 year old M with PMH of severe dementia (bedbound, total care and basically nonverbal), renal and liver transplant, CVA with RUE weakness, seizure disorder, bladder outlet obstruction with iron, frequent UTI, DVT and A-fib no longer on AC, osteoarthritis, sacral decubitus and left ankle pressure skin injury brought to ED due to left knee swelling and pain.  Family noticed left knee pain and swelling the day of presentation.  No report of trauma or fall.   In ED, stable vitals. Cr 2.0 (b/l 1.7).  BUN 49.  Bicarb 18.  AG 8.  Lactic acid 1.2 and 1.1..  WBC 6.0.  Hgb 8.8 (b/l 7-8).  UA negative.  Left knee x-ray showed large joint effusion.  CXR showed cardiomegaly.  Hip x-ray concerning for stool impaction.  Renal US  without acute finding.  Blood cultures obtained.  Had arthrocentesis in ED.  Orthopedic surgery consulted as well.  Admission requested for left knee effusion.  Started on ertapenem and vancomycin .   Synovial fluid with 17,820 WBC but no crystals.  Blood cultures NGTD.  Antibiotics de-escalated  to ceftriaxone  and vancomycin .  Evaluated by orthopedic surgery, and they recommended supportive care with Ace wrap, topical NSAIDs and IR consult.    On the day of discharge, knee swelling improved.  US  guided suprapatellar arthrocentesis by IR did not yield any fluid due to significant complexity and small volume.  Synovial culture from initial arthrocentesis NGTD.  Antibiotics discontinued.  Discharged to follow-up with PCP  Recommend ongoing goal of care discussion outpatient.  See individual problem list below for more.   Problems addressed during this hospitalization Left knee effusion: Due to arthritis?  No fever, leukocytosis or increased warmth to touch. S/p arthrocentesis in ED.  Fluid analysis negative for crystals but with 18,000 WBCs.  Blood cultures NGTD.  Fluid cultures NGTD.  Antibiotics discontinued.  Repeat arthrocentesis attempted but did not yield fluid. 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  AKI resolved.  Renal US  of transplanted kidney without acute finding. History of renal transplant Recent Labs    11/27/23 0221 11/28/23 0225 11/29/23 0213 11/30/23 1108 12/01/23 0231 12/02/23 0234 12/03/23 0241 01/04/24 1758 01/05/24 0557 01/06/24 0500  BUN 52* 44* 45* 46* 47* 52* 58* 49* 46* 42*  CREATININE 2.21* 1.91* 1.90* 1.73* 1.70* 1.69* 1.72* 2.01* 1.80* 1.70*  - Outpatient follow-up - Continue home cyclosporine  and CellCept   - Follow cyclosporine  and CellCept  level - Recheck in 1 week  Essential hypertension: BP within acceptable range -Continue home amlodipine    History of seizure -Continue home Keppra  -Discussed  the risk of seizure with tramadol with patient's wife.  Encouraged to discuss with prescriber   History of atrial fibrillation: Rate controlled History of DVT - Continue home Eliquis    Oropharyngeal dysphagia: Discharged on dysphagia 2 diet last hospitalization. -  Continue dysphagia 2 diet   Hypercalcemia: Mild.  Likely due to dehydration and renal failure.  Vitamin D  level 60.  PTH normal. - Discontinued Calcitrol and vitamin D  -IV fluid-with sodium bicarbonate  -Follow-up PTH, and ionized calcium  -Recheck in the morning   Bladder outlet obstruction/BPH: Patent gets cath by family at home.  -Continue with tamsulosin  and finasteride    Liver transplant status (HCC) -Continue with mycophenolate  and cyclosporine  - Continue CellCept  and cyclosporine  levels   Severe dementia without behavioral disturbance: Awake but basically nonverbal.  Total dependence for ADL's. - Goal of care discussion and palliative referral   Advance care planning: Extensive discussion with patient's wife at bedside.  Remains full code with full scope of care.  Offered palliative consult but patient's wife declined respectively.    Stage II sacral decubitus and unstageable left heel pressure skin injury: Present on arrival.  No signs of infection.  See pictures in the media. -Appreciate help by [facility] details below    Time spent 35 minutes  Vital signs Vitals:   01/05/24 1949 01/06/24 0416 01/06/24 0800 01/06/24 0810  BP: (!) 140/60 (!) 145/61 130/61 130/61  Pulse: 76 75 74 75  Temp: 98.3 F (36.8 C) 97.8 F (36.6 C) 98.6 F (37 C) 98.6 F (37 C)  Resp: 18 18 18    Height:      Weight:      SpO2: 100% 100% 100% 100%  TempSrc: Oral Oral Oral Oral  BMI (Calculated):         Discharge exam  GENERAL: No apparent distress.  Very frail. HEENT: MMM.  Vision and hearing grossly intact.  NECK: Supple.  No apparent JVD.  RESP:  No IWOB.  Fair aeration bilaterally. CVS:  RRR. Heart sounds normal.  ABD/GI/GU: BS+. Abd soft, NTND.  MSK/EXT:  Moves extremities.  Significant muscle mass and subcu fat loss.  Suprapatella left knee effusion.  No erythema, increased warmth to touch or tenderness. SKIN: Pressure skin injury in left heel.  Stage II sacral decubitus.  See  pictures below. NEURO: Awake.  Does not talk or follow commands.  No apparent focal neuro deficit. PSYCH: Calm. Normal affect.          Discharge Instructions Discharge Instructions     Diet general   Complete by: As directed    Dysphagia 2 diet   Discharge instructions   Complete by: As directed    It has been a pleasure taking care of you!  You were hospitalized due to left knee swelling likely due to osteoarthritis.  The fluid drained from your left knee did not show gout or infection.  Continue Ace wrap for left knee swelling.  Note that we have stopped your vitamin D  and calcitriol  due to slightly elevated calcium  level. Please review your new medication list and the directions on your medications before you take them. Follow-up with your primary care doctor and nephrologist as previously planned. Avoid any over-the-counter pain medication other than plain Tylenol . Note that tramadol can increase your risk of seizure.  Please discuss this with your prescriber.   Take care,   Discharge wound care:   Complete by: As directed    Wound care  Daily      1.  Cleanse sacrum/buttocks  with Vashe wound cleanser (Lawson [phone]) do not rinse and allow to air dry. Apply Medihoney to wound bed daily, cover with dry gauze.  Apply a thin layer of Desitin to surrounding intact skin.  Cover with silicone foam or ABD pad whichever is preferred.   2.  Cleanse L heel with Vashe, apply Xeroform gauze (Lawson [phone]) to wound bed daily, secure with dry gauze and Kerlix roll gauze or silicone foam whichever is preferred.  Place L foot in Prevalon boot to offload pressure   Increase activity slowly   Complete by: As directed       Allergies as of 01/06/2024       Reactions   Linezolid Other (See Comments)   Seizures   Levaquin [levofloxacin] Itching   Lisinopril Cough        Medication List     STOP taking these medications    calcitRIOL  0.25 MCG capsule Commonly known as:  ROCALTROL    Cholecalciferol 50 MCG (2000 UT) Caps       TAKE these medications    acetaminophen  325 MG tablet Commonly known as: TYLENOL  Take 2 tablets (650 mg total) by mouth every 8 (eight) hours. What changed:  medication strength how much to take when to take this reasons to take this   albuterol  108 (90 Base) MCG/ACT inhaler Commonly known as: VENTOLIN  HFA Inhale 2 puffs into the lungs every 6 (six) hours as needed for wheezing or shortness of breath.   amLODipine  5 MG tablet Commonly known as: NORVASC  Take 1 tablet (5 mg total) by mouth daily.   apixaban  5 MG Tabs tablet Commonly known as: ELIQUIS  Take 1 tablet (5 mg total) by mouth 2 (two) times daily. What changed:  how much to take how to take this when to take this additional instructions   ascorbic acid 250 MG tablet Commonly known as: VITAMIN C Take 250 mg by mouth at bedtime. Take 1 tablet by mouth with the 325mg  iron tablet.   cycloSPORINE  100 MG/ML microemulsion solution Commonly known as: NEORAL  Take 130 mg by mouth 2 (two) times daily. 1.56ml   Ensure High Protein Liqd Take 237 mLs by mouth 2 (two) times daily.   epoetin alfa 10000 UNIT/ML injection Commonly known as: EPOGEN Inject 10,000 Units into the skin once. Give every 10 days   ferrous sulfate  325 (65 FE) MG EC tablet Take 325 mg by mouth at bedtime. Take 1 tablet by mouth with the 250mg  vitamin c tablet.   finasteride  5 MG tablet Commonly known as: PROSCAR  Take 5 mg by mouth at bedtime.   furosemide  20 MG tablet Commonly known as: LASIX  Take 1 tablet (20 mg total) by mouth daily as needed for edema.   Lantus SoloStar 100 UNIT/ML Solostar Pen Generic drug: insulin  glargine Inject 0-6 Units into the skin at bedtime. If BS>130-Take 6 units, Increase 1 unit for every 10 mg/dl above 409.   levETIRAcetam  750 MG tablet Commonly known as: KEPPRA  Take 1 tablet (750 mg total) by mouth 2 (two) times daily.   Lokelma  10 g Pack  packet Generic drug: sodium zirconium cyclosilicate  Take 10 g by mouth daily.   mirtazapine  15 MG tablet Commonly known as: REMERON  Take 0.5 tablets (7.5 mg total) by mouth at bedtime. What changed: how much to take   mycophenolate  180 MG EC tablet Commonly known as: MYFORTIC  Take 180 mg by mouth 2 (two) times daily.   omeprazole 20 MG capsule Commonly known as: PRILOSEC Take 20 mg by mouth  at bedtime.   senna-docusate 8.6-50 MG tablet Commonly known as: Senokot-S Take 1-2 tablets by mouth 2 (two) times daily between meals as needed for mild constipation or moderate constipation.   tamsulosin  0.4 MG Caps capsule Commonly known as: FLOMAX  Take 0.4 mg by mouth daily. Take 1 tablet by mouth with Vit D3 2000iu   traMADol 50 MG tablet Commonly known as: ULTRAM Take 25-50 mg by mouth at bedtime as needed for moderate pain (pain score 4-6).               Discharge Care Instructions  (From admission, onward)           Start     Ordered   01/06/24 0000  Discharge wound care:       Comments: Wound care  Daily      1.  Cleanse sacrum/buttocks with Vashe wound cleanser Timm Foot [phone]) do not rinse and allow to air dry. Apply Medihoney to wound bed daily, cover with dry gauze.  Apply a thin layer of Desitin to surrounding intact skin.  Cover with silicone foam or ABD pad whichever is preferred.   2.  Cleanse L heel with Vashe, apply Xeroform gauze (Lawson [phone]) to wound bed daily, secure with dry gauze and Kerlix roll gauze or silicone foam whichever is preferred.  Place L foot in Prevalon boot to offload pressure   01/06/24 1334            Consultations: Orthopedic surgery Interventional radiology  Procedures/Studies: 5/5-left knee arthrocentesis in the ED 5/7-attempted left knee arthrocentesis by IR unsuccessful   IR US  DRAIN/INJ MAJOR JOINT/BURSA Result Date: 01/06/2024 INDICATION: 78 year old male referred for left knee joint effusion aspiration EXAM:  ULTRASOUND-GUIDED ASPIRATION MEDICATIONS: None ANESTHESIA/SEDATION: None COMPLICATIONS: None PROCEDURE: Informed written consent was obtained from the patient and the family after a thorough discussion of the procedural risks, benefits and alternatives. All questions were addressed. Maximal Sterile Barrier Technique was utilized including caps, mask, sterile gowns, sterile gloves, sterile drape, hand hygiene and skin antiseptic. A timeout was performed prior to the initiation of the procedure. Patient was positioned supine on his bed. Ultrasound images of the left knee were performed with images stored and sent to PACs. The patient is prepped and draped in the usual sterile fashion. 1% lidocaine  was used for local anesthesia. Using ultrasound guidance, 18 gauge needle was advanced into the small fluid collection associated with the left knee in the suprapatellar region. Needle was repositioned into 4 locations, with no significant fluid aspirated. This is due to the complexity of the fluid. No sample was acquired or vacuum weighted. No complications and no bleeding. FINDINGS: Complex and small volume suprapatellar fluid collection. The complexity of the fluid precludes aspiration.  No sample sent IMPRESSION: Ultrasound-guided suprapatellar fluid aspiration attempt yielding no fluid due to significant complexity of the fluid. Electronically Signed   By: Myrlene Asper D.O.   On: 01/06/2024 11:40   VAS US  LOWER EXTREMITY VENOUS (DVT) (7a-7p) Result Date: 01/05/2024  Lower Venous DVT Study Patient Name:  Gilbert Wilson  Date of Exam:   01/05/2024 Medical Rec #: [id number]          Accession #:    [id number] Date of Birth: 10-13-1945          Patient Gender: M Patient Age:   31 years Exam Location:  [facility] Procedure:      VAS US  LOWER EXTREMITY VENOUS (DVT) Referring Phys: [facility] GOEL --------------------------------------------------------------------------------  Indications: Pain.  Limitations:  Poor  ultrasound/tissue interface. Comparison Study: Previous exam on 11/28/2023 was positive for DVT in LLE CFV, FV                   (prox), and PFV Performing Technologist: Jody Hill RVT, RDMS  Examination Guidelines: A complete evaluation includes B-mode imaging, spectral Doppler, color Doppler, and power Doppler as needed of all accessible portions of each vessel. Bilateral testing is considered an integral part of a complete examination. Limited examinations for reoccurring indications may be performed as noted. The reflux portion of the exam is performed with the patient in reverse Trendelenburg.  +-----+---------------+---------+-----------+----------+--------------+ RIGHTCompressibilityPhasicitySpontaneityPropertiesThrombus Aging +-----+---------------+---------+-----------+----------+--------------+ CFV  Full           Yes      Yes                                 +-----+---------------+---------+-----------+----------+--------------+   +---------+---------------+---------+-----------+----------+--------------+ LEFT     CompressibilityPhasicitySpontaneityPropertiesThrombus Aging +---------+---------------+---------+-----------+----------+--------------+ CFV      Full           Yes      Yes                                 +---------+---------------+---------+-----------+----------+--------------+ SFJ      Full                                                        +---------+---------------+---------+-----------+----------+--------------+ FV Prox  Full           Yes                                          +---------+---------------+---------+-----------+----------+--------------+ FV Mid   Full           Yes      Yes                                 +---------+---------------+---------+-----------+----------+--------------+ FV DistalFull           Yes      Yes                                  +---------+---------------+---------+-----------+----------+--------------+ PFV      Full           Yes      Yes                                 +---------+---------------+---------+-----------+----------+--------------+ POP      Full           Yes      Yes                                 +---------+---------------+---------+-----------+----------+--------------+ Incidental finding of SFA occlusion (mid) reconsitution of flow through collateral in SFA (distal).    Summary: RIGHT: - No evidence of common femoral vein obstruction.  LEFT: - Findings suggest resolution of previously noted thrombus. - There is no evidence of deep vein thrombosis in the lower extremity.  - Incidental finding of short segment SFA occlusion. Flow is reconstituted through collateral in distal SFA.  *See table(s) above for measurements and observations.  Suggest Peripheral Vascular Consult. Electronically signed by Delaney Fearing on 01/05/2024 at 2:53:41 PM.    Final    US  Renal Transplant w/Doppler Result Date: 01/04/2024 CLINICAL DATA:  Acute kidney injury EXAM: ULTRASOUND OF RENAL TRANSPLANT WITH RENAL DOPPLER ULTRASOUND TECHNIQUE: Ultrasound examination of the renal transplant was performed with gray-scale, color and duplex doppler evaluation. COMPARISON:  CT chest abdomen and pelvis 11/20/2023 FINDINGS: Transplant kidney location: RLQ Transplant Kidney: Renal measurements: 12.3 x 7.0 x 6.7 cm = volume: . Normal in size and parenchymal echogenicity. No hydronephrosis. There is in anatomic cyst thin septation measuring 2.0 x 1.8 x 2.2 cm. No peri-transplant fluid collection seen. Color flow in the main renal artery:  Yes Color flow in the main renal vein:  Yes Duplex Doppler Evaluation: Main Renal Artery Velocity: 151.2 cm/sec Main Renal Artery Resistive Index: 0.89 Venous waveform in main renal vein:  Present Intrarenal resistive index in upper pole:  0.74 (normal 0.6-0.8; equivocal 0.8-0.9; abnormal >= 0.9) Intrarenal  resistive index in lower pole: 0.73 (normal 0.6-0.8; equivocal 0.8-0.9; abnormal >= 0.9) Bladder: Not visualized. Other findings:  None. IMPRESSION: 1. No hydronephrosis. 2. Mildly elevated resistive index in the main renal artery. 3. 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 Electronically Signed   By: Tyron Gallon M.D.   On: 01/04/2024 23:43   DG Ankle 2 Views Left Result Date: 01/04/2024 CLINICAL DATA:  Questionable sepsis EXAM: LEFT ANKLE - 2 VIEW COMPARISON:  None Available. FINDINGS: Overlying soft tissue artifact is present. The bones are diffusely osteopenic. There is no acute fracture or dislocation. There are well corticated densities adjacent to the tip of the medial malleolus likely related to old injury erosions are identified. No cortical erosions are identified. Peripheral vascular calcifications are present. IMPRESSION: 1. No acute fracture or dislocation. 2. Diffuse osteopenia. 3. Peripheral vascular disease. Electronically Signed   By: Tyron Gallon M.D.   On: 01/04/2024 18:11   DG Knee Complete 4 Views Left Result Date: 01/04/2024 CLINICAL DATA:  Question sepsis.  Left knee swelling. EXAM: LEFT KNEE - COMPLETE 4+ VIEW COMPARISON:  None Available. FINDINGS: Large joint effusion within the left knee. No acute bony abnormality. Specifically, no fracture, subluxation, or dislocation. Early degenerative changes in the left knee. Diffuse vascular calcifications. IMPRESSION: Early osteoarthritis. Large joint effusion. No acute bony abnormality. Electronically Signed   By: Janeece Mechanic M.D.   On: 01/04/2024 18:09   DG Chest 1 View Result Date: 01/04/2024 CLINICAL DATA:  Sepsis EXAM: CHEST  1 VIEW COMPARISON:  Chest x-ray 11/30/2023 FINDINGS: The heart is enlarged. Lung volumes are low. The lungs are clear. There is no pleural effusion or pneumothorax. No acute fractures are seen. There are surgical clips in the upper abdomen. IMPRESSION: Cardiomegaly. No acute cardiopulmonary process.  Electronically Signed   By: Tyron Gallon M.D.   On: 01/04/2024 18:04   DG Hip Unilat W or Wo Pelvis 2-3 Views Left Result Date: 01/04/2024 CLINICAL DATA:  Questionable sepsis EXAM: DG HIP (WITH OR WITHOUT PELVIS) 2-3V LEFT COMPARISON:  None Available. FINDINGS: The bones are diffusely osteopenic. There is no evidence of hip fracture or dislocation. Joint spaces are maintained. There is a large stool ball within the rectum. Peripheral vascular  calcifications are present. IMPRESSION: 1. No acute fracture or dislocation. 2. Large stool ball within the rectum worrisome for impaction. Electronically Signed   By: Tyron Gallon M.D.   On: 01/04/2024 18:03       The results of significant diagnostics from this hospitalization (including imaging, microbiology, ancillary and laboratory) are listed below for reference.     Microbiology: Recent Results (from the past 240 hours)  Blood Culture (routine x 2)     Status: None (Preliminary result)   Collection Time: 01/04/24  5:58 PM   Specimen: BLOOD  Result Value Ref Range Status   Specimen Description BLOOD LEFT ANTECUBITAL  Final   Special Requests   Final    BOTTLES DRAWN AEROBIC AND ANAEROBIC Blood Culture results may not be optimal due to an inadequate volume of blood received in culture bottles   Culture   Final    NO GROWTH 2 DAYS Performed at [facility], [address], [place], [place] [address]    Report Status PENDING  Incomplete  Resp panel by RT-PCR (RSV, Flu A&B, Covid) Anterior Nasal Swab     Status: None   Collection Time: 01/04/24  6:58 PM   Specimen: Anterior Nasal Swab  Result Value Ref Range Status   SARS Coronavirus 2 by RT PCR NEGATIVE NEGATIVE Final   Influenza A by PCR NEGATIVE NEGATIVE Final   Influenza B by PCR NEGATIVE NEGATIVE Final    Comment: (NOTE) The Xpert Xpress SARS-CoV-2/FLU/RSV plus assay is intended as an aid in the diagnosis of influenza from Nasopharyngeal swab specimens and should not be used as  a sole basis for treatment. Nasal washings and aspirates are unacceptable for Xpert Xpress SARS-CoV-2/FLU/RSV testing.  Fact Sheet for Patients: BloggerCourse.com  Fact Sheet for Healthcare Providers: SeriousBroker.it  This test is not yet approved or cleared by the [place]  FDA and has been authorized for detection and/or diagnosis of SARS-CoV-2 by FDA under an Emergency Use Authorization (EUA). This EUA will remain in effect (meaning this test can be used) for the duration of the COVID-19 declaration under Section 564(b)(1) of the Act, 21 U.S.C. section 360bbb-3(b)(1), unless the authorization is terminated or revoked.     Resp Syncytial Virus by PCR NEGATIVE NEGATIVE Final    Comment: (NOTE) Fact Sheet for Patients: BloggerCourse.com  Fact Sheet for Healthcare Providers: SeriousBroker.it  This test is not yet approved or cleared by the [place]  FDA and has been authorized for detection and/or diagnosis of SARS-CoV-2 by FDA under an Emergency Use Authorization (EUA). This EUA will remain in effect (meaning this test can be used) for the duration of the COVID-19 declaration under Section 564(b)(1) of the Act, 21 U.S.C. section 360bbb-3(b)(1), unless the authorization is terminated or revoked.  Performed at [facility], [address] [place], [place] [address]   Blood Culture (routine x 2)     Status: None (Preliminary result)   Collection Time: 01/04/24  7:11 PM   Specimen: BLOOD LEFT FOREARM  Result Value Ref Range Status   Specimen Description BLOOD LEFT FOREARM  Final   Special Requests   Final    BOTTLES DRAWN AEROBIC AND ANAEROBIC Blood Culture adequate volume   Culture   Final    NO GROWTH 2 DAYS Performed at [facility], [address], [place], [place] [address]    Report Status PENDING  Incomplete  Body fluid culture w Gram Stain      Status: None (Preliminary result)  Collection Time: 01/05/24  7:54 AM   Specimen: Path fluid; Body Fluid  Result Value Ref Range Status   Specimen Description FLUID  Final   Special Requests Immunocompromised  Final   Gram Stain   Final    MODERATE WBC PRESENT, PREDOMINANTLY PMN NO ORGANISMS SEEN    Culture   Final    NO GROWTH 1 DAY Performed at [facility], [address], [place], [place] [address]    Report Status PENDING  Incomplete     Labs:  CBC: Recent Labs  Lab 01/04/24 1758 01/05/24 0557 01/06/24 0500  WBC 6.0 5.1 5.0  NEUTROABS 4.2  --   --   HGB 8.8* 8.4* 8.1*  HCT 29.8* 27.8* 26.6*  MCV 81.9 80.6 79.2*  PLT 369 306 355   BMP &GFR Recent Labs  Lab 01/04/24 1758 01/05/24 0557 01/06/24 0500  NA 145 145 144  K 4.4 4.1 4.2  CL 119* 122* 117*  CO2 18* 15* 18*  GLUCOSE 159* 131* 144*  BUN 49* 46* 42*  CREATININE 2.01* 1.80* 1.70*  CALCIUM  10.5* 10.4* 10.3  MG  --   --  1.8  PHOS  --  2.9 2.6   Estimated Creatinine Clearance: 41.8 mL/min (A) (by C-G formula based on SCr of 1.7 mg/dL (H)). Liver & Pancreas: Recent Labs  Lab 01/04/24 1758 01/06/24 0500  AST 13*  --   ALT 13  --   ALKPHOS 92  --   BILITOT 0.7  --   PROT 6.8  --   ALBUMIN 2.5* 2.1*   No results for input(s): "LIPASE", "AMYLASE" in the last 168 hours. No results for input(s): "AMMONIA" in the last 168 hours. Diabetic: No results for input(s): "HGBA1C" in the last 72 hours. Recent Labs  Lab 01/05/24 1314 01/05/24 1643 01/05/24 2140 01/06/24 0619 01/06/24 1223  GLUCAP 102* 151* 180* 94 141*   Cardiac Enzymes: No results for input(s): "CKTOTAL", "CKMB", "CKMBINDEX", "TROPONINI" in the last 168 hours. No results for input(s): "PROBNP" in the last 8760 hours. Coagulation Profile: Recent Labs  Lab 01/04/24 1758 01/05/24 0557  INR 1.7* 1.6*   Thyroid Function Tests: No results for input(s): "TSH", "T4TOTAL", "FREET4", "T3FREE", "THYROIDAB" in the last 72  hours. Lipid Profile: No results for input(s): "CHOL", "HDL", "LDLCALC", "TRIG", "CHOLHDL", "LDLDIRECT" in the last 72 hours. Anemia Panel: No results for input(s): "VITAMINB12", "FOLATE", "FERRITIN", "TIBC", "IRON", "RETICCTPCT" in the last 72 hours. Urine analysis:    Component Value Date/Time   COLORURINE YELLOW 01/04/2024 2153   APPEARANCEUR CLEAR 01/04/2024 2153   LABSPEC 1.010 01/04/2024 2153   PHURINE 6.0 01/04/2024 2153   GLUCOSEU NEGATIVE 01/04/2024 2153   HGBUR NEGATIVE 01/04/2024 2153   BILIRUBINUR NEGATIVE 01/04/2024 2153   KETONESUR NEGATIVE 01/04/2024 2153   PROTEINUR NEGATIVE 01/04/2024 2153   NITRITE NEGATIVE 01/04/2024 2153   LEUKOCYTESUR NEGATIVE 01/04/2024 2153   Sepsis Labs: Invalid input(s): "PROCALCITONIN", "LACTICIDVEN"   SIGNED:  Theadore Finger, MD  [facility] 01/06/2024, 1:36 PM

## 2024-01-06 NOTE — Plan of Care (Signed)

## 2024-01-06 NOTE — Progress Notes (Signed)
 Pharmacy Antibiotic Note  Gilbert Wilson is a 78 y.o. male admitted on 01/04/2024 with  septic arthritis .  Pharmacy has been consulted for vancomycin  dosing. Renal function improving.   Plan: Vancomycin  1250 mg IV Q24h (eAUC 500 , goal AUC 400-550, Scr 1.7, Vd 0.72) Trend WBC, fever, renal function F/u cultures, clinical progress, levels as indicated De-escalate when able  Height: 6\' 1"  (185.4 cm) Weight: 88.9 kg (196 lb) IBW/kg (Calculated) : 79.9  Temp (24hrs), Avg:98.3 F (36.8 C), Min:97.8 F (36.6 C), Max:98.6 F (37 C)  Recent Labs  Lab 01/04/24 1758 01/04/24 1816 01/04/24 1930 01/04/24 2045 01/05/24 0557 01/06/24 0500  WBC 6.0  --   --   --  5.1 5.0  CREATININE 2.01*  --   --   --  1.80* 1.70*  LATICACIDVEN  --  1.2 >15.0* 1.1  --   --     Estimated Creatinine Clearance: 41.8 mL/min (A) (by C-G formula based on SCr of 1.7 mg/dL (H)).    Allergies  Allergen Reactions   Linezolid Other (See Comments)    Seizures   Levaquin [Levofloxacin] Itching   Lisinopril Cough    Antimicrobials this admission: Vancomycin  5/5 >>   Thank you for allowing pharmacy to be a part of this patient's care.  Claudia Cuff, PharmD, BCPS Clinical Pharmacist

## 2024-01-07 LAB — CYCLOSPORINE: Cyclosporine, LabCorp: 154 ng/mL (ref 100–400)

## 2024-01-08 LAB — BODY FLUID CULTURE W GRAM STAIN: Culture: NO GROWTH

## 2024-01-09 LAB — CULTURE, BLOOD (ROUTINE X 2)
Culture: NO GROWTH
Culture: NO GROWTH
Special Requests: ADEQUATE

## 2024-01-11 LAB — MYCOPHENOLIC ACID (CELLCEPT)
MPA Glucuronide: 92 ug/mL (ref 15–125)
MPA: 3.8 ug/mL — ABNORMAL HIGH (ref 1.0–3.5)

## 2024-02-06 IMAGING — CT CT HEAD W/O CM
6 of 8 series · 18 of 47 positions shown, 19 images · non-contrast
Comparison: 11/24/2021

CLINICAL DATA: Altered mental status



[Series 3: head bone · axial · 0.47mm/px · z∈[-120,+6]mm · 7 of 85 slices shown]
[im 11/85  bone]
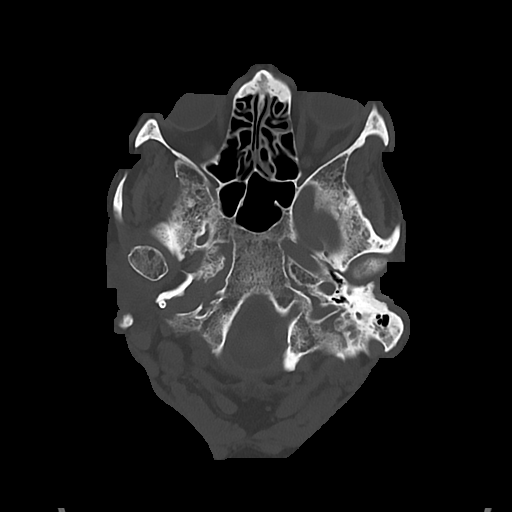
[im 22/85  bone]
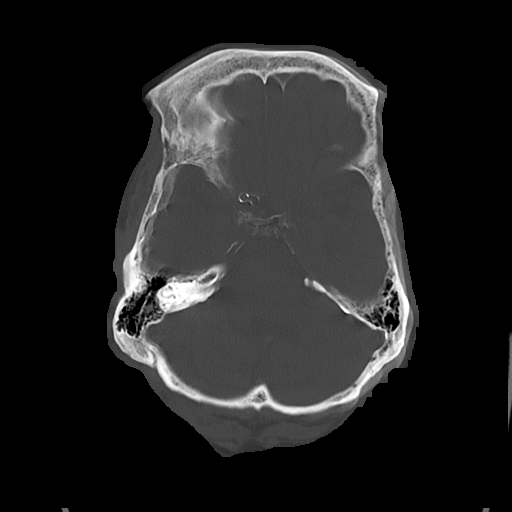
[im 32/85  bone]
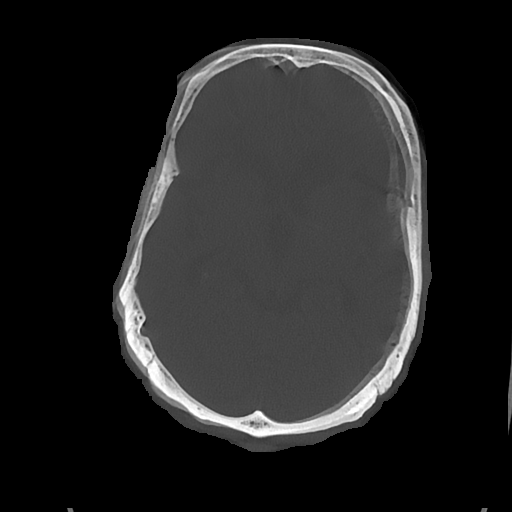
[im 43/85  bone]
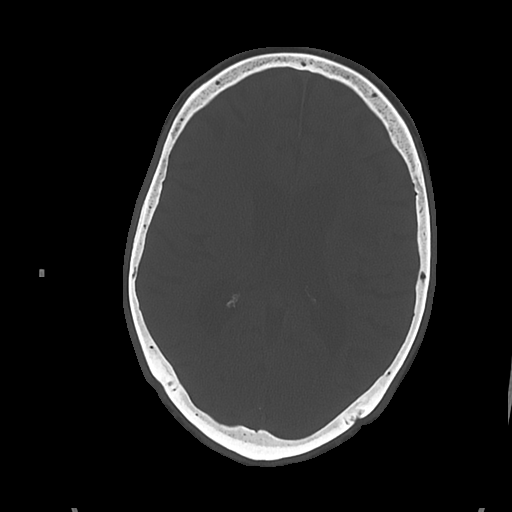
[im 53/85  bone]
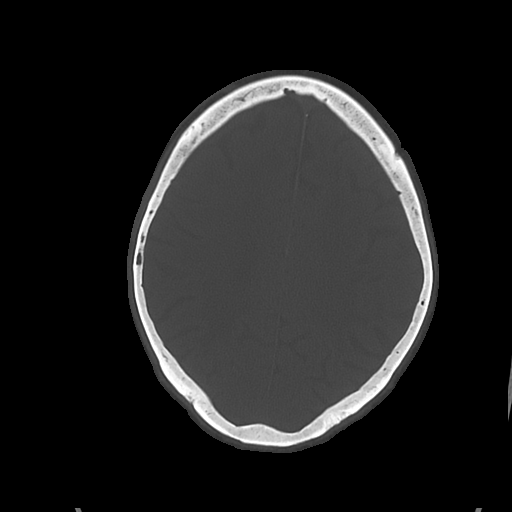
[im 64/85  bone]
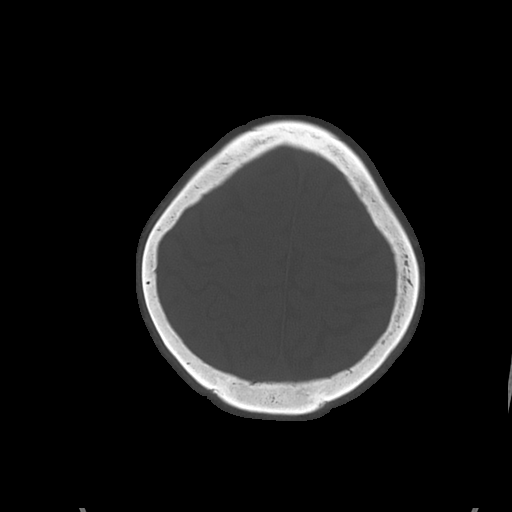
[im 74/85  bone]
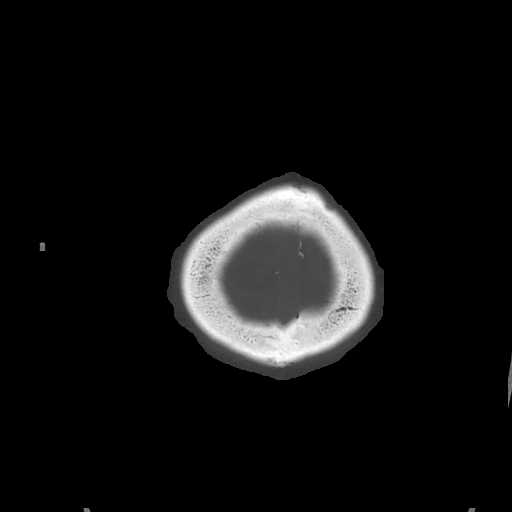

[Series 4: head wo · axial · 0.47mm/px · z∈[-85,-30]mm · 2 of 34 slices shown, 3 images (1 of 3)]
[im 12/34  brain]
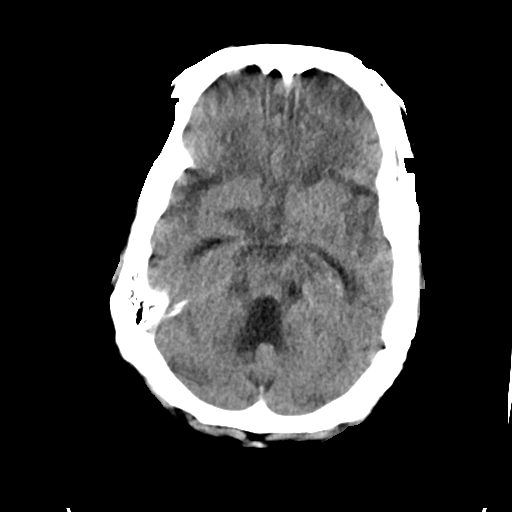
[im 12/34  bone]
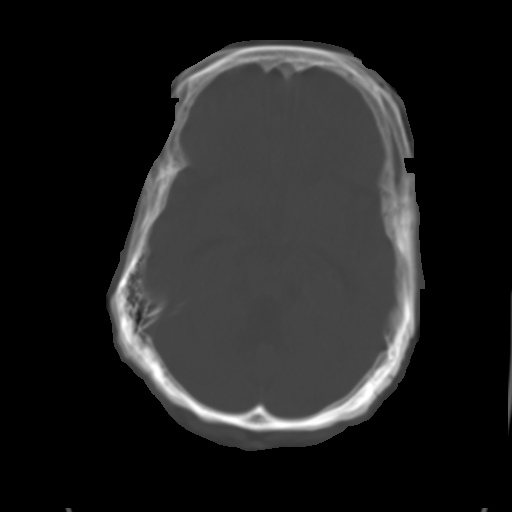
[im 23/34  brain]
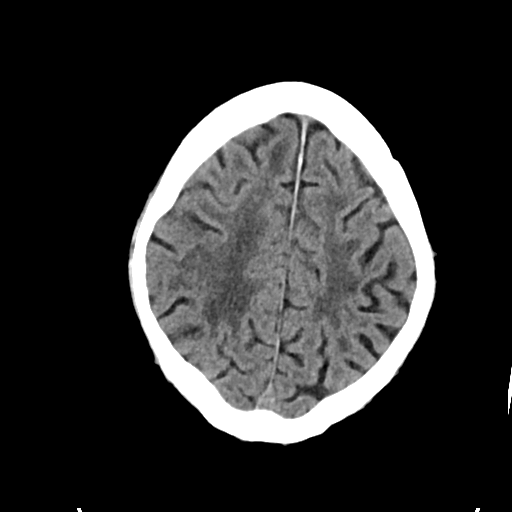

[Series 5: cor soft · coronal · 0.36mm/px · 3 of 79 slices shown (1 of 2)]
[im 27/79  brain]
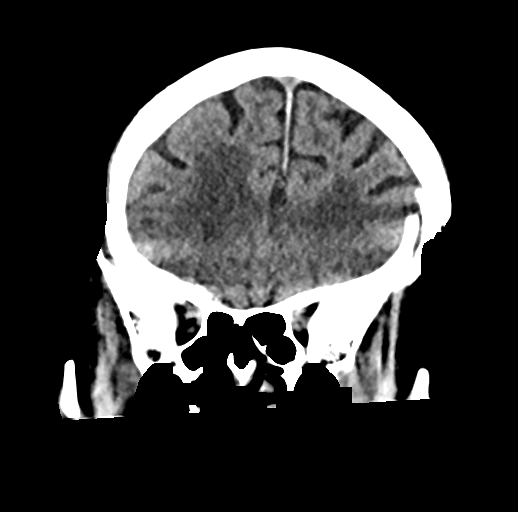
[im 35/79  brain]
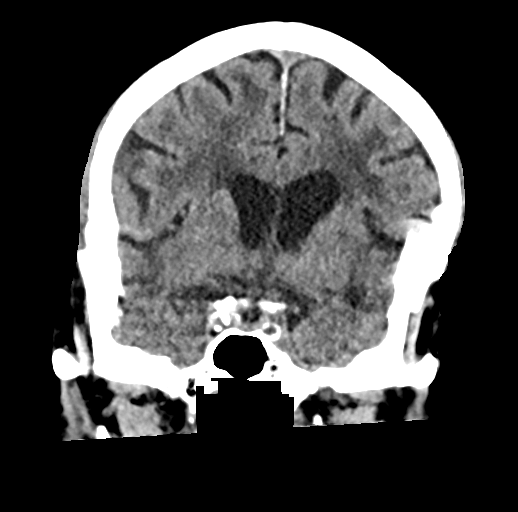
[im 44/79  brain]
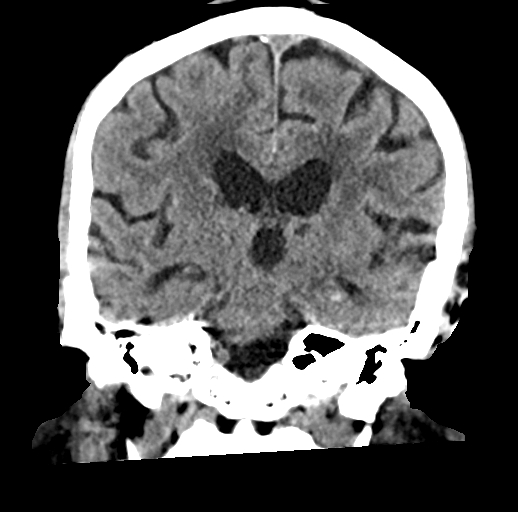

[Series 7: head wo · axial · 0.54mm/px · z∈[-85,-30]mm · 2 of 34 slices shown (2 of 3)]
[im 12/34  brain]
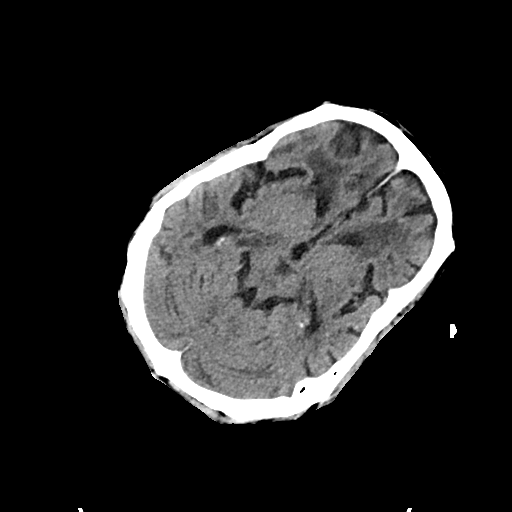
[im 23/34  brain]
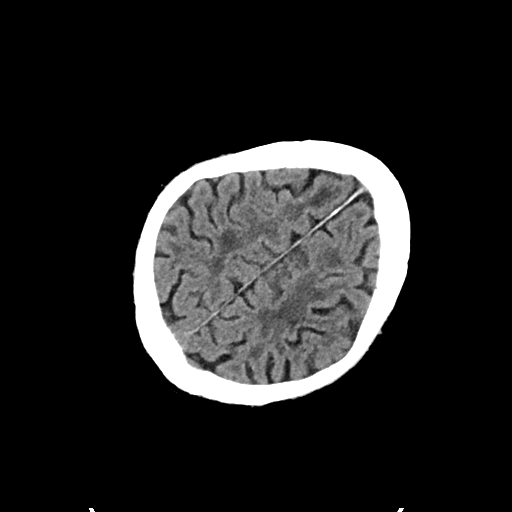

[Series 9: head wo · axial · 0.45mm/px · z∈[-103,-43]mm · 2 of 36 slices shown (3 of 3)]
[im 12/36  brain]
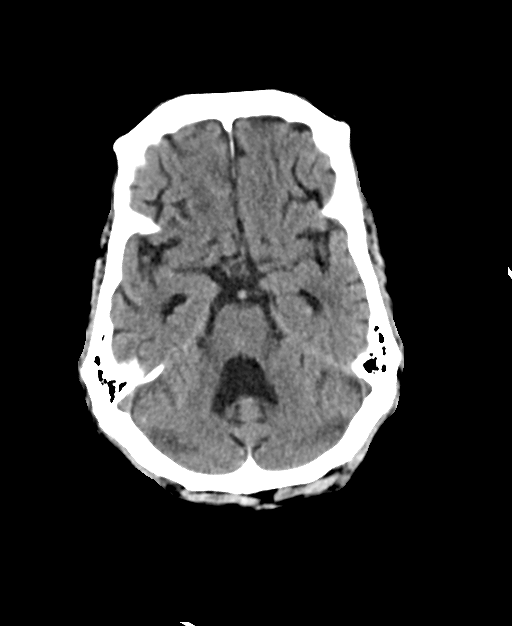
[im 24/36  brain]
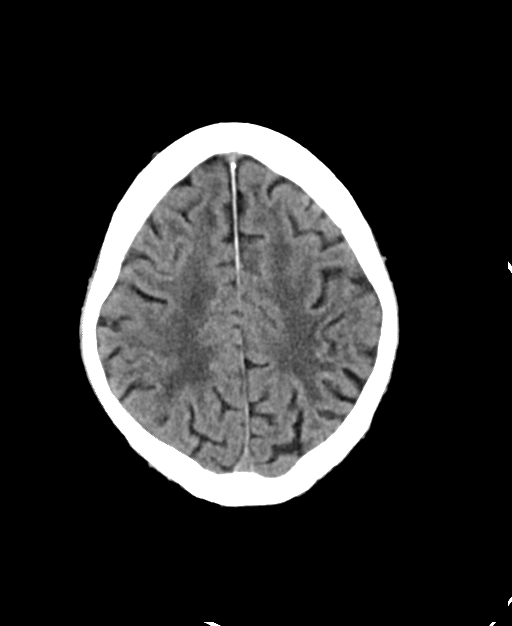

[Series 10: cor soft · sagittal · 0.39mm/px · 2 of 94 slices shown (2 of 2)]
[im 32/94  brain]
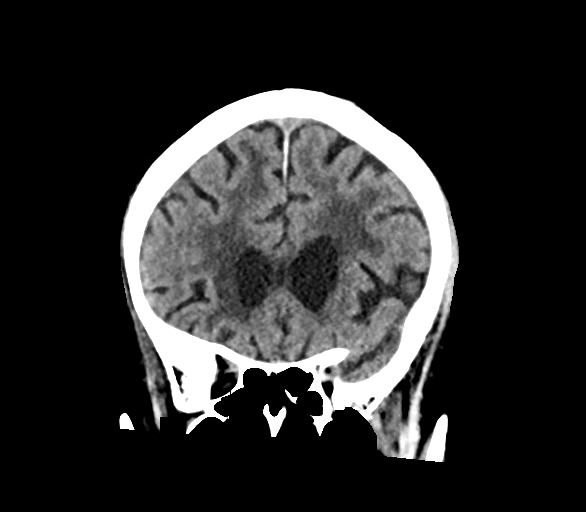
[im 63/94  brain]
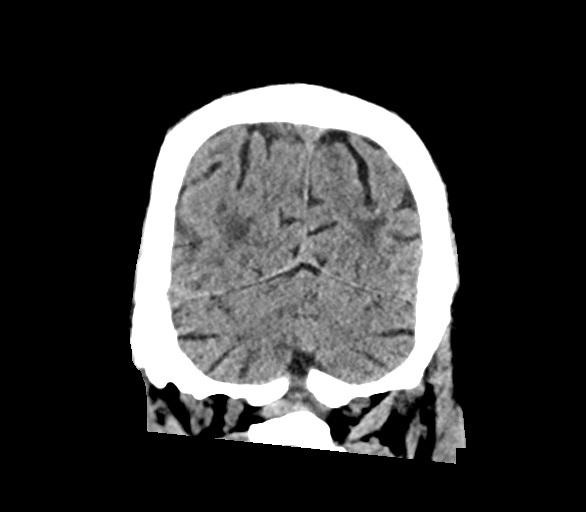

[18 of 47 positions shown; findings below may reference images not displayed]

FINDINGS: Motion artifacts limit evaluation.

Brain: No acute intracranial findings are seen. Cortical sulci are
prominent. There is decreased density in the periventricular and
subcortical white matter. Old lacunar infarcts are seen in the basal
ganglia. Calcifications are seen in the basal ganglia.

Vascular: Unremarkable.

Skull: Unremarkable.

Sinuses/Orbits: There is mild mucosal thickening in the ethmoid
sinus.

Other: None
IMPRESSION: No acute intracranial findings are seen. Atrophy. Small-vessel
disease.

Mild chronic ethmoid sinusitis.

## 2024-05-12 ENCOUNTER — Emergency Department (HOSPITAL_COMMUNITY)

## 2024-05-12 ENCOUNTER — Emergency Department (HOSPITAL_COMMUNITY)
Admission: EM | Admit: 2024-05-12 | Discharge: 2024-05-13 | Disposition: A | Discharge status: Home / Self Care | Attending: Emergency Medicine | Admitting: Emergency Medicine

## 2024-05-12 ENCOUNTER — Other Ambulatory Visit: Payer: Self-pay

## 2024-05-12 DIAGNOSIS — E119 Type 2 diabetes mellitus without complications: Secondary | ICD-10-CM | POA: Insufficient documentation

## 2024-05-12 DIAGNOSIS — Z7901 Long term (current) use of anticoagulants: Secondary | ICD-10-CM | POA: Insufficient documentation

## 2024-05-12 DIAGNOSIS — F0390 Unspecified dementia without behavioral disturbance: Secondary | ICD-10-CM | POA: Diagnosis not present

## 2024-05-12 DIAGNOSIS — E870 Hyperosmolality and hypernatremia: Secondary | ICD-10-CM | POA: Insufficient documentation

## 2024-05-12 DIAGNOSIS — I482 Chronic atrial fibrillation, unspecified: Secondary | ICD-10-CM | POA: Diagnosis not present

## 2024-05-12 DIAGNOSIS — N390 Urinary tract infection, site not specified: Secondary | ICD-10-CM | POA: Diagnosis present

## 2024-05-12 DIAGNOSIS — I10 Essential (primary) hypertension: Secondary | ICD-10-CM | POA: Insufficient documentation

## 2024-05-12 DIAGNOSIS — R519 Headache, unspecified: Secondary | ICD-10-CM | POA: Insufficient documentation

## 2024-05-12 LAB — URINALYSIS, W/ REFLEX TO CULTURE (INFECTION SUSPECTED)
Bilirubin Urine: NEGATIVE
Glucose, UA: NEGATIVE mg/dL
Ketones, ur: NEGATIVE mg/dL
Nitrite: NEGATIVE
Protein, ur: 100 mg/dL — AB
Specific Gravity, Urine: 1.012 (ref 1.005–1.030)
WBC, UA: >50 WBC/hpf (ref 0–5)
pH: 6 (ref 5.0–8.0)

## 2024-05-12 LAB — COMPREHENSIVE METABOLIC PANEL WITH GFR
ALT: 8 U/L (ref 0–44)
AST: 12 U/L — ABNORMAL LOW (ref 15–41)
Albumin: 2.4 g/dL — ABNORMAL LOW (ref 3.5–5.0)
Alkaline Phosphatase: 93 U/L (ref 38–126)
Anion gap: 10 (ref 5–15)
BUN: 36 mg/dL — ABNORMAL HIGH (ref 8–23)
CO2: 19 mmol/L — ABNORMAL LOW (ref 22–32)
Calcium: 9.6 mg/dL (ref 8.9–10.3)
Chloride: 117 mmol/L — ABNORMAL HIGH (ref 98–111)
Creatinine, Ser: 1.64 mg/dL — ABNORMAL HIGH (ref 0.61–1.24)
GFR, Estimated: 43 mL/min — ABNORMAL LOW (ref >60)
Glucose, Bld: 148 mg/dL — ABNORMAL HIGH (ref 70–99)
Potassium: 4 mmol/L (ref 3.5–5.1)
Sodium: 146 mmol/L — ABNORMAL HIGH (ref 135–145)
Total Bilirubin: 0.5 mg/dL (ref 0.0–1.2)
Total Protein: 6.4 g/dL — ABNORMAL LOW (ref 6.5–8.1)

## 2024-05-12 LAB — CBC
HCT: 33.4 % — ABNORMAL LOW (ref 39.0–52.0)
Hemoglobin: 10.4 g/dL — ABNORMAL LOW (ref 13.0–17.0)
MCH: 24.9 pg — ABNORMAL LOW (ref 26.0–34.0)
MCHC: 31.1 g/dL (ref 30.0–36.0)
MCV: 80.1 fL (ref 80.0–100.0)
Platelets: 282 K/uL (ref 150–400)
RBC: 4.17 MIL/uL — ABNORMAL LOW (ref 4.22–5.81)
RDW: 16 % — ABNORMAL HIGH (ref 11.5–15.5)
WBC: 5.8 K/uL (ref 4.0–10.5)
nRBC: 0 % (ref 0.0–0.2)

## 2024-05-12 LAB — AMMONIA: Ammonia: 14 umol/L (ref 9–35)

## 2024-05-12 LAB — MAGNESIUM: Magnesium: 1.8 mg/dL (ref 1.7–2.4)

## 2024-05-12 NOTE — ED Provider Notes (Signed)
 [place] EMERGENCY DEPARTMENT AT [facility] Provider Note HPI Gilbert Wilson is a 78 y.o. 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  History is provided by family at bedside as the patient is mostly nonverbal.  Family attempted to wake the patient up from a nap this evening.  They were unable to arouse him for approximately 3 to 4 minutes despite painful stimulation.  Patient appears more tired than usual per family.  He has been eating and drinking well.  Denies recent fevers, cough, vomiting, diarrhea.  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  [facility])    Past Surgical History:  Procedure Laterality Date   KIDNEY TRANSPLANT  2016   same time as liver   LIVER TRANSPLANT N/A 2016   SVT ABLATION  2017    Review of Systems Pertinent positives and negative findings are listed as part of the History of Present Illness and MDM  Physical Exam Vitals:   05/12/24 2109 05/12/24 2111 05/12/24 2230  BP: (!) 149/61  123/64  Pulse: 80  76  Resp: 19  (!) 0  Temp: 97.9 F (36.6 C)    TempSrc: Oral    SpO2: 100%  100%  Weight:  89.8 kg   Height:  6' 1 (1.854 m)      Constitutional Nursing notes reviewed Vital signs reviewed  HEENT No obvious trauma Pupils round, equal, and reactive to light. EOMI Neck supple  Respiratory Effort normal Breathing well on  room air CTAB  CV Normal rate and rhythm  No pitting edema  Abdomen Soft, non-tender, non-distended No peritonitis  Skin Stage II decubitus ulcer that is well-appearing with no evidence of purulent discharge or significant surrounding erythema Left heel wound without evidence of surrounding erythema or purulent discharge  MSK Bilateral upper extremity contractures  Neuro Awake and alert Pupils cross midline Moving all extremities minimally    MDM:  Initial Differential Diagnoses includes toxic/metabolic encephalopathy, seizure, intracranial bleed, electrolyte normality, UTI  I reviewed the patient's vitals, the nursing triage note and evaluated the patient at bedside.  Patient presents today for a period of unresponsiveness.  History taken with the patient's wife and daughter who are at bedside.  They went to wake him up from a nap and were unable to wake him up for approximately 3 to 4 minutes.  Patient has a history of seizures where he stares off.  He never has general tonic-clonic shaking.  His last seizure was approximately year ago.  He takes Keppra  for this.  On initial exam, he appeared postictal, although it is difficult to assess given his mental status.  He is able to follow basic commands but is nonverbal.  I reviewed the patient's external records which show that he has a significant medical history that complicates his stay in the emergency department.  He has severe dementia and is bedbound  at baseline.  He has a seizure disorder and prior renal/liver transplants that were done in [place]  in 2016.  He has a history of recurrent UTIs and is on Eliquis  for A-fib/DVTs.  On reevaluation, the patient appears to be back at baseline.  He can follow commands in all 4 extremities.  He is able to tell me no when I ask him if he has a headache or he has any pain.  Labs interpreted by myself which show a hemoglobin of 10.4 which is improved from baseline.  No evidence of leukocytosis.  Family denies recent fevers, cough, vomiting, diarrhea.  I evaluated his decubitus ulcer and heel wound which appear chronic and not acutely infected.  CMP overall reassuring.  Creatinine is at baseline. Bicarb of 19 but the patient has a baseline metabolic acidosis when looking at his prior labs.  UA shows concern for cystitis and I will treat the patient with cefpodoxime .    CT head reviewed by myself shows no evidence of intracranial bleed or other acute findings.  EKG reviewed by myself shows no evidence of acute ischemic changes, high-grade conduction block or new onset arrhythmia.  Patient discharged home with cefpodoxime  and strict return precautions.    Procedures: Procedures  Medications administered in the ED: Medications - No data to display   Impression: 1. Lower urinary tract infectious disease      Patient's presentation is most consistent with acute presentation with potential threat to life or bodily function.  Disposition: ED Disposition:  Discharge   Discharge: Patient is felt to be medically appropriate for discharge at this time. Patient was instructed to follow up with their primary care doctor/specialists listed above for re-evaluation. Patient was given strict return precautions.  ED Discharge Orders          Ordered    cefpodoxime  (VANTIN ) 200 MG tablet  2 times daily        05/13/24 0026                  Dionisio Blunt, MD 05/13/24 9970    Gilbert Lamar BROCKS, MD 05/15/24 [phone]

## 2024-05-12 NOTE — ED Notes (Signed)
 Pt in CT.

## 2024-05-12 NOTE — ED Triage Notes (Signed)
 BIBEMS for being hard to arouse for 3-71min. Pt complains about headache. Pt at baseline upon arrival to ED. BG 165 from EMS.

## 2024-05-13 MED ORDER — CEFPODOXIME PROXETIL 200 MG PO TABS: 200.0000 mg | ORAL_TABLET | Freq: Two times a day (BID) | ORAL | 0 refills | Status: AC

## 2024-05-13 NOTE — Discharge Instructions (Signed)
 Gilbert Wilson was seen in the emergency department today for being difficult to arouse.  While he was here we did a physical exam, labs, and head CT which were all reassuring.  He does have evidence of UTI.  Please take cefpodoxime , 200 mg twice a day for 10 days.  Please follow-up with your primary care doctor for reevaluation.  Come back to the emergency department if he has a fever, worsening mental status, new weakness or if you have any other reason to believe needs emergency care.

## 2024-05-14 LAB — URINE CULTURE: Culture: >=100000 — AB

## 2024-05-15 ENCOUNTER — Telehealth (HOSPITAL_BASED_OUTPATIENT_CLINIC_OR_DEPARTMENT_OTHER): Payer: Self-pay | Admitting: *Deleted

## 2024-05-15 NOTE — Telephone Encounter (Signed)
 Post ED Visit - Positive Culture Follow-up  Culture report reviewed by antimicrobial stewardship pharmacist: [facility] [id number] Pharm.D. []  Venetia Gully, Pharm.D., BCPS AQ-ID []  Garrel Crews, Pharm.D., BCPS []  Almarie Lunger, Pharm.D., BCPS []  [place], [address].D., BCPS, AAHIVP []  Rosaline Bihari, Pharm.D., BCPS, AAHIVP []  Vernell Meier, PharmD, BCPS []  Latanya Hint, PharmD, BCPS []  Donald Medley, PharmD, BCPS []  Rocky Bold, PharmD []  Dorothyann Alert, PharmD, BCPS [x]  Dorn Poot, PharmD  [facility] []  Rosaline Edison, PharmD []  Romona Bliss, PharmD []  Dolphus Roller, PharmD []  Veva Seip, Rph []  Vernell Daunt) Leonce, PharmD []  Eva Allis, PharmD []  Rosaline Millet, PharmD []  Iantha Batch, PharmD []  Arvin Gauss, PharmD []  Wanda Hasting, PharmD []  Ronal Rav, PharmD []  Rocky Slade, PharmD []  Bard Jeans, PharmD   Positive urine culture Treated with Cefpodoxime  Proxetil, organism sensitive to the same and no further patient follow-up is required at this time.  Gilbert Wilson 05/15/2024, 10:33 AM

## 2024-08-02 ENCOUNTER — Inpatient Hospital Stay (HOSPITAL_COMMUNITY)
Admission: EM | Admit: 2024-08-02 | Discharge: 2024-08-13 | DRG: 637 | Disposition: A | Attending: Internal Medicine | Admitting: Internal Medicine

## 2024-08-02 ENCOUNTER — Emergency Department (HOSPITAL_COMMUNITY)

## 2024-08-02 ENCOUNTER — Inpatient Hospital Stay (HOSPITAL_COMMUNITY)

## 2024-08-02 DIAGNOSIS — L89159 Pressure ulcer of sacral region, unspecified stage: Secondary | ICD-10-CM | POA: Diagnosis not present

## 2024-08-02 DIAGNOSIS — D638 Anemia in other chronic diseases classified elsewhere: Secondary | ICD-10-CM | POA: Diagnosis present

## 2024-08-02 DIAGNOSIS — K626 Ulcer of anus and rectum: Secondary | ICD-10-CM | POA: Diagnosis not present

## 2024-08-02 DIAGNOSIS — I4891 Unspecified atrial fibrillation: Secondary | ICD-10-CM | POA: Diagnosis present

## 2024-08-02 DIAGNOSIS — N320 Bladder-neck obstruction: Secondary | ICD-10-CM | POA: Diagnosis present

## 2024-08-02 DIAGNOSIS — K521 Toxic gastroenteritis and colitis: Secondary | ICD-10-CM | POA: Diagnosis present

## 2024-08-02 DIAGNOSIS — E43 Unspecified severe protein-calorie malnutrition: Secondary | ICD-10-CM | POA: Diagnosis present

## 2024-08-02 DIAGNOSIS — D649 Anemia, unspecified: Secondary | ICD-10-CM | POA: Diagnosis not present

## 2024-08-02 DIAGNOSIS — L97429 Non-pressure chronic ulcer of left heel and midfoot with unspecified severity: Secondary | ICD-10-CM | POA: Diagnosis not present

## 2024-08-02 DIAGNOSIS — K611 Rectal abscess: Secondary | ICD-10-CM | POA: Diagnosis not present

## 2024-08-02 DIAGNOSIS — I6930 Unspecified sequelae of cerebral infarction: Secondary | ICD-10-CM | POA: Diagnosis not present

## 2024-08-02 DIAGNOSIS — L89319 Pressure ulcer of right buttock, unspecified stage: Secondary | ICD-10-CM | POA: Diagnosis not present

## 2024-08-02 DIAGNOSIS — F0390 Unspecified dementia without behavioral disturbance: Secondary | ICD-10-CM | POA: Diagnosis not present

## 2024-08-02 DIAGNOSIS — E119 Type 2 diabetes mellitus without complications: Secondary | ICD-10-CM

## 2024-08-02 DIAGNOSIS — M4628 Osteomyelitis of vertebra, sacral and sacrococcygeal region: Secondary | ICD-10-CM | POA: Diagnosis present

## 2024-08-02 DIAGNOSIS — R5383 Other fatigue: Secondary | ICD-10-CM | POA: Diagnosis present

## 2024-08-02 DIAGNOSIS — N179 Acute kidney failure, unspecified: Principal | ICD-10-CM

## 2024-08-02 DIAGNOSIS — I69351 Hemiplegia and hemiparesis following cerebral infarction affecting right dominant side: Secondary | ICD-10-CM | POA: Diagnosis not present

## 2024-08-02 DIAGNOSIS — I728 Aneurysm of other specified arteries: Secondary | ICD-10-CM | POA: Diagnosis present

## 2024-08-02 DIAGNOSIS — Z794 Long term (current) use of insulin: Secondary | ICD-10-CM | POA: Diagnosis not present

## 2024-08-02 DIAGNOSIS — L89210 Pressure ulcer of right hip, unstageable: Secondary | ICD-10-CM | POA: Diagnosis present

## 2024-08-02 DIAGNOSIS — E870 Hyperosmolality and hypernatremia: Secondary | ICD-10-CM

## 2024-08-02 DIAGNOSIS — Y830 Surgical operation with transplant of whole organ as the cause of abnormal reaction of the patient, or of later complication, without mention of misadventure at the time of the procedure: Secondary | ICD-10-CM | POA: Diagnosis present

## 2024-08-02 DIAGNOSIS — I129 Hypertensive chronic kidney disease with stage 1 through stage 4 chronic kidney disease, or unspecified chronic kidney disease: Secondary | ICD-10-CM | POA: Diagnosis not present

## 2024-08-02 DIAGNOSIS — L89150 Pressure ulcer of sacral region, unstageable: Secondary | ICD-10-CM | POA: Diagnosis present

## 2024-08-02 DIAGNOSIS — E872 Acidosis, unspecified: Secondary | ICD-10-CM | POA: Diagnosis present

## 2024-08-02 DIAGNOSIS — N1832 Chronic kidney disease, stage 3b: Secondary | ICD-10-CM | POA: Diagnosis present

## 2024-08-02 DIAGNOSIS — Z940 Kidney transplant status: Secondary | ICD-10-CM | POA: Diagnosis not present

## 2024-08-02 DIAGNOSIS — D631 Anemia in chronic kidney disease: Secondary | ICD-10-CM | POA: Diagnosis present

## 2024-08-02 DIAGNOSIS — Z7189 Other specified counseling: Secondary | ICD-10-CM | POA: Diagnosis not present

## 2024-08-02 DIAGNOSIS — E1169 Type 2 diabetes mellitus with other specified complication: Secondary | ICD-10-CM | POA: Diagnosis present

## 2024-08-02 DIAGNOSIS — T8619 Other complication of kidney transplant: Secondary | ICD-10-CM | POA: Diagnosis present

## 2024-08-02 DIAGNOSIS — M6282 Rhabdomyolysis: Secondary | ICD-10-CM | POA: Diagnosis not present

## 2024-08-02 DIAGNOSIS — N189 Chronic kidney disease, unspecified: Secondary | ICD-10-CM | POA: Diagnosis not present

## 2024-08-02 DIAGNOSIS — R410 Disorientation, unspecified: Secondary | ICD-10-CM

## 2024-08-02 DIAGNOSIS — G40909 Epilepsy, unspecified, not intractable, without status epilepticus: Secondary | ICD-10-CM | POA: Diagnosis present

## 2024-08-02 DIAGNOSIS — L98429 Non-pressure chronic ulcer of back with unspecified severity: Secondary | ICD-10-CM | POA: Diagnosis present

## 2024-08-02 DIAGNOSIS — R6510 Systemic inflammatory response syndrome (SIRS) of non-infectious origin without acute organ dysfunction: Secondary | ICD-10-CM | POA: Diagnosis not present

## 2024-08-02 DIAGNOSIS — Z515 Encounter for palliative care: Secondary | ICD-10-CM | POA: Diagnosis not present

## 2024-08-02 DIAGNOSIS — Z7401 Bed confinement status: Secondary | ICD-10-CM | POA: Diagnosis not present

## 2024-08-02 DIAGNOSIS — L89620 Pressure ulcer of left heel, unstageable: Secondary | ICD-10-CM | POA: Diagnosis present

## 2024-08-02 DIAGNOSIS — D849 Immunodeficiency, unspecified: Secondary | ICD-10-CM | POA: Diagnosis present

## 2024-08-02 DIAGNOSIS — F03C0 Unspecified dementia, severe, without behavioral disturbance, psychotic disturbance, mood disturbance, and anxiety: Secondary | ICD-10-CM | POA: Diagnosis present

## 2024-08-02 DIAGNOSIS — Z7901 Long term (current) use of anticoagulants: Secondary | ICD-10-CM | POA: Diagnosis not present

## 2024-08-02 DIAGNOSIS — I10 Essential (primary) hypertension: Secondary | ICD-10-CM | POA: Diagnosis present

## 2024-08-02 DIAGNOSIS — R700 Elevated erythrocyte sedimentation rate: Secondary | ICD-10-CM

## 2024-08-02 DIAGNOSIS — Z944 Liver transplant status: Secondary | ICD-10-CM

## 2024-08-02 DIAGNOSIS — E1122 Type 2 diabetes mellitus with diabetic chronic kidney disease: Secondary | ICD-10-CM | POA: Diagnosis present

## 2024-08-02 DIAGNOSIS — E46 Unspecified protein-calorie malnutrition: Secondary | ICD-10-CM | POA: Diagnosis not present

## 2024-08-02 LAB — CBC WITH DIFFERENTIAL/PLATELET
Abs Immature Granulocytes: 0.11 K/uL — ABNORMAL HIGH (ref 0.00–0.07)
Basophils Absolute: 0.1 K/uL (ref 0.0–0.1)
Basophils Relative: 1 %
Eosinophils Absolute: 0 K/uL (ref 0.0–0.5)
Eosinophils Relative: 0 %
HCT: 29.2 % — ABNORMAL LOW (ref 39.0–52.0)
Hemoglobin: 8.9 g/dL — ABNORMAL LOW (ref 13.0–17.0)
Immature Granulocytes: 1 %
Lymphocytes Relative: 7 %
Lymphs Abs: 0.9 K/uL (ref 0.7–4.0)
MCH: 24.1 pg — ABNORMAL LOW (ref 26.0–34.0)
MCHC: 30.5 g/dL (ref 30.0–36.0)
MCV: 78.9 fL — ABNORMAL LOW (ref 80.0–100.0)
Monocytes Absolute: 0.7 K/uL (ref 0.1–1.0)
Monocytes Relative: 5 %
Neutro Abs: 10.8 K/uL — ABNORMAL HIGH (ref 1.7–7.7)
Neutrophils Relative %: 86 %
Platelets: 473 K/uL — ABNORMAL HIGH (ref 150–400)
RBC: 3.7 MIL/uL — ABNORMAL LOW (ref 4.22–5.81)
RDW: 16.6 % — ABNORMAL HIGH (ref 11.5–15.5)
WBC: 12.5 K/uL — ABNORMAL HIGH (ref 4.0–10.5)
nRBC: 0 % (ref 0.0–0.2)

## 2024-08-02 LAB — COMPREHENSIVE METABOLIC PANEL WITH GFR
ALT: 17 U/L (ref 0–44)
AST: 15 U/L (ref 15–41)
Albumin: 2 g/dL — ABNORMAL LOW (ref 3.5–5.0)
Alkaline Phosphatase: 102 U/L (ref 38–126)
Anion gap: 9 (ref 5–15)
BUN: 73 mg/dL — ABNORMAL HIGH (ref 8–23)
CO2: 19 mmol/L — ABNORMAL LOW (ref 22–32)
Calcium: 9.6 mg/dL (ref 8.9–10.3)
Chloride: 112 mmol/L — ABNORMAL HIGH (ref 98–111)
Creatinine, Ser: 1.98 mg/dL — ABNORMAL HIGH (ref 0.61–1.24)
GFR, Estimated: 34 mL/min — ABNORMAL LOW (ref >60)
Glucose, Bld: 128 mg/dL — ABNORMAL HIGH (ref 70–99)
Potassium: 4.2 mmol/L (ref 3.5–5.1)
Sodium: 140 mmol/L (ref 135–145)
Total Bilirubin: 0.6 mg/dL (ref 0.0–1.2)
Total Protein: 6.7 g/dL (ref 6.5–8.1)

## 2024-08-02 LAB — TSH: TSH: 2.733 u[IU]/mL (ref 0.350–4.500)

## 2024-08-02 LAB — GLUCOSE, CAPILLARY
Glucose-Capillary: 97 mg/dL (ref 70–99)
Glucose-Capillary: 82 mg/dL (ref 70–99)

## 2024-08-02 LAB — MAGNESIUM: Magnesium: 2 mg/dL (ref 1.7–2.4)

## 2024-08-02 LAB — RESP PANEL BY RT-PCR (RSV, FLU A&B, COVID)  RVPGX2
Influenza A by PCR: NEGATIVE
Influenza B by PCR: NEGATIVE
Resp Syncytial Virus by PCR: NEGATIVE
SARS Coronavirus 2 by RT PCR: NEGATIVE

## 2024-08-02 LAB — POC OCCULT BLOOD, ED: Fecal Occult Bld: NEGATIVE

## 2024-08-02 LAB — SEDIMENTATION RATE: Sed Rate: 138 mm/h — ABNORMAL HIGH (ref 0–16)

## 2024-08-02 LAB — C-REACTIVE PROTEIN: CRP: 17 mg/dL — ABNORMAL HIGH (ref <1.0)

## 2024-08-02 LAB — LIPASE, BLOOD: Lipase: 20 U/L (ref 11–51)

## 2024-08-02 MED ORDER — TAMSULOSIN HCL 0.4 MG PO CAPS
0.4000 mg | ORAL_CAPSULE | Freq: Every day | ORAL | Status: DC
  Administered 2024-08-03 – 2024-08-06 (×4): 0.4 mg via ORAL
  Filled 2024-08-02 (×5): qty 1

## 2024-08-02 MED ORDER — INSULIN ASPART 100 UNIT/ML IJ SOLN
0.0000 [IU] | Freq: Three times a day (TID) | INTRAMUSCULAR | Status: DC
  Administered 2024-08-03: 1 [IU] via SUBCUTANEOUS
  Administered 2024-08-03: 2 [IU] via SUBCUTANEOUS
  Administered 2024-08-04 – 2024-08-06 (×5): 1 [IU] via SUBCUTANEOUS
  Administered 2024-08-06 – 2024-08-07 (×4): 2 [IU] via SUBCUTANEOUS
  Administered 2024-08-07: 1 [IU] via SUBCUTANEOUS
  Administered 2024-08-08: 2 [IU] via SUBCUTANEOUS
  Administered 2024-08-08 – 2024-08-10 (×5): 1 [IU] via SUBCUTANEOUS
  Administered 2024-08-10 – 2024-08-12 (×6): 2 [IU] via SUBCUTANEOUS
  Administered 2024-08-12: 1 [IU] via SUBCUTANEOUS
  Administered 2024-08-13: 2 [IU] via SUBCUTANEOUS
  Administered 2024-08-13: 3 [IU] via SUBCUTANEOUS
  Administered 2024-08-13: 1 [IU] via SUBCUTANEOUS
  Filled 2024-08-02: qty 2
  Filled 2024-08-02: qty 1
  Filled 2024-08-02: qty 3
  Filled 2024-08-02 (×4): qty 2
  Filled 2024-08-02: qty 3
  Filled 2024-08-02: qty 1
  Filled 2024-08-02: qty 3
  Filled 2024-08-02 (×2): qty 1
  Filled 2024-08-02: qty 3
  Filled 2024-08-02: qty 2
  Filled 2024-08-02 (×3): qty 1
  Filled 2024-08-02 (×2): qty 2
  Filled 2024-08-02 (×2): qty 1
  Filled 2024-08-02: qty 2
  Filled 2024-08-02 (×4): qty 1
  Filled 2024-08-02 (×2): qty 2

## 2024-08-02 MED ORDER — LIDOCAINE HCL URETHRAL/MUCOSAL 2 % EX GEL
1.0000 | Freq: Once | CUTANEOUS | Status: AC
  Administered 2024-08-03: 1
  Filled 2024-08-02: qty 6

## 2024-08-02 MED ORDER — LEVETIRACETAM 750 MG PO TABS
750.0000 mg | ORAL_TABLET | Freq: Two times a day (BID) | ORAL | Status: DC
  Administered 2024-08-02 – 2024-08-13 (×22): 750 mg via ORAL
  Filled 2024-08-02 (×22): qty 1

## 2024-08-02 MED ORDER — ACETAMINOPHEN 650 MG RE SUPP: 650.0000 mg | Freq: Four times a day (QID) | RECTAL | Status: DC | PRN

## 2024-08-02 MED ORDER — SODIUM CHLORIDE 0.9 % IV BOLUS
1000.0000 mL | Freq: Once | INTRAVENOUS | Status: AC
  Administered 2024-08-02: 1000 mL via INTRAVENOUS

## 2024-08-02 MED ORDER — FLORANEX PO PACK
PACK | Freq: Three times a day (TID) | ORAL | Status: AC
  Administered 2024-08-02 – 2024-08-08 (×19): 1 g via ORAL
  Administered 2024-08-09 – 2024-08-10 (×4): 1 via ORAL
  Administered 2024-08-10 – 2024-08-13 (×10): 1 g via ORAL
  Filled 2024-08-02 (×35): qty 1

## 2024-08-02 MED ORDER — SODIUM CHLORIDE 0.9 % IV SOLN
2.0000 g | INTRAVENOUS | Status: DC
  Administered 2024-08-03 – 2024-08-12 (×10): 2 g via INTRAVENOUS
  Filled 2024-08-02 (×10): qty 20

## 2024-08-02 MED ORDER — ALBUTEROL SULFATE (2.5 MG/3ML) 0.083% IN NEBU: 2.5000 mg | INHALATION_SOLUTION | Freq: Four times a day (QID) | RESPIRATORY_TRACT | Status: DC | PRN

## 2024-08-02 MED ORDER — SODIUM CHLORIDE 0.9 % IV SOLN: INTRAVENOUS | Status: DC

## 2024-08-02 MED ORDER — HYDRALAZINE HCL 20 MG/ML IJ SOLN: 10.0000 mg | Freq: Four times a day (QID) | INTRAMUSCULAR | Status: DC | PRN

## 2024-08-02 MED ORDER — LORAZEPAM 2 MG/ML IJ SOLN
1.0000 mg | Freq: Four times a day (QID) | INTRAMUSCULAR | Status: DC | PRN
  Administered 2024-08-02: 1 mg via INTRAVENOUS
  Filled 2024-08-02: qty 1

## 2024-08-02 MED ORDER — VANCOMYCIN HCL 1750 MG/350ML IV SOLN
1750.0000 mg | Freq: Once | INTRAVENOUS | Status: AC
  Administered 2024-08-02: 1750 mg via INTRAVENOUS
  Filled 2024-08-02: qty 350

## 2024-08-02 MED ORDER — ACETAMINOPHEN 325 MG PO TABS
650.0000 mg | ORAL_TABLET | Freq: Four times a day (QID) | ORAL | Status: DC | PRN
  Administered 2024-08-02 – 2024-08-03 (×2): 650 mg via ORAL
  Filled 2024-08-02 (×2): qty 2

## 2024-08-02 MED ORDER — ENOXAPARIN SODIUM 30 MG/0.3ML IJ SOSY
30.0000 mg | PREFILLED_SYRINGE | INTRAMUSCULAR | Status: DC
  Administered 2024-08-02 – 2024-08-07 (×6): 30 mg via SUBCUTANEOUS
  Filled 2024-08-02 (×6): qty 0.3

## 2024-08-02 MED ORDER — VANCOMYCIN HCL IN DEXTROSE 1-5 GM/200ML-% IV SOLN
1000.0000 mg | INTRAVENOUS | Status: DC
  Administered 2024-08-03 – 2024-08-04 (×2): 1000 mg via INTRAVENOUS
  Filled 2024-08-02 (×2): qty 200

## 2024-08-02 MED ORDER — GADOBUTROL 1 MMOL/ML IV SOLN
8.0000 mL | Freq: Once | INTRAVENOUS | Status: AC | PRN
  Administered 2024-08-02: 8 mL via INTRAVENOUS

## 2024-08-02 MED ORDER — BISACODYL 5 MG PO TBEC: 5.0000 mg | DELAYED_RELEASE_TABLET | Freq: Every day | ORAL | Status: DC | PRN

## 2024-08-02 MED ORDER — POLYETHYLENE GLYCOL 3350 17 G PO PACK: 17.0000 g | PACK | Freq: Every day | ORAL | Status: DC | PRN

## 2024-08-02 MED ORDER — TRAMADOL HCL 50 MG PO TABS: 25.0000 mg | ORAL_TABLET | Freq: Every evening | ORAL | Status: DC | PRN

## 2024-08-02 MED ORDER — SODIUM CHLORIDE 0.9 % IV BOLUS: 500.0000 mL | Freq: Once | INTRAVENOUS | Status: DC

## 2024-08-02 MED ORDER — SODIUM CHLORIDE 0.9 % IV SOLN
2.0000 g | Freq: Once | INTRAVENOUS | Status: AC
  Administered 2024-08-02: 2 g via INTRAVENOUS
  Filled 2024-08-02: qty 20

## 2024-08-02 MED ORDER — INSULIN ASPART 100 UNIT/ML IJ SOLN
0.0000 [IU] | Freq: Every day | INTRAMUSCULAR | Status: DC
  Administered 2024-08-12: 2 [IU] via SUBCUTANEOUS
  Filled 2024-08-02 (×2): qty 2

## 2024-08-02 MED ORDER — FINASTERIDE 5 MG PO TABS
5.0000 mg | ORAL_TABLET | Freq: Every day | ORAL | Status: DC
  Administered 2024-08-02 – 2024-08-12 (×11): 5 mg via ORAL
  Filled 2024-08-02 (×11): qty 1

## 2024-08-02 NOTE — H&P (Signed)
 [facility] History and Physical  Gilbert Wilson FMW:[id number] DOB: 08/11/1946 DOA: 08/02/2024 PCP: [name]  Presented from: Home Chief Complaint: Lethargy  History of Present Illness: Gilbert Wilson is a 78 y.o. male with PMH significant for severe dementia, h/o stroke with right hemiparesis bedbound status for last 3 years, total care dependent, h/o renal and liver transplant, seizure disorder, bladder outlet obstruction requiring in and out cath 4 times a day, frequent UTI, DVT and A-fib no longer on AC, osteoarthritis, sacral decubitus and left ankle pressure skin injury. Was hospitalized in April 2025 for 2 weeks for CHF exacerbation and again in May for flareup of left knee osteoarthritis.  Patient is taking care at home by his wife and daughter. For the last 6 months, he has been progressing of sacral ulcer and left ankle pressure ulcer. Patient is total care dependent.  Only able to speak few phrases to communicate. Without behavioral symptoms Family believes that he has been progressively declining physically and mentally.  Daughter believes that he is also in pain but cannot express.  In the last few months, required couple of courses of antibiotics for UTI.  After every course of antibiotic, patient gets diarrhea which contaminates the pre-existing wound. Family states that for the last few days, he has been more lethargic than usual and hence brought to the ED today.  In the ED, patient was afebrile, heart rate in 120s, breathing on room air. Labs with WC count 12.5, hemoglobin 8.9, BUN/creatinine 73/1.98, CRP elevated to 70. Respiratory virus panel negative, fecal occult blood test negative Chest x-ray did not show any acute cardiopulmonary disease Left foot x-ray showed soft tissue wound over the inferior plantar soft tissues adjacent the posterior calcaneus. No significant bone destruction to suggest osteomyelitis. FOBT negative On exam, patient had  a big deep sacral ulcer EDP ordered for MRI sacrum to rule out osteomyelitis. Blood culture was collected.  Patient was started on broad spectrum IV abx. Hospitalist service was consulted for further management.  At the time of my evaluation, patient was alert, awake, able to confirm me his name.  Unable to answer other questions.  Not restless or agitated.  Wife and daughter at bedside. History reviewed and detailed as above.    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 His smoking use included cigarettes. He has never used smokeless tobacco. He reports that he does not drink alcohol and does not use drugs.  Allergies:  Allergies  Allergen Reactions   Linezolid Other (See Comments)    Seizures   Levaquin [Levofloxacin] Itching   Lisinopril Cough   Linezolid, Levaquin [levofloxacin], and Lisinopril   Family history:  Family History  Problem Relation Age of Onset   Hypertension Mother    Diabetes Mother    Diabetes Father      Physical Exam: Vitals:   08/02/24 1314 08/02/24 1430 08/02/24 1505 08/02/24 1516  BP:  (!) 133/55  (!) 134/57  Pulse:  77 77 77  Resp:    18  Temp: 98.6 F (37 C)   98.5 F (36.9 C)  TempSrc: Rectal   Axillary  SpO2:  100% 100% 100%   Wt Readings from Last 3  Encounters:  05/12/24 89.8 kg  01/04/24 88.9 kg  12/03/23 88.5 kg   There is no height or weight on file to calculate BMI.  General exam: Pleasant, elderly African-American male.  Not in distress Skin: No rashes, lesions or ulcers. HEENT: Atraumatic, normocephalic, no obvious bleeding Lungs: Clear to auscultation bilaterally,  CVS: S1, S2, no murmur,   GI/Abd: Soft, nontender, nondistended, bowel sound present,   CNS: Alert, awake, able to confirm his name.  Not able to have another conversation Psychiatry: Sad affect Extremities: No pedal edema, no calf tenderness, left heel pressure ulcer as in picture   ----------------------------------------------------------------------------------------------------------------------------------------- ----------------------------------------------------------------------------------------------------------------------------------------- -----------------------------------------------------------------------------------------------------------------------------------------  Assessment/Plan: Leukocytosis Presented with lethargy.   No fever, WBC count elevated. Suspect sacral decubitus ulcer as the potential source of infection. Blood culture sent Started on broad-spectrum IV antibiotics Pending MRI sacrum Recent Labs  Lab 08/02/24 1228  WBC 12.5*   Left heel decubitus ulcer Picture attached.  X-ray without evidence of osteomyelitis  Diarrhea Family reports diarrhea after every course of antibiotics.  Last bowel movement yesterday and was watery.  Continue to monitor.  Avoid any stool softeners for now. Add probiotics  Severe dementia  h/o stroke with right hemiparesis  bedbound status for last 3 years total care dependent It is impressive how his wife and daughter have been taking care of him at home. I am afraid that his sacroiliac result is not going to heal given his poor intake, poor hydration and immobility.  Most likely will  continue to decline physically and mentally. I had an honest conversation with wife and daughter at bedside.  They understand and agree with the assessment. They are receptive to palliative care conversation at this time unlike in the last admission. For now, wife would like to postpone conversation about CODE STATUS until palliative care conversation Continue Remeron  7.5 mg nightly  Chronic bladder outlet obstruction BPH Was getting In-N-Out catheterization 4 times a day at home. Family wants a Foley catheter.  I think it would be more comfortable for him than multiple In-N-Out catheterization. Given previous team BPH, coud catheter has been ordered Continue Flomax  and finasteride   Seizure disorder Continue Keppra   Chronic anemia Hemoglobin at baseline between 8 and 9 Recent Labs    09/29/23 0632 09/30/23 0734 01/04/24 1758 01/05/24 0557 01/06/24 0500 05/12/24 2125 08/02/24 1228  HGB 8.7*   < > 8.8* 8.4* 8.1* 10.4* 8.9*  MCV 75.8*   < > 81.9 80.6 79.2* 80.1 78.9*  VITAMINB12 380  --   --   --   --   --   --   FOLATE 13.8  --   --   --   --   --   --   TIBC 176*  --   --   --   --   --   --   IRON 7*  --   --   --   --   --   --    < > =  values in this interval not displayed.   Hypertension PTA meds-amlodipine  5 mg daily, Lasix  20 mg daily as needed Continue to monitor without meds  Type 2 diabetes mellitus A1c 6.4 on September 28, 2023 PTA meds-Lantus  Started SSI/Accu-Cheks No results for input(s): GLUCAP in the last 168 hours.   Mobility: Bedbound for the last 3 years  Goals of care:   Code Status: Full Code    DVT prophylaxis:  enoxaparin  (LOVENOX ) injection 30 mg Start: 08/02/24 1700   Antimicrobials: IV Rocephin  and vancomycin  Fluid: Start NS at 75 mL/h Consultants: None Family Communication: Family at bedside  Status: Inpatient Level of care:  Telemetry   Patient is from: Home Anticipated d/c to: Home ultimately 1 to 2 days  Diet:  Diet Order              DIET DYS 2 Room service appropriate? Yes; Fluid consistency: Thin  Diet effective now                    ------------------------------------------------------------------------------------- Severity of Illness: The appropriate patient status for this patient is INPATIENT. Inpatient status is judged to be reasonable and necessary in order to provide the required intensity of service to ensure the patient's safety. The patient's presenting symptoms, physical exam findings, and initial radiographic and laboratory data in the context of their chronic comorbidities is felt to place them at high risk for further clinical deterioration. Furthermore, it is not anticipated that the patient will be medically stable for discharge from the hospital within 2 midnights of admission.   * I certify that at the point of admission it is my clinical judgment that the patient will require inpatient hospital care spanning beyond 2 midnights from the point of admission due to high intensity of service, high risk for further deterioration and high frequency of surveillance required.* -------------------------------------------------------------------------------------   Home Meds: Prior to Admission medications   Medication Sig Start Date End Date Taking? Authorizing Provider  acetaminophen  (TYLENOL ) 325 MG tablet Take 2 tablets (650 mg total) by mouth every 8 (eight) hours. 01/06/24   Gonfa, Taye T, MD  albuterol  (VENTOLIN  HFA) 108 (90 Base) MCG/ACT inhaler Inhale 2 puffs into the lungs every 6 (six) hours as needed for wheezing or shortness of breath.    [provider]  amLODipine  (NORVASC ) 5 MG tablet Take 1 tablet (5 mg total) by mouth daily. 12/03/23   Arlice Reichert, MD  apixaban  (ELIQUIS ) 5 MG TABS tablet Take 1 tablet (5 mg total) by mouth 2 (two) times daily. 01/06/24   Gonfa, Taye T, MD  ascorbic acid (VITAMIN C) 250 MG tablet Take 250 mg by mouth at bedtime. Take 1 tablet by mouth with  the 325mg  iron tablet.    [provider]  cycloSPORINE  (NEORAL ) 100 MG/ML microemulsion solution Take 130 mg by mouth 2 (two) times daily. 1.67ml 12/28/23   [provider]  epoetin alfa (EPOGEN) 10000 UNIT/ML injection Inject 10,000 Units into the skin once. Give every 10 days    [provider]  ferrous sulfate  325 (65 FE) MG EC tablet Take 325 mg by mouth at bedtime. Take 1 tablet by mouth with the 250mg  vitamin c tablet.    [provider]  finasteride  (PROSCAR ) 5 MG tablet Take 5 mg by mouth at bedtime.    [provider]  furosemide  (LASIX ) 20 MG tablet Take 1 tablet (20 mg total) by mouth daily as needed for edema. 12/03/23   Arlice Reichert, MD  insulin   glargine (LANTUS  SOLOSTAR) 100 UNIT/ML Solostar Pen Inject 0-6 Units into the skin at bedtime. If BS>130-Take 6 units, Increase 1 unit for every 10 mg/dl above 869.    [provider]  levETIRAcetam  (KEPPRA ) 750 MG tablet Take 1 tablet (750 mg total) by mouth 2 (two) times daily. 04/29/23   Garrick Charleston, MD  mirtazapine  (REMERON ) 15 MG tablet Take 0.5 tablets (7.5 mg total) by mouth at bedtime. Patient taking differently: Take 15 mg by mouth at bedtime. 01/04/22   Tobie Yetta HERO, MD  mycophenolate  (MYFORTIC ) 180 MG EC tablet Take 180 mg by mouth 2 (two) times daily.    [provider]  Nutritional Supplements (ENSURE HIGH PROTEIN) LIQD Take 237 mLs by mouth 2 (two) times daily. 10/12/23   [provider]  omeprazole (PRILOSEC) 20 MG capsule Take 20 mg by mouth at bedtime.    [provider]  senna-docusate (SENOKOT-S) 8.6-50 MG tablet Take 1-2 tablets by mouth 2 (two) times daily between meals as needed for mild constipation or moderate constipation. 01/06/24   Gonfa, Taye T, MD  sodium zirconium cyclosilicate  (LOKELMA ) 10 g PACK packet Take 10 g by mouth daily. 12/03/23   Arlice Reichert, MD  tamsulosin  (FLOMAX ) 0.4 MG CAPS capsule Take 0.4 mg by mouth daily. Take 1 tablet  by mouth with Vit D3 2000iu    [provider]  traMADol  (ULTRAM ) 50 MG tablet Take 25-50 mg by mouth at bedtime as needed for moderate pain (pain score 4-6). 12/22/23   [provider]    Labs on Admission:   CBC: Recent Labs  Lab 08/02/24 1228  WBC 12.5*  NEUTROABS 10.8*  HGB 8.9*  HCT 29.2*  MCV 78.9*  PLT 473*    Basic Metabolic Panel: Recent Labs  Lab 08/02/24 1228  NA 140  K 4.2  CL 112*  CO2 19*  GLUCOSE 128*  BUN 73*  CREATININE 1.98*  CALCIUM  9.6  MG 2.0    Liver Function Tests: Recent Labs  Lab 08/02/24 1228  AST 15  ALT 17  ALKPHOS 102  BILITOT 0.6  PROT 6.7  ALBUMIN 2.0*   Recent Labs  Lab 08/02/24 1228  LIPASE 20   No results for input(s): AMMONIA in the last 168 hours.  Cardiac Enzymes: No results for input(s): CKTOTAL, CKMB, CKMBINDEX, TROPONINI in the last 168 hours.  BNP (last 3 results) Recent Labs    11/20/23 2021  BNP 213.9*    ProBNP (last 3 results) No results for input(s): PROBNP in the last 8760 hours.  CBG: No results for input(s): GLUCAP in the last 168 hours.  Lipase     Component Value Date/Time   LIPASE 20 08/02/2024 1228     Urinalysis    Component Value Date/Time   COLORURINE YELLOW 05/12/2024 2254   APPEARANCEUR HAZY (A) 05/12/2024 2254   LABSPEC 1.012 05/12/2024 2254   PHURINE 6.0 05/12/2024 2254   GLUCOSEU NEGATIVE 05/12/2024 2254   HGBUR SMALL (A) 05/12/2024 2254   BILIRUBINUR NEGATIVE 05/12/2024 2254   KETONESUR NEGATIVE 05/12/2024 2254   PROTEINUR 100 (A) 05/12/2024 2254   NITRITE NEGATIVE 05/12/2024 2254   LEUKOCYTESUR MODERATE (A) 05/12/2024 2254     Drugs of Abuse  No results found for: LABOPIA, COCAINSCRNUR, LABBENZ, AMPHETMU, THCU, LABBARB    Radiological Exams on Admission: DG Foot Complete Left Result Date: 08/02/2024 CLINICAL DATA:  Evaluate for osteomyelitis. EXAM: DG FOOT COMPLETE 3+V*L* COMPARISON:  None Available. FINDINGS: Diffuse  decreased bone mineralization. Moderate degenerative change  of the first MTP joint and mild degenerative change at the first IP joint. No acute fracture or dislocation. Degenerative changes over the midfoot and hindfoot. Soft tissue wound over the superficial aspect of the inferior plantar soft tissues adjacent the posterior calcaneus. No significant bone destruction to suggest osteomyelitis. Small vessel atherosclerotic disease is present. IMPRESSION: 1. Soft tissue wound over the inferior plantar soft tissues adjacent the posterior calcaneus. No significant bone destruction to suggest osteomyelitis. 2. Degenerative changes as described. Electronically Signed   By: Toribio Agreste M.D.   On: 08/02/2024 13:22   DG Chest Portable 1 View Result Date: 08/02/2024 CLINICAL DATA:  Dementia and diabetes.  Potential UTI. EXAM: PORTABLE CHEST 1 VIEW COMPARISON:  01/04/2024 FINDINGS: Lungs are hypoinflated without lobar consolidation, effusion or pneumothorax. Minimal stable prominence of the central pulmonary bronchovascular markings likely due to the degree of hypoinflation. Cardiomediastinal silhouette and remainder of the exam is unchanged. IMPRESSION: Hypoinflation without acute cardiopulmonary disease. Electronically Signed   By: Toribio Agreste M.D.   On: 08/02/2024 13:10     Signed, Chapman Rota, MD [facility] 08/02/2024

## 2024-08-02 NOTE — Progress Notes (Signed)
 Pharmacy Antibiotic Note  Gilbert Wilson is a 78 y.o. male admitted on 08/02/2024 with progressing sacral ulcer with concern for infection.  Pharmacy has been consulted for Vancomycin  dosing.  Given vancomycin  1750 mg IV x 1 in ED  Plan: Vancomycin  1g IV q 24h (eAUC 463) Monitor renal function, Cx and clinical progression to narrow Vancomycin  levels as indicated     Temp (24hrs), Avg:98.6 F (37 C), Min:98.5 F (36.9 C), Max:98.8 F (37.1 C)  Recent Labs  Lab 08/02/24 1228  WBC 12.5*  CREATININE 1.98*    CrCl cannot be calculated (Unknown ideal weight.).    Allergies  Allergen Reactions   Linezolid Other (See Comments)    Seizures   Levaquin [Levofloxacin] Itching   Lisinopril Cough    Dorn Poot, PharmD, [facility] Clinical Pharmacist ED Pharmacist Phone # [phone] 08/02/2024 4:21 PM

## 2024-08-02 NOTE — Progress Notes (Signed)
 Patient arrived to 6N32 from the ED, Alert to self, family present at bedside. Wound care to sacrum and LLE completed, tolerated well. Coude Catheter ordered, awaiting delivery. All needs met at this time. Bed in lowest position and call light within reach.

## 2024-08-02 NOTE — ED Notes (Addendum)
 In & Out cath attempted, no urine output at this time. White sediment noted in tubing when removing catheter.

## 2024-08-02 NOTE — ED Provider Notes (Signed)
 [place] EMERGENCY DEPARTMENT AT [facility] Provider Note   CSN: [id number] Arrival date & time: 08/02/24  1104     Patient presents with: Fatigue   TAIYO Wilson is a 78 y.o. male.   HPI   Presents because of increasing altered mental status.  Usually according to family, he is usually able to take p.o.  He recently, no p.o. intake.  They had to find him in order to get him anything to eat or drink.  This been present over the past couple of days.  They are concerned about possible infection.  Was given Bactrim for presumed UTI about 2 weeks ago.  Subsequently started with diarrhea following this.  He has a history recurrent UTIs.  Also concerns about worsening sacral wounds.  Concerns about worsening left pressure ulcer of the left heel as well.  They have wound care come by the house 2 times weekly.  Follows up with wound clinic towards the end of December.  December 23.  No obvious fever at home.  They feel like he is in increasing pain because of sacral wound.  Bound.  Dementia.  Usually able to tell his name.  Questionable dark stool as well but he is on iron tablets of the not sure if he is having GI loss.   Previous medical history reviewed : Patient last admitted in May 2025.  Was seen and admitted in the setting of left knee effusion.  Due to arthritis.  Status post arthrocentesis.  Negative for crystals.  No concerns of any kind of septic joint.  AKI on CKD.  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   Prior to Admission medications   Medication Sig Start Date End Date Taking? Authorizing Provider  acetaminophen  (TYLENOL ) 325 MG tablet Take 2 tablets (650 mg total) by mouth every 8 (eight) hours. 01/06/24   Gonfa, Taye T, MD  albuterol  (VENTOLIN  HFA) 108 (90 Base) MCG/ACT inhaler Inhale 2 puffs into the lungs every 6 (six) hours as needed for wheezing or shortness of breath.    [provider]  amLODipine  (NORVASC ) 5 MG  tablet Take 1 tablet (5 mg total) by mouth daily. 12/03/23   Arlice Reichert, MD  apixaban  (ELIQUIS ) 5 MG TABS tablet Take 1 tablet (5 mg total) by mouth 2 (two) times daily. 01/06/24   Gonfa, Taye T, MD  ascorbic acid (VITAMIN C) 250 MG tablet Take 250 mg by mouth at bedtime. Take 1 tablet by mouth with the 325mg  iron tablet.    [provider]  cycloSPORINE  (NEORAL ) 100 MG/ML microemulsion solution Take 130 mg by mouth 2 (two) times daily. 1.20ml 12/28/23   [provider]  epoetin alfa (EPOGEN) 10000 UNIT/ML injection Inject 10,000 Units into the skin once. Give every 10 days    [provider]  ferrous sulfate  325 (65 FE) MG EC tablet Take 325 mg by mouth at bedtime. Take 1 tablet by mouth with the 250mg  vitamin c tablet.    [provider]  finasteride  (PROSCAR ) 5 MG tablet Take 5 mg by mouth at bedtime.    [provider]  furosemide  (LASIX ) 20 MG tablet Take 1 tablet (20 mg total) by mouth daily as needed for edema. 12/03/23   Arlice Reichert, MD  insulin  glargine (LANTUS  SOLOSTAR) 100 UNIT/ML Solostar Pen Inject 0-6 Units into the skin at bedtime. If BS>130-Take 6 units, Increase 1 unit for every 10 mg/dl above 869.    [provider]  levETIRAcetam  (KEPPRA ) 750 MG tablet Take 1 tablet (750 mg total) by mouth 2 (two) times daily. 04/29/23   Garrick Charleston, MD  mirtazapine  (REMERON ) 15 MG tablet Take 0.5 tablets (7.5 mg total) by mouth at bedtime. Patient taking differently: Take 15 mg by mouth at bedtime. 01/04/22   Tobie Yetta HERO, MD  mycophenolate  (MYFORTIC ) 180 MG EC tablet Take 180 mg by mouth 2 (two) times daily.    [provider]  Nutritional Supplements (ENSURE HIGH PROTEIN) LIQD Take 237 mLs by mouth 2 (two) times daily. 10/12/23   [provider]  omeprazole (PRILOSEC) 20 MG capsule Take 20 mg by mouth at bedtime.    [provider]  senna-docusate (SENOKOT-S) 8.6-50 MG tablet Take 1-2 tablets by mouth 2 (two) times  daily between meals as needed for mild constipation or moderate constipation. 01/06/24   Gonfa, Taye T, MD  sodium zirconium cyclosilicate  (LOKELMA ) 10 g PACK packet Take 10 g by mouth daily. 12/03/23   Arlice Reichert, MD  tamsulosin  (FLOMAX ) 0.4 MG CAPS capsule Take 0.4 mg by mouth daily. Take 1 tablet by mouth with Vit D3 2000iu    [provider]  traMADol  (ULTRAM ) 50 MG tablet Take 25-50 mg by mouth at bedtime as needed for moderate pain (pain score 4-6). 12/22/23   [provider]    Allergies: Linezolid, Levaquin [levofloxacin], and Lisinopril    Review of Systems  Constitutional:  Negative for chills and fever.  HENT:  Negative for ear pain and sore throat.   Eyes:  Negative for pain and visual disturbance.  Respiratory:  Negative for cough and shortness of breath.   Cardiovascular:  Negative for chest pain and palpitations.  Gastrointestinal:  Negative for abdominal pain and vomiting.  Genitourinary:  Negative for dysuria and hematuria.  Musculoskeletal:  Negative for arthralgias and back pain.  Skin:  Negative for color change and rash.  Neurological:  Negative for seizures and syncope.  All other systems reviewed and are negative.   Updated Vital Signs BP (!) 126/54   Pulse 73   Temp 98.5 F (36.9 C) (Axillary)   Resp 18   SpO2 100%   Physical Exam Vitals and nursing note reviewed.  Constitutional:      General: He is not in acute distress.    Appearance: He is well-developed.  HENT:     Head: Normocephalic and atraumatic.  Eyes:     Conjunctiva/sclera: Conjunctivae normal.  Cardiovascular:     Rate and Rhythm: Normal rate and regular rhythm.     Heart sounds: No murmur heard. Pulmonary:     Effort: Pulmonary effort is normal. No respiratory distress.     Breath sounds: Normal breath sounds.  Abdominal:     Palpations: Abdomen is soft.     Tenderness: There is no abdominal tenderness.  Musculoskeletal:        General: No swelling.     Cervical  back: Neck supple.  Skin:    General: Skin is warm and dry.     Capillary Refill: Capillary refill takes less than 2 seconds.  Neurological:     Mental Status: He is alert.  Psychiatric:        Mood and Affect: Mood normal.       Media Information  Document Information  Photos    08/02/2024 12:11  Attached To:  Hospital Encounter on 08/02/24  Source Information  Simon Lavonia SAILOR, MD  [facility]     Media Information  Document Information  Photos    08/02/2024 12:11  Attached To:  Hospital Encounter on 08/02/24  Source Information  Simon Lavonia SAILOR, MD  [facility]      Media Information  Document Information  Photos    08/02/2024 12:16  Attached To:  Hospital Encounter on 08/02/24  Source Information  Simon Lavonia SAILOR, MD  [facility]      (all labs ordered are listed, but only abnormal results are displayed) Labs Reviewed  CBC WITH DIFFERENTIAL/PLATELET - Abnormal; Notable for the following components:      Result Value   WBC 12.5 (*)    RBC 3.70 (*)    Hemoglobin 8.9 (*)    HCT 29.2 (*)    MCV 78.9 (*)    MCH 24.1 (*)    RDW 16.6 (*)    Platelets 473 (*)    Neutro Abs 10.8 (*)    Abs Immature Granulocytes 0.11 (*)    All other components within normal limits  COMPREHENSIVE METABOLIC PANEL WITH GFR - Abnormal; Notable for the following components:   Chloride 112 (*)    CO2 19 (*)    Glucose, Bld 128 (*)    BUN 73 (*)    Creatinine, Ser 1.98 (*)    Albumin 2.0 (*)    GFR, Estimated 34 (*)    All other components within normal limits  SEDIMENTATION RATE - Abnormal; Notable for the following components:   Sed Rate 138 (*)    All other components within normal limits  C-REACTIVE PROTEIN - Abnormal; Notable for the following components:   CRP 17.0 (*)    All other components within normal limits  RESP PANEL BY RT-PCR (RSV, FLU A&B, COVID)  RVPGX2  CULTURE, BLOOD (ROUTINE X 2)  CULTURE, BLOOD (ROUTINE X 2)  LIPASE,  BLOOD  TSH  MAGNESIUM   URINALYSIS, ROUTINE W REFLEX MICROSCOPIC  HEMOGLOBIN A1C  POC OCCULT BLOOD, ED    EKG: None  Radiology: DG Foot Complete Left Result Date: 08/02/2024 CLINICAL DATA:  Evaluate for osteomyelitis. EXAM: DG FOOT COMPLETE 3+V*L* COMPARISON:  None Available. FINDINGS: Diffuse decreased bone mineralization. Moderate degenerative change of the first MTP joint and mild degenerative change at the first IP joint. No acute fracture or dislocation. Degenerative changes over the midfoot and hindfoot. Soft tissue wound over the superficial aspect of the inferior plantar soft tissues adjacent the posterior calcaneus. No significant bone destruction to suggest osteomyelitis. Small vessel atherosclerotic disease is present. IMPRESSION: 1. Soft tissue wound over the inferior plantar soft tissues adjacent the posterior calcaneus. No significant bone destruction to suggest osteomyelitis. 2. Degenerative changes as described. Electronically Signed   By: Toribio Agreste M.D.   On: 08/02/2024 13:22   DG Chest Portable 1 View Result Date: 08/02/2024 CLINICAL DATA:  Dementia and diabetes.  Potential UTI. EXAM: PORTABLE CHEST 1 VIEW COMPARISON:  01/04/2024 FINDINGS: Lungs are hypoinflated without lobar consolidation, effusion or pneumothorax. Minimal stable prominence of the central pulmonary bronchovascular markings likely due to the degree of hypoinflation. Cardiomediastinal silhouette and remainder of the exam is unchanged. IMPRESSION: Hypoinflation without acute cardiopulmonary disease. Electronically Signed   By: Toribio Agreste M.D.   On: 08/02/2024 13:10     Procedures   Medications Ordered in the ED  sodium chloride  0.9 % bolus 500 mL (has no administration in time range)  cefTRIAXone  (ROCEPHIN ) 2 g in sodium chloride  0.9 % 100 mL IVPB (2 g Intravenous New Bag/Given 08/02/24 1619)  vancomycin  (VANCOREADY) IVPB 1750 mg/350 mL (  has no administration in time range)  tamsulosin  (FLOMAX ) capsule  0.4 mg (has no administration in time range)  acetaminophen  (TYLENOL ) tablet 650 mg (650 mg Oral Given 08/02/24 1615)    Or  acetaminophen  (TYLENOL ) suppository 650 mg ( Rectal See Alternative 08/02/24 1615)  polyethylene glycol (MIRALAX  / GLYCOLAX ) packet 17 g (has no administration in time range)  bisacodyl  (DULCOLAX) EC tablet 5 mg (has no administration in time range)  albuterol  (PROVENTIL ) (2.5 MG/3ML) 0.083% nebulizer solution 2.5 mg (has no administration in time range)  hydrALAZINE  (APRESOLINE ) injection 10 mg (has no administration in time range)  LORazepam  (ATIVAN ) injection 1 mg (1 mg Intravenous Given 08/02/24 1611)  traMADol  (ULTRAM ) tablet 25-50 mg (has no administration in time range)  finasteride  (PROSCAR ) tablet 5 mg (has no administration in time range)  levETIRAcetam  (KEPPRA ) tablet 750 mg (has no administration in time range)  insulin  aspart (novoLOG ) injection 0-9 Units (has no administration in time range)  insulin  aspart (novoLOG ) injection 0-5 Units (has no administration in time range)  enoxaparin  (LOVENOX ) injection 30 mg (has no administration in time range)  0.9 %  sodium chloride  infusion (has no administration in time range)  lactobacillus acidophilus (BACID) tablet 2 tablet (has no administration in time range)  vancomycin  (VANCOCIN ) IVPB 1000 mg/200 mL premix (has no administration in time range)  cefTRIAXone  (ROCEPHIN ) 2 g in sodium chloride  0.9 % 100 mL IVPB (has no administration in time range)  sodium chloride  0.9 % bolus 1,000 mL (1,000 mLs Intravenous New Bag/Given 08/02/24 1512)                                    Medical Decision Making Amount and/or Complexity of Data Reviewed Labs: ordered. Radiology: ordered.  Risk Prescription drug management. Decision regarding hospitalization.    HPI:   Presents because of increasing altered mental status.  Usually according to family, he is usually able to take p.o.  He recently, no p.o. intake.  They  had to find him in order to get him anything to eat or drink.  This been present over the past couple of days.  They are concerned about possible infection.  Was given Bactrim for presumed UTI about 2 weeks ago.  Subsequently started with diarrhea following this.  He has a history recurrent UTIs.  Also concerns about worsening sacral wounds.  Concerns about worsening left pressure ulcer of the left heel as well.  They have wound care come by the house 2 times weekly.  Follows up with wound clinic towards the end of December.  December 23.  No obvious fever at home.  They feel like he is in increasing pain because of sacral wound.  Bound.  Dementia.  Usually able to tell his name.  Questionable dark stool as well but he is on iron tablets of the not sure if he is having GI loss.   Previous medical history reviewed : Patient last admitted in May 2025.  Was seen and admitted in the setting of left knee effusion.  Due to arthritis.  Status post arthrocentesis.  Negative for crystals.  No concerns of any kind of septic joint.  AKI on CKD.  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  MDM:   Upon exam, patient able to tell me his name.  Not able to tell me date or location.  This is around baseline for him.  In all extremities.  No signs of trauma to the head.  No concerns for spontaneous subdural epidural or any kind of intraparenchymal bleed.   Soft and benign abdomen at this time.  No rebound guarding or tenderness.  Large sacral wound.  Large wound of the left calcaneus.  Further pictures.  Reevaluation:   Upon reexamination, patient hemodynamically stable.    Patient leukocytosis.  Increased neutrophil count.  Slight acidosis.  Elevated ESR and CRP.  All concerning for acute infection.  I think the most likely source is osteo in the setting of this sacral wound.  Therefore, did obtain MRI with and without contrast of the sacrum.  Start patient on broad-spectrum  antibiotics including ceftriaxone  and vancomycin .  Blood cultures obtained.  The left heel does not look like osteo at this time.  Looks like a superficial wound.  Still pending UA.  Not really producing much urine.  Did try In-N-Out Foley cath but no urine at this time.  Creatinine is slightly elevated.  Elevated BUN as well.  Likely in the setting of prerenal AKI from lack of p.o. intake.  Gave patient a liter of fluid.  Will continue to monitor.   Patient admitted for further care.   Interventions: vanc, cefepime , bolus   EKG Interpreted by Me: sinus    Cardiac Tele Interpreted by Me: sinus    I have independently interpreted the XR  images and agree with the radiologist finding   Social Determinant of Health: chronic dementia    Disposition and Follow Up: admit       Final diagnoses:  AKI (acute kidney injury)  Disorientation  ESR raised    ED Discharge Orders     None          Simon Lavonia SAILOR, MD 08/02/24 [phone]

## 2024-08-02 NOTE — ED Notes (Signed)
 Patient transported to MRI

## 2024-08-02 NOTE — Progress Notes (Signed)
 ED Pharmacy Antibiotic Sign Off An antibiotic consult was received from an ED provider for vancomycin  per pharmacy dosing for concern for osteo. A chart review was completed to assess appropriateness.   The following one time order(s) were placed:  Vancomycin  1750 mg IV x 1  Further antibiotic and/or antibiotic pharmacy consults should be ordered by the admitting provider if indicated.   Thank you for allowing pharmacy to be a part of this patient's care.   Dorn Poot, [facility]  Clinical Pharmacist 08/02/24 2:40 PM

## 2024-08-02 NOTE — ED Triage Notes (Signed)
 Patient BIB [facility] for Lethargic, warm to the touch. Reports Left heel (5-6 months) & sacral wound. Potential UTI Hx of Dementia & Diabetes Axillary 99 175 CBG

## 2024-08-03 ENCOUNTER — Other Ambulatory Visit: Payer: Self-pay

## 2024-08-03 DIAGNOSIS — Z7189 Other specified counseling: Secondary | ICD-10-CM

## 2024-08-03 DIAGNOSIS — N1832 Chronic kidney disease, stage 3b: Secondary | ICD-10-CM

## 2024-08-03 DIAGNOSIS — I6930 Unspecified sequelae of cerebral infarction: Secondary | ICD-10-CM

## 2024-08-03 DIAGNOSIS — Z515 Encounter for palliative care: Secondary | ICD-10-CM

## 2024-08-03 DIAGNOSIS — K611 Rectal abscess: Secondary | ICD-10-CM

## 2024-08-03 DIAGNOSIS — L97429 Non-pressure chronic ulcer of left heel and midfoot with unspecified severity: Secondary | ICD-10-CM

## 2024-08-03 LAB — HEMOGLOBIN A1C
Hgb A1c MFr Bld: 6 % — ABNORMAL HIGH (ref 4.8–5.6)
Mean Plasma Glucose: 126 mg/dL

## 2024-08-03 LAB — CBC
HCT: 22.4 % — ABNORMAL LOW (ref 39.0–52.0)
Hemoglobin: 7 g/dL — ABNORMAL LOW (ref 13.0–17.0)
MCH: 24.5 pg — ABNORMAL LOW (ref 26.0–34.0)
MCHC: 31.3 g/dL (ref 30.0–36.0)
MCV: 78.3 fL — ABNORMAL LOW (ref 80.0–100.0)
Platelets: 394 K/uL (ref 150–400)
RBC: 2.86 MIL/uL — ABNORMAL LOW (ref 4.22–5.81)
RDW: 16.6 % — ABNORMAL HIGH (ref 11.5–15.5)
WBC: 10.2 K/uL (ref 4.0–10.5)
nRBC: 0 % (ref 0.0–0.2)

## 2024-08-03 LAB — GLUCOSE, CAPILLARY
Glucose-Capillary: 58 mg/dL — ABNORMAL LOW (ref 70–99)
Glucose-Capillary: 87 mg/dL (ref 70–99)
Glucose-Capillary: 141 mg/dL — ABNORMAL HIGH (ref 70–99)
Glucose-Capillary: 174 mg/dL — ABNORMAL HIGH (ref 70–99)
Glucose-Capillary: 140 mg/dL — ABNORMAL HIGH (ref 70–99)

## 2024-08-03 LAB — BASIC METABOLIC PANEL WITH GFR
Anion gap: 10 (ref 5–15)
BUN: 63 mg/dL — ABNORMAL HIGH (ref 8–23)
CO2: 15 mmol/L — ABNORMAL LOW (ref 22–32)
Calcium: 8.9 mg/dL (ref 8.9–10.3)
Chloride: 119 mmol/L — ABNORMAL HIGH (ref 98–111)
Creatinine, Ser: 1.79 mg/dL — ABNORMAL HIGH (ref 0.61–1.24)
GFR, Estimated: 39 mL/min — ABNORMAL LOW (ref >60)
Glucose, Bld: 62 mg/dL — ABNORMAL LOW (ref 70–99)
Potassium: 4.2 mmol/L (ref 3.5–5.1)
Sodium: 144 mmol/L (ref 135–145)

## 2024-08-03 MED ORDER — MYCOPHENOLATE SODIUM 180 MG PO TBEC
180.0000 mg | DELAYED_RELEASE_TABLET | Freq: Two times a day (BID) | ORAL | Status: DC
  Administered 2024-08-03 – 2024-08-13 (×21): 180 mg via ORAL
  Filled 2024-08-03 (×23): qty 1

## 2024-08-03 MED ORDER — CYCLOSPORINE MODIFIED (GENGRAF) 100 MG/ML PO SOLN
130.0000 mg | Freq: Two times a day (BID) | ORAL | Status: DC
  Administered 2024-08-03 – 2024-08-13 (×21): 130 mg via ORAL
  Filled 2024-08-03 (×22): qty 1.3

## 2024-08-03 MED ORDER — CYCLOSPORINE MODIFIED (NEORAL) 100 MG/ML PO SOLN: 130.0000 mg | Freq: Two times a day (BID) | ORAL | Status: DC

## 2024-08-03 MED ORDER — CHLORHEXIDINE GLUCONATE CLOTH 2 % EX PADS
6.0000 | MEDICATED_PAD | Freq: Every day | CUTANEOUS | Status: DC
  Administered 2024-08-03 – 2024-08-13 (×11): 6 via TOPICAL

## 2024-08-03 NOTE — Plan of Care (Signed)
  Problem: Pain Managment: Goal: General experience of comfort will improve and/or be controlled Outcome: Progressing   Problem: Safety: Goal: Ability to remain free from injury will improve Outcome: Progressing

## 2024-08-03 NOTE — Assessment & Plan Note (Signed)
-   patient has history of CKD3b. Baseline creat ~ 1.6 - 1.8, eGFR~ 39-43

## 2024-08-03 NOTE — Assessment & Plan Note (Signed)
-   As needed medications for now

## 2024-08-03 NOTE — Plan of Care (Signed)
  Problem: Coping: Goal: Ability to adjust to condition or change in health will improve Outcome: Not Progressing   Problem: Activity: Goal: Risk for activity intolerance will decrease Outcome: Not Progressing   Problem: Nutrition: Goal: Adequate nutrition will be maintained Outcome: Not Progressing

## 2024-08-03 NOTE — Assessment & Plan Note (Addendum)
-   Unstageable - chronic eschar appearance; no surgical needs nor candidacy at this time - continue wound care dressings and pressure offloading  - Wound care also consulted

## 2024-08-03 NOTE — Assessment & Plan Note (Signed)
-   MRI sacrum also shows ill-defined right perirectal fluid collection suspicious for intrapelvic abscess -Offered IR consult to daughter after further discussion with her mom in regards to how aggressive to be with underlying infections.  Other alternative would be transitioning to a comfort care approach with hospice -For now continuing antibiotics

## 2024-08-03 NOTE — Consult Note (Signed)
 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 male  with past medical history of dementia, prior CVA with right side hemiplegia, liver failure s/p transplant, ESRD s/p renal transplant, seizure disorder admitted on 08/02/2024 with lethargy and leukocytosis.  Per H&P on 12/2 by Dahal MD, patient has been physically and cognitively been declining after discussion with family. Patient has had more diarrhea 2/2 antibiotics for recurrent UTIs resulting in contamination of a sacral decubitus ulcer. Sources of infection include urine and sacral ulcer given history of recurrent UTI as well as increased diarrhea which increases likelihood of contamination of wound.   CXR 08/02/2024 demonstrates hypoinflation without acute findings concerning for pneumonia. X-ray 08/02/2024 of left foot with heel pressure injury does not suggest osteomyelitis. MRI of pelvis on 08/02/2024 demonstrated small sinus tract extending to sacrum and coccyx that is suspicious for osteomyelitis as well as intrapelvic abscess. Media review of sacral ulcer and left heel pressure injury on 08/02/2024 shows significant deterioration compared to prior images taken on 01/05/2024. Leukocytosis without fever but with possible sources of infection  including sacral ulcer and intrapelvic abscess.   Palliative medicine consulted for goals of care conversation.   SUMMARY OF RECOMMENDATIONS   Full code, full scope, family would like more time for further considerations of medical interventions Continue current management Plan to discuss further on 12/4 after family has more time to process and discuss further with primary team  Symptom Management:  Per primary team  Code Status: Full Code  Prognosis:  Unable to determine  Discharge Planning:  To Be Determined   Discussed with: [name] MD 08/03/2024 about discussion with family and possible pathways and interventions going forward and that family would like more time to consider after more discussion with other family members.    Subjective:   Reviewed medical records, received report from team, assessed the patient and then meet at the patient's bedside to discuss diagnosis, prognosis, GOC, EOL wishes disposition and options.  I met with patient and daughter bedside and joined on phone by wife. Dr. Oneita of PMT present during visit.    We meet to discuss diagnosis prognosis, GOC, EOL wishes, disposition and options. Concept of Palliative Care was introduced as specialized medical care for people and their families living with serious illness.  If focuses on providing relief from the symptoms and stress of a serious illness.  The goal is to improve quality of life for both the patient and the family. Values and goals of care important to patient and family were attempted to be elicited.  Created space and opportunity for patient  and family  to explore thoughts and feelings regarding current medical situation   Natural trajectory and current clinical status were discussed. Questions and concerns addressed. Patient encouraged to call with questions or concerns.    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 [employer] - Patient remains active in family gatherings and has a lot of support for care  Baseline Status: - Patient has been bedbound over the last 3 years since his last major CVA - Has been taken care of by his wife and daughter at home - Has private caregiver who visits 4-5 hours a day for 5 days a week that is paid for by [employer] benefits - Has wound care nurse that provides care 2 times a week for many months now but wound has still progressed significantly since 12/2023 - Has become minimally interactive with family in the last couple of months  Today's Discussion: - Discussed with family about overall health prior to current hospitalization, current hospitalization, and possible pathways going forward - Discussed with family that given the significant sacral wound with possible osteomyelitis, this may be the beginning of a cycle of recurrent hospitalization - Educated family that the chances of healing the sacral ulcer is highly unlikely even if debridement were pursued given the patient's bedbound status as well as questionable nutritional intake required for proper wound healing - Discussed that the medical team is not questioning the care he is receiving at home and that it sounds like the family has been providing proper care - Discussed HCPOA and Advanced Directives with family and they shared that the patient's wife Treasa) is documented HCPOA and the directives state that the patient would want all available medical  interventions to prolong life - Discussed code status with family and family confirmed that their desire and the patient's previously stated desire is to pursue full code measures at this time - Shared with family that the patient may require more frequent hospitalization to receive treatment that may provide some more quality time spent at home with family, but that there may come a time that continued hospitalization does not provide the quality time that is desired at home and continued aggressive medical interventions may not align with the values and goals of the patient and family going forward and that hospice might be a possible consideration at that point - Offered family some more time to consider the options available to them and to wait to speak with the primary medical team to get insight on their plans and the palliative medical team will return to further discuss possible options going forward  Review of Systems  Unable to perform ROS   Objective:   Primary Diagnoses: Present on Admission:  SIRS (systemic inflammatory response syndrome) (HCC)   Vital Signs:  BP (!) 122/56 (BP Location: Left Arm)   Pulse 79   Temp 98.7 F (37.1 C) (Oral)   Resp 18   SpO2 98%   Physical  Exam Constitutional:      Appearance: He is ill-appearing.     Comments: Cachectic  HENT:     Head: Normocephalic.     Nose: Nose normal.  Pulmonary:     Effort: Pulmonary effort is normal.  Abdominal:     General: Abdomen is flat.    Palliative Assessment/Data: 30%    Thank you for allowing us  to participate in the care of Gilbert Wilson PMT will continue to support holistically.  I personally spent a total of 75 minutes in the care of the patient today including preparing to see the patient, getting/reviewing separately obtained history, performing a medically appropriate exam/evaluation, counseling and educating, referring and communicating with other health care professionals, and  documenting clinical information in the EHR.   Signed by: Fairy FORBES Shan DEVONNA Palliative Medicine Team  Team Phone # [phone] (Nights/Weekends)  08/03/2024, 10:43 AM    Attending Addendum:  Mr. Gilbert Wilson is a 78 year old male with history of dementia, prior CVA, liver failure status post transplant, end-stage renal disease status post transplant, seizure disorder admitted with lethargy and leukocytosis.  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  He lives at home with his wife and daughter and also has VA caregiver in the home for several hours to assist in his care.  Discussed advance care planning and goals of care in light of worsening ulcer that is not likely to heal regardless of interventions.  Discussed overall goal is to return home, however, they also expressed desire to continue with any and all offered interventions.    Encouraged them to focus on what the end goal is for his care and then determine what medical interventions are likely to lead to the hoped outcome.  Discussed that with his continued frailty, heroic interventions are unlikely to result in him being well enough to return home in the event of cardiac or respiratory arrest.  Advised them to continue conversation with primary team regarding best options for his care plan for unhealable ulcer.  Palliative care to follow-up tomorrow for continued family support and continuation of conversation based upon his clinical course.  I was present for all aspects of encounter noted above.  I personally reviewed documentation and agree with assessment and plan made in conjunction with Fairy Shan, PA.  I personally spent a total of 80 minutes in the care of the patient today including preparing to see the patient, getting/reviewing separately obtained history, performing a medically appropriate exam/evaluation, counseling and educating, referring and communicating with other health care  professionals, documenting clinical information in the EHR, and coordinating care.  Amaryllis Meissner, MD [facility] Palliative Medicine Team [phone]

## 2024-08-03 NOTE — TOC CM/SW Note (Addendum)
 Notified [facility] of admission   Per Chart from home with wife and daughter .   History of [facility] for home health   PCP is Ludivina Like at [place], [place] is Harlene Burnet [phone] ext [phone]   Patient active with [facility] for [facility]   Await Palliative Care Consult and recommendations   Assessment to follow

## 2024-08-03 NOTE — Assessment & Plan Note (Signed)
-   given no plans for procedures, will resume Eliquis  on 12/6 if able (given acute worsening of Hgb still) - rate controlled

## 2024-08-03 NOTE — Assessment & Plan Note (Addendum)
-   chronic urinary retention; does CIC at home by family multiples times daily - agree with foley while hospitalized and discussed further with wife on 12/6 and we will plan to continue foley at discharge for simplicity especially since already getting recurrent UTIs with CIC and going home with palliative care in place or at least [facility] to help change foley monthly. - UA not checked on admission; will be covered under current regimen - continue indwelling foley at discharge as noted above; HHRN to change monthly

## 2024-08-03 NOTE — Assessment & Plan Note (Signed)
-   Mood stable; mentation at baseline.  Minimally interactive

## 2024-08-03 NOTE — Assessment & Plan Note (Signed)
 Continue cyclosporine  and mycophenolate 

## 2024-08-03 NOTE — Assessment & Plan Note (Signed)
-   Somewhat poor intake at home intermittently -Low albumin and suspected low prealbumin -Also signs of third spacing throughout

## 2024-08-03 NOTE — Progress Notes (Addendum)
 Progress Note    ESGAR BARNICK   FMW:[id number]  DOB: [birth year]  DOA: 08/02/2024     1 PCP: [name]  Initial CC: Lethargy  Hospital Course: Gilbert Wilson is a 78 y.o. male with PMH significant for severe dementia, h/o stroke with right hemiparesis bedbound status for about the past 3 years, total care dependent, h/o renal and liver transplant, seizure disorder, bladder outlet obstruction requiring in and out cath 4 times a day, frequent UTI, DVT and A-fib no longer on AC, osteoarthritis, sacral decubitus and left ankle pressure skin injury. Was hospitalized in April 2025 for 2 weeks for CHF exacerbation and again in May for flareup of left knee osteoarthritis.  He is cared for at home by his wife and daughter. Since about April 2025 he has had persistent and progressing sacral ulcer along with left heel ulcer.   Family endorsed on admission that patient has been noticeably progressively declining both physically and mentally. He has difficulty endorsing pain but has been noted to have pain involving his sacrum from a pressure ulcer. Recent courses of antibiotics for UTI have caused diarrhea which has further created difficulty managing his sacral wound.  Due to worsening lethargy, he was brought to the ER for further evaluation. Workup was concerning for sepsis and he was admitted for more workup.  Interval History:  Resting comfortably when seen in bed.  Daughter is present bedside.  Reviewed workup thus far including imaging studies. Also has met with palliative care this morning.  Family wishes to discuss plans further regarding how aggressive to be for treatment.  Assessment and Plan: * Sacral ulcer (HCC) - leukocytosis main variable; he was afebrile, overall non-toxic appearing but has persistent sacral ulcer and worsening lethargy on admission - MRI sacrum shows multiple findings: Soft tissue ulceration over distal sacrum and coccyx with probable small  sinus tract extending to bone suspicious for early OM; additional decubitus ulcer and deep sinus tract extending to the right ischial tuberosity - not a surgical candidate given poor functional status, nutritional status, bedbound, no chance of healing wound - continue local wound care - continue abx for now  Perirectal abscess - MRI sacrum also shows ill-defined right perirectal fluid collection suspicious for intrapelvic abscess -Offered IR consult to daughter after further discussion with her mom in regards to how aggressive to be with underlying infections.  Other alternative would be transitioning to a comfort care approach with hospice -For now continuing antibiotics  Goals of care, counseling/discussion - Patient has had progressive decline over the past several months.  Seemingly since April 2025 he has been unable to heal sacral ulcer and has had recurrent infections. -Agree with palliative care consult on admission. -For now continuing antibiotics.  Daughter wishes to discuss how aggressive to be with her mom on arrival today  Chronic ulcer of left heel (HCC) - Unstageable - chronic eschar appearance; no surgical needs nor candidacy at this time - continue wound care dressings and pressure offloading   History of CVA with residual deficit - Chronic left hemiparesis and bedbound at baseline -Fully dependent on all ADLs   Liver transplant recipient [facility]) - Continue cyclosporine  and mycophenolate   History of renal transplant - Continue cyclosporine  and mycophenolate   Chronic kidney disease, stage 3b (HCC) - patient has history of CKD3b. 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  Minimally interactive  Atrial fibrillation (HCC) - Hold Eliquis  for now.  Rate controlled  Essential hypertension - As needed medications for now  Type 2 diabetes mellitus (HCC) - Continue SSI and CBG monitoring   Antimicrobials: Rocephin  12/2 >> current  Vanc 12/2 >> current   DVT prophylaxis:  enoxaparin  (LOVENOX ) injection 30 mg Start: 08/02/24 1700   Code Status:   Code Status: Full Code  Mobility Assessment (Last 72 Hours)     Mobility Assessment     Row Name 08/03/24 1338 08/02/24 2013         Does the patient have exclusion criteria? Yes- Bedfast (Level 1) - Select exclusion criteria in next row Yes- Bedfast (Level 1) - Select exclusion criteria in next row      Mobility Assessment Exclusion Criteria No exclusion criteria present, perform mobility assessment No exclusion criteria present, perform mobility assessment      What is the highest level of mobility based on the mobility assessment? Level 2 (Chairfast) - Balance while sitting on edge of bed and cannot stand Level 2 (Chairfast) - Balance while sitting on edge of bed and cannot stand      Is the above level different from baseline mobility prior to current illness? Yes - Recommend PT order Yes - Recommend PT order         Diet: Diet Orders (From admission, onward)     Start     Ordered   08/02/24 1611  DIET DYS 2 Room service appropriate? Yes; Fluid consistency: Thin  Diet effective now       Question Answer Comment  Room service appropriate? Yes   Fluid consistency: Thin      08/02/24 1610            Barriers to discharge: none  Disposition Plan:  TBD HH orders placed: TBD Status is: Inpt  Objective: Blood pressure (!) 122/56, pulse 79, temperature 98.7 F (37.1 C), temperature source Oral, resp. rate 18, SpO2 98%.  Examination:  Physical Exam Constitutional:      Comments: Chronically ill-appearing  elderly gentleman lying in bed in no distress  HENT:     Head: Normocephalic and atraumatic.     Mouth/Throat:     Mouth: Mucous membranes are dry.  Eyes:     Extraocular Movements: Extraocular movements intact.  Cardiovascular:     Rate and Rhythm: Normal rate and regular rhythm.     Heart sounds: Normal heart sounds.  Pulmonary:     Effort: Pulmonary effort is normal. No respiratory distress.     Breath sounds: Normal breath sounds. No wheezing.  Abdominal:     General: Bowel sounds are normal. There is no distension.     Palpations: Abdomen is soft.     Tenderness: There is no abdominal tenderness.  Musculoskeletal:        General: Swelling (Generalized edema in all 4 extremities) present.     Cervical back: Normal range of motion and neck supple.     Right lower leg: Edema (2+) present.     Left lower leg: Edema (2+) present.     Comments: Left foot wrapped in dressing  Skin:    General: Skin is warm and dry.  Neurological:     Comments: Nods head appropriately  to questions.  Able to say a few words.      Consultants:    Procedures:    Data Reviewed: Results for orders placed or performed during the hospital encounter of 08/02/24 (from the past 24 hours)  Blood culture (routine x 2)     Status: None (Preliminary result)   Collection Time: 08/02/24  3:00 PM   Specimen: BLOOD  Result Value Ref Range   Specimen Description BLOOD LEFT ANTECUBITAL    Special Requests      BOTTLES DRAWN AEROBIC AND ANAEROBIC Blood Culture results may not be optimal due to an inadequate volume of blood received in culture bottles   Culture      NO GROWTH < 24 HOURS Performed at [facility], [address] [place], [place] [address]    Report Status PENDING   Glucose, capillary     Status: None   Collection Time: 08/02/24  6:51 PM  Result Value Ref Range   Glucose-Capillary 97 70 - 99 mg/dL  Blood culture (routine x 2)     Status: None (Preliminary result)   Collection Time:  08/02/24  8:31 PM   Specimen: BLOOD LEFT HAND  Result Value Ref Range   Specimen Description BLOOD LEFT HAND    Special Requests      BOTTLES DRAWN AEROBIC AND ANAEROBIC Blood Culture results may not be optimal due to an inadequate volume of blood received in culture bottles   Culture      NO GROWTH < 12 HOURS Performed at [facility], [address] [place], [place] [address]    Report Status PENDING   Glucose, capillary     Status: None   Collection Time: 08/02/24  9:11 PM  Result Value Ref Range   Glucose-Capillary 82 70 - 99 mg/dL  Basic metabolic panel     Status: Abnormal   Collection Time: 08/03/24  5:00 AM  Result Value Ref Range   Sodium 144 135 - 145 mmol/L   Potassium 4.2 3.5 - 5.1 mmol/L   Chloride 119 (H) 98 - 111 mmol/L   CO2 15 (L) 22 - 32 mmol/L   Glucose, Bld 62 (L) 70 - 99 mg/dL   BUN 63 (H) 8 - 23 mg/dL   Creatinine, Ser 8.20 (H) 0.61 - 1.24 mg/dL   Calcium  8.9 8.9 - 10.3 mg/dL   GFR, Estimated 39 (L) >60 mL/min   Anion gap 10 5 - 15  CBC     Status: Abnormal   Collection Time: 08/03/24  5:00 AM  Result Value Ref Range   WBC 10.2 4.0 - 10.5 K/uL   RBC 2.86 (L) 4.22 - 5.81 MIL/uL   Hemoglobin 7.0 (L) 13.0 - 17.0 g/dL   HCT 77.5 (L) 60.9 - 47.9 %   MCV 78.3 (L) 80.0 - 100.0 fL   MCH 24.5 (L) 26.0 - 34.0 pg   MCHC 31.3 30.0 - 36.0 g/dL   RDW 83.3 (H) 88.4 - 84.4 %   Platelets 394 150 - 400 K/uL   nRBC 0.0 0.0 - 0.2 %  Glucose, capillary     Status: Abnormal   Collection Time: 08/03/24  8:14 AM  Result Value Ref Range   Glucose-Capillary 58 (L) 70 - 99 mg/dL  Glucose, capillary     Status: None   Collection Time: 08/03/24  9:24 AM  Result Value Ref Range   Glucose-Capillary 87 70 - 99 mg/dL  Glucose, capillary     Status: Abnormal  Collection Time: 08/03/24 12:06 PM  Result Value Ref Range   Glucose-Capillary 141 (H) 70 - 99 mg/dL    I have reviewed pertinent nursing notes, vitals, labs, and images as necessary. I have ordered labwork to  follow up on as indicated.  I have reviewed the last notes from staff over past 24 hours. I have discussed patient's care plan and test results with nursing staff, CM/SW, and other staff as appropriate.  Old records reviewed in assessment of this patient  Time spent: Greater than 50% of the 55 minute visit was spent in counseling/coordination of care for the patient as laid out in the A&P.   LOS: 1 day   Alm Apo, MD [facility] 08/03/2024, 2:20 PM

## 2024-08-03 NOTE — Hospital Course (Addendum)
 Gilbert Wilson is a 78 y.o. male with PMH significant for severe dementia, h/o stroke with right hemiparesis bedbound status for about the past 3 years, total care dependent, h/o renal and liver transplant, seizure disorder, bladder outlet obstruction requiring in and out cath 4 times a day, frequent UTI, DVT and A-fib no longer on AC, osteoarthritis, sacral decubitus and left ankle pressure skin injury. Was hospitalized in April 2025 for 2 weeks for CHF exacerbation and again in May for flareup of left knee osteoarthritis.  He is cared for at home by his wife and daughter. Since about April 2025 he has had persistent and progressing sacral ulcer along with left heel ulcer.  Family endorsed on admission that patient has been noticeably progressively declining both physically and mentally. He has difficulty endorsing pain but has been noted to have pain involving his sacrum from a pressure ulcer. Recent courses of antibiotics for UTI have caused diarrhea which has further created difficulty managing his sacral wound. Due to worsening lethargy, he was brought to the ER for further evaluation. Workup was concerning for sepsis and he was admitted for more workup with sacral ulcer, left heel ulcer, possible perirectal abscess (ruled out on CT). Palliative care also followed.  Essentially treated IV antibiotics and ID recommending 2 weeks oral at discharge.  Tried to discharge but hypernatremia and worsened anemia kept for 1 more night to modify fluids and transfuse 1 unit of blood.  Patient remained hospitalized due to leukocytosis low-grade temperature, continued on wound care and hydrotherapy. At this time remains hemodynamically stable WBC stable slightly downtrending no more fever recurrence I had extensive discussion with patient's daughter and wife about disposition: They are aware high risk of readmission as well as decompensation> he remains clinically stable, overall at baseline, After discussion  they feel comfortable going home today with home health. They would like to have palliative care referral.  Subjective: Seen and examined this morning Daughter at the bedside.  Wife on the phone. Overnight temperature <100  BP stable afebrile heart rate respirations stable Labs reviewed WBC downtrending CK downtrending BMP stable  Discharge Diagnoses:   Sacral ulcer: He had persistent sacral ulcer and worsening lethargy on admission. MRI sacrum shows multiple findings: Soft tissue ulceration over distal sacrum and coccyx with probable small sinus tract extending to bone suspicious for early OM; additional decubitus ulcer and deep sinus tract extending to the right ischial tuberosity Not a surgical candidate given poor functional status, nutritional status, bedbound and no chance of healing wound.  Continue to continue wound care, antibiotics as per ID, general surgery was consulted 12/4.  Advised local debridement/hydrotherapy wound care as per PT ,WOCN On ceftriaxone , daptomycin  with CK monitoring-CK is rising-ID recommended to switch daptomycin  to doxycycline  12/12, continued on ceftriaxone  >WBC downtrending slightly, CK improving-given IVF overnight. She will need 2 weeks of antibiotics total including inpatient as per ID.  Advise CBC check in 1 week with home health.  Patient will continue wound care at home along with hospital bed with ER mattress offloading nutritional augmentation.  Lethargy Intermittent tachypnea Dementia at baseline minimally interactive: Patient usually is lethargic and sleepy after eating, he does have intermittent tachypnea which he gets at home too.  This morning he is alert awake. Chest x-ray was done and unremarkable 2 days ago, continue anxiolytics, supportive care monitor for any signs of infection Seen by palliative care again and after further discussion recommending Ativan  for anxiety [facility]  Perirectal abscess-resolved  as of  08/05/2024 MRI sacrum also shows ill-defined right perirectal fluid collection suspicious for intrapelvic abscess IR consulted to evaluate if abscess able to be drained CT pelvis performed on 08/04/2024 and ruled out presence of abscess No further need for IR attempted drainage given CT findings See antibiotic plan for sacral ulcer   Low grade temp-resolved Leukocytosis: Some cough.  Chest x-ray and UA unremarkable, has leukocytosis but afebrile now and WBC downtrending, on antibiotics as above.  Monitor CBC -WBC continues to improve slowly as below Recent Labs  Lab 08/09/24 0441 08/10/24 0132 08/11/24 0441 08/12/24 0559 08/13/24 0439  WBC 11.3* 13.9* 16.1* 15.7* 15.6*    Hypernatremia suspect from volume depletion poor oral intake sodium nicely improving  from peak of 154sas below. Off IVF, cont to encourage oral intake  Protein-calorie malnutrition, severe Somewhat poor intake at home intermittently. Low albumin  and suspected low prealbumin Also signs of third spacing throughout   Goals of care, counseling/discussion Patient has had progressive decline over the past several months.  Seemingly since April 2025 he has been unable to heal sacral ulcer and has had recurrent infections. For now continuing antibiotics.  Daughter and mother have discussed further and for now wishing to remain aggressive for this hospitalization.  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 and bedbound at baseline Fully dependent on all ADLs    Anemia of chronic disease Baseline hemoglobin appears to be around 8.5 to 10  g/dL At baseline on admission with mild gradual drop; sacral wound has been mildly bleeding at home prior to admission as well which is likely source along with underlying CKD and some probable hemodilution with IVF-Patient does get intermittent Epogen injections outpatient; family wishing to hold off on blood transfusion for now and we will use ESA agent (Aranesp ) for now (given on 12/5) since Hgb 6.3 g/dL on 87/1, S/P 1 unit PRBC and hemoglobin has nicely improved around 8 g now.     Liver transplant recipient: History of renal transplant Continue cyclosporine  and mycophenolate    Chronic kidney disease, stage 3b Metabolic acidosis: patient has history of CKD3b. Baseline creat ~ 1.6 - 1.8, eGFR~ 39-43.  Resume home oral bicarb Recent Labs    08/05/24 0430 08/06/24 0139 08/07/24 0829 08/08/24 0431 08/09/24 0441 08/09/24 1006 08/10/24 0132 08/11/24 0441 08/12/24 0559 08/13/24 0439  BUN 53* 50* 42* 42* 36* 35* 35* 33* 33* 38*  CREATININE 1.78* 1.61* 1.45* 1.45* 1.43* 1.43* 1.58* 1.27* 1.41* 1.45*  CO2 15* 17* 18* 17* 11* 23 19* 20* 18* 21*  K 4.2 4.2 4.4 4.6 4.2 4.3 4.1 4.3 4.6 4.8   Mild rhabdomyolysis: Discontinued daptomycin .  recheck CK downtrending encourage oral hydration   Bladder outlet obstruction chronic urinary retention; does CIC at home by family multiples times daily agree with foley while hospitalized and discussed further with wife on 12/6 and we will plan to continue foley at discharge for simplicity especially since already getting recurrent UTIs with CIC and going home with palliative care in place or at least [facility] to help change foley monthly. UA not checked on admission-but with antibiotic should cover Continue indwelling foley at discharge  as noted above; HHRN to change monthly   Atrial fibrillation  Eliquis  will resumed  as HB is stable  Essential hypertension As needed medications for now   Type 2 diabetes mellitus Continue SSI and CBG  monitoring  Loose stool: appears more soft now.  Monitor   DVT prophylaxis:eliquis   Code Status:   Code Status: Full Code Family Communication: plan of care discussed with patient/DAUGHTER at bedside again. Patient status is: Remains hospitalized because of severity of illness Level of care: Med-Surg   Dispo: The patient is from: home            Anticipated disposition: Home with family today.  Objective: Vitals last 24 hrs: Vitals:   08/13/24 0530 08/13/24 0945 08/13/24 0959 08/13/24 1048  BP: (!) 157/75 (!) 150/58 (!) 160/70 (!) 165/78  Pulse: (!) 105  97   Resp: 18  16   Temp:   99.3 F (37.4 C)   TempSrc:   Oral   SpO2: 95%  94%   Weight:      Height:        Physical Examination: General exam: Alert awake HEENT:Oral mucosa moist, Ear/Nose WNL grossly Respiratory system: CTA bilaterally, tachypneic Cardiovascular system: S1 & S2 +, No JVD. Gastrointestinal system: Abdomen soft,NT,ND, BS+ Nervous System: Alert awake Extremities: extremities warm, leg edema + Skin: Warm, no rashes MSK: thin muscle bulk,tone, power . Foley+  Medications reviewed:  Scheduled Meds:  amLODipine   5 mg Oral Daily   apixaban   5 mg Oral BID   Chlorhexidine  Gluconate Cloth  6 each Topical Q0600   collagenase    Topical Daily   cycloSPORINE   130 mg Oral BID   darbepoetin (ARANESP ) injection - DIALYSIS  60 mcg Subcutaneous Q Fri-1800   doxycycline   100 mg Oral BID   ferrous sulfate   325 mg Oral Q breakfast   finasteride   5 mg Oral QHS   insulin  aspart  0-5 Units Subcutaneous QHS   insulin  aspart  0-9 Units Subcutaneous TID WC   lactobacillus   Oral TID   levETIRAcetam   750 mg Oral BID   mirtazapine   7.5 mg Oral QHS   mycophenolate   180 mg Oral BID   nutrition supplement (JUVEN)  1 packet Oral BID BM   pantoprazole   40 mg Oral Daily   sodium bicarbonate   650 mg Oral BID   tamsulosin   0.4 mg Oral Daily   Continuous Infusions:  cefTRIAXone  (ROCEPHIN )  IV 2 g (08/12/24 1651)    Diet: Diet Order             DIET - DYS 1 Fluid consistency: Nectar Thick  Diet effective now

## 2024-08-03 NOTE — Progress Notes (Signed)
 16Fr Coude catheter with 10cc syringe placed under sterile procedure with clear yellow urine return. Pt tolerated well with lidocaine  gel.   Durwood Garland  RN

## 2024-08-03 NOTE — Progress Notes (Signed)
 Patient's blood glucose was 58, remains asymptomatic. 8 oz of juice given, blood sugar now 87. Breakfast ordered. All needs met at this time. Bed in lowest position and call light within reach.

## 2024-08-03 NOTE — Assessment & Plan Note (Addendum)
-   leukocytosis main variable; he was afebrile, overall non-toxic appearing but has persistent sacral ulcer and worsening lethargy on admission - MRI sacrum shows multiple findings: Soft tissue ulceration over distal sacrum and coccyx with probable small sinus tract extending to bone suspicious for early OM; additional decubitus ulcer and deep sinus tract extending to the right ischial tuberosity - not a surgical candidate given poor functional status, nutritional status, bedbound, no chance of healing wound - continue local wound care - continue abx for now - Evaluated by general surgery also on 08/04/2024, appreciate recommendations - Undergoing local debridement with PT and lieu of hydrotherapy; coverage on daptomycin  and Rocephin  then planning to transition to doxycycline  and Augmentin  for 2 weeks at discharge per ID recommendations; scripts sent to [facility] on 12/8 for anticipated discharge on 12/9

## 2024-08-03 NOTE — Assessment & Plan Note (Signed)
-   Continue SSI and CBG monitoring ?

## 2024-08-03 NOTE — Assessment & Plan Note (Signed)
-   Chronic left hemiparesis and bedbound at baseline -Fully dependent on all ADLs

## 2024-08-03 NOTE — Assessment & Plan Note (Signed)
-   Patient has had progressive decline over the past several months.  Seemingly since April 2025 he has been unable to heal sacral ulcer and has had recurrent infections. -Agree with palliative care consult on admission. -For now continuing antibiotics.  Daughter wishes to discuss how aggressive to be with her mom on arrival today

## 2024-08-04 ENCOUNTER — Encounter (HOSPITAL_COMMUNITY): Payer: Self-pay | Admitting: Internal Medicine

## 2024-08-04 ENCOUNTER — Inpatient Hospital Stay (HOSPITAL_COMMUNITY)

## 2024-08-04 DIAGNOSIS — K626 Ulcer of anus and rectum: Secondary | ICD-10-CM

## 2024-08-04 DIAGNOSIS — N189 Chronic kidney disease, unspecified: Secondary | ICD-10-CM

## 2024-08-04 DIAGNOSIS — M4628 Osteomyelitis of vertebra, sacral and sacrococcygeal region: Secondary | ICD-10-CM | POA: Diagnosis not present

## 2024-08-04 DIAGNOSIS — L89159 Pressure ulcer of sacral region, unspecified stage: Secondary | ICD-10-CM | POA: Diagnosis not present

## 2024-08-04 DIAGNOSIS — L97429 Non-pressure chronic ulcer of left heel and midfoot with unspecified severity: Secondary | ICD-10-CM

## 2024-08-04 DIAGNOSIS — Z940 Kidney transplant status: Secondary | ICD-10-CM

## 2024-08-04 DIAGNOSIS — E46 Unspecified protein-calorie malnutrition: Secondary | ICD-10-CM

## 2024-08-04 DIAGNOSIS — Z7401 Bed confinement status: Secondary | ICD-10-CM

## 2024-08-04 DIAGNOSIS — E43 Unspecified severe protein-calorie malnutrition: Secondary | ICD-10-CM

## 2024-08-04 DIAGNOSIS — Z944 Liver transplant status: Secondary | ICD-10-CM

## 2024-08-04 DIAGNOSIS — L98429 Non-pressure chronic ulcer of back with unspecified severity: Secondary | ICD-10-CM

## 2024-08-04 DIAGNOSIS — L89319 Pressure ulcer of right buttock, unspecified stage: Secondary | ICD-10-CM

## 2024-08-04 DIAGNOSIS — Z7189 Other specified counseling: Secondary | ICD-10-CM

## 2024-08-04 LAB — CBC WITH DIFFERENTIAL/PLATELET
Abs Immature Granulocytes: 0.38 K/uL — ABNORMAL HIGH (ref 0.00–0.07)
Basophils Absolute: 0.1 K/uL (ref 0.0–0.1)
Basophils Relative: 1 %
Eosinophils Absolute: 0.2 K/uL (ref 0.0–0.5)
Eosinophils Relative: 2 %
HCT: 26 % — ABNORMAL LOW (ref 39.0–52.0)
Hemoglobin: 8 g/dL — ABNORMAL LOW (ref 13.0–17.0)
Immature Granulocytes: 4 %
Lymphocytes Relative: 13 %
Lymphs Abs: 1.3 K/uL (ref 0.7–4.0)
MCH: 24.5 pg — ABNORMAL LOW (ref 26.0–34.0)
MCHC: 30.8 g/dL (ref 30.0–36.0)
MCV: 79.5 fL — ABNORMAL LOW (ref 80.0–100.0)
Monocytes Absolute: 0.8 K/uL (ref 0.1–1.0)
Monocytes Relative: 8 %
Neutro Abs: 7.2 K/uL (ref 1.7–7.7)
Neutrophils Relative %: 72 %
Platelets: 255 K/uL (ref 150–400)
RBC: 3.27 MIL/uL — ABNORMAL LOW (ref 4.22–5.81)
RDW: 17 % — ABNORMAL HIGH (ref 11.5–15.5)
Smear Review: NORMAL
WBC: 10 K/uL (ref 4.0–10.5)
nRBC: 0 % (ref 0.0–0.2)

## 2024-08-04 LAB — GLUCOSE, CAPILLARY
Glucose-Capillary: 117 mg/dL — ABNORMAL HIGH (ref 70–99)
Glucose-Capillary: 127 mg/dL — ABNORMAL HIGH (ref 70–99)
Glucose-Capillary: 132 mg/dL — ABNORMAL HIGH (ref 70–99)
Glucose-Capillary: 161 mg/dL — ABNORMAL HIGH (ref 70–99)

## 2024-08-04 LAB — BASIC METABOLIC PANEL WITH GFR
Anion gap: 9 (ref 5–15)
BUN: 59 mg/dL — ABNORMAL HIGH (ref 8–23)
CO2: 16 mmol/L — ABNORMAL LOW (ref 22–32)
Calcium: 8.8 mg/dL — ABNORMAL LOW (ref 8.9–10.3)
Chloride: 118 mmol/L — ABNORMAL HIGH (ref 98–111)
Creatinine, Ser: 1.82 mg/dL — ABNORMAL HIGH (ref 0.61–1.24)
GFR, Estimated: 38 mL/min — ABNORMAL LOW (ref >60)
Glucose, Bld: 137 mg/dL — ABNORMAL HIGH (ref 70–99)
Potassium: 4.4 mmol/L (ref 3.5–5.1)
Sodium: 143 mmol/L (ref 135–145)

## 2024-08-04 LAB — MAGNESIUM: Magnesium: 1.8 mg/dL (ref 1.7–2.4)

## 2024-08-04 MED ORDER — SODIUM BICARBONATE 650 MG PO TABS
650.0000 mg | ORAL_TABLET | Freq: Three times a day (TID) | ORAL | Status: DC
  Administered 2024-08-04 – 2024-08-07 (×10): 650 mg via ORAL
  Filled 2024-08-04 (×10): qty 1

## 2024-08-04 MED ORDER — IOHEXOL 350 MG/ML SOLN
75.0000 mL | Freq: Once | INTRAVENOUS | Status: AC | PRN
  Administered 2024-08-04: 75 mL via INTRAVENOUS

## 2024-08-04 MED ORDER — SODIUM CHLORIDE 0.9 % IV BOLUS
250.0000 mL | Freq: Once | INTRAVENOUS | Status: AC
  Administered 2024-08-04: 250 mL via INTRAVENOUS

## 2024-08-04 MED ORDER — DAPTOMYCIN-SODIUM CHLORIDE 700-0.9 MG/100ML-% IV SOLN
8.0000 mg/kg | Freq: Every day | INTRAVENOUS | Status: DC
  Administered 2024-08-05 – 2024-08-11 (×7): 700 mg via INTRAVENOUS
  Filled 2024-08-04 (×8): qty 100

## 2024-08-04 MED ORDER — HYDROMORPHONE HCL 2 MG PO TABS
1.0000 mg | ORAL_TABLET | ORAL | Status: DC | PRN
  Administered 2024-08-05 – 2024-08-12 (×6): 2 mg via ORAL
  Filled 2024-08-04 (×7): qty 1

## 2024-08-04 MED ORDER — MEDIHONEY WOUND/BURN DRESSING EX PSTE
1.0000 | PASTE | Freq: Every day | CUTANEOUS | Status: DC
  Administered 2024-08-04 – 2024-08-06 (×3): 1 via TOPICAL
  Filled 2024-08-04: qty 44

## 2024-08-04 NOTE — Consult Note (Signed)
 Gilbert Wilson Apr 12, 1946  [id number].    Requesting MD: Dr. Alm Apo Chief Complaint/Reason for Consult: sacral wound  HPI:  This is a pleasant 78yo black male with a history of dementia as well as several CVAs who is total care dependent.  He lives at home with his daughter and wife who help take care of him with the aid of a caregiver several times a week.  The daughter provides the history.  He has had this sacral wound since about March/April.  It started out small and actually healed, but then quickly redeveloped and progressed.  He has been seen by the [facility] for management in the past and they have used both medihoney as well as santyl.  They have an air mattress at home for him.  They try to frequently turn him; however, he tends to work his way off the pillows, etc and ends up back on his back.  He currently has gentamicin  cream ordered for this after a culture was done at the [place].  He does grimace some with movement so it is suspected that he has some pain with this.  The daughter states they get him up to his wheelchair 3-4x/week.  He is currently admitted with ID following for osteomyelitis as well as possible pelvic/perirectal fluid collection noted on MRI.  Recommendation by ID was made for surgical consult to evaluate his sacral wound.  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 His smoking use included cigarettes. He has never used smokeless tobacco. He reports that he does not drink alcohol and does not use drugs.  Allergies:  Allergies  Allergen Reactions   Linezolid Other (See Comments)    Seizures   Levaquin [Levofloxacin] Itching   Lisinopril Cough    Medications Prior to Admission  Medication Sig Dispense Refill   acetaminophen  (TYLENOL ) 500 MG tablet Take 500-750 mg by mouth every 6 (six) hours as needed for mild pain (pain score 1-3).     albuterol  (VENTOLIN  HFA) 108 (90 Base) MCG/ACT inhaler Inhale 2 puffs into the lungs every 6 (six) hours as needed for wheezing or shortness of breath.     amLODipine  (NORVASC ) 5 MG tablet Take 1 tablet (5 mg total) by mouth daily.     apixaban  (ELIQUIS ) 5 MG TABS tablet Take 1 tablet (5 mg total) by mouth 2 (two) times daily. 60 tablet 2   cycloSPORINE  (NEORAL ) 100 MG/ML microemulsion solution Take 130 mg by mouth 2 (two) times daily. 1.9ml     epoetin alfa (EPOGEN) 10000 UNIT/ML injection Inject 10,000 Units into the skin every Tuesday.     ferrous sulfate  325 (  65 FE) MG tablet Take 325 mg by mouth daily with breakfast.     furosemide  (LASIX ) 20 MG tablet Take 1 tablet (20 mg total) by mouth daily as needed for edema. 30 tablet 0   gentamicin  cream (GARAMYCIN ) 0.1 % Apply 1 Application topically daily. Apply to wound     guaiFENesin  (MUCINEX ) 600 MG 12 hr tablet Take 600 mg by mouth 2 (two) times daily as needed for to loosen phlegm.     insulin  glargine (LANTUS  SOLOSTAR) 100 UNIT/ML Solostar Pen Inject 0-6 Units into the skin at bedtime. If BS>130-Take 6 units, Increase 1 unit for every 10 mg/dl above 869.     levETIRAcetam  (KEPPRA ) 750 MG tablet Take 1 tablet (750 mg total) by mouth 2 (two) times daily. 60 tablet 0   mycophenolate  (MYFORTIC ) 180 MG EC tablet Take 180 mg by mouth 2 (two) times daily.     nutrition supplement, JUVEN, (JUVEN) PACK  Take 1 packet by mouth 2 (two) times daily between meals.     Nutritional Supplements (ENSURE HIGH PROTEIN) LIQD Take 237 mLs by mouth in the morning, at noon, and at bedtime.     nystatin  powder Apply 1 Application topically daily.     omeprazole (PRILOSEC) 20 MG capsule Take 20 mg by mouth daily as needed (acid reflex).     polyethylene glycol (MIRALAX  / GLYCOLAX ) 17 g packet Take 34 g by mouth daily.     senna-docusate (SENOKOT-S) 8.6-50 MG tablet Take 1-2 tablets by mouth 2 (two) times daily between meals as needed for mild constipation or moderate constipation.     sodium bicarbonate  650 MG tablet Take 650 mg by mouth 2 (two) times daily.     tamsulosin  (FLOMAX ) 0.4 MG CAPS capsule Take 0.4 mg by mouth daily.     traMADol  (ULTRAM ) 50 MG tablet Take 25-50 mg by mouth at bedtime as needed for moderate pain (pain score 4-6).     finasteride  (PROSCAR ) 5 MG tablet Take 5 mg by mouth at bedtime.     mirtazapine  (REMERON ) 15 MG tablet Take 0.5 tablets (7.5 mg total) by mouth at bedtime. (Patient taking differently: Take 15 mg by mouth at bedtime.) 30 tablet 0     Physical Exam: Blood pressure (!) 137/53, pulse 75, temperature 98.2 F (36.8 C), temperature source Oral, resp. rate 16, height 6' 1 (1.854 m), SpO2 100%. General: pleasant, WD, WN black male who is laying in bed in NAD HEENT: head is normocephalic, atraumatic.  Sclera are noninjected.  PERRL.  Ears and nose without any masses or lesions.  Mouth is pink and moist Lungs:  Respiratory effort nonlabored Skin: warm and dry. Relatively shallow, but still unstageable sacral decubitus ulcer.  There is a layer of some eschar noted on the right of this wound.  The rest of the wound is overall fairly clean.  There is no odor or purulent drainage to think this wound is infected.  There is also a small right gluteal crease wound that is also unstageable.  This is smaller, but this is covered with a tight/firm eschar.  This does not appear infected  with no surrounding erythema or drainage from this wound. Psych: Alert, nonverbal with me   Results for orders placed or performed during the hospital encounter of 08/02/24 (from the past 48 hours)  Blood culture (routine x 2)     Status: None (Preliminary result)   Collection Time: 08/02/24  3:00 PM   Specimen: BLOOD  Result Value Ref Range  Specimen Description BLOOD LEFT ANTECUBITAL    Special Requests      BOTTLES DRAWN AEROBIC AND ANAEROBIC Blood Culture results may not be optimal due to an inadequate volume of blood received in culture bottles   Culture      NO GROWTH 2 DAYS Performed at [facility], [address], [place], [place] [address]    Report Status PENDING   Glucose, capillary     Status: None   Collection Time: 08/02/24  6:51 PM  Result Value Ref Range   Glucose-Capillary 97 70 - 99 mg/dL    Comment: Glucose reference range applies only to samples taken after fasting for at least 8 hours.  Blood culture (routine x 2)     Status: None (Preliminary result)   Collection Time: 08/02/24  8:31 PM   Specimen: BLOOD LEFT HAND  Result Value Ref Range   Specimen Description BLOOD LEFT HAND    Special Requests      BOTTLES DRAWN AEROBIC AND ANAEROBIC Blood Culture results may not be optimal due to an inadequate volume of blood received in culture bottles   Culture      NO GROWTH 2 DAYS Performed at [facility], [address] [place], [place] [address]    Report Status PENDING   Hemoglobin A1c     Status: Abnormal   Collection Time: 08/02/24  8:31 PM  Result Value Ref Range   Hgb A1c MFr Bld 6.0 (H) 4.8 - 5.6 %    Comment: (NOTE)         Prediabetes: 5.7 - 6.4         Diabetes: >6.4         Glycemic control for adults with diabetes: <7.0    Mean Plasma Glucose 126 mg/dL    Comment: (NOTE) Performed At: [facility] [address] [place], [place] [id number] Jennette Shorter MD Ey:[id number]   Glucose, capillary     Status: None    Collection Time: 08/02/24  9:11 PM  Result Value Ref Range   Glucose-Capillary 82 70 - 99 mg/dL    Comment: Glucose reference range applies only to samples taken after fasting for at least 8 hours.  Basic metabolic panel     Status: Abnormal   Collection Time: 08/03/24  5:00 AM  Result Value Ref Range   Sodium 144 135 - 145 mmol/L   Potassium 4.2 3.5 - 5.1 mmol/L   Chloride 119 (H) 98 - 111 mmol/L   CO2 15 (L) 22 - 32 mmol/L   Glucose, Bld 62 (L) 70 - 99 mg/dL    Comment: Glucose reference range applies only to samples taken after fasting for at least 8 hours.   BUN 63 (H) 8 - 23 mg/dL   Creatinine, Ser 8.20 (H) 0.61 - 1.24 mg/dL   Calcium  8.9 8.9 - 10.3 mg/dL   GFR, Estimated 39 (L) >60 mL/min    Comment: (NOTE) Calculated using the CKD-EPI Creatinine Equation (2021)    Anion gap 10 5 - 15    Comment: Performed at [facility], [address], [place], [place] [address]  CBC     Status: Abnormal   Collection Time: 08/03/24  5:00 AM  Result Value Ref Range   WBC 10.2 4.0 - 10.5 K/uL   RBC 2.86 (L) 4.22 - 5.81 MIL/uL   Hemoglobin 7.0 (L) 13.0 - 17.0 g/dL   HCT 77.5 (L) 60.9 - 47.9 %   MCV 78.3 (L) 80.0 -  100.0 fL   MCH 24.5 (L) 26.0 - 34.0 pg   MCHC 31.3 30.0 - 36.0 g/dL   RDW 83.3 (H) 88.4 - 84.4 %   Platelets 394 150 - 400 K/uL   nRBC 0.0 0.0 - 0.2 %    Comment: Performed at [facility], [address] [place], [place] [address]  Glucose, capillary     Status: Abnormal   Collection Time: 08/03/24  8:14 AM  Result Value Ref Range   Glucose-Capillary 58 (L) 70 - 99 mg/dL    Comment: Glucose reference range applies only to samples taken after fasting for at least 8 hours.  Glucose, capillary     Status: None   Collection Time: 08/03/24  9:24 AM  Result Value Ref Range   Glucose-Capillary 87 70 - 99 mg/dL    Comment: Glucose reference range applies only to samples taken after fasting for at least 8 hours.  Glucose, capillary     Status: Abnormal   Collection  Time: 08/03/24 12:06 PM  Result Value Ref Range   Glucose-Capillary 141 (H) 70 - 99 mg/dL    Comment: Glucose reference range applies only to samples taken after fasting for at least 8 hours.  Glucose, capillary     Status: Abnormal   Collection Time: 08/03/24  5:00 PM  Result Value Ref Range   Glucose-Capillary 174 (H) 70 - 99 mg/dL    Comment: Glucose reference range applies only to samples taken after fasting for at least 8 hours.  Glucose, capillary     Status: Abnormal   Collection Time: 08/03/24  9:25 PM  Result Value Ref Range   Glucose-Capillary 140 (H) 70 - 99 mg/dL    Comment: Glucose reference range applies only to samples taken after fasting for at least 8 hours.  CBC with Differential/Platelet     Status: Abnormal   Collection Time: 08/04/24  1:59 AM  Result Value Ref Range   WBC 10.0 4.0 - 10.5 K/uL   RBC 3.27 (L) 4.22 - 5.81 MIL/uL   Hemoglobin 8.0 (L) 13.0 - 17.0 g/dL   HCT 73.9 (L) 60.9 - 47.9 %   MCV 79.5 (L) 80.0 - 100.0 fL   MCH 24.5 (L) 26.0 - 34.0 pg   MCHC 30.8 30.0 - 36.0 g/dL   RDW 82.9 (H) 88.4 - 84.4 %   Platelets 255 150 - 400 K/uL    Comment: SPECIMEN CHECKED FOR CLOTS REPEATED TO VERIFY    nRBC 0.0 0.0 - 0.2 %   Neutrophils Relative % 72 %   Neutro Abs 7.2 1.7 - 7.7 K/uL   Lymphocytes Relative 13 %   Lymphs Abs 1.3 0.7 - 4.0 K/uL   Monocytes Relative 8 %   Monocytes Absolute 0.8 0.1 - 1.0 K/uL   Eosinophils Relative 2 %   Eosinophils Absolute 0.2 0.0 - 0.5 K/uL   Basophils Relative 1 %   Basophils Absolute 0.1 0.0 - 0.1 K/uL   WBC Morphology See Note     Comment: Mild Left Shift (1-5% metas, occ myelo)   Smear Review Normal platelet morphology    Immature Granulocytes 4 %   Abs Immature Granulocytes 0.38 (H) 0.00 - 0.07 K/uL   Target Cells PRESENT     Comment: Performed at [facility], [address], [place], [place] [address]  Glucose, capillary     Status: Abnormal   Collection Time: 08/04/24  8:12 AM  Result Value Ref Range    Glucose-Capillary 117 (  H) 70 - 99 mg/dL    Comment: Glucose reference range applies only to samples taken after fasting for at least 8 hours.  Basic metabolic panel with GFR     Status: Abnormal   Collection Time: 08/04/24  9:05 AM  Result Value Ref Range   Sodium 143 135 - 145 mmol/L   Potassium 4.4 3.5 - 5.1 mmol/L   Chloride 118 (H) 98 - 111 mmol/L   CO2 16 (L) 22 - 32 mmol/L   Glucose, Bld 137 (H) 70 - 99 mg/dL    Comment: Glucose reference range applies only to samples taken after fasting for at least 8 hours.   BUN 59 (H) 8 - 23 mg/dL   Creatinine, Ser 8.17 (H) 0.61 - 1.24 mg/dL   Calcium  8.8 (L) 8.9 - 10.3 mg/dL   GFR, Estimated 38 (L) >60 mL/min    Comment: (NOTE) Calculated using the CKD-EPI Creatinine Equation (2021)    Anion gap 9 5 - 15    Comment: Performed at [facility], [address], [place], [place] [address]  Magnesium      Status: None   Collection Time: 08/04/24  9:05 AM  Result Value Ref Range   Magnesium  1.8 1.7 - 2.4 mg/dL    Comment: Performed at [facility], [address] [place], [place] [address]  Glucose, capillary     Status: Abnormal   Collection Time: 08/04/24 11:40 AM  Result Value Ref Range   Glucose-Capillary 127 (H) 70 - 99 mg/dL    Comment: Glucose reference range applies only to samples taken after fasting for at least 8 hours.   MR SACRUM SI JOINTS W WO CONTRAST Result Date: 08/02/2024 CLINICAL DATA:  Decubitus ulcer.  Evaluate for sacral osteomyelitis. EXAM: MRI PELVIS WITHOUT AND WITH CONTRAST TECHNIQUE: Multiplanar multisequence MR imaging of the pelvis was performed both before and after administration of intravenous contrast. CONTRAST:  8mL GADAVIST  GADOBUTROL  1 MMOL/ML IV SOLN COMPARISON:  Prostate MRI 03/31/2022.  Pelvic CT 11/20/2023. FINDINGS: Technical note: Despite efforts by the technologist and patient, mild motion artifact is present on today's exam and could not be eliminated. This reduces exam sensitivity and  specificity. Bones/Joint/Cartilage There are areas of soft tissue ulceration posterior to the lower sacrum and coccyx and inferior to the right ischial tuberosity, further described below. There is T2 hyperintensity and low level enhancement within the distal sacrum and coccyx, suspicious for early osteomyelitis. No definite cortical destruction identified on T1 weighted images. The upper sacrum and sacroiliac joints appear intact. No evidence of ischial osteomyelitis. Mild degenerative changes of both hips without evidence of joint effusion or osteomyelitis. Muscles and Tendons Generalized pelvic muscular atrophy and edema. There is asymmetric edema and mild enlargement of the left hip adductor muscles. There is heterogeneous low T1 and low T2 signal within the left ischiofemoral space. There is asymmetric left common hamstring tendinosis with partial tearing. Soft tissue As above, superficial soft tissue ulceration over the distal sacrum and coccyx with a probable small sinus tract extending to bone. No organized fluid collection is identified in this area. Additional decubitus ulcer and deep sinus tract extending to the right ischial tuberosity. Generalized decreased soft tissue enhancement surrounding the distal sacrum and coccyx, suspicious for devitalized soft tissue. There is heterogeneous T2 hyperintensity and enhancement within the presacral soft tissues, asymmetric to the right. Ill-defined right perirectal fluid collection demonstrates low-level peripheral enhancement and measures up to 5.0 x 1.7 cm on image 3 4/9, suspicious for  an intrapelvic abscess. Heterogeneous enlargement of the prostate gland again noted, similar to previous MRI. 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 There is generalized subcutaneous edema. IMPRESSION: 1. Soft tissue ulceration over the distal sacrum and coccyx with probable small sinus tract extending to bone. There is T2  hyperintensity and low level enhancement within the distal sacrum and coccyx, suspicious for early osteomyelitis. 2. Additional decubitus ulcer and deep sinus tract extending to the right ischial tuberosity. No evidence of right ischial osteomyelitis. 3. Ill-defined right perirectal fluid collection with low-level peripheral enhancement, suspicious for an intrapelvic abscess. Given the motion and small field of view limitations of this examination, this may be better evaluated with pelvic CT with contrast. 4. Generalized pelvic muscular atrophy and edema. Asymmetric edema and mild enlargement of the left hip adductor muscles, likely reactive. Indeterminate heterogeneous low signal in the left ischiofemoral space, potentially calcifications or gas. This too could be further evaluated with CT. 5. Heterogeneous enlargement of the prostate gland, similar to previous MRI. Electronically Signed   By: Elsie Perone M.D.   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  Neither of these wounds warrant surgical debridement in the OR.  There is some fibrinous/necrotic tissue that seems mild on the right side of the sacral wound.  This should improve with conservative treatment.  The rest of the wound is actually quite clean.  There is no evidence of infection in either wound.  Recommendations are made for BID WD dressing changes with the use of Medihoney daily along with the addition of PT hydrotherapy to clean and bedside debride the wound.  Ultimately, the daughter and I discussed nutrition and offloading pressure are keys to this.  Given his bedbound status and intermittent poor PO intake, we discussed that this wound may not heal and could worsen.  She understands.  I discussed my recommendations with the primary service as well.  No further surgical needs at this time.  He may follow up with his  current primary or wound care clinician that is already following him upon discharge.   I reviewed Consultant ID notes, hospitalist notes, last 24 h vitals and pain scores, last 48 h intake and output, last 24 h labs and trends, and last 24 h imaging results.  Burnard FORBES Banter, [facility] 08/04/2024, 2:55 PM Please see Amion for pager number during day hours 7:00am-4:30pm or 7:00am -11:30am on weekends

## 2024-08-04 NOTE — Consult Note (Addendum)
 WOC Nurse Consult Note: Surgical team following for assessment and plan of care for sacrum wound.   Reason for Consult: Consult requested for left heel.  Performed remotely after review of progress notes and photos in the EMR.   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  Present on admission: Yes, appearance is consistent with a pressure injury which has been present for a prolonged period of time.  It is best practice to leave dry stable eschar in place to heel wounds.   Prevalon boot to reduce pressure when patient is in bed.  Topical treatment orders provided for bedside nurses to perform as follows to protect from further injury: Paint left heel with Betadine Q day, then cover with foam dressing.  Change foam dressing Q 3 days or PRN soiling. Please re-consult if further assistance is needed.  Thank-you,  Stephane Fought MSN, RN, CWOCN, CWCN-AP, CNS Contact Mon-Fri 0700-1500: [phone]

## 2024-08-04 NOTE — Progress Notes (Signed)
 MEWS Progress Note  Patient Details Name: Gilbert Wilson MRN: [id number] DOB: 01-02-1946 Today's Date: 08/04/2024   MEWS Flowsheet Documentation:  Assess: MEWS Score Temp: 99.4 F (37.4 C) BP: 121/61 MAP (mmHg): 78 Pulse Rate: 80 ECG Heart Rate: 80 Resp: 19 Level of Consciousness: Alert SpO2: 96 % O2 Device: Room Air Assess: MEWS Score MEWS Temp: 0 MEWS Systolic: 0 MEWS Pulse: 0 MEWS RR: 0 MEWS LOC: 0 MEWS Score: 0 MEWS Score Color: Green Assess: SIRS CRITERIA SIRS Temperature : 0 SIRS Respirations : 0 SIRS Pulse: 0 SIRS WBC: 0 SIRS Score Sum : 0 SIRS Temperature : 1 SIRS Pulse: 0 SIRS Respirations : 0 SIRS WBC: 0 SIRS Score Sum : 1 Assess: if the MEWS score is Yellow or Red Were vital signs accurate and taken at a resting state?: Yes Does the patient meet 2 or more of the SIRS criteria?: No MEWS guidelines implemented : Yes, yellow Treat MEWS Interventions: Considered administering scheduled or prn medications/treatments as ordered Take Vital Signs Increase Vital Sign Frequency : Yellow: Q2hr x1, continue Q4hrs until patient remains green for 12hrs Escalate MEWS: Escalate: Yellow: Discuss with charge nurse and consider notifying provider and/or RRT        [name] 08/04/2024, 2:21 AM

## 2024-08-04 NOTE — Consult Note (Signed)
 [facility]                                                                                       Patient Identification: Patient Name: Gilbert Wilson MRN: [id number] Admit Date: 08/02/2024 11:04 AM Today's Date: 08/04/2024 Reason for consult: Perirectal abscess Requesting provider: Dr. 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 Please send aspirate for gram stain, aerobic and anaerobic cultures  - monitor CBC, CMP and CPK - discussed poor chances of healing wound given his bed bound status, poor nutrition   - universal/standard isolation precautions  Following     Rest of the management as per the primary team. Please call with questions or  concerns.  Thank you for the consult  __________________________________________________________________________________________________________ HPI and Hospital Course: 78 year old male with prior history of CVA with hemiparesis with bedbound status, severe dementia, type II DM, seizure, HTN, hepatic artery aneurysm, s/p liver and kidney transplant, BOO requiring I and O cath, recurrent UTI, DVT and A-fib no longer on AC, OA, sacral decubitus ulcer left heel pressure injury who was brought in by ED on 12/2 by PTAR for increasing lethargy, AMS, poor p.o. appetite, concern for worsening sacral wound, left heel pressure ulcer.  Was recently treated with Bactrim for presumed UTI 2 weeks prior to admit which was followed by diarrhea.  No known fever at home  At ED, patient mental status to his baseline, able to tell name Afebrile ( but later fevered) tachycardic Labs remarkable for WBC 12.5, hemoglobin 8.9, creatinine 1.98, CRP 17, albumin 2.0, RVP, FOBT negative Concern for infected sacral wound Started on vancomycin , ceftriaxone , IVF  12/2 blood cx 2/2 sets NGTD   IMPRESSION: 1. Soft tissue ulceration over the distal sacrum and coccyx with probable small sinus tract extending to bone. There is T2 hyperintensity and low level enhancement within the distal sacrum and coccyx, suspicious for early osteomyelitis. 2. Additional decubitus ulcer and deep sinus tract extending to the right ischial tuberosity.  No evidence of right ischial osteomyelitis. 3. Ill-defined right perirectal fluid collection with low-level peripheral enhancement, suspicious for an intrapelvic abscess. Given the motion and small field of view limitations of this examination, this may be better evaluated with pelvic CT with contrast. 4. Generalized pelvic muscular atrophy and edema. Asymmetric edema and mild enlargement of the left hip adductor muscles, likely reactive. Indeterminate heterogeneous low signal in the  left ischiofemoral space, potentially calcifications or gas. This too could be further evaluated with CT. 5. Heterogeneous enlargement of the prostate gland, similar to previous MRI.  Spoke with daughter at bedside, sacral wound developed sometime in April/May this year and never healed. 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   Hepatitis C    HTN (hypertension)    Kidney failure    Liver failure (HCC)    Seizure (HCC)    Tachycardia requiring ablation 2017   Type 2 diabetes mellitus treated without insulin  (HCC)    Past Surgical History:  Procedure Laterality Date   KIDNEY TRANSPLANT  2016   same time as liver   LIVER TRANSPLANT N/A 2016   SVT ABLATION  2017   Scheduled Meds:  Chlorhexidine  Gluconate Cloth  6 each Topical Q0600   cycloSPORINE   130 mg Oral BID   enoxaparin  (LOVENOX ) injection  30 mg Subcutaneous Q24H   finasteride   5 mg Oral QHS   insulin  aspart  0-5 Units Subcutaneous QHS   insulin  aspart  0-9 Units Subcutaneous TID WC   lactobacillus   Oral TID   levETIRAcetam   750 mg Oral BID   mycophenolate   180 mg Oral BID   tamsulosin   0.4 mg Oral Daily   Continuous Infusions:  sodium chloride  75 mL/hr at 08/04/24 9356   cefTRIAXone  (ROCEPHIN )  IV 2 g (08/03/24 1718)   vancomycin  1,000 mg (08/03/24 1807)   PRN Meds:.acetaminophen  **OR** acetaminophen , albuterol , bisacodyl , hydrALAZINE , LORazepam , polyethylene glycol, traMADol   Allergies  Allergen Reactions   Linezolid Other (See Comments)    Seizures   Levaquin [Levofloxacin] Itching   Lisinopril Cough   Social History   Socioeconomic History   Marital status: Married    Spouse name: Not on file   Number of children: 3   Years of education: Not on file   Highest education level: 12th grade  Occupational History   Not on file  Tobacco Use    Smoking status: Former    Current packs/day: 0.00    Types: Cigarettes    Quit date: 09/02/1983    Years since quitting: 40.9   Smokeless tobacco: Never  Vaping Use   Vaping status: Never Used  Substance and Sexual Activity   Alcohol use: Never   Drug use: Never   Sexual activity: Not Currently  Other Topics Concern   Not on file  Social History Narrative   Not on file   Social Drivers of Health   Financial Resource Strain: Not on file  Food Insecurity: No Food Insecurity (08/03/2024)   Hunger Vital Sign    Worried About Running Out of Food in the Last Year: Never true    Ran Out of Food in the Last Year: Never true  Transportation Needs: No Transportation Needs (08/03/2024)   PRAPARE - Administrator, Civil Service (Medical): No    Lack of Transportation (  Non-Medical): No  Physical Activity: Not on file  Stress: Not on file  Social Connections: Moderately Integrated (08/03/2024)   Social Connection and Isolation Panel    Frequency of Communication with Friends and Family: Once a week    Frequency of Social Gatherings with Friends and Family: Once a week    Attends Religious Services: 1 to 4 times per year    Active Member of [employer] or Organizations: No    Attends Engineer, Structural: 1 to 4 times per year    Marital Status: Married  Catering Manager Violence: Not At Risk (08/03/2024)   Humiliation, Afraid, Rape, and Kick questionnaire    Fear of Current or Ex-Partner: No    Emotionally Abused: No    Physically Abused: No    Sexually Abused: No   Family History  Problem Relation Age of Onset   Hypertension Mother    Diabetes Mother    Diabetes Father    Vitals BP (!) 137/53 (BP Location: Left Arm)   Pulse 75   Temp 98.2 F (36.8 C) (Oral)   Resp 16   Ht 6' 1 (1.854 m)   SpO2 100%   BMI 26.12 kg/m    Physical Exam Constitutional:  chronically ill appearing male lying in the bed     Comments: HEENT   Cardiovascular:     Rate and Rhythm:  Normal rate     Heart sounds: s1s2  Pulmonary:     Effort: Pulmonary effort is normal.     Comments: Normal breath sounds   Abdominal:     Palpations: Abdomen is soft.     Tenderness: non distended   Musculoskeletal:        General: No swelling or tenderness in peripheral joints   Skin:    Comments: no rashes  Pictures from yesterday reviewed        Left heel chronic unstageable pressure injury with dry eschar Unstageable sacral wound with some eschar, necrotic debris, no purulence, surrounding erythema  Neurological:     General: nods to some questions, bed bound   Pertinent Microbiology Results for orders placed or performed during the hospital encounter of 08/02/24  Resp panel by RT-PCR (RSV, Flu A&B, Covid) Anterior Nasal Swab     Status: None   Collection Time: 08/02/24 12:28 PM   Specimen: Anterior Nasal Swab  Result Value Ref Range Status   SARS Coronavirus 2 by RT PCR NEGATIVE NEGATIVE Final   Influenza A by PCR NEGATIVE NEGATIVE Final   Influenza B by PCR NEGATIVE NEGATIVE Final    Comment: (NOTE) The Xpert Xpress SARS-CoV-2/FLU/RSV plus assay is intended as an aid in the diagnosis of influenza from Nasopharyngeal swab specimens and should not be used as a sole basis for treatment. Nasal washings and aspirates are unacceptable for Xpert Xpress SARS-CoV-2/FLU/RSV testing.  Fact Sheet for Patients: bloggercourse.com  Fact Sheet for Healthcare Providers: seriousbroker.it  This test is not yet approved or cleared by the [place]  FDA and has been authorized for detection and/or diagnosis of SARS-CoV-2 by FDA under an Emergency Use Authorization (EUA). This EUA will remain in effect (meaning this test can be used) for the duration of the COVID-19 declaration under Section 564(b)(1) of the Act, 21 U.S.C. section 360bbb-3(b)(1), unless the authorization is terminated or revoked.     Resp Syncytial Virus  by PCR NEGATIVE NEGATIVE Final    Comment: (NOTE) Fact Sheet for Patients: bloggercourse.com  Fact Sheet for Healthcare Providers: seriousbroker.it  This test is  not yet approved or cleared by the [place]  FDA and has been authorized for detection and/or diagnosis of SARS-CoV-2 by FDA under an Emergency Use Authorization (EUA). This EUA will remain in effect (meaning this test can be used) for the duration of the COVID-19 declaration under Section 564(b)(1) of the Act, 21 U.S.C. section 360bbb-3(b)(1), unless the authorization is terminated or revoked.  Performed at [facility], [address] [place], [place] [address]   Blood culture (routine x 2)     Status: None (Preliminary result)   Collection Time: 08/02/24  3:00 PM   Specimen: BLOOD  Result Value Ref Range Status   Specimen Description BLOOD LEFT ANTECUBITAL  Final   Special Requests   Final    BOTTLES DRAWN AEROBIC AND ANAEROBIC Blood Culture results may not be optimal due to an inadequate volume of blood received in culture bottles   Culture   Final    NO GROWTH 2 DAYS Performed at [facility], [address], [place], [place] [address]    Report Status PENDING  Incomplete  Blood culture (routine x 2)     Status: None (Preliminary result)   Collection Time: 08/02/24  8:31 PM   Specimen: BLOOD LEFT HAND  Result Value Ref Range Status   Specimen Description BLOOD LEFT HAND  Final   Special Requests   Final    BOTTLES DRAWN AEROBIC AND ANAEROBIC Blood Culture results may not be optimal due to an inadequate volume of blood received in culture bottles   Culture   Final    NO GROWTH 2 DAYS Performed at [facility], [address], [place], [place] [address]    Report Status PENDING  Incomplete   Pertinent Lab seen by me:    Latest Ref Rng & Units 08/04/2024    1:59 AM 08/03/2024    5:00 AM 08/02/2024   12:28 PM  CBC  WBC 4.0 - 10.5 K/uL  10.0  10.2  12.5   Hemoglobin 13.0 - 17.0 g/dL 8.0  7.0  8.9   Hematocrit 39.0 - 52.0 % 26.0  22.4  29.2   Platelets 150 - 400 K/uL 255  394  473       Latest Ref Rng & Units 08/04/2024    9:05 AM 08/03/2024    5:00 AM 08/02/2024   12:28 PM  CMP  Glucose 70 - 99 mg/dL 862  62  871   BUN 8 - 23 mg/dL 59  63  73   Creatinine 0.61 - 1.24 mg/dL 8.17  8.20  8.01   Sodium 135 - 145 mmol/L 143  144  140   Potassium 3.5 - 5.1 mmol/L 4.4  4.2  4.2   Chloride 98 - 111 mmol/L 118  119  112   CO2 22 - 32 mmol/L 16  15  19    Calcium  8.9 - 10.3 mg/dL 8.8  8.9  9.6   Total Protein 6.5 - 8.1 g/dL   6.7   Total Bilirubin 0.0 - 1.2 mg/dL   0.6   Alkaline Phos 38 - 126 U/L   102   AST 15 - 41 U/L   15   ALT 0 - 44 U/L   17     Pertinent Imagings/Other Imagings Plain films and CT images have been personally visualized and interpreted; radiology reports have been reviewed. Decision making incorporated into the Impression / Recommendations.  MR SACRUM SI JOINTS W WO CONTRAST Result Date: 08/02/2024 CLINICAL  DATA:  Decubitus ulcer.  Evaluate for sacral osteomyelitis. EXAM: MRI PELVIS WITHOUT AND WITH CONTRAST TECHNIQUE: Multiplanar multisequence MR imaging of the pelvis was performed both before and after administration of intravenous contrast. CONTRAST:  8mL GADAVIST  GADOBUTROL  1 MMOL/ML IV SOLN COMPARISON:  Prostate MRI 03/31/2022.  Pelvic CT 11/20/2023. FINDINGS: Technical note: Despite efforts by the technologist and patient, mild motion artifact is present on today's exam and could not be eliminated. This reduces exam sensitivity and specificity. Bones/Joint/Cartilage There are areas of soft tissue ulceration posterior to the lower sacrum and coccyx and inferior to the right ischial tuberosity, further described below. There is T2 hyperintensity and low level enhancement within the distal sacrum and coccyx, suspicious for early osteomyelitis. No definite cortical destruction identified on T1 weighted  images. The upper sacrum and sacroiliac joints appear intact. No evidence of ischial osteomyelitis. Mild degenerative changes of both hips without evidence of joint effusion or osteomyelitis. Muscles and Tendons Generalized pelvic muscular atrophy and edema. There is asymmetric edema and mild enlargement of the left hip adductor muscles. There is heterogeneous low T1 and low T2 signal within the left ischiofemoral space. There is asymmetric left common hamstring tendinosis with partial tearing. Soft tissue As above, superficial soft tissue ulceration over the distal sacrum and coccyx with a probable small sinus tract extending to bone. No organized fluid collection is identified in this area. Additional decubitus ulcer and deep sinus tract extending to the right ischial tuberosity. Generalized decreased soft tissue enhancement surrounding the distal sacrum and coccyx, suspicious for devitalized soft tissue. There is heterogeneous T2 hyperintensity and enhancement within the presacral soft tissues, asymmetric to the right. Ill-defined right perirectal fluid collection demonstrates low-level peripheral enhancement and measures up to 5.0 x 1.7 cm on image 3 4/9, suspicious for an intrapelvic abscess. Heterogeneous enlargement of the prostate gland again noted, similar to previous MRI. 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 There is generalized subcutaneous edema. IMPRESSION: 1. Soft tissue ulceration over the distal sacrum and coccyx with probable small sinus tract extending to bone. There is T2 hyperintensity and low level enhancement within the distal sacrum and coccyx, suspicious for early osteomyelitis. 2. Additional decubitus ulcer and deep sinus tract extending to the right ischial tuberosity. No evidence of right ischial osteomyelitis. 3. Ill-defined right perirectal fluid collection with low-level peripheral enhancement, suspicious for an intrapelvic abscess.  Given the motion and small field of view limitations of this examination, this may be better evaluated with pelvic CT with contrast. 4. Generalized pelvic muscular atrophy and edema. Asymmetric edema and mild enlargement of the left hip adductor muscles, likely reactive. Indeterminate heterogeneous low signal in the left ischiofemoral space, potentially calcifications or gas. This too could be further evaluated with CT. 5. Heterogeneous enlargement of the prostate gland, similar to previous MRI. Electronically Signed   By: Elsie Perone M.D.   On: 08/02/2024 18:01   DG Foot Complete Left Result Date: 08/02/2024 CLINICAL DATA:  Evaluate for osteomyelitis. EXAM: DG FOOT COMPLETE 3+V*L* COMPARISON:  None Available. FINDINGS: Diffuse decreased bone mineralization. Moderate degenerative change of the first MTP joint and mild degenerative change at the first IP joint. No acute fracture or dislocation. Degenerative changes over the midfoot and hindfoot. Soft tissue wound over the superficial aspect of the inferior plantar soft tissues adjacent the posterior calcaneus. No significant bone destruction to suggest osteomyelitis. Small vessel atherosclerotic disease is present. IMPRESSION: 1. Soft tissue wound over the inferior plantar soft tissues  adjacent the posterior calcaneus. No significant bone destruction to suggest osteomyelitis. 2. Degenerative changes as described. Electronically Signed   By: Toribio Agreste M.D.   On: 08/02/2024 13:22   DG Chest Portable 1 View Result Date: 08/02/2024 CLINICAL DATA:  Dementia and diabetes.  Potential UTI. EXAM: PORTABLE CHEST 1 VIEW COMPARISON:  01/04/2024 FINDINGS: Lungs are hypoinflated without lobar consolidation, effusion or pneumothorax. Minimal stable prominence of the central pulmonary bronchovascular markings likely due to the degree of hypoinflation. Cardiomediastinal silhouette and remainder of the exam is unchanged. IMPRESSION: Hypoinflation without acute  cardiopulmonary disease. Electronically Signed   By: Toribio Agreste M.D.   On: 08/02/2024 13:10   I spent 85 minutes involved in face-to-face and non-face-to-face activities for this patient on the day of the visit. Professional time spent includes the following activities: Preparing to see the patient (review of tests), Obtaining and reviewing separately obtained history (EDN, surgery note, hospitalist progress note), Performing a medically appropriate examination and evaluation , Ordering medications/labs, referring and communicating with other health care professionals, Documenting clinical information in the EMR, Independently interpreting results (not separately reported), Communicating results to the family, Counseling and educating the family and Care coordination (not separately reported).  Electronically signed by:   Plan d/w requesting provider as well as ID pharm D  Of note, portions of this note may have been created with voice recognition software. While this note has been edited for accuracy, occasional wrong-word or 'sound-a-like' substitutions may have occurred due to the inherent limitations of voice recognition software.   Annalee Orem, MD Infectious Disease Physician [facility] Pager: [phone]

## 2024-08-04 NOTE — Progress Notes (Signed)
 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 male  with past medical history of dementia, prior CVA with right side hemiplegia, liver failure s/p transplant, ESRD s/p renal transplant, seizure disorder admitted on 08/02/2024 with lethargy and leukocytosis.   Per H&P on 12/2 by Dahal MD, patient has been physically and cognitively been declining after discussion with family. Patient has had more diarrhea 2/2 antibiotics for recurrent UTIs resulting in contamination of a sacral decubitus ulcer. Sources of infection include urine and sacral ulcer given history of recurrent UTI as well as increased diarrhea which increases likelihood of contamination of wound.    CXR 08/02/2024 demonstrates hypoinflation without acute findings concerning for pneumonia. X-ray 08/02/2024 of left foot with heel pressure injury does not suggest osteomyelitis. MRI of pelvis on 08/02/2024 demonstrated small sinus tract extending to sacrum and coccyx that is suspicious for osteomyelitis as well as intrapelvic abscess. Media review of sacral ulcer and left heel pressure injury on 08/02/2024 shows significant deterioration compared to prior images taken on 01/05/2024. Leukocytosis without fever but with possible sources of infection including sacral ulcer  and intrapelvic abscess.    Palliative medicine consulted for goals of care conversation.   SUMMARY OF RECOMMENDATIONS Full code, full scope, family opted to pursue IR drainage of perirectal abscess Continue current management Follow up 12/5 and discuss further about [facility] palliative support and advanced directives Symptom management as below  Symptom Management:  Dilaudid  1-2 mg PO PRN for pain  Code Status: Full Code  Prognosis: Unable to determine  Discharge Planning: To Be Determined  Discussed with: [name] MD 08/04/2024 about adding dilaudid  for pain management and discontinuing tramadol  due to concern for seizure threshold given seizure history.    Subjective:   Subjective: Chart Reviewed. Updates received. Patient Assessed. Created space and opportunity for patient  and family to explore thoughts and feelings regarding current medical situation.  Visited patient with daughter [name]) bedside in conjunction with Dr. Oneita of PMT.   Today's Discussion:  Per progress note on 08/03/2024 by [name] MD - patient is not a good candidate for sacral debridement as he would not be able to heal adequately from it.   Per surgery consult note on 08/04/2024 by Tammy PA and Cornett MD - no surgical interventions needed at this time for sacral ulcer but there is concern that given intermittent poor PO intake the wound may not heal and could continue to deteriorate.   Daughter shared that her conversation with primary hospitalist included three pathways. The pathways discussed were to pursue aggressive antibiotic regimen requiring a PICC line, PO antibiotic regimen, and finally considering comfort measures. Family is weighing options between the antibiotic regimen but inquired about pain management when the patient  is outside of the hospital. Shared that given the patient's history of renal and hepatic transplant, will avoid NSAIDs or morphine and consider dilaudid  or oxycodone instead.  Family shared that patient was on tramadol  prior but it was discontinued due to concern for lowering seizure threshold given patient's history of seizures and being on long term anti-epileptic drugs for over 10 years. Family agreeable to initiating PRN dilaudid  for pain management.   Reiterated that the medical team is concerned for overall prognosis given concern for cognition, nutrition, and functionality that has resulted in the patient's current admission. Overall the options are not great but family is very aware that this is most likely the beginning of recurrent hospitalization cycle. Daughter inquired about how long to expect patient to deteriorate again and need hospitalization and shared that in most cases it would not be unreasonable to expect another deterioration in another month. Encouraged family to begin considering at what point will continued aggressive medical interventions cause more harm than help as well as considering at what point is the goal of keeping patient comfortable at home for a long enough period no longer able to be met.   Review of Systems  Unable to perform ROS  Objective:   Primary Diagnoses: Present on Admission:  Sacral ulcer (HCC)  Essential hypertension  Dementia (HCC)  Atrial fibrillation (HCC)  Protein-calorie malnutrition, severe  Bladder outlet obstruction  Vital Signs:  BP (!) 137/53 (BP Location: Left Arm)   Pulse 75   Temp 98.2 F (36.8 C) (Oral)   Resp 16   Ht 6' 1 (1.854 m)   SpO2 100%   BMI 26.12 kg/m   Physical Exam Constitutional:      Comments: Arouses to voice but does not respond.  HENT:     Head: Normocephalic.     Nose: Nose normal.     Mouth/Throat:     Mouth: Mucous membranes are dry.  Eyes:     Extraocular Movements: Extraocular movements intact.  Cardiovascular:     Rate and Rhythm: Normal rate.  Pulmonary:     Effort: Pulmonary effort is normal.    Palliative Assessment/Data: 30%   Existing Vynca/ACP  Documentation: Reported HCPOA and AD, but not present, plan to discuss with daughter  Thank you for allowing us  to participate in the care of Gilbert Wilson PMT will continue to support holistically.  I personally spent a total of 50 minutes in the care of the patient today including preparing to see the patient, getting/reviewing separately obtained history, performing a medically appropriate exam/evaluation, counseling and educating, placing orders, referring and communicating with other health care professionals, and documenting clinical information in the EHR.  Faith Branan E Kaison Mcparland, PA-C  Palliative Medicine Team  Team Phone # [phone] (Nights/Weekends) 08/04/2024 3:30 PM

## 2024-08-04 NOTE — Progress Notes (Signed)
 Wound care for left heel and sacrum completed. Patient tolerated well. Moderate amount of drainage from sacrum with scant amount of drainage on left heel. Patient denies pain during and after dressing change. All needs met at this time. Bed in lowest position and call light within reach.

## 2024-08-04 NOTE — Progress Notes (Signed)
 Progress Note    Gilbert Wilson   FMW:[id number]  DOB: [birth year]  DOA: 08/02/2024     2 PCP: [name]  Initial CC: Lethargy  Hospital Course: Gilbert Wilson is a 78 y.o. male with PMH significant for severe dementia, h/o stroke with right hemiparesis bedbound status for about the past 3 years, total care dependent, h/o renal and liver transplant, seizure disorder, bladder outlet obstruction requiring in and out cath 4 times a day, frequent UTI, DVT and A-fib no longer on AC, osteoarthritis, sacral decubitus and left ankle pressure skin injury. Was hospitalized in April 2025 for 2 weeks for CHF exacerbation and again in May for flareup of left knee osteoarthritis.  He is cared for at home by his wife and daughter. Since about April 2025 he has had persistent and progressing sacral ulcer along with left heel ulcer.   Family endorsed on admission that patient has been noticeably progressively declining both physically and mentally. He has difficulty endorsing pain but has been noted to have pain involving his sacrum from a pressure ulcer. Recent courses of antibiotics for UTI have caused diarrhea which has further created difficulty managing his sacral wound.  Due to worsening lethargy, he was brought to the ER for further evaluation. Workup was concerning for sepsis and he was admitted for more workup.  Interval History:  No events overnight.  Daughter present bedside this morning.  Patient's spouse also on the phone.  Plan for now remains being aggressive.  Therefore, CT ordered per IR recommendations to further evaluate abscess.  General surgery, ID, wound care also consulted.  Assessment and Plan: * Sacral ulcer (HCC) - leukocytosis main variable; he was afebrile, overall non-toxic appearing but has persistent sacral ulcer and worsening lethargy on admission - MRI sacrum shows multiple findings: Soft tissue ulceration over distal sacrum and coccyx with probable  small sinus tract extending to bone suspicious for early OM; additional decubitus ulcer and deep sinus tract extending to the right ischial tuberosity - not a surgical candidate given poor functional status, nutritional status, bedbound, no chance of healing wound - continue local wound care - continue abx for now - Evaluated by general surgery also on 08/04/2024, appreciate recommendations  Perirectal abscess - MRI sacrum also shows ill-defined right perirectal fluid collection suspicious for intrapelvic abscess - IR consulted to evaluate if abscess able to be drained - CT pelvis ordered per IR recommendations -For now continuing antibiotics -ID consulted  Goals of care, counseling/discussion - Patient has had progressive decline over the past several months.  Seemingly since April 2025 he has been unable to heal sacral ulcer and has had recurrent infections. -Agree with palliative care consult on admission. -For now continuing antibiotics.  Daughter and mother have discussed further and for now wishing to remain aggressive for this hospitalization  Chronic ulcer of left heel (HCC) - Unstageable - chronic eschar appearance; no surgical needs nor candidacy at this time - continue wound care dressings and pressure offloading  - Wound care also consulted  History of CVA with residual deficit - Chronic left hemiparesis and bedbound at baseline -Fully dependent on all ADLs   Liver transplant recipient Gilbert Wilson) - Continue cyclosporine  and mycophenolate   History of renal transplant - Continue cyclosporine  and mycophenolate   Chronic kidney disease, stage 3b (HCC) - patient has history of CKD3b. 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  Minimally interactive  Atrial fibrillation (HCC) - Hold Eliquis  for now.  Rate controlled  Essential hypertension - As needed medications for now  Type 2 diabetes mellitus (HCC) - Continue SSI and CBG monitoring   Antimicrobials: Rocephin  12/2 >> current  Vanc 12/2 >> current   DVT prophylaxis:  enoxaparin  (LOVENOX ) injection 30 mg Start: 08/02/24 1700   Code Status:   Code Status: Full Code  Mobility Assessment (Last 72 Hours)     Mobility Assessment     Row Name 08/04/24 1000 08/03/24 2026 08/03/24 1338 08/03/24 0226 08/02/24 2013   Does the patient have exclusion criteria? Yes- Bedfast (Level 1) - Select exclusion criteria in next row Yes- Bedfast (Level 1) - Select exclusion criteria in next row Yes- Bedfast (Level 1) - Select exclusion criteria in next row Yes- Bedfast (Level 1) - Select exclusion criteria in next row Yes- Bedfast (Level 1) - Select exclusion criteria in next row   Mobility Assessment Exclusion Criteria No exclusion criteria present, perform mobility assessment -- No exclusion criteria present, perform mobility assessment No exclusion criteria present, perform mobility assessment No exclusion criteria present, perform mobility assessment   What is the highest level of mobility based on the mobility assessment? Level 2 (Chairfast) - Balance while sitting on edge of bed and cannot stand Level 2 (Chairfast) - Balance while sitting on edge of bed and cannot stand Level 2 (Chairfast) - Balance while sitting on edge of bed and cannot stand Level 2 (Chairfast) - Balance while sitting on edge of bed and cannot stand Level 2 (Chairfast) - Balance while sitting on edge of bed and cannot stand   Is the above level different from baseline mobility prior to current illness? Yes - Recommend PT order Yes - Recommend PT order Yes - Recommend PT  order Yes - Recommend PT order Yes - Recommend PT order      Diet: Diet Orders (From admission, onward)     Start     Ordered   08/05/24 0001  Diet NPO time specified Except for: Sips with Meds  Diet effective midnight       Comments: Possible procedure in IR 12/5  Question:  Except for  Answer:  Noralyn with Meds   08/04/24 1730   08/04/24 1147  DIET - DYS 1 Room service appropriate? Yes; Fluid consistency: Nectar Thick  Diet effective now       Question Answer Comment  Room service appropriate? Yes   Fluid consistency: Nectar Thick      08/04/24 1146            Barriers to discharge: none  Disposition Plan:  TBD HH orders placed: TBD Status is: Inpt  Objective: Blood pressure (!) 137/53, pulse 75, temperature 98.2 F (36.8 C), temperature source Oral, resp. rate 16, height 6' 1 (1.854 m), SpO2 100%.  Examination:  Physical Exam Constitutional:      Comments: Chronically ill-appearing elderly gentleman lying in bed in no distress  HENT:     Head: Normocephalic and atraumatic.     Mouth/Throat:     Mouth: Mucous membranes are dry.  Eyes:     Extraocular Movements: Extraocular movements intact.  Cardiovascular:     Rate and Rhythm: Normal rate and regular  rhythm.     Heart sounds: Normal heart sounds.  Pulmonary:     Effort: Pulmonary effort is normal. No respiratory distress.     Breath sounds: Normal breath sounds. No wheezing.  Abdominal:     General: Bowel sounds are normal. There is no distension.     Palpations: Abdomen is soft.     Tenderness: There is no abdominal tenderness.  Musculoskeletal:        General: Swelling (Generalized edema in all 4 extremities) present.     Cervical back: Normal range of motion and neck supple.     Right lower leg: Edema (2+) present.     Left lower leg: Edema (2+) present.     Comments: Left foot wrapped in dressing  Skin:    General: Skin is warm and dry.  Neurological:     Comments: Nods head appropriately to  questions.  Able to say a few words.      Consultants:  ID General surgery  Procedures:    Data Reviewed: Results for orders placed or performed during the hospital encounter of 08/02/24 (from the past 24 hours)  Glucose, capillary     Status: Abnormal   Collection Time: 08/03/24  9:25 PM  Result Value Ref Range   Glucose-Capillary 140 (H) 70 - 99 mg/dL  CBC with Differential/Platelet     Status: Abnormal   Collection Time: 08/04/24  1:59 AM  Result Value Ref Range   WBC 10.0 4.0 - 10.5 K/uL   RBC 3.27 (L) 4.22 - 5.81 MIL/uL   Hemoglobin 8.0 (L) 13.0 - 17.0 g/dL   HCT 73.9 (L) 60.9 - 47.9 %   MCV 79.5 (L) 80.0 - 100.0 fL   MCH 24.5 (L) 26.0 - 34.0 pg   MCHC 30.8 30.0 - 36.0 g/dL   RDW 82.9 (H) 88.4 - 84.4 %   Platelets 255 150 - 400 K/uL   nRBC 0.0 0.0 - 0.2 %   Neutrophils Relative % 72 %   Neutro Abs 7.2 1.7 - 7.7 K/uL   Lymphocytes Relative 13 %   Lymphs Abs 1.3 0.7 - 4.0 K/uL   Monocytes Relative 8 %   Monocytes Absolute 0.8 0.1 - 1.0 K/uL   Eosinophils Relative 2 %   Eosinophils Absolute 0.2 0.0 - 0.5 K/uL   Basophils Relative 1 %   Basophils Absolute 0.1 0.0 - 0.1 K/uL   WBC Morphology See Note    Smear Review Normal platelet morphology    Immature Granulocytes 4 %   Abs Immature Granulocytes 0.38 (H) 0.00 - 0.07 K/uL   Target Cells PRESENT   Glucose, capillary     Status: Abnormal   Collection Time: 08/04/24  8:12 AM  Result Value Ref Range   Glucose-Capillary 117 (H) 70 - 99 mg/dL  Basic metabolic panel with GFR     Status: Abnormal   Collection Time: 08/04/24  9:05 AM  Result Value Ref Range   Sodium 143 135 - 145 mmol/L   Potassium 4.4 3.5 - 5.1 mmol/L   Chloride 118 (H) 98 - 111 mmol/L   CO2 16 (L) 22 - 32 mmol/L   Glucose, Bld 137 (H) 70 - 99 mg/dL   BUN 59 (H) 8 - 23 mg/dL   Creatinine, Ser 8.17 (H) 0.61 - 1.24 mg/dL   Calcium  8.8 (L) 8.9 - 10.3 mg/dL   GFR, Estimated 38 (L) >60 mL/min   Anion gap 9 5 - 15  Magnesium      Status: None  Collection Time: 08/04/24  9:05 AM  Result Value Ref Range   Magnesium  1.8 1.7 - 2.4 mg/dL  Glucose, capillary     Status: Abnormal   Collection Time: 08/04/24 11:40 AM  Result Value Ref Range   Glucose-Capillary 127 (H) 70 - 99 mg/dL    I have reviewed pertinent nursing notes, vitals, labs, and images as necessary. I have ordered labwork to follow up on as indicated.  I have reviewed the last notes from staff over past 24 hours. I have discussed patient's care plan and test results with nursing staff, CM/SW, and other staff as appropriate.  Old records reviewed in assessment of this patient  Time spent: Greater than 50% of the 55 minute visit was spent in counseling/coordination of care for the patient as laid out in the A&P.   LOS: 2 days   Alm Apo, MD [facility] 08/04/2024, 5:39 PM

## 2024-08-04 NOTE — Progress Notes (Signed)
 MD [name] advised ok to resume diet and allow patient to eat. Diet changed back to Dys 1.

## 2024-08-04 NOTE — Progress Notes (Signed)
 Pharmacy Antibiotic Note  Gilbert Wilson is a 78 y.o. male admitted on 08/02/2024 with decubitus ulcers with early osteomyelitis and abscess.  Pharmacy has been consulted to change Vancomycin  to Daptomycin  dosing.   Plan: D/c Vancomycin  Daptomycin  700 mg (8 mg/kg) daily - starting 12/5  Weekly CK Will f/u renal function and micro data  Height: 6' 1 (185.4 cm) IBW/kg (Calculated) : 79.9  Temp (24hrs), Avg:99.5 F (37.5 C), Min:98.2 F (36.8 C), Max:102.1 F (38.9 C)  Recent Labs  Lab 08/02/24 1228 08/03/24 0500 08/04/24 0159 08/04/24 0905  WBC 12.5* 10.2 10.0  --   CREATININE 1.98* 1.79*  --  1.82*    CrCl cannot be calculated (Unknown ideal weight.).    Allergies  Allergen Reactions   Linezolid Other (See Comments)    Seizures   Levaquin [Levofloxacin] Itching   Lisinopril Cough    Antimicrobials this admission: CTX 12/2 >>  Vanc 12/2>> 12/4 Dapto 12/5>>  Microbiology results: 12/2 BCx:   Thank you for allowing pharmacy to be a part of this patient's care.  Vito Ralph, PharmD, BCPS Please see amion for complete clinical pharmacist phone list 08/04/2024 6:49 PM

## 2024-08-04 NOTE — Evaluation (Signed)
 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 PMH: severe dementia, LCVA, bedbound x3 years, renal and liver transplant, seizures, bladder outlet obstruction, recurrent UTI, DVT, AFib, OA, sacral decubitus, left ankle pressure injury, CHF, HepC, HTN, DM.    Assessment / Plan / Recommendation  Clinical Impression  Pt presents with long standing history of dementia and dysphagia. Pt's daughter at bedside throughout evaluation, and reports tolerance of minced or pureed solids (based on changes in mentation) and nectar thick liquids since MBS in January 2025. He exhibits oral holding intermittently at home, but appears to be tolerating that diet per daughter.   Suction was set up at bedside to facilitate thorough oral care. Pt was awake and alert, able to follow most 1-step directions with multimodal cues. Oral care completed with assistance of pt's daughter. Upper and lower dentures placed, and noted to be slightly ill fitting (no adhesive available).   Pt accepted trials of ice chips, nectar thick liquid, puree, and solid textures. Extended oral prep and mild oral residue of solid  observed. No overt s/s aspiration across presentations.   Recommend puree diet with nectar thick liquids at this time, primarily for energy conservation. Anticipate advancing solid textures with improvement in strength and endurance. Pt's daughter prefers to continue on nectar thick liquids, as pt occasionally takes large boluses. She is in agreement with current recommendations. Safe swallow precautions posted at [facility] and reviewed with pt's daughter. ST will follow to assess diet tolerance, readiness to advance, and continue family education to maximize safety. RN/MD informed.  SLP Visit Diagnosis: Dysphagia, unspecified (R13.10)    Aspiration Risk  Moderate aspiration risk;Mild aspiration risk    Diet Recommendation Nectar-thick liquid;Dysphagia 1 (Puree)    Liquid Administration via: Straw Supervision: Full supervision/cueing for compensatory strategies Compensations: Minimize environmental distractions;Slow rate;Small sips/bites Postural Changes: Seated upright at 90 degrees;Remain upright for at least 30 minutes after po intake    Other Recommendations Oral Care Recommendations: Oral care BID;Staff/trained caregiver to provide oral care Caregiver Recommendations: Have oral suction available (to facilitate thorough oral care)     Swallow Evaluation Recommendations Caregiver Recommendations: Have oral suction available (to facilitate thorough oral care)   Assistance Recommended at Discharge  Total assist  Functional Status Assessment Patient has not had a recent decline in their functional status  Frequency and Duration min 1 x/week  2 weeks       Prognosis Prognosis for improved oropharyngeal function: Fair Barriers to Reach Goals: Cognitive deficits;Severity of deficits;Time post onset      Swallow Study   General Date of Onset: 08/02/24  HPI: Gilbert Wilson admitted 08/02/24 with AMS. PMH: severe dementia, LCVA, bedbound x3 years, renal and liver transplant, seizures, bladder outlet  obstruction, recurrent UTI, DVT, AFib, OA, sacral decubitus, left ankle pressure injury, CHF, HepC, HTN, DM. Type of Study: Bedside Swallow Evaluation Previous Swallow Assessment: BSE 12/01/23: D2/NTL, questioned ability to meet needs PO. Palliative discussed. CXR: no acute cardiopulmonary disease. Diet Prior to this Study: NPO Temperature Spikes Noted: No Respiratory Status: Room air History of Recent Intubation: No Behavior/Cognition: Alert;Cooperative;Pleasant mood Oral Cavity Assessment: Within Functional Limits Oral Care Completed by SLP: Yes (suction set up) Oral Cavity - Dentition: Dentures, bottom;Dentures, top Vision: Functional for self-feeding Self-Feeding Abilities: Total assist Patient Positioning: Upright in bed Baseline Vocal Quality: Breathy;Low vocal intensity Volitional Cough: Weak Volitional Swallow: Able to elicit    Oral/Motor/Sensory Function Overall Oral Motor/Sensory Function: Generalized oral weakness   Ice Chips Ice chips: Within functional limits Presentation: Spoon   Thin Liquid Thin Liquid: Not tested    Nectar Thick Nectar Thick Liquid: Within functional limits Presentation: Straw   Honey Thick     Puree Puree: Within functional limits Presentation: Spoon   Solid     Solid: Impaired Oral Phase Impairments: Impaired mastication (ill fitting dentures) Oral Phase Functional Implications: Prolonged oral transit;Oral residue     Rudolf Blizard B. Dory, MSP, CCC-SLP Speech Language Pathologist Office: [phone]  [name] 08/04/2024,10:47 AM

## 2024-08-04 NOTE — Plan of Care (Signed)
  Problem: Nutritional: Goal: Maintenance of adequate nutrition will improve Outcome: Progressing   Problem: Nutrition: Goal: Adequate nutrition will be maintained Outcome: Progressing

## 2024-08-04 NOTE — Progress Notes (Signed)
 Patient more alert now. Vitals have improved. Continues to have higher respirations but this is normal per daughter.  Temp 98.4 Oral BP 146/58 HR 71 RR 20 02Sats 98% on RA

## 2024-08-05 DIAGNOSIS — D649 Anemia, unspecified: Secondary | ICD-10-CM | POA: Diagnosis not present

## 2024-08-05 DIAGNOSIS — M4628 Osteomyelitis of vertebra, sacral and sacrococcygeal region: Secondary | ICD-10-CM

## 2024-08-05 DIAGNOSIS — Z7189 Other specified counseling: Secondary | ICD-10-CM

## 2024-08-05 DIAGNOSIS — L89319 Pressure ulcer of right buttock, unspecified stage: Secondary | ICD-10-CM

## 2024-08-05 DIAGNOSIS — L89159 Pressure ulcer of sacral region, unspecified stage: Secondary | ICD-10-CM | POA: Diagnosis not present

## 2024-08-05 LAB — BASIC METABOLIC PANEL WITH GFR
Anion gap: 9 (ref 5–15)
BUN: 53 mg/dL — ABNORMAL HIGH (ref 8–23)
CO2: 15 mmol/L — ABNORMAL LOW (ref 22–32)
Calcium: 9.1 mg/dL (ref 8.9–10.3)
Chloride: 124 mmol/L — ABNORMAL HIGH (ref 98–111)
Creatinine, Ser: 1.78 mg/dL — ABNORMAL HIGH (ref 0.61–1.24)
GFR, Estimated: 39 mL/min — ABNORMAL LOW (ref >60)
Glucose, Bld: 142 mg/dL — ABNORMAL HIGH (ref 70–99)
Potassium: 4.2 mmol/L (ref 3.5–5.1)
Sodium: 148 mmol/L — ABNORMAL HIGH (ref 135–145)

## 2024-08-05 LAB — CBC WITH DIFFERENTIAL/PLATELET
Abs Immature Granulocytes: 0.11 K/uL — ABNORMAL HIGH (ref 0.00–0.07)
Basophils Absolute: 0.1 K/uL (ref 0.0–0.1)
Basophils Relative: 1 %
Eosinophils Absolute: 0.1 K/uL (ref 0.0–0.5)
Eosinophils Relative: 1 %
HCT: 22.8 % — ABNORMAL LOW (ref 39.0–52.0)
Hemoglobin: 6.9 g/dL — CL (ref 13.0–17.0)
Immature Granulocytes: 1 %
Lymphocytes Relative: 11 %
Lymphs Abs: 1 K/uL (ref 0.7–4.0)
MCH: 24.2 pg — ABNORMAL LOW (ref 26.0–34.0)
MCHC: 30.3 g/dL (ref 30.0–36.0)
MCV: 80 fL (ref 80.0–100.0)
Monocytes Absolute: 1 K/uL (ref 0.1–1.0)
Monocytes Relative: 10 %
Neutro Abs: 7.5 K/uL (ref 1.7–7.7)
Neutrophils Relative %: 76 %
Platelets: 416 K/uL — ABNORMAL HIGH (ref 150–400)
RBC: 2.85 MIL/uL — ABNORMAL LOW (ref 4.22–5.81)
RDW: 16.8 % — ABNORMAL HIGH (ref 11.5–15.5)
WBC: 9.8 K/uL (ref 4.0–10.5)
nRBC: 0 % (ref 0.0–0.2)

## 2024-08-05 LAB — CK: Total CK: 26 U/L — ABNORMAL LOW (ref 49–397)

## 2024-08-05 LAB — GLUCOSE, CAPILLARY
Glucose-Capillary: 129 mg/dL — ABNORMAL HIGH (ref 70–99)
Glucose-Capillary: 115 mg/dL — ABNORMAL HIGH (ref 70–99)
Glucose-Capillary: 149 mg/dL — ABNORMAL HIGH (ref 70–99)
Glucose-Capillary: 140 mg/dL — ABNORMAL HIGH (ref 70–99)

## 2024-08-05 LAB — ABO/RH: ABO/RH(D): O POS

## 2024-08-05 LAB — PREPARE RBC (CROSSMATCH)

## 2024-08-05 LAB — MAGNESIUM: Magnesium: 1.9 mg/dL (ref 1.7–2.4)

## 2024-08-05 MED ORDER — SODIUM CHLORIDE 0.9% IV SOLUTION: Freq: Once | INTRAVENOUS | Status: AC

## 2024-08-05 MED ORDER — MIRTAZAPINE 15 MG PO TABS
7.5000 mg | ORAL_TABLET | Freq: Every day | ORAL | Status: DC
  Administered 2024-08-05 – 2024-08-12 (×8): 7.5 mg via ORAL
  Filled 2024-08-05 (×8): qty 1

## 2024-08-05 MED ORDER — DARBEPOETIN ALFA 60 MCG/0.3ML IJ SOSY
60.0000 ug | PREFILLED_SYRINGE | INTRAMUSCULAR | Status: DC
  Administered 2024-08-05 – 2024-08-12 (×2): 60 ug via SUBCUTANEOUS
  Filled 2024-08-05 (×2): qty 0.3

## 2024-08-05 NOTE — Progress Notes (Signed)
 Interventional Radiology Brief Note:  Gilbert Wilson is a 78 year old male with PMH significant for severe dementia and stoke, bed bound status with chronic sacral ulcers admitted with lethargy.  MR Pelvis obtained 12/3 showed concern for poorly defined, but potential fluid collection in the perirectal space.  IR consulted for aspiration/drain.  Mugweru recommended a CT Pelv yesterday which has been completed.  This has been reviewed by Dr. Jenna who agrees there is no drainable pocket of fluid.   Medical team made aware.   Reiley Bertagnolli, MS RD PA-C 9:15 AM

## 2024-08-05 NOTE — TOC Initial Note (Addendum)
 Transition of Care (TOC) - Initial/Assessment Note   Spoke to patient's wife Gilbert Wilson at bedside. PAtient from home with wife and daughter.   Patient active with [facility] for [facility]. Confirmed with Channing with [facility] . Wife would like to continue services. Asked MD to sign order.   Patient will need ambulance transport home. Confirmed address.   Patient has wheelchair, hoyer lift, hospital bed , air mattress and ramps at home.   Discussed outpatient palliative care . At present wife does not want NCM to arrange same. Patient has NP, SW from [place] checking on him.   Anticipated discharge date is Monday 08/08/24 Patient Details  Name: Gilbert Wilson MRN: [id number] Date of Birth: 05/21/1946  Transition of Care [facility]) CM/SW Contact:    Stephane Powell Jansky, RN Phone Number: 08/05/2024, 3:12 PM  Clinical Narrative:                   Expected Discharge Plan: Home w Home Health Services Barriers to Discharge: Continued Medical Work up   Patient Goals and CMS Choice Patient states their goals for this hospitalization and ongoing recovery are:: wife to return home CMS Medicare.gov Compare Post Acute Care list provided to::  (wife) Choice offered to / list presented to : Spouse      Expected Discharge Plan and Services   Discharge Planning Services: CM Consult Post Acute Care Choice: Home Health Living arrangements for the past 2 months: Single Family Home                 DME Arranged: N/A DME Agency: NA       HH Arranged: RN HH Agency: [facility] Date HH Agency Contacted: 08/05/24 Time HH Agency Contacted: 1511 Representative spoke with at [facility] Agency: Channing  Prior Living Arrangements/Services Living arrangements for the past 2 months: Single Family Home Lives with:: Adult Children, Spouse   Do you feel safe going back to the place where you live?:  (wife said yes)          Current home services: DME    Activities of Daily Living   ADL  Screening (condition at time of admission) Independently performs ADLs?: No Does the patient have a NEW difficulty with bathing/dressing/toileting/self-feeding that is expected to last >3 days?: No Does the patient have a NEW difficulty with getting in/out of bed, walking, or climbing stairs that is expected to last >3 days?: No Does the patient have a NEW difficulty with communication that is expected to last >3 days?: No Is the patient deaf or have difficulty hearing?: Yes Does the patient have difficulty seeing, even when wearing glasses/contacts?: No Does the patient have difficulty concentrating, remembering, or making decisions?: Yes  Permission Sought/Granted   Permission granted to share information with : Yes, Verbal Permission Granted              Emotional Assessment              Admission diagnosis:  ESR raised [R70.0] Disorientation [R41.0] SIRS (systemic inflammatory response syndrome) (HCC) [R65.10] AKI (acute kidney injury) [N17.9] Patient Active Problem List   Diagnosis Date Noted   Perirectal abscess 08/03/2024   Chronic ulcer of left heel (HCC) 08/03/2024   Chronic kidney disease, stage 3b (HCC) 08/03/2024   Goals of care, counseling/discussion 08/03/2024   Sacral ulcer (HCC) 08/02/2024   Sacral decubitus ulcer, stage II (HCC) 01/06/2024   Pressure injury of left heel, unstageable (HCC) 01/06/2024   Arthritis 01/04/2024  Bladder outlet obstruction 01/04/2024   Hypercalcemia 01/04/2024   Aspiration into airway 01/04/2024   Protein-calorie malnutrition, severe 11/30/2023   CAP (community acquired pneumonia) 11/21/2023   Acute on chronic diastolic CHF (congestive heart failure) (HCC) 11/21/2023   Thrombocytopenia 10/04/2023   Rhabdomyolysis 10/04/2023   Community acquired pneumonia 10/04/2023   Influenza A with pneumonia 09/28/2023   Acute encephalopathy 01/01/2022   Complicated UTI (urinary tract infection) 01/01/2022   Acute kidney injury  superimposed on chronic kidney disease 01/01/2022   History of CVA with residual deficit 01/01/2022   Dementia (HCC) 11/25/2021   Pyuria 11/25/2021   Type 2 diabetes mellitus (HCC) 11/24/2021   Stroke (HCC) 11/24/2021   Vitamin D  deficiency 11/24/2021   Immunodeficiency, unspecified 11/24/2021   Seizure (HCC) 11/24/2021   Sensorineural hearing loss 11/24/2021   Posttraumatic stress disorder 11/24/2021   Obstructive sleep apnea of adult 11/24/2021   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/24/2021   History of renal transplant 11/24/2021   Liver transplant status (HCC) 11/24/2021   Hemiplegia, unspecified affecting unspecified side (HCC) 11/24/2021   Essential hypertension 11/24/2021   Dysphagia, unspecified 11/24/2021   Chronic hepatitis C (HCC) 11/24/2021   Cirrhosis of liver (HCC) 11/24/2021   Cerebellar stroke syndrome 11/24/2021   Cardiomegaly 11/24/2021   Beta thalassemia trait 11/24/2021   Benign prostatic hyperplasia with lower urinary tract symptoms 11/24/2021   Atrial fibrillation (HCC) 11/24/2021   Liver transplant recipient [facility]) 11/24/2021   Acute CVA (cerebrovascular accident) (HCC) 11/24/2021   Bradycardia 07/04/2021   Anemia 05/21/2009   PCP:  [facility]:   [id number] - [address] [place] - [address] [place] [address] Phone: [phone] Fax: [phone]  [facility] - [place], [place] - [address] [place] [address] Phone: [phone] Fax: [phone]  [facility] [address] [place] [address] Phone: [phone] Fax: [phone]     Social Drivers of Health (SDOH) Social History: SDOH Screenings   Food Insecurity: No Food Insecurity (08/03/2024)  Housing: Low Risk  (08/03/2024)  Transportation Needs: No  Transportation Needs (08/03/2024)  Utilities: Not At Risk (08/03/2024)  Social Connections: Moderately Integrated (08/03/2024)  Tobacco Use: Medium Risk (08/04/2024)   SDOH Interventions: Food Insecurity Interventions: Intervention Not Indicated Housing Interventions: Intervention Not Indicated Utilities Interventions: Intervention Not Indicated Social Connections Interventions: Intervention Not Indicated   Readmission Risk Interventions    11/27/2021   10:20 AM  Readmission Risk Prevention Plan  Transportation Screening Complete  PCP or Specialist Appt within 3-5 Days Complete  HRI or Home Care Consult Complete  Social Work Consult for Recovery Care Planning/Counseling Complete  Palliative Care Screening Not Applicable  Medication Review [employer]) Complete

## 2024-08-05 NOTE — Plan of Care (Signed)
  Problem: Education: Goal: Individualized Educational Video(s) Outcome: Progressing   Problem: Fluid Volume: Goal: Ability to maintain a balanced intake and output will improve Outcome: Progressing   Problem: Health Behavior/Discharge Planning: Goal: Ability to manage health-related needs will improve Outcome: Progressing

## 2024-08-05 NOTE — Assessment & Plan Note (Addendum)
-   Baseline hemoglobin appears to be around 8.5 to 10 g/dL -At baseline on admission with mild gradual drop; sacral wound has been mildly bleeding at home prior to admission as well which is likely source along with underlying CKD -Hgb down to 6.8 g/dL this morning, 87/10/7972 - Patient does get intermittent Epogen injections outpatient; family wishing to hold off on blood transfusion for now and we will use ESA agent (Aranesp ) for now (given on 12/5)

## 2024-08-05 NOTE — Plan of Care (Signed)
  Problem: Fluid Volume: Goal: Ability to maintain a balanced intake and output will improve Outcome: Progressing   Problem: Nutritional: Goal: Maintenance of adequate nutrition will improve Outcome: Progressing Goal: Progress toward achieving an optimal weight will improve Outcome: Progressing

## 2024-08-05 NOTE — Progress Notes (Addendum)
 [facility] Infectious Diseases Follow Up Note  Patient Identification: Patient Name: Gilbert Wilson MRN: [id number] Admit Date: 08/02/2024 11:04 AM Age: 78 y.o.Today's Date: 08/05/2024  Reason for Visit: Infected sacral ulcer  Principal Problem:   Sacral ulcer (HCC) Active Problems:   Type 2 diabetes mellitus (HCC)   History of renal transplant   Essential hypertension   Atrial fibrillation (HCC)   Liver transplant recipient (HCC)   Dementia (HCC)   History of CVA with residual deficit   Protein-calorie malnutrition, severe   Bladder outlet obstruction   Perirectal abscess   Chronic ulcer of left heel (HCC)   Chronic kidney disease, stage 3b (HCC)   Goals of care, counseling/discussion  Antibiotics:  Vancomycin  12/2- Ceftriaxone  12/2-   Lines/Hardware:  Interval Events: Afebrile in the last 24 hours Labs remarkable for NA 148, creatinine down to 1.78, hemoglobin 6.9  Assessment 78 year old male with prior history of CVA with hemiparesis with bedbound status, severe dementia, type II DM, seizure, HTN, hepatic artery aneurysm, s/p liver and kidney transplant, BOO requiring I and O cath, recurrent UTI, DVT and A-fib no longer on AC, OA, sacral decubitus ulcer left heel pressure injury who was brought in by ED on 12/2 by PT[facility] for increasing lethargy, AMS, poor p.o. appetite, concern for worsening sacral wound, left heel pressure ulcer.  Was recently treated with Bactrim for presumed UTI 2 weeks prior to admit which was followed by diarrhea.  No known fever at home.  Admitted with  # Sacrococcygeal ulcer with sinus tract extending to the bone, sacral osteomyelitis # Right ischial ulcer extending to ischial tuberosity # Right perirectal/intrapelvic abscess-ruled out by pelvic CT - Surgery does not think sacral ulcer is infected, no needs debridement but recommended hydrotherapy with PT  # Chronic ulcer of left heel-stable  with no signs of infection  # Anemia - hb 6/9, getting transfused   # History of liver transplant and renal transplant - On cyclosporine  and mycophenolate   # CKD -creatinine at baseline # Bedbound status # Malnutrition  Recommendations - Continue daptomycin  and ceftriaxone  inpatient while getting hydrotherapy - Can transition to p.o. doxycycline and Augmentin for 2 weeks course when ready for discharge for possible skin and soft tissue infection of sacral area - Discussed with daughter, poor chances of healing of the wound in the setting of bedbound status/poor mobility, malnutrition, no plans for surgical debridement and poor flap candidate.  Discussed that prolonged course of IV antibiotics with PICC line is not going to be helpful for healing of the wound without comprehensive approach per recent literature and would treat this as skin and soft tissue infection.  -Discussed that given open wound in the sacral area with chances of contamination with urine/feces, increased risk of future recurrent infections and will need to be treated as needed - Continue wound care and hydrotherapy - Universal/standard isolation precautions D/w primary team  ID will sign off  Rest of the management as per the primary team. Thank you for the consult. Please page with pertinent questions or concerns.  ______________________________________________________________________ Subjective patient seen and examined at the bedside.  Daughter at bedside.  Thinks her father is more alert today.   Vitals BP (!) 142/50 (BP Location: Left Wrist)   Pulse 65   Temp 98.4 F (36.9 C) (Oral)   Resp 16   Ht 6' 1 (1.854 m)   Wt 89.8 kg   SpO2 99%   BMI 26.12 kg/m '     Physical Exam Constitutional: Elderly  male lying in the bed, more awake and alert    Comments: HEENT WNL  Cardiovascular:     Rate and Rhythm: Normal rate     Heart sounds:   Pulmonary:     Effort: Pulmonary effort is normal.      Comments:   Abdominal:     Palpations: Abdomen is non distended    Tenderness:   Musculoskeletal:        General: No swelling or tenderness in peripheral joints  Skin:    Comments: No rashes  Pictures taken during hydrotherapy today reviewed        Neurological:     General: Awake, alert, rt sided weakness  Pertinent Microbiology Results for orders placed or performed during the hospital encounter of 08/02/24  Resp panel by RT-PCR (RSV, Flu A&B, Covid) Anterior Nasal Swab     Status: None   Collection Time: 08/02/24 12:28 PM   Specimen: Anterior Nasal Swab  Result Value Ref Range Status   SARS Coronavirus 2 by RT PCR NEGATIVE NEGATIVE Final   Influenza A by PCR NEGATIVE NEGATIVE Final   Influenza B by PCR NEGATIVE NEGATIVE Final    Comment: (NOTE) The Xpert Xpress SARS-CoV-2/FLU/RSV plus assay is intended as an aid in the diagnosis of influenza from Nasopharyngeal swab specimens and should not be used as a sole basis for treatment. Nasal washings and aspirates are unacceptable for Xpert Xpress SARS-CoV-2/FLU/RSV testing.  Fact Sheet for Patients: bloggercourse.com  Fact Sheet for Healthcare Providers: seriousbroker.it  This test is not yet approved or cleared by the [place]  FDA and has been authorized for detection and/or diagnosis of SARS-CoV-2 by FDA under an Emergency Use Authorization (EUA). This EUA will remain in effect (meaning this test can be used) for the duration of the COVID-19 declaration under Section 564(b)(1) of the Act, 21 U.S.C. section 360bbb-3(b)(1), unless the authorization is terminated or revoked.     Resp Syncytial Virus by PCR NEGATIVE NEGATIVE Final    Comment: (NOTE) Fact Sheet for Patients: bloggercourse.com  Fact Sheet for Healthcare Providers: seriousbroker.it  This test is not yet approved or cleared by the [place]   FDA and has been authorized for detection and/or diagnosis of SARS-CoV-2 by FDA under an Emergency Use Authorization (EUA). This EUA will remain in effect (meaning this test can be used) for the duration of the COVID-19 declaration under Section 564(b)(1) of the Act, 21 U.S.C. section 360bbb-3(b)(1), unless the authorization is terminated or revoked.  Performed at [facility], [address], [place], [place] [address]   Blood culture (routine x 2)     Status: None (Preliminary result)   Collection Time: 08/02/24  3:00 PM   Specimen: BLOOD  Result Value Ref Range Status   Specimen Description BLOOD LEFT ANTECUBITAL  Final   Special Requests   Final    BOTTLES DRAWN AEROBIC AND ANAEROBIC Blood Culture results may not be optimal due to an inadequate volume of blood received in culture bottles   Culture   Final    NO GROWTH 3 DAYS Performed at [facility], [address] [place], [place] [address]    Report Status PENDING  Incomplete  Blood culture (routine x 2)     Status: None (Preliminary result)   Collection Time: 08/02/24  8:31 PM   Specimen: BLOOD LEFT HAND  Result Value Ref Range Status   Specimen Description BLOOD LEFT HAND  Final   Special Requests   Final  BOTTLES DRAWN AEROBIC AND ANAEROBIC Blood Culture results may not be optimal due to an inadequate volume of blood received in culture bottles   Culture   Final    NO GROWTH 3 DAYS Performed at [facility], [address], [place], [place] [address]    Report Status PENDING  Incomplete   Pertinent Lab.    Latest Ref Rng & Units 08/05/2024    4:30 AM 08/04/2024    1:59 AM 08/03/2024    5:00 AM  CBC  WBC 4.0 - 10.5 K/uL 9.8  10.0  10.2   Hemoglobin 13.0 - 17.0 g/dL 6.9  8.0  7.0   Hematocrit 39.0 - 52.0 % 22.8  26.0  22.4   Platelets 150 - 400 K/uL 416  255  394       Latest Ref Rng & Units 08/05/2024    4:30 AM 08/04/2024    9:05 AM 08/03/2024    5:00 AM  CMP  Glucose 70 - 99 mg/dL 857   862  62   BUN 8 - 23 mg/dL 53  59  63   Creatinine 0.61 - 1.24 mg/dL 8.21  8.17  8.20   Sodium 135 - 145 mmol/L 148  143  144   Potassium 3.5 - 5.1 mmol/L 4.2  4.4  4.2   Chloride 98 - 111 mmol/L 124  118  119   CO2 22 - 32 mmol/L 15  16  15    Calcium  8.9 - 10.3 mg/dL 9.1  8.8  8.9      Pertinent Imaging today Plain films and CT images have been personally visualized and interpreted; radiology reports have been reviewed. Decision making incorporated into the Impression /   CT PELVIS W CONTRAST Result Date: 08/04/2024 EXAM: CT PELVIS, WITH IV CONTRAST 08/04/2024 04:19:17 PM TECHNIQUE: Axial images were acquired through the pelvis with IV contrast. Reformatted images were reviewed. Automated exposure control, iterative reconstruction, and/or weight based adjustment of the mA/kV was utilized to reduce the radiation dose to as low as reasonably achievable. 75 mL (iohexol  (OMNIPAQUE ) 350 MG/ML injection 75 mL IOHEXOL  350 MG/ML SOLN) was administered intravenously. COMPARISON: None available. CLINICAL HISTORY: possible perirectal abscess FINDINGS: BONES: Chronic heterotopic ossification adjacent to the greater trochanter of the left femur. No acute fracture or focal osseous lesion. JOINTS: No dislocation. The joint spaces are normal. SOFT TISSUES: Ulceration superficial to the lower sacrum and coccyx with abnormal soft tissue density material in the subcutaneous space that contacts the dorsal spine of the lower sacrum. No abscess or drainable fluid collection. Anasarca. INTRAPELVIC CONTENTS: Right lower quadrant transplant kidney with mild hydroureteronephrosis and severe periureteral stranding. Foley catheter within the urinary bladder. The urinary bladder is thick-walled but decompressed. The prostate measures 6.7 cm in transverse dimension. Calcific aortic atherosclerosis. IMPRESSION: 1. No perirectal abscess or drainable fluid collection. 2. Ulceration superficial to the lower sacrum and coccyx with  abnormal soft tissue density material in the subcutaneous space that contacts the dorsal spine of the lower sacrum. No abscess or drainable fluid collection. 3. Right lower quadrant transplant kidney with mild hydroureteronephrosis and severe periureteral stranding, likely ascending urinary infection. Electronically signed by: Franky Stanford MD 08/04/2024 08:22 PM EST RP Workstation: [id number]   I spent 50 minutes involved in face-to-face and non-face-to-face activities for this patient on the day of the visit. Professional time spent includes the following activities: Preparing to see the patient (review of tests), Obtaining and reviewing separately obtained history (hospitalist progress note), Performing a  medically appropriate examination and evaluation , Ordering medications/labs, referring and communicating with other health care professionals, Documenting clinical information in the EMR, Independently interpreting results (not separately reported), Communicating results to the daughter, Counseling and educating the daughter and Care coordination (not separately reported).   Plan d/w requesting provider as well as ID pharm D  Of note, portions of this note may have been created with voice recognition software. While this note has been edited for accuracy, occasional wrong-word or 'sound-a-like' substitutions may have occurred due to the inherent limitations of voice recognition software.   Electronically signed by:   Annalee Orem, MD Infectious Disease Physician [facility] Pager: [phone]

## 2024-08-05 NOTE — Progress Notes (Signed)
 Physical Therapy Wound Treatment Patient Details  Name: Gilbert Wilson MRN: [id number] Date of Birth: 1946-04-29  Today's Date: 08/05/2024 Time: 1015-1115 Time Calculation (min): 60 min  Subjective  Subjective Assessment Subjective: daughter in room throughout Patient and Family Stated Goals: healing wounds Date of Onset:  (unknown) Prior Treatments: Medihoney/Santyl with dressing change  Pain Score:  Pt premedicated prior to session. Pt with 8/10 pain with wound care and positioning   Wound Assessment  Wound Pressure Injury Ischial tuberosity Right Unstageable - Full thickness tissue loss in which the base of the injury is covered by slough (yellow, tan, gray, green or brown) and/or eschar (tan, brown or black) in the wound bed. (Active)  Wound Image   08/05/24 1500  Site / Wound Assessment Black 08/05/24 1500  Peri-wound Assessment Denuded;Pink 08/05/24 1500  Wound Length (cm) 5.2 cm 08/05/24 1500  Wound Width (cm) 4.9 cm 08/05/24 1500  Wound Surface Area (cm^2) 20.01 cm^2 08/05/24 1500  Drainage Description No odor 08/05/24 1500  Drainage Amount None 08/05/24 1500  Treatment Irrigation 08/05/24 1500  Dressing Type Honey;Moist to dry;Barrier Film (skin prep);Foam - Lift dressing to assess site every shift;Gauze (Comment);Normal saline moist dressing 08/05/24 1500  Dressing Changed New 08/05/24 1500  Dressing Status Clean, Dry, Intact 08/05/24 1500  State of Healing Eschar 08/05/24 1500  % Wound base Red or Granulating 10% 08/05/24 0523  % Wound base Black/Eschar 95% 08/05/24 1500  % Wound base Other/Granulation Tissue (Comment) 5% 08/05/24 1500  Margins Other (Comment) 08/05/24 1500  Non-staged Wound Description Full thickness 08/05/24 1500     Wound 08/02/24 1830 Pressure Injury Sacrum Medial Unstageable - Full thickness tissue loss in which the base of the injury is covered by slough (yellow, tan, gray, green or brown) and/or eschar (tan, brown or black) in the wound bed.  (Active)  Wound Image   08/05/24 1500  Site / Wound Assessment Painful;Pink;Red;Yellow;Black;Brown;Dusky 08/05/24 1500  Peri-wound Assessment Denuded;Pink 08/05/24 1500  Wound Length (cm) 7.3 cm 08/05/24 1500  Wound Width (cm) 10.4 cm 08/05/24 1500  Wound Surface Area (cm^2) 59.63 cm^2 08/05/24 1500  Wound Depth (cm) 2.6 cm 08/05/24 1500  Wound Volume (cm^3) 103.354 cm^3 08/05/24 1500  Drainage Description Serous 08/05/24 1500  Drainage Amount Moderate 08/05/24 1500  Treatment Irrigation 08/05/24 1500  Dressing Type Foam - Lift dressing to assess site every shift;Gauze (Comment);Honey;Normal saline moist dressing 08/05/24 1500  Dressing Changed Changed 08/05/24 1500  Dressing Status Clean, Dry, Intact 08/05/24 1500  State of Healing Non-healing 08/05/24 1500  % Wound base Red or Granulating 5% 08/05/24 1500  % Wound base Yellow/Fibrinous Exudate 35% 08/05/24 1500  % Wound base Black/Eschar 60% 08/05/24 1500  Margins Unattached edges (unapproximated) 08/05/24 1500  Non-staged Wound Description Full thickness 08/05/24 1500        Wound Assessment and Plan  Wound Therapy - Assess/Plan/Recommendations Wound Therapy - Clinical Statement: Pt with full thickness wounds to the sacrum and the R ischial tuberosity. Pt's family has done a wonderful job of taking care of the patient, Daughter reports turning schedule and wound care. Unfortunately, has progressively moved less over the last 6 months and is struggling to get enough nutrition. Sacral wound has a roof of eschar over approximately 60% of the wound and the base of the wound is grossly stringy yellow slough. PT able to remove an appreciable amount of eschar and necrotic tissue from wound. Wound over the R ischial tuberosity is covered entirely with eschar which PT scored prior to  placement of Medihoney soaked gauze. Pt will follow back on Monday for one additional treatment before going home. Wound Therapy - Functional Problem List:  decreased mobility, poor nutrition, Factors Delaying/Impairing Wound Healing: Multiple medical problems, Immobility, Incontinence, Infection - systemic/local Hydrotherapy Plan: Debridement, Dressing change, Patient/family education Wound Therapy - Frequency: 2X / week Wound Therapy - Follow Up Recommendations: dressing changes by family/patient  Wound Therapy Goals- Improve the function of patient's integumentary system by progressing the wound(s) through the phases of wound healing (inflammation - proliferation - remodeling) by: Wound Therapy Goals - Improve the function of patient's integumentary system by progressing the wound(s) through the phases of wound healing by: Decrease Necrotic Tissue to: 40 Decrease Necrotic Tissue - Progress: Goal set today Increase Granulation Tissue to: 20 Increase Granulation Tissue - Progress: Goal set today  Goals will be updated until maximal potential achieved or discharge criteria met.  Discharge criteria: when goals achieved, discharge from hospital, MD decision/surgical intervention, no progress towards goals, refusal/missing three consecutive treatments without notification or medical reason.  GP     Charges PT Wound Care Charges $Wound Debridement up to 20 cm: < or equal to 20 cm $ Wound Debridement each add'l 20 sqcm: 4 $PT Hydrotherapy Visit: 1 Visit     Markice Torbert B. Fleeta Lapidus PT, DPT Acute Rehabilitation Services Please use secure chat or  Call Office [phone]   Almarie KATHEE Fleeta [facility] 08/05/2024, 3:54 PM

## 2024-08-05 NOTE — Plan of Care (Signed)
   Problem: Coping: Goal: Ability to adjust to condition or change in health will improve Outcome: Progressing   Problem: Nutritional: Goal: Maintenance of adequate nutrition will improve Outcome: Progressing

## 2024-08-05 NOTE — Progress Notes (Signed)
 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 male  with past medical history of dementia, prior CVA with right side hemiplegia, liver failure s/p transplant, ESRD s/p renal transplant, seizure disorder admitted on 08/02/2024 with lethargy and leukocytosis.   Per H&P on 12/2 by Dahal MD, patient has been physically and cognitively been declining after discussion with family. Patient has had more diarrhea 2/2 antibiotics for recurrent UTIs resulting in contamination of a sacral decubitus ulcer. Sources of infection include urine and sacral ulcer given history of recurrent UTI as well as increased diarrhea which increases likelihood of contamination of wound.    CXR 08/02/2024 demonstrates hypoinflation without acute findings concerning for pneumonia. X-ray 08/02/2024 of left foot with heel pressure injury does not suggest osteomyelitis. MRI of pelvis on 08/02/2024 demonstrated small sinus tract extending to sacrum and coccyx that is suspicious for osteomyelitis as well as intrapelvic abscess. Media review of sacral ulcer and left heel pressure injury on 08/02/2024 shows significant deterioration compared to prior images taken on 01/05/2024. Leukocytosis without fever but with possible sources of infection including sacral ulcer  and intrapelvic abscess.    Palliative medicine consulted for goals of care conversation.   SUMMARY OF RECOMMENDATIONS  Full code, full scope, but knows options are limited Continue current management, plan for PO antibiotics on discharge [facility] Requested family to bring in [place] and AD documentation for EMR  Symptom management as below  Symptom Management:  Dilaudid  1-2 mg PO prn for pain  Code Status: Full Code  Prognosis: Unable to determine  Discharge Planning: Home with Home Health  Discussed with: [name] MD about family wanting current management but aware of poor prognosis overall with high risk of recurrent hospitalization in the future.   Subjective:   Subjective: Chart Reviewed. Updates received. Patient Assessed. Created space and opportunity for patient  and family to explore thoughts and feelings regarding current medical situation.  Per Radiology note on 08/05/2024 by Alvia PA - no drainable pocket of fluid for perirectal space.  Today's Discussion:  Met with patient and wife bedside. Discussed overall treatment plan with her and she shared that the family will continue current management with PO antibiotics on discharge. No PICC line for IV antibiotics given the patient's immunocompromised status. Discussed with family that the IR plan to drain pelvic abscess was not done due to no drainable pocket. Family knows that the management of sacral ulcer will be conservative with continued wound care with low chance of healing. Discussed palliative outpatient support as an extra layer, but family will inquire about the [place] palliative support. Family continues to reiterate that they are aware that overall the prognosis is poor but  wants to continue current management.   Review of Systems  Unable to perform ROS   Objective:   Primary Diagnoses: Present on Admission:  Sacral ulcer (HCC)  Essential hypertension  Dementia  (HCC)  Atrial fibrillation (HCC)  Protein-calorie malnutrition, severe  Bladder outlet obstruction   Vital Signs:  BP (!) 141/61 (BP Location: Left Arm)   Pulse 77   Temp 99.9 F (37.7 C) (Axillary)   Resp (!) 22   Ht 6' 1 (1.854 m)   Wt 89.8 kg   SpO2 98%   BMI 26.12 kg/m   Physical Exam Constitutional:      Comments: Being fed by wife.   HENT:     Head: Normocephalic.     Nose: Nose normal.  Eyes:     Extraocular Movements: Extraocular movements intact.  Cardiovascular:     Rate and Rhythm: Normal rate.     Pulses: Normal pulses.  Pulmonary:     Effort: Pulmonary effort is normal.  Abdominal:     Palpations: Abdomen is soft.  Skin:    General: Skin is warm.  Neurological:     Mental Status: He is alert.    Palliative Assessment/Data: 30%   Existing Vynca/ACP Documentation: Pending family bringing copies of HCPOA and AD  Thank you for allowing us  to participate in the care of LARONN DEVONSHIRE PMT will continue to support holistically.  I personally spent a total of 35 minutes in the care of the patient today including preparing to see the patient, getting/reviewing separately obtained history, performing a medically appropriate exam/evaluation, counseling and educating, referring and communicating with other health care professionals, and documenting clinical information in the EHR.  Fairy FORBES Shan DEVONNA  Palliative Medicine Team  Team Phone # [phone] (Nights/Weekends) 08/05/2024 7:51 AM

## 2024-08-05 NOTE — Progress Notes (Signed)
 Progress Note    Gilbert Wilson   FMW:[id number]  DOB: 04-20-1946  DOA: 08/02/2024     3 PCP: [name]  Initial CC: Lethargy  Hospital Course: Gilbert Wilson is a 78 y.o. male with PMH significant for severe dementia, h/o stroke with right hemiparesis bedbound status for about the past 3 years, total care dependent, h/o renal and liver transplant, seizure disorder, bladder outlet obstruction requiring in and out cath 4 times a day, frequent UTI, DVT and A-fib no longer on AC, osteoarthritis, sacral decubitus and left ankle pressure skin injury. Was hospitalized in April 2025 for 2 weeks for CHF exacerbation and again in May for flareup of left knee osteoarthritis.  He is cared for at home by his wife and daughter. Since about April 2025 he has had persistent and progressing sacral ulcer along with left heel ulcer.   Family endorsed on admission that patient has been noticeably progressively declining both physically and mentally. He has difficulty endorsing pain but has been noted to have pain involving his sacrum from a pressure ulcer. Recent courses of antibiotics for UTI have caused diarrhea which has further created difficulty managing his sacral wound.  Due to worsening lethargy, he was brought to the ER for further evaluation. Workup was concerning for sepsis and he was admitted for more workup.  Interval History:  No events overnight.  Daughter present bedside this morning.  Patient's spouse also on the phone. Palliative also following daily. Case discussed with ID, IR, PT, and surgery also.  Plan is ongoing debridement, repeat again on Monday while on IV abx thru weekend, then transition to PO abx on Monday for probable discharge home at that time. Patient is comfortable, minimal pain, and eating moderately well.   Assessment and Plan: * Sacral ulcer (HCC) - leukocytosis main variable; he was afebrile, overall non-toxic appearing but has persistent sacral  ulcer and worsening lethargy on admission - MRI sacrum shows multiple findings: Soft tissue ulceration over distal sacrum and coccyx with probable small sinus tract extending to bone suspicious for early OM; additional decubitus ulcer and deep sinus tract extending to the right ischial tuberosity - not a surgical candidate given poor functional status, nutritional status, bedbound, no chance of healing wound - continue local wound care - continue abx for now - Evaluated by general surgery also on 08/04/2024, appreciate recommendations - Undergoing local debridement with PT and lieu of hydrotherapy; coverage on daptomycin  and Rocephin  then planning to transition to doxycycline and Augmentin for 2 weeks at discharge per ID recommendations  Perirectal abscess-resolved as of 08/05/2024 - MRI sacrum also shows ill-defined right perirectal fluid collection suspicious for intrapelvic abscess - IR consulted to evaluate if abscess able to be drained - CT pelvis performed on 08/04/2024 and ruled out presence of abscess -No further need for IR attempted drainage given CT findings -See antibiotic plan under sacral ulcer  Goals of care, counseling/discussion - Patient has had progressive decline over the past several months.  Seemingly since April 2025 he has been unable to heal sacral ulcer and has had recurrent infections. -Agree with palliative care consult on admission. -For now continuing antibiotics.  Daughter and mother have discussed further and for now wishing to remain aggressive for this hospitalization  Chronic ulcer of left heel (HCC) - Unstageable - chronic eschar appearance; no surgical needs nor candidacy at this time - continue wound care dressings and pressure offloading  - Wound care also consulted  History of CVA with residual  deficit - Chronic left hemiparesis and bedbound at baseline -Fully dependent on all ADLs   Anemia of chronic disease - Baseline hemoglobin appears to be  around 8.5 to 10 g/dL -At baseline on admission with mild gradual drop; sacral wound has been mildly bleeding at home prior to admission as well which is likely source along with underlying CKD -Hgb down to 6.9 g/dL this morning, 87/11/7972 - Patient does get intermittent Epogen injections outpatient; family wishing to hold off on blood transfusion for now and we will use ESA agent (Aranesp ) for now  Liver transplant recipient [name] - Continue cyclosporine  and mycophenolate   History of renal transplant - Continue cyclosporine  and mycophenolate   Chronic kidney disease, stage 3b (HCC) - patient has history of CKD3b. 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  Minimally interactive  Essential hypertension - As needed medications for now  Type 2 diabetes mellitus (HCC) - Continue SSI and CBG monitoring   Antimicrobials: Rocephin  12/2 >> current  Vanc 12/2 >> 08/04/2024 Daptomycin  08/05/2024 >> current  DVT prophylaxis:  enoxaparin  (LOVENOX ) injection 30 mg Start: 08/02/24 1700   Code Status:   Code Status: Full Code  Mobility Assessment (Last 72 Hours)     Mobility Assessment     Row Name 08/04/24 2140 08/04/24 1000 08/03/24 2026 08/03/24 1338 08/03/24 0226   Does the patient have exclusion criteria? Yes- Bedfast (Level 1) - Select exclusion criteria in next row Yes- Bedfast (Level 1) - Select  exclusion criteria in next row Yes- Bedfast (Level 1) - Select exclusion criteria in next row Yes- Bedfast (Level 1) - Select exclusion criteria in next row Yes- Bedfast (Level 1) - Select exclusion criteria in next row   Mobility Assessment Exclusion Criteria -- No exclusion criteria present, perform mobility assessment -- No exclusion criteria present, perform mobility assessment No exclusion criteria present, perform mobility assessment   What is the highest level of mobility based on the mobility assessment? Level 2 (Chairfast) - Balance while sitting on edge of bed and cannot stand Level 2 (Chairfast) - Balance while sitting on edge of bed and cannot stand Level 2 (Chairfast) - Balance while sitting on edge of bed and cannot stand Level 2 (Chairfast) - Balance while sitting on edge of bed and cannot stand Level 2 (Chairfast) - Balance while sitting on edge of bed and cannot stand   Is the above level different from baseline mobility prior to current illness? Yes - Recommend PT order Yes - Recommend PT order Yes - Recommend PT order Yes - Recommend PT order Yes - Recommend PT order    Row Name 08/02/24 2013 08/02/24 1830         Does the patient have exclusion criteria? Yes- Bedfast (Level 1) - Select exclusion criteria in next row No- Perform mobility assessment      Mobility Assessment Exclusion Criteria No exclusion criteria present, perform mobility assessment No exclusion criteria present, perform mobility assessment  What is the highest level of mobility based on the mobility assessment? Level 2 (Chairfast) - Balance while sitting on edge of bed and cannot stand Level 1 (Bedfast) - Unable to balance while sitting on edge of bed      Is the above level different from baseline mobility prior to current illness? Yes - Recommend PT order No - Consider discontinuing PT/OT         Diet: Diet Orders (From admission, onward)     Start     Ordered   08/05/24 0859  DIET - DYS 1 Room service  appropriate? Yes; Fluid consistency: Thin  Diet effective now       Question Answer Comment  Room service appropriate? Yes   Fluid consistency: Thin      08/05/24 0858            Barriers to discharge: none  Disposition Plan: Home HH orders placed: TBD Status is: Inpt  Objective: Blood pressure (!) 142/50, pulse 65, temperature 98.4 F (36.9 C), temperature source Oral, resp. rate 16, height 6' 1 (1.854 m), weight 89.8 kg, SpO2 99%.  Examination:  Physical Exam Constitutional:      Comments: Chronically ill-appearing elderly gentleman lying in bed in no distress  HENT:     Head: Normocephalic and atraumatic.     Mouth/Throat:     Mouth: Mucous membranes are moist.  Eyes:     Extraocular Movements: Extraocular movements intact.  Cardiovascular:     Rate and Rhythm: Normal rate and regular rhythm.     Heart sounds: Normal heart sounds.  Pulmonary:     Effort: Pulmonary effort is normal. No respiratory distress.     Breath sounds: Normal breath sounds. No wheezing.  Abdominal:     General: Bowel sounds are normal. There is no distension.     Palpations: Abdomen is soft.     Tenderness: There is no abdominal tenderness.  Musculoskeletal:        General: Swelling (Generalized edema in all 4 extremities) present.     Cervical back: Normal range of motion and neck supple.     Right lower leg: Edema (2+) present.     Left lower leg: Edema (2+) present.     Comments: Left foot wrapped in dressing  Skin:    General: Skin is warm and dry.  Neurological:     Comments: Nods head appropriately to questions.  Able to say a few words.      Consultants:  ID General surgery Palliative care  Procedures:    Data Reviewed: Results for orders placed or performed during the hospital encounter of 08/02/24 (from the past 24 hours)  Glucose, capillary     Status: Abnormal   Collection Time: 08/04/24  5:53 PM  Result Value Ref Range   Glucose-Capillary 132 (H) 70 - 99 mg/dL   Glucose, capillary     Status: Abnormal   Collection Time: 08/04/24  9:41 PM  Result Value Ref Range   Glucose-Capillary 161 (H) 70 - 99 mg/dL  Basic metabolic panel with GFR     Status: Abnormal   Collection Time: 08/05/24  4:30 AM  Result Value Ref Range   Sodium 148 (H) 135 - 145 mmol/L   Potassium 4.2 3.5 - 5.1 mmol/L   Chloride 124 (H) 98 - 111 mmol/L   CO2 15 (L) 22 - 32 mmol/L   Glucose, Bld 142 (H) 70 - 99 mg/dL   BUN 53 (H) 8 - 23 mg/dL  Creatinine, Ser 1.78 (H) 0.61 - 1.24 mg/dL   Calcium  9.1 8.9 - 10.3 mg/dL   GFR, Estimated 39 (L) >60 mL/min   Anion gap 9 5 - 15  CBC with Differential/Platelet     Status: Abnormal   Collection Time: 08/05/24  4:30 AM  Result Value Ref Range   WBC 9.8 4.0 - 10.5 K/uL   RBC 2.85 (L) 4.22 - 5.81 MIL/uL   Hemoglobin 6.9 (LL) 13.0 - 17.0 g/dL   HCT 77.1 (L) 60.9 - 47.9 %   MCV 80.0 80.0 - 100.0 fL   MCH 24.2 (L) 26.0 - 34.0 pg   MCHC 30.3 30.0 - 36.0 g/dL   RDW 83.1 (H) 88.4 - 84.4 %   Platelets 416 (H) 150 - 400 K/uL   nRBC 0.0 0.0 - 0.2 %   Neutrophils Relative % 76 %   Neutro Abs 7.5 1.7 - 7.7 K/uL   Lymphocytes Relative 11 %   Lymphs Abs 1.0 0.7 - 4.0 K/uL   Monocytes Relative 10 %   Monocytes Absolute 1.0 0.1 - 1.0 K/uL   Eosinophils Relative 1 %   Eosinophils Absolute 0.1 0.0 - 0.5 K/uL   Basophils Relative 1 %   Basophils Absolute 0.1 0.0 - 0.1 K/uL   Immature Granulocytes 1 %   Abs Immature Granulocytes 0.11 (H) 0.00 - 0.07 K/uL  Magnesium      Status: None   Collection Time: 08/05/24  4:30 AM  Result Value Ref Range   Magnesium  1.9 1.7 - 2.4 mg/dL  CK     Status: Abnormal   Collection Time: 08/05/24  4:30 AM  Result Value Ref Range   Total CK 26 (L) 49 - 397 U/L  ABO/Rh     Status: None   Collection Time: 08/05/24  4:30 AM  Result Value Ref Range   ABO/RH(D)      O POS Performed at [facility], [address] [place], [place] [address]   Glucose, capillary     Status: Abnormal   Collection Time:  08/05/24  7:59 AM  Result Value Ref Range   Glucose-Capillary 129 (H) 70 - 99 mg/dL  Type and screen [facility]     Status: None (Preliminary result)   Collection Time: 08/05/24  9:24 AM  Result Value Ref Range   ABO/RH(D) O POS    Antibody Screen NEG    Sample Expiration 08/08/2024,2359    Unit Number [id number]    Blood Component Type RED CELLS,LR    Unit division 00    Status of Unit ALLOCATED    Transfusion Status OK TO TRANSFUSE    Crossmatch Result      Compatible Performed at [facility], [address], [place], [place] [address]   Prepare RBC (crossmatch)     Status: None   Collection Time: 08/05/24  9:25 AM  Result Value Ref Range   Order Confirmation      ORDER PROCESSED BY BLOOD BANK Performed at [facility], [address] [place], [place] [address]   Glucose, capillary     Status: Abnormal   Collection Time: 08/05/24 12:15 PM  Result Value Ref Range   Glucose-Capillary 115 (H) 70 - 99 mg/dL    I have reviewed pertinent nursing notes, vitals, labs, and images as necessary. I have ordered labwork to follow up on as indicated.  I have reviewed the last notes from staff over past 24 hours. I have discussed patient's  care plan and test results with nursing staff, CM/SW, and other staff as appropriate.  Old records reviewed in assessment of this patient  Time spent: Greater than 50% of the 55 minute visit was spent in counseling/coordination of care for the patient as laid out in the A&P.   LOS: 3 days   Alm Apo, MD [facility] 08/05/2024, 4:26 PM

## 2024-08-06 LAB — CBC WITH DIFFERENTIAL/PLATELET
Abs Immature Granulocytes: 0.13 K/uL — ABNORMAL HIGH (ref 0.00–0.07)
Basophils Absolute: 0.1 K/uL (ref 0.0–0.1)
Basophils Relative: 1 %
Eosinophils Absolute: 0.3 K/uL (ref 0.0–0.5)
Eosinophils Relative: 3 %
HCT: 22.2 % — ABNORMAL LOW (ref 39.0–52.0)
Hemoglobin: 6.8 g/dL — CL (ref 13.0–17.0)
Immature Granulocytes: 1 %
Lymphocytes Relative: 15 %
Lymphs Abs: 1.3 K/uL (ref 0.7–4.0)
MCH: 24.3 pg — ABNORMAL LOW (ref 26.0–34.0)
MCHC: 30.6 g/dL (ref 30.0–36.0)
MCV: 79.3 fL — ABNORMAL LOW (ref 80.0–100.0)
Monocytes Absolute: 0.8 K/uL (ref 0.1–1.0)
Monocytes Relative: 9 %
Neutro Abs: 6.4 K/uL (ref 1.7–7.7)
Neutrophils Relative %: 71 %
Platelets: 426 K/uL — ABNORMAL HIGH (ref 150–400)
RBC: 2.8 MIL/uL — ABNORMAL LOW (ref 4.22–5.81)
RDW: 16.8 % — ABNORMAL HIGH (ref 11.5–15.5)
WBC: 9 K/uL (ref 4.0–10.5)
nRBC: 0 % (ref 0.0–0.2)

## 2024-08-06 LAB — BASIC METABOLIC PANEL WITH GFR
Anion gap: 5 (ref 5–15)
BUN: 50 mg/dL — ABNORMAL HIGH (ref 8–23)
CO2: 17 mmol/L — ABNORMAL LOW (ref 22–32)
Calcium: 9.2 mg/dL (ref 8.9–10.3)
Chloride: 124 mmol/L — ABNORMAL HIGH (ref 98–111)
Creatinine, Ser: 1.61 mg/dL — ABNORMAL HIGH (ref 0.61–1.24)
GFR, Estimated: 44 mL/min — ABNORMAL LOW (ref >60)
Glucose, Bld: 142 mg/dL — ABNORMAL HIGH (ref 70–99)
Potassium: 4.2 mmol/L (ref 3.5–5.1)
Sodium: 146 mmol/L — ABNORMAL HIGH (ref 135–145)

## 2024-08-06 LAB — HCV RNA QUANT: HCV Quantitative: NOT DETECTED [IU]/mL (ref >50)

## 2024-08-06 LAB — GLUCOSE, CAPILLARY
Glucose-Capillary: 140 mg/dL — ABNORMAL HIGH (ref 70–99)
Glucose-Capillary: 161 mg/dL — ABNORMAL HIGH (ref 70–99)
Glucose-Capillary: 159 mg/dL — ABNORMAL HIGH (ref 70–99)
Glucose-Capillary: 175 mg/dL — ABNORMAL HIGH (ref 70–99)

## 2024-08-06 LAB — MAGNESIUM: Magnesium: 1.9 mg/dL (ref 1.7–2.4)

## 2024-08-06 MED ORDER — TAMSULOSIN HCL 0.4 MG PO CAPS
0.4000 mg | ORAL_CAPSULE | Freq: Every day | ORAL | Status: DC
  Administered 2024-08-07 – 2024-08-13 (×7): 0.4 mg via ORAL
  Filled 2024-08-06 (×7): qty 1

## 2024-08-06 MED ORDER — COLLAGENASE 250 UNIT/GM EX OINT
TOPICAL_OINTMENT | Freq: Every day | CUTANEOUS | Status: DC
  Administered 2024-08-07: 1 via TOPICAL
  Filled 2024-08-06 (×4): qty 30

## 2024-08-06 NOTE — Progress Notes (Signed)
 Progress Note    Gilbert Wilson   FMW:[id number]  DOB: 1946-08-26  DOA: 08/02/2024     4 PCP: [name]  Initial CC: Lethargy  Hospital Course: Gilbert FAUCETT is a 78 y.o. male with PMH significant for severe dementia, h/o stroke with right hemiparesis bedbound status for about the past 3 years, total care dependent, h/o renal and liver transplant, seizure disorder, bladder outlet obstruction requiring in and out cath 4 times a day, frequent UTI, DVT and A-fib no longer on AC, osteoarthritis, sacral decubitus and left ankle pressure skin injury. Was hospitalized in April 2025 for 2 weeks for CHF exacerbation and again in May for flareup of left knee osteoarthritis.  He is cared for at home by his wife and daughter. Since about April 2025 he has had persistent and progressing sacral ulcer along with left heel ulcer.   Family endorsed on admission that patient has been noticeably progressively declining both physically and mentally. He has difficulty endorsing pain but has been noted to have pain involving his sacrum from a pressure ulcer. Recent courses of antibiotics for UTI have caused diarrhea which has further created difficulty managing his sacral wound.  Due to worsening lethargy, he was brought to the ER for further evaluation. Workup was concerning for sepsis and he was admitted for more workup.  Interval History:  No events overnight. Resting comfortably. Wife present bedside this morning. Hgb still low but wishing to still hold off on transfusion. Reviewed plan and update given.    Assessment and Plan: * Sacral ulcer (HCC) - leukocytosis main variable; he was afebrile, overall non-toxic appearing but has persistent sacral ulcer and worsening lethargy on admission - MRI sacrum shows multiple findings: Soft tissue ulceration over distal sacrum and coccyx with probable small sinus tract extending to bone suspicious for early OM; additional decubitus ulcer and  deep sinus tract extending to the right ischial tuberosity - not a surgical candidate given poor functional status, nutritional status, bedbound, no chance of healing wound - continue local wound care - continue abx for now - Evaluated by general surgery also on 08/04/2024, appreciate recommendations - Undergoing local debridement with PT and lieu of hydrotherapy; coverage on daptomycin  and Rocephin  then planning to transition to doxycycline  and Augmentin  for 2 weeks at discharge per ID recommendations  Perirectal abscess-resolved as of 08/05/2024 - MRI sacrum also shows ill-defined right perirectal fluid collection suspicious for intrapelvic abscess - IR consulted to evaluate if abscess able to be drained - CT pelvis performed on 08/04/2024 and ruled out presence of abscess -No further need for IR attempted drainage given CT findings -See antibiotic plan under sacral ulcer  Goals of care, counseling/discussion - Patient has had progressive decline over the past several months.  Seemingly since April 2025 he has been unable to heal sacral ulcer and has had recurrent infections. -Agree with palliative care consult on admission. -For now continuing antibiotics.  Daughter and mother have discussed further and for now wishing to remain aggressive for this hospitalization  Chronic ulcer of left heel (HCC) - Unstageable - chronic eschar appearance; no surgical needs nor candidacy at this time - continue wound care dressings and pressure offloading  - Wound care also consulted  History of CVA with residual deficit - Chronic left hemiparesis and bedbound at baseline -Fully dependent on all ADLs   Anemia of chronic disease - Baseline hemoglobin appears to be around 8.5 to 10 g/dL -At baseline on admission with mild gradual drop; sacral  wound has been mildly bleeding at home prior to admission as well which is likely source along with underlying CKD -Hgb down to 6.8 g/dL this morning, 87/10/7972 -  Patient does get intermittent Epogen injections outpatient; family wishing to hold off on blood transfusion for now and we will use ESA agent (Aranesp ) for now (given on 12/5)  Liver transplant recipient [facility]) - Continue cyclosporine  and mycophenolate   History of renal transplant - Continue cyclosporine  and mycophenolate   Chronic kidney disease, stage 3b (HCC) - patient has history of CKD3b. 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 - UA not checked on admission; will be covered under current regimen  Atrial fibrillation (HCC) - given no plans for procedures, will resume Eliquis  on 12/6 if able (given acute worsening of Hgb still) - rate controlled   Protein-calorie malnutrition, severe - Somewhat poor intake at home intermittently -Low albumin  and suspected low prealbumin -Also signs of third spacing throughout  Dementia (HCC) - Mood stable; mentation at baseline.  Minimally interactive  Essential hypertension - As needed medications for now  Type 2 diabetes mellitus (HCC) - Continue SSI and CBG monitoring   Antimicrobials: Rocephin  12/2 >> current  Vanc 12/2 >> 08/04/2024 Daptomycin  08/05/2024 >> current  DVT prophylaxis:  enoxaparin  (LOVENOX ) injection 30 mg Start: 08/02/24 1700   Code Status:   Code Status: Full Code  Mobility Assessment (Last 72 Hours)     Mobility Assessment     Row Name 08/06/24 0825 08/05/24 2000 08/05/24 0950 08/04/24 2140 08/04/24 1000   Does the patient have exclusion criteria? Yes- Bedfast (Level 1) - Select exclusion criteria in next row Yes- Bedfast  (Level 1) - Select exclusion criteria in next row Yes- Bedfast (Level 1) - Select exclusion criteria in next row Yes- Bedfast (Level 1) - Select exclusion criteria in next row Yes- Bedfast (Level 1) - Select exclusion criteria in next row   Mobility Assessment Exclusion Criteria No exclusion criteria present, perform mobility assessment No exclusion criteria present, perform mobility assessment No exclusion criteria present, perform mobility assessment -- No exclusion criteria present, perform mobility assessment   What is the highest level of mobility based on the mobility assessment? Level 1 (Bedfast) - Unable to balance while sitting on edge of bed Level 1 (Bedfast) - Unable to balance while sitting on edge of bed Level 2 (Chairfast) - Balance while sitting on edge of bed and cannot stand Level 2 (Chairfast) - Balance while sitting on edge of bed and cannot stand Level 2 (Chairfast) - Balance while sitting on edge of bed and cannot stand   Is the above level different from baseline mobility prior to current illness? Yes - Recommend PT order Yes - Recommend PT order Yes - Recommend PT order Yes - Recommend PT order Yes - Recommend PT order    Row Name 08/03/24 2026           Does the patient have exclusion criteria? Yes- Bedfast (Level 1) - Select exclusion criteria in next row       What is the highest level of mobility based on the mobility assessment?  Level 2 (Chairfast) - Balance while sitting on edge of bed and cannot stand       Is the above level different from baseline mobility prior to current illness? Yes - Recommend PT order          Diet: Diet Orders (From admission, onward)     Start     Ordered   08/05/24 0859  DIET - DYS 1 Room service appropriate? Yes; Fluid consistency: Thin  Diet effective now       Question Answer Comment  Room service appropriate? Yes   Fluid consistency: Thin      08/05/24 0858            Barriers to discharge: none  Disposition Plan: Home HH  orders placed: TBD Status is: Inpt  Objective: Blood pressure (!) 138/58, pulse 66, temperature 98.2 F (36.8 C), temperature source Oral, resp. rate 15, height 6' 1 (1.854 m), weight 89.8 kg, SpO2 100%.  Examination:  Physical Exam Constitutional:      Comments: Chronically ill-appearing elderly gentleman lying in bed in no distress  HENT:     Head: Normocephalic and atraumatic.     Mouth/Throat:     Mouth: Mucous membranes are moist.  Eyes:     Extraocular Movements: Extraocular movements intact.  Cardiovascular:     Rate and Rhythm: Normal rate and regular rhythm.     Heart sounds: Normal heart sounds.  Pulmonary:     Effort: Pulmonary effort is normal. No respiratory distress.     Breath sounds: Normal breath sounds. No wheezing.  Abdominal:     General: Bowel sounds are normal. There is no distension.     Palpations: Abdomen is soft.     Tenderness: There is no abdominal tenderness.  Musculoskeletal:        General: Swelling (Generalized edema in all 4 extremities) present.     Cervical back: Normal range of motion and neck supple.     Right lower leg: Edema (2+) present.     Left lower leg: Edema (2+) present.     Comments: Left foot wrapped in dressing  Skin:    General: Skin is warm and dry.  Neurological:     Comments: Nods head appropriately to questions.  Able to say a few words.      Consultants:  ID General surgery Palliative care  Procedures:    Data Reviewed: Results for orders placed or performed during the hospital encounter of 08/02/24 (from the past 24 hours)  Glucose, capillary     Status: Abnormal   Collection Time: 08/05/24  5:19 PM  Result Value Ref Range   Glucose-Capillary 149 (H) 70 - 99 mg/dL  Glucose, capillary     Status: Abnormal   Collection Time: 08/05/24 10:08 PM  Result Value Ref Range   Glucose-Capillary 140 (H) 70 - 99 mg/dL  Basic metabolic panel with GFR     Status: Abnormal   Collection Time: 08/06/24  1:39 AM  Result  Value Ref Range   Sodium 146 (H) 135 - 145 mmol/L   Potassium 4.2 3.5 - 5.1 mmol/L   Chloride 124 (H) 98 - 111 mmol/L   CO2 17 (L) 22 - 32 mmol/L   Glucose, Bld 142 (H) 70 - 99 mg/dL   BUN 50 (H) 8 - 23 mg/dL   Creatinine, Ser 8.38 (H) 0.61 - 1.24 mg/dL   Calcium  9.2 8.9 - 10.3 mg/dL   GFR, Estimated 44 (L) >60 mL/min   Anion gap  5 5 - 15  CBC with Differential/Platelet     Status: Abnormal   Collection Time: 08/06/24  1:39 AM  Result Value Ref Range   WBC 9.0 4.0 - 10.5 K/uL   RBC 2.80 (L) 4.22 - 5.81 MIL/uL   Hemoglobin 6.8 (LL) 13.0 - 17.0 g/dL   HCT 77.7 (L) 60.9 - 47.9 %   MCV 79.3 (L) 80.0 - 100.0 fL   MCH 24.3 (L) 26.0 - 34.0 pg   MCHC 30.6 30.0 - 36.0 g/dL   RDW 83.1 (H) 88.4 - 84.4 %   Platelets 426 (H) 150 - 400 K/uL   nRBC 0.0 0.0 - 0.2 %   Neutrophils Relative % 71 %   Neutro Abs 6.4 1.7 - 7.7 K/uL   Lymphocytes Relative 15 %   Lymphs Abs 1.3 0.7 - 4.0 K/uL   Monocytes Relative 9 %   Monocytes Absolute 0.8 0.1 - 1.0 K/uL   Eosinophils Relative 3 %   Eosinophils Absolute 0.3 0.0 - 0.5 K/uL   Basophils Relative 1 %   Basophils Absolute 0.1 0.0 - 0.1 K/uL   Immature Granulocytes 1 %   Abs Immature Granulocytes 0.13 (H) 0.00 - 0.07 K/uL  Magnesium      Status: None   Collection Time: 08/06/24  1:39 AM  Result Value Ref Range   Magnesium  1.9 1.7 - 2.4 mg/dL  Glucose, capillary     Status: Abnormal   Collection Time: 08/06/24  7:57 AM  Result Value Ref Range   Glucose-Capillary 140 (H) 70 - 99 mg/dL  Glucose, capillary     Status: Abnormal   Collection Time: 08/06/24 12:13 PM  Result Value Ref Range   Glucose-Capillary 161 (H) 70 - 99 mg/dL    I have reviewed pertinent nursing notes, vitals, labs, and images as necessary. I have ordered labwork to follow up on as indicated.  I have reviewed the last notes from staff over past 24 hours. I have discussed patient's care plan and test results with nursing staff, CM/SW, and other staff as appropriate.  Old  records reviewed in assessment of this patient  Time spent: Greater than 50% of the 55 minute visit was spent in counseling/coordination of care for the patient as laid out in the A&P.   LOS: 4 days   Alm Apo, MD [facility] 08/06/2024, 2:19 PM

## 2024-08-06 NOTE — Plan of Care (Signed)
  Problem: Nutritional: Goal: Maintenance of adequate nutrition will improve Outcome: Progressing   Problem: Clinical Measurements: Goal: Will remain free from infection Outcome: Progressing   Problem: Activity: Goal: Risk for activity intolerance will decrease Outcome: Progressing   Problem: Nutrition: Goal: Adequate nutrition will be maintained Outcome: Progressing   Problem: Coping: Goal: Level of anxiety will decrease Outcome: Progressing   Problem: Elimination: Goal: Will not experience complications related to urinary retention Outcome: Progressing   Problem: Pain Managment: Goal: General experience of comfort will improve and/or be controlled Outcome: Progressing   Problem: Safety: Goal: Ability to remain free from injury will improve Outcome: Progressing

## 2024-08-07 DIAGNOSIS — E870 Hyperosmolality and hypernatremia: Secondary | ICD-10-CM

## 2024-08-07 LAB — BASIC METABOLIC PANEL WITH GFR
Anion gap: 6 (ref 5–15)
BUN: 42 mg/dL — ABNORMAL HIGH (ref 8–23)
CO2: 18 mmol/L — ABNORMAL LOW (ref 22–32)
Calcium: 9.1 mg/dL (ref 8.9–10.3)
Chloride: 128 mmol/L — ABNORMAL HIGH (ref 98–111)
Creatinine, Ser: 1.45 mg/dL — ABNORMAL HIGH (ref 0.61–1.24)
GFR, Estimated: 50 mL/min — ABNORMAL LOW (ref >60)
Glucose, Bld: 139 mg/dL — ABNORMAL HIGH (ref 70–99)
Potassium: 4.4 mmol/L (ref 3.5–5.1)
Sodium: 152 mmol/L — ABNORMAL HIGH (ref 135–145)

## 2024-08-07 LAB — CBC WITH DIFFERENTIAL/PLATELET
Abs Immature Granulocytes: 0.19 K/uL — ABNORMAL HIGH (ref 0.00–0.07)
Basophils Absolute: 0.1 K/uL (ref 0.0–0.1)
Basophils Relative: 1 %
Eosinophils Absolute: 0.3 K/uL (ref 0.0–0.5)
Eosinophils Relative: 3 %
HCT: 23.3 % — ABNORMAL LOW (ref 39.0–52.0)
Hemoglobin: 7 g/dL — ABNORMAL LOW (ref 13.0–17.0)
Immature Granulocytes: 2 %
Lymphocytes Relative: 13 %
Lymphs Abs: 1.4 K/uL (ref 0.7–4.0)
MCH: 23.7 pg — ABNORMAL LOW (ref 26.0–34.0)
MCHC: 30 g/dL (ref 30.0–36.0)
MCV: 79 fL — ABNORMAL LOW (ref 80.0–100.0)
Monocytes Absolute: 0.8 K/uL (ref 0.1–1.0)
Monocytes Relative: 7 %
Neutro Abs: 7.8 K/uL — ABNORMAL HIGH (ref 1.7–7.7)
Neutrophils Relative %: 74 %
Platelets: 510 K/uL — ABNORMAL HIGH (ref 150–400)
RBC: 2.95 MIL/uL — ABNORMAL LOW (ref 4.22–5.81)
RDW: 17.1 % — ABNORMAL HIGH (ref 11.5–15.5)
WBC: 10.6 K/uL — ABNORMAL HIGH (ref 4.0–10.5)
nRBC: 0 % (ref 0.0–0.2)

## 2024-08-07 LAB — CULTURE, BLOOD (ROUTINE X 2)
Culture: NO GROWTH
Culture: NO GROWTH

## 2024-08-07 LAB — GLUCOSE, CAPILLARY
Glucose-Capillary: 138 mg/dL — ABNORMAL HIGH (ref 70–99)
Glucose-Capillary: 168 mg/dL — ABNORMAL HIGH (ref 70–99)
Glucose-Capillary: 172 mg/dL — ABNORMAL HIGH (ref 70–99)
Glucose-Capillary: 180 mg/dL — ABNORMAL HIGH (ref 70–99)

## 2024-08-07 LAB — MAGNESIUM: Magnesium: 1.8 mg/dL (ref 1.7–2.4)

## 2024-08-07 MED ORDER — AMLODIPINE BESYLATE 5 MG PO TABS
5.0000 mg | ORAL_TABLET | Freq: Every day | ORAL | Status: DC
  Administered 2024-08-07 – 2024-08-13 (×7): 5 mg via ORAL
  Filled 2024-08-07 (×8): qty 1

## 2024-08-07 MED ORDER — DEXTROSE IN LACTATED RINGERS 5 % IV SOLN: INTRAVENOUS | Status: DC

## 2024-08-07 MED ORDER — ALBUMIN HUMAN 25 % IV SOLN
25.0000 g | Freq: Four times a day (QID) | INTRAVENOUS | Status: AC
  Administered 2024-08-07 – 2024-08-08 (×3): 25 g via INTRAVENOUS
  Administered 2024-08-08: 12.5 g via INTRAVENOUS
  Administered 2024-08-08 – 2024-08-09 (×4): 25 g via INTRAVENOUS
  Filled 2024-08-07 (×8): qty 100

## 2024-08-07 MED ORDER — LABETALOL HCL 5 MG/ML IV SOLN
10.0000 mg | INTRAVENOUS | Status: DC | PRN
  Administered 2024-08-09: 10 mg via INTRAVENOUS
  Filled 2024-08-07: qty 4

## 2024-08-07 MED ORDER — HYDRALAZINE HCL 20 MG/ML IJ SOLN
10.0000 mg | INTRAMUSCULAR | Status: DC | PRN
  Administered 2024-08-09: 10 mg via INTRAVENOUS
  Filled 2024-08-07: qty 1

## 2024-08-07 NOTE — Plan of Care (Signed)

## 2024-08-07 NOTE — Progress Notes (Signed)
 Progress Note    MICHOLAS DRUMWRIGHT   FMW:[id number]  DOB: 10-19-1945  DOA: 08/02/2024     5 PCP: [name]  Initial CC: Lethargy  Hospital Course: RAYNOR CALCATERRA is a 78 y.o. male with PMH significant for severe dementia, h/o stroke with right hemiparesis bedbound status for about the past 3 years, total care dependent, h/o renal and liver transplant, seizure disorder, bladder outlet obstruction requiring in and out cath 4 times a day, frequent UTI, DVT and A-fib no longer on AC, osteoarthritis, sacral decubitus and left ankle pressure skin injury. Was hospitalized in April 2025 for 2 weeks for CHF exacerbation and again in May for flareup of left knee osteoarthritis.  He is cared for at home by his wife and daughter. Since about April 2025 he has had persistent and progressing sacral ulcer along with left heel ulcer.   Family endorsed on admission that patient has been noticeably progressively declining both physically and mentally. He has difficulty endorsing pain but has been noted to have pain involving his sacrum from a pressure ulcer. Recent courses of antibiotics for UTI have caused diarrhea which has further created difficulty managing his sacral wound.  Due to worsening lethargy, he was brought to the ER for further evaluation. Workup was concerning for sepsis and he was admitted for more workup.  Interval History:  No events overnight. Resting comfortably and awake/alert.  Assessment and Plan: * Sacral ulcer (HCC) - leukocytosis main variable; he was afebrile, overall non-toxic appearing but has persistent sacral ulcer and worsening lethargy on admission - MRI sacrum shows multiple findings: Soft tissue ulceration over distal sacrum and coccyx with probable small sinus tract extending to bone suspicious for early OM; additional decubitus ulcer and deep sinus tract extending to the right ischial tuberosity - not a surgical candidate given poor functional  status, nutritional status, bedbound, no chance of healing wound - continue local wound care - continue abx for now - Evaluated by general surgery also on 08/04/2024, appreciate recommendations - Undergoing local debridement with PT and lieu of hydrotherapy; coverage on daptomycin  and Rocephin  then planning to transition to doxycycline  and Augmentin  for 2 weeks at discharge per ID recommendations  Perirectal abscess-resolved as of 08/05/2024 - MRI sacrum also shows ill-defined right perirectal fluid collection suspicious for intrapelvic abscess - IR consulted to evaluate if abscess able to be drained - CT pelvis performed on 08/04/2024 and ruled out presence of abscess -No further need for IR attempted drainage given CT findings -See antibiotic plan under sacral ulcer  Hypernatremia - suspect getting dehydrated some - will place on IVF for today  Goals of care, counseling/discussion - Patient has had progressive decline over the past several months.  Seemingly since April 2025 he has been unable to heal sacral ulcer and has had recurrent infections. -Agree with palliative care consult on admission. -For now continuing antibiotics.  Daughter and mother have discussed further and for now wishing to remain aggressive for this hospitalization  Chronic ulcer of left heel (HCC) - Unstageable - chronic eschar appearance; no surgical needs nor candidacy at this time - continue wound care dressings and pressure offloading  - Wound care also consulted  History of CVA with residual deficit - Chronic left hemiparesis and bedbound at baseline -Fully dependent on all ADLs   Anemia of chronic disease - Baseline hemoglobin appears to be around 8.5 to 10 g/dL -At baseline on admission with mild gradual drop; sacral wound has been mildly bleeding at home  prior to admission as well which is likely source along with underlying CKD - Hgb stable; 7 g/dL this morning - Patient does get intermittent Epogen  injections outpatient; family wishing to hold off on blood transfusion for now and we will use ESA agent (Aranesp ) for now (given on 12/5)  Liver transplant recipient [facility]) - Continue cyclosporine  and mycophenolate   History of renal transplant - Continue cyclosporine  and mycophenolate   Chronic kidney disease, stage 3b (HCC) - patient has history of CKD3b. 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 - UA not checked on admission; will be covered under current regimen  Atrial fibrillation (HCC) - given no plans for procedures, will resume Eliquis  on 12/6 if able (given acute worsening of Hgb still) - rate controlled   Protein-calorie malnutrition, severe - Somewhat poor intake at home intermittently -Low albumin  and suspected low prealbumin -Also signs of third spacing throughout  Dementia (HCC) - Mood stable; mentation at baseline.  Minimally interactive  Essential hypertension - As needed medications for now  Type 2 diabetes mellitus (HCC) - Continue SSI and CBG monitoring   Antimicrobials: Rocephin  12/2 >> current  Vanc 12/2 >> 08/04/2024 Daptomycin  08/05/2024 >> current  DVT prophylaxis:  enoxaparin  (LOVENOX ) injection 30 mg Start: 08/02/24 1700   Code Status:   Code Status: Full Code  Mobility Assessment (Last 72 Hours)     Mobility Assessment     Row Name 08/07/24 0834 08/06/24 2345 08/06/24 0825 08/05/24 2000 08/05/24 0950   Does the patient have exclusion criteria? Yes- Bedfast (Level 1) - Select exclusion criteria in next row Yes- Bedfast (Level 1) - Select exclusion  criteria in next row Yes- Bedfast (Level 1) - Select exclusion criteria in next row Yes- Bedfast (Level 1) - Select exclusion criteria in next row Yes- Bedfast (Level 1) - Select exclusion criteria in next row   Mobility Assessment Exclusion Criteria No exclusion criteria present, perform mobility assessment No exclusion criteria present, perform mobility assessment No exclusion criteria present, perform mobility assessment No exclusion criteria present, perform mobility assessment No exclusion criteria present, perform mobility assessment   What is the highest level of mobility based on the mobility assessment? Level 1 (Bedfast) - Unable to balance while sitting on edge of bed Level 1 (Bedfast) - Unable to balance while sitting on edge of bed Level 1 (Bedfast) - Unable to balance while sitting on edge of bed Level 1 (Bedfast) - Unable to balance while sitting on edge of bed Level 2 (Chairfast) - Balance while sitting on edge of bed and cannot stand   Is the above level different from baseline mobility prior to current illness? Yes - Recommend PT order Yes - Recommend PT order Yes - Recommend PT order Yes - Recommend PT order Yes - Recommend PT order    Row Name 08/04/24 2140           Does the patient have exclusion criteria? Yes- Bedfast (Level 1) - Select exclusion criteria in next row       What is the highest level of mobility based on the mobility assessment? Level 2 (Chairfast) -  Balance while sitting on edge of bed and cannot stand       Is the above level different from baseline mobility prior to current illness? Yes - Recommend PT order          Diet: Diet Orders (From admission, onward)     Start     Ordered   08/05/24 0859  DIET - DYS 1 Room service appropriate? Yes; Fluid consistency: Thin  Diet effective now       Question Answer Comment  Room service appropriate? Yes   Fluid consistency: Thin      08/05/24 0858            Barriers to discharge: none  Disposition Plan:  Home HH orders placed: TBD Status is: Inpt  Objective: Blood pressure (!) 170/67, pulse 86, temperature 98.8 F (37.1 C), resp. rate 17, height 6' 1 (1.854 m), weight 89.8 kg, SpO2 98%.  Examination:  Physical Exam Constitutional:      Comments: Chronically ill-appearing elderly gentleman lying in bed in no distress  HENT:     Head: Normocephalic and atraumatic.     Mouth/Throat:     Mouth: Mucous membranes are moist.  Eyes:     Extraocular Movements: Extraocular movements intact.  Cardiovascular:     Rate and Rhythm: Normal rate and regular rhythm.     Heart sounds: Normal heart sounds.  Pulmonary:     Effort: Pulmonary effort is normal. No respiratory distress.     Breath sounds: Normal breath sounds. No wheezing.  Abdominal:     General: Bowel sounds are normal. There is no distension.     Palpations: Abdomen is soft.     Tenderness: There is no abdominal tenderness.  Musculoskeletal:        General: Swelling (Generalized edema in all 4 extremities) present.     Cervical back: Normal range of motion and neck supple.     Right lower leg: Edema (2+) present.     Left lower leg: Edema (2+) present.     Comments: Left foot wrapped in dressing  Skin:    General: Skin is warm and dry.  Neurological:     Comments: Nods head appropriately to questions.  Able to say a few words.      Consultants:  ID General surgery Palliative care  Procedures:    Data Reviewed: Results for orders placed or performed during the hospital encounter of 08/02/24 (from the past 24 hours)  Glucose, capillary     Status: Abnormal   Collection Time: 08/06/24  5:29 PM  Result Value Ref Range   Glucose-Capillary 159 (H) 70 - 99 mg/dL  Glucose, capillary     Status: Abnormal   Collection Time: 08/06/24  9:58 PM  Result Value Ref Range   Glucose-Capillary 175 (H) 70 - 99 mg/dL  Glucose, capillary     Status: Abnormal   Collection Time: 08/07/24  7:48 AM  Result Value Ref Range    Glucose-Capillary 138 (H) 70 - 99 mg/dL  Basic metabolic panel with GFR     Status: Abnormal   Collection Time: 08/07/24  8:29 AM  Result Value Ref Range   Sodium 152 (H) 135 - 145 mmol/L   Potassium 4.4 3.5 - 5.1 mmol/L   Chloride 128 (H) 98 - 111 mmol/L   CO2 18 (L) 22 - 32 mmol/L   Glucose, Bld 139 (H) 70 - 99 mg/dL   BUN 42 (H) 8 - 23 mg/dL   Creatinine, Ser 8.54 (  H) 0.61 - 1.24 mg/dL   Calcium  9.1 8.9 - 10.3 mg/dL   GFR, Estimated 50 (L) >60 mL/min   Anion gap 6 5 - 15  CBC with Differential/Platelet     Status: Abnormal   Collection Time: 08/07/24  8:29 AM  Result Value Ref Range   WBC 10.6 (H) 4.0 - 10.5 K/uL   RBC 2.95 (L) 4.22 - 5.81 MIL/uL   Hemoglobin 7.0 (L) 13.0 - 17.0 g/dL   HCT 76.6 (L) 60.9 - 47.9 %   MCV 79.0 (L) 80.0 - 100.0 fL   MCH 23.7 (L) 26.0 - 34.0 pg   MCHC 30.0 30.0 - 36.0 g/dL   RDW 82.8 (H) 88.4 - 84.4 %   Platelets 510 (H) 150 - 400 K/uL   nRBC 0.0 0.0 - 0.2 %   Neutrophils Relative % 74 %   Neutro Abs 7.8 (H) 1.7 - 7.7 K/uL   Lymphocytes Relative 13 %   Lymphs Abs 1.4 0.7 - 4.0 K/uL   Monocytes Relative 7 %   Monocytes Absolute 0.8 0.1 - 1.0 K/uL   Eosinophils Relative 3 %   Eosinophils Absolute 0.3 0.0 - 0.5 K/uL   Basophils Relative 1 %   Basophils Absolute 0.1 0.0 - 0.1 K/uL   Immature Granulocytes 2 %   Abs Immature Granulocytes 0.19 (H) 0.00 - 0.07 K/uL  Magnesium      Status: None   Collection Time: 08/07/24  8:29 AM  Result Value Ref Range   Magnesium  1.8 1.7 - 2.4 mg/dL    I have reviewed pertinent nursing notes, vitals, labs, and images as necessary. I have ordered labwork to follow up on as indicated.  I have reviewed the last notes from staff over past 24 hours. I have discussed patient's care plan and test results with nursing staff, CM/SW, and other staff as appropriate.  Old records reviewed in assessment of this patient  Time spent: Greater than 50% of the 55 minute visit was spent in counseling/coordination of care for  the patient as laid out in the A&P.   LOS: 5 days   Alm Apo, MD [facility] 08/07/2024, 12:35 PM

## 2024-08-07 NOTE — Plan of Care (Signed)
   Problem: Education: Goal: Ability to describe self-care measures that may prevent or decrease complications (Diabetes Survival Skills Education) will improve Outcome: Progressing   Problem: Coping: Goal: Ability to adjust to condition or change in health will improve Outcome: Progressing   Problem: Fluid Volume: Goal: Ability to maintain a balanced intake and output will improve Outcome: Progressing

## 2024-08-07 NOTE — Assessment & Plan Note (Signed)
-   suspect getting dehydrated some - will place on IVF for today

## 2024-08-08 DIAGNOSIS — D638 Anemia in other chronic diseases classified elsewhere: Secondary | ICD-10-CM

## 2024-08-08 LAB — CBC WITH DIFFERENTIAL/PLATELET
Abs Immature Granulocytes: 0.18 K/uL — ABNORMAL HIGH (ref 0.00–0.07)
Basophils Absolute: 0.1 K/uL (ref 0.0–0.1)
Basophils Relative: 1 %
Eosinophils Absolute: 0.2 K/uL (ref 0.0–0.5)
Eosinophils Relative: 2 %
HCT: 21 % — ABNORMAL LOW (ref 39.0–52.0)
Hemoglobin: 6.3 g/dL — CL (ref 13.0–17.0)
Immature Granulocytes: 2 %
Lymphocytes Relative: 14 %
Lymphs Abs: 1.5 K/uL (ref 0.7–4.0)
MCH: 24 pg — ABNORMAL LOW (ref 26.0–34.0)
MCHC: 30 g/dL (ref 30.0–36.0)
MCV: 80.2 fL (ref 80.0–100.0)
Monocytes Absolute: 0.8 K/uL (ref 0.1–1.0)
Monocytes Relative: 8 %
Neutro Abs: 8.1 K/uL — ABNORMAL HIGH (ref 1.7–7.7)
Neutrophils Relative %: 73 %
Platelets: 446 K/uL — ABNORMAL HIGH (ref 150–400)
RBC: 2.62 MIL/uL — ABNORMAL LOW (ref 4.22–5.81)
RDW: 17.1 % — ABNORMAL HIGH (ref 11.5–15.5)
WBC: 10.8 K/uL — ABNORMAL HIGH (ref 4.0–10.5)
nRBC: 0 % (ref 0.0–0.2)

## 2024-08-08 LAB — GLUCOSE, CAPILLARY
Glucose-Capillary: 128 mg/dL — ABNORMAL HIGH (ref 70–99)
Glucose-Capillary: 105 mg/dL — ABNORMAL HIGH (ref 70–99)
Glucose-Capillary: 157 mg/dL — ABNORMAL HIGH (ref 70–99)
Glucose-Capillary: 145 mg/dL — ABNORMAL HIGH (ref 70–99)

## 2024-08-08 LAB — BASIC METABOLIC PANEL WITH GFR
Anion gap: 12 (ref 5–15)
BUN: 42 mg/dL — ABNORMAL HIGH (ref 8–23)
CO2: 17 mmol/L — ABNORMAL LOW (ref 22–32)
Calcium: 9.3 mg/dL (ref 8.9–10.3)
Chloride: 125 mmol/L — ABNORMAL HIGH (ref 98–111)
Creatinine, Ser: 1.45 mg/dL — ABNORMAL HIGH (ref 0.61–1.24)
GFR, Estimated: 50 mL/min — ABNORMAL LOW (ref >60)
Glucose, Bld: 110 mg/dL — ABNORMAL HIGH (ref 70–99)
Potassium: 4.6 mmol/L (ref 3.5–5.1)
Sodium: 154 mmol/L — ABNORMAL HIGH (ref 135–145)

## 2024-08-08 LAB — PREALBUMIN: Prealbumin: 11 mg/dL — ABNORMAL LOW (ref 18–38)

## 2024-08-08 LAB — HEMOGLOBIN AND HEMATOCRIT, BLOOD
HCT: 23.8 % — ABNORMAL LOW (ref 39.0–52.0)
Hemoglobin: 7.3 g/dL — ABNORMAL LOW (ref 13.0–17.0)

## 2024-08-08 LAB — PREPARE RBC (CROSSMATCH)

## 2024-08-08 LAB — MAGNESIUM: Magnesium: 1.7 mg/dL (ref 1.7–2.4)

## 2024-08-08 MED ORDER — HYDROMORPHONE HCL 2 MG PO TABS: 1.0000 mg | ORAL_TABLET | ORAL | 0 refills | Status: DC | PRN

## 2024-08-08 MED ORDER — STERILE WATER FOR INJECTION IV SOLN
INTRAVENOUS | Status: DC
  Filled 2024-08-08 (×2): qty 1000
  Filled 2024-08-08: qty 150

## 2024-08-08 MED ORDER — DOXYCYCLINE HYCLATE 100 MG PO TABS: 100.0000 mg | ORAL_TABLET | Freq: Two times a day (BID) | ORAL | 0 refills | Status: DC

## 2024-08-08 MED ORDER — ACETAMINOPHEN 500 MG PO TABS: 500.0000 mg | ORAL_TABLET | Freq: Two times a day (BID) | ORAL | Status: AC | PRN

## 2024-08-08 MED ORDER — AMOXICILLIN-POT CLAVULANATE 875-125 MG PO TABS: 1.0000 | ORAL_TABLET | Freq: Two times a day (BID) | ORAL | 0 refills | Status: DC

## 2024-08-08 MED ORDER — SODIUM CHLORIDE 0.9% IV SOLUTION: Freq: Once | INTRAVENOUS | Status: AC

## 2024-08-08 MED ORDER — ACETAMINOPHEN 325 MG PO TABS
650.0000 mg | ORAL_TABLET | Freq: Two times a day (BID) | ORAL | Status: DC | PRN
  Administered 2024-08-10: 650 mg via ORAL
  Filled 2024-08-08 (×2): qty 2

## 2024-08-08 NOTE — Progress Notes (Signed)
 Provider made aware of 6.3 Hgb. Family elected to have meds given versus transfusion at this time per 12/5 note.

## 2024-08-08 NOTE — Progress Notes (Signed)
 Physical Therapy Wound Treatment Patient Details  Name: Gilbert Wilson MRN: [id number] Date of Birth: [birth year]  Today's Date: 08/08/2024 Time: 1120-1206 Time Calculation (min): 46 min  Subjective  Subjective Assessment Subjective: daughter in room throughout Patient and Family Stated Goals: healing wounds Date of Onset:  (unknown) Prior Treatments: Medihoney/Santyl  with dressing change  Pain Score:  2/10 (faces; premedicated)  Wound Assessment  Wound Pressure Injury Ischial tuberosity Right Unstageable - Full thickness tissue loss in which the base of the injury is covered by slough (yellow, tan, gray, green or brown) and/or eschar (tan, brown or black) in the wound bed. (Active)  Site / Wound Assessment Black 08/08/24 1322  Peri-wound Assessment Denuded;Pink 08/08/24 1322  Wound Length (cm) 5.2 cm 08/05/24 1500  Wound Width (cm) 4.9 cm 08/05/24 1500  Wound Surface Area (cm^2) 20.01 cm^2 08/05/24 1500  Drainage Description No odor 08/08/24 1322  Drainage Amount None 08/08/24 1322  Treatment Debridement (Selective);Irrigation 08/08/24 1322  Dressing Type Honey;Moist to dry;Barrier Film (skin prep);Foam - Lift dressing to assess site every shift;Gauze (Comment);Normal saline moist dressing 08/08/24 1322  Dressing Changed Changed 08/08/24 1322  Dressing Status Clean, Dry, Intact 08/08/24 1322  State of Healing Eschar 08/08/24 1322  % Wound base Red or Granulating 10% 08/05/24 0523  % Wound base Black/Eschar 95% 08/08/24 1322  % Wound base Other/Granulation Tissue (Comment) 5% 08/08/24 1322  Margins Other (Comment) 08/08/24 1322  Non-staged Wound Description Full thickness 08/06/24 2345     Wound 08/02/24 1830 Pressure Injury Sacrum Medial Unstageable - Full thickness tissue loss in which the base of the injury is covered by slough (yellow, tan, gray, green or brown) and/or eschar (tan, brown or black) in the wound bed. (Active)  Site / Wound Assessment  Painful;Pink;Red;Yellow;Black;Brown;Dusky 08/08/24 1322  Peri-wound Assessment Denuded;Pink 08/08/24 1322  Wound Length (cm) 7.3 cm 08/05/24 1500  Wound Width (cm) 10.4 cm 08/05/24 1500  Wound Surface Area (cm^2) 59.63 cm^2 08/05/24 1500  Wound Depth (cm) 2.6 cm 08/05/24 1500  Wound Volume (cm^3) 103.354 cm^3 08/05/24 1500  Drainage Description Serous 08/08/24 1322  Drainage Amount Moderate 08/08/24 1322  Treatment Cleansed 08/07/24 1700  Dressing Type Foam - Lift dressing to assess site every shift;Gauze (Comment);Honey;Normal saline moist dressing 08/08/24 1322  Dressing Changed Changed 08/08/24 1322  Dressing Status Clean, Dry, Intact 08/08/24 1322  State of Healing Non-healing 08/08/24 1322  % Wound base Red or Granulating 20% 08/08/24 1322  % Wound base Yellow/Fibrinous Exudate 40% 08/08/24 1322  % Wound base Black/Eschar 50% 08/08/24 1322  Margins Unattached edges (unapproximated) 08/08/24 1322  Non-staged Wound Description Full thickness 08/06/24 2345   Selective Debridement (non-excisional) Selective Debridement (non-excisional) - Location: Sacrum, R ischial wound Selective Debridement (non-excisional) - Tools Used: Forceps, Scalpel, Scissors Selective Debridement (non-excisional) - Tissue Removed: Eschar    Wound Assessment and Plan  Wound Therapy - Assess/Plan/Recommendations Wound Therapy - Clinical Statement: R ischial wound has softened enough to begin removal of eschar across approximately ~80% of wound, other than adherent edges. Continued removal of eschar at roof of sacral wound, with increase in granulation tissue overall. Pt daughter present and supportive; asks appropriate questions regarding wound care and management. Wound Therapy - Functional Problem List: decreased mobility, poor nutrition, Factors Delaying/Impairing Wound Healing: Multiple medical problems, Immobility, Incontinence, Infection - systemic/local Hydrotherapy Plan: Debridement, Dressing change,  Patient/family education Wound Therapy - Frequency: 2X / week Wound Therapy - Follow Up Recommendations: dressing changes by family/patient  Wound Therapy Goals- Improve the function of  patient's integumentary system by progressing the wound(s) through the phases of wound healing (inflammation - proliferation - remodeling) by: Wound Therapy Goals - Improve the function of patient's integumentary system by progressing the wound(s) through the phases of wound healing by: Decrease Necrotic Tissue to: 40 Decrease Necrotic Tissue - Progress: Progressing toward goal Increase Granulation Tissue to: 20 Increase Granulation Tissue - Progress: Progressing toward goal Goals/treatment plan/discharge plan were made with and agreed upon by patient/family: Yes Time For Goal Achievement: 7 days Wound Therapy - Potential for Goals: Fair  Goals will be updated until maximal potential achieved or discharge criteria met.  Discharge criteria: when goals achieved, discharge from hospital, MD decision/surgical intervention, no progress towards goals, refusal/missing three consecutive treatments without notification or medical reason.  GP  Aleck Daring, PT, DPT Acute Rehabilitation Services Office [phone]      Charges PT Wound Care Charges $Wound Debridement up to 20 cm: < or equal to 20 cm $ Wound Debridement each add'l 20 sqcm: 3 $PT Hydrotherapy Visit: 1 Visit       Aleck ONEIDA Daring 08/08/2024, 1:32 PM

## 2024-08-08 NOTE — Progress Notes (Signed)
       Overnight   NAME: Gilbert Wilson MRN: [id number] DOB : 01/16/1946    Date of Service   08/08/2024   HPI/Events of Note    Notified by Nursing staff for HGb  Latest Reference Range & Units 08/08/24 04:31  WBC 4.0 - 10.5 K/uL 10.8 (H)  RBC 4.22 - 5.81 MIL/uL 2.62 (L)  Hemoglobin 13.0 - 17.0 g/dL 6.3 (LL)  HCT 60.9 - 47.9 % 21.0 (L)  MCV 80.0 - 100.0 fL 80.2  (LL): Data is critically low (H): Data is abnormally high (L): Data is abnormally low  However Attending notes :  ...family wishing to hold off on blood transfusion for now and we will use ESA agent (Aranesp ) for now (given on 12/5)    Interventions/ Plan   Wait for now due to Attending notation and treatment path  Type and Screen is still current.        Lynwood Kipper BSN MSNA MSN ACNPC-AG Acute Care Nurse Practitioner [facility]

## 2024-08-08 NOTE — Plan of Care (Signed)
  Problem: Safety: Goal: Ability to remain free from injury will improve Outcome: Progressing   Problem: Pain Managment: Goal: General experience of comfort will improve and/or be controlled Outcome: Progressing   Problem: Elimination: Goal: Will not experience complications related to bowel motility Outcome: Progressing

## 2024-08-08 NOTE — Progress Notes (Signed)
 Progress Note    Gilbert Wilson   FMW:[id number]  DOB: Jun 14, 1946  DOA: 08/02/2024     6 PCP: [name]  Initial CC: Lethargy  Hospital Course: Gilbert Wilson is a 78 y.o. male with PMH significant for severe dementia, h/o stroke with right hemiparesis bedbound status for about the past 3 years, total care dependent, h/o renal and liver transplant, seizure disorder, bladder outlet obstruction requiring in and out cath 4 times a day, frequent UTI, DVT and A-fib no longer on AC, osteoarthritis, sacral decubitus and left ankle pressure skin injury. Was hospitalized in April 2025 for 2 weeks for CHF exacerbation and again in May for flareup of left knee osteoarthritis.  He is cared for at home by his wife and daughter. Since about April 2025 he has had persistent and progressing sacral ulcer along with left heel ulcer.   Family endorsed on admission that patient has been noticeably progressively declining both physically and mentally. He has difficulty endorsing pain but has been noted to have pain involving his sacrum from a pressure ulcer. Recent courses of antibiotics for UTI have caused diarrhea which has further created difficulty managing his sacral wound.  Due to worsening lethargy, he was brought to the ER for further evaluation. Workup was concerning for sepsis and he was admitted for more workup.  Interval History:  No events overnight. Na higher and Hgb lower. IVF adjusted and 1 unit PRBC to transfuse today. If labs better Tuesday, then should be okay for discharge on Tuesday.   Assessment and Plan: * Sacral ulcer (HCC) - leukocytosis main variable; he was afebrile, overall non-toxic appearing but has persistent sacral ulcer and worsening lethargy on admission - MRI sacrum shows multiple findings: Soft tissue ulceration over distal sacrum and coccyx with probable small sinus tract extending to bone suspicious for early OM; additional decubitus ulcer and deep  sinus tract extending to the right ischial tuberosity - not a surgical candidate given poor functional status, nutritional status, bedbound, no chance of healing wound - continue local wound care - continue abx for now - Evaluated by general surgery also on 08/04/2024, appreciate recommendations - Undergoing local debridement with PT and lieu of hydrotherapy; coverage on daptomycin  and Rocephin  then planning to transition to doxycycline  and Augmentin  for 2 weeks at discharge per ID recommendations; scripts sent to [facility] on 12/8 for anticipated discharge on 12/9  Perirectal abscess-resolved as of 08/05/2024 - MRI sacrum also shows ill-defined right perirectal fluid collection suspicious for intrapelvic abscess - IR consulted to evaluate if abscess able to be drained - CT pelvis performed on 08/04/2024 and ruled out presence of abscess -No further need for IR attempted drainage given CT findings -See antibiotic plan under sacral ulcer  Hypernatremia - suspect getting dehydrated some - d/c NS and start bicarb drip for 1 day - if labs improved 12/9, should be stable for discharge  Goals of care, counseling/discussion - Patient has had progressive decline over the past several months.  Seemingly since April 2025 he has been unable to heal sacral ulcer and has had recurrent infections. -Agree with palliative care consult on admission. -For now continuing antibiotics.  Daughter and mother have discussed further and for now wishing to remain aggressive for this hospitalization  Chronic ulcer of left heel (HCC) - Unstageable - chronic eschar appearance; no surgical needs nor candidacy at this time - continue wound care dressings and pressure offloading  - Wound care also consulted  History of CVA with residual  deficit - Chronic left hemiparesis and bedbound at baseline -Fully dependent on all ADLs   Anemia of chronic disease - Baseline hemoglobin appears to be around 8.5 to 10 g/dL -At baseline  on admission with mild gradual drop; sacral wound has been mildly bleeding at home prior to admission as well which is likely source along with underlying CKD and some probable hemodilution with IVF - Patient does get intermittent Epogen injections outpatient; family wishing to hold off on blood transfusion for now and we will use ESA agent (Aranesp ) for now (given on 12/5) - since Hgb 6.3 g/dL on 87/1, will give 1 unit PRBC after discussion with family; follow up Hgb on 12/9; if improved, also stable for discharge 12/9 then  Liver transplant recipient [facility]) - Continue cyclosporine  and mycophenolate   History of renal transplant - Continue cyclosporine  and mycophenolate   Chronic kidney disease, stage 3b (HCC) - patient has history of CKD3b. 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 - UA not checked on admission; will be covered under current regimen - continue indwelling foley at discharge as noted above; HHRN to change monthly  Atrial fibrillation (HCC) - given no plans for procedures, will resume Eliquis  on 12/6 if able (given acute worsening of Hgb still) - rate controlled   Protein-calorie malnutrition, severe - Somewhat poor intake at home intermittently -Low albumin  and suspected low prealbumin -Also signs of third spacing throughout  Dementia (HCC) - Mood stable; mentation at baseline.  Minimally interactive  Essential hypertension - As needed medications for now  Type 2 diabetes mellitus (HCC) - Continue SSI and CBG monitoring   Antimicrobials: Rocephin  12/2 >> current  Vanc 12/2  >> 08/04/2024 Daptomycin  08/05/2024 >> current  DVT prophylaxis:  Lovenox  held  Code Status:   Code Status: Full Code  Mobility Assessment (Last 72 Hours)     Mobility Assessment     Row Name 08/08/24 0847 08/07/24 2145 08/07/24 0834 08/06/24 2345 08/06/24 0825   Does the patient have exclusion criteria? Yes- Bedfast (Level 1) - Select exclusion criteria in next row Yes- Bedfast (Level 1) - Select exclusion criteria in next row Yes- Bedfast (Level 1) - Select exclusion criteria in next row Yes- Bedfast (Level 1) - Select exclusion criteria in next row Yes- Bedfast (Level 1) - Select exclusion criteria in next row   Mobility Assessment Exclusion Criteria No exclusion criteria present, perform mobility assessment -- No exclusion criteria present, perform mobility assessment No exclusion criteria present, perform mobility assessment No exclusion criteria present, perform mobility assessment   What is the highest level of mobility based on the mobility assessment? Level 1 (Bedfast) - Unable to balance while sitting on edge of bed Level 1 (Bedfast) - Unable to balance while sitting on edge of bed Level 1 (Bedfast) - Unable to balance while sitting on edge of bed Level 1 (Bedfast) - Unable to balance while sitting on edge of bed Level 1 (Bedfast) - Unable to balance while sitting on edge of bed   Is the above level different from baseline mobility prior to current illness? Yes - Recommend PT order Yes -  Recommend PT order Yes - Recommend PT order Yes - Recommend PT order Yes - Recommend PT order    Row Name 08/05/24 2000           Does the patient have exclusion criteria? Yes- Bedfast (Level 1) - Select exclusion criteria in next row       Mobility Assessment Exclusion Criteria No exclusion criteria present, perform mobility assessment       What is the highest level of mobility based on the mobility assessment? Level 1 (Bedfast) - Unable to balance while sitting on edge of bed       Is the above  level different from baseline mobility prior to current illness? Yes - Recommend PT order          Diet: Diet Orders (From admission, onward)     Start     Ordered   08/05/24 0859  DIET - DYS 1 Room service appropriate? Yes; Fluid consistency: Thin  Diet effective now       Question Answer Comment  Room service appropriate? Yes   Fluid consistency: Thin      08/05/24 0858            Barriers to discharge: none  Disposition Plan: Home Tuesday Status is: Inpt  Objective: Blood pressure (!) 165/65, pulse 69, temperature 98.8 F (37.1 C), temperature source Oral, resp. rate 16, height 6' 1 (1.854 m), weight 89.8 kg, SpO2 98%.  Examination:  Physical Exam Constitutional:      Comments: Chronically ill-appearing elderly gentleman lying in bed in no distress  HENT:     Head: Normocephalic and atraumatic.     Mouth/Throat:     Mouth: Mucous membranes are moist.  Eyes:     Extraocular Movements: Extraocular movements intact.  Cardiovascular:     Rate and Rhythm: Normal rate and regular rhythm.     Heart sounds: Normal heart sounds.  Pulmonary:     Effort: Pulmonary effort is normal. No respiratory distress.     Breath sounds: Normal breath sounds. No wheezing.  Abdominal:     General: Bowel sounds are normal. There is no distension.     Palpations: Abdomen is soft.     Tenderness: There is no abdominal tenderness.  Musculoskeletal:        General: Swelling (Generalized edema in all 4 extremities) present.     Cervical back: Normal range of motion and neck supple.     Right lower leg: Edema (2+) present.     Left lower leg: Edema (2+) present.     Comments: Left foot wrapped in dressing  Skin:    General: Skin is warm and dry.  Neurological:     Comments: Nods head appropriately to questions.  Able to say a few words.      Consultants:  ID General surgery Palliative care  Procedures:    Data Reviewed: Results for orders placed or performed during the  hospital encounter of 08/02/24 (from the past 24 hours)  Glucose, capillary     Status: Abnormal   Collection Time: 08/07/24  5:07 PM  Result Value Ref Range   Glucose-Capillary 172 (H) 70 - 99 mg/dL  Glucose, capillary     Status: Abnormal   Collection Time: 08/07/24  9:19 PM  Result Value Ref Range   Glucose-Capillary 180 (H) 70 - 99 mg/dL  Prealbumin     Status: Abnormal   Collection Time: 08/08/24  4:31 AM  Result Value Ref Range   Prealbumin 11 (  L) 18 - 38 mg/dL  Basic metabolic panel with GFR     Status: Abnormal   Collection Time: 08/08/24  4:31 AM  Result Value Ref Range   Sodium 154 (H) 135 - 145 mmol/L   Potassium 4.6 3.5 - 5.1 mmol/L   Chloride 125 (H) 98 - 111 mmol/L   CO2 17 (L) 22 - 32 mmol/L   Glucose, Bld 110 (H) 70 - 99 mg/dL   BUN 42 (H) 8 - 23 mg/dL   Creatinine, Ser 8.54 (H) 0.61 - 1.24 mg/dL   Calcium  9.3 8.9 - 10.3 mg/dL   GFR, Estimated 50 (L) >60 mL/min   Anion gap 12 5 - 15  CBC with Differential/Platelet     Status: Abnormal   Collection Time: 08/08/24  4:31 AM  Result Value Ref Range   WBC 10.8 (H) 4.0 - 10.5 K/uL   RBC 2.62 (L) 4.22 - 5.81 MIL/uL   Hemoglobin 6.3 (LL) 13.0 - 17.0 g/dL   HCT 78.9 (L) 60.9 - 47.9 %   MCV 80.2 80.0 - 100.0 fL   MCH 24.0 (L) 26.0 - 34.0 pg   MCHC 30.0 30.0 - 36.0 g/dL   RDW 82.8 (H) 88.4 - 84.4 %   Platelets 446 (H) 150 - 400 K/uL   nRBC 0.0 0.0 - 0.2 %   Neutrophils Relative % 73 %   Neutro Abs 8.1 (H) 1.7 - 7.7 K/uL   Lymphocytes Relative 14 %   Lymphs Abs 1.5 0.7 - 4.0 K/uL   Monocytes Relative 8 %   Monocytes Absolute 0.8 0.1 - 1.0 K/uL   Eosinophils Relative 2 %   Eosinophils Absolute 0.2 0.0 - 0.5 K/uL   Basophils Relative 1 %   Basophils Absolute 0.1 0.0 - 0.1 K/uL   Immature Granulocytes 2 %   Abs Immature Granulocytes 0.18 (H) 0.00 - 0.07 K/uL  Magnesium      Status: None   Collection Time: 08/08/24  4:31 AM  Result Value Ref Range   Magnesium  1.7 1.7 - 2.4 mg/dL  Glucose, capillary     Status:  Abnormal   Collection Time: 08/08/24  8:31 AM  Result Value Ref Range   Glucose-Capillary 128 (H) 70 - 99 mg/dL  Glucose, capillary     Status: Abnormal   Collection Time: 08/08/24 12:12 PM  Result Value Ref Range   Glucose-Capillary 105 (H) 70 - 99 mg/dL    I have reviewed pertinent nursing notes, vitals, labs, and images as necessary. I have ordered labwork to follow up on as indicated.  I have reviewed the last notes from staff over past 24 hours. I have discussed patient's care plan and test results with nursing staff, CM/SW, and other staff as appropriate.  Old records reviewed in assessment of this patient  Time spent: Greater than 50% of the 55 minute visit was spent in counseling/coordination of care for the patient as laid out in the A&P.   LOS: 6 days   Alm Apo, MD [facility] 08/08/2024, 12:50 PM

## 2024-08-08 NOTE — Plan of Care (Signed)

## 2024-08-08 NOTE — Progress Notes (Signed)
 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 male  with past medical history of dementia, prior CVA with right side hemiplegia, liver failure s/p transplant, ESRD s/p renal transplant, seizure disorder admitted on 08/02/2024 with lethargy and leukocytosis.   Per H&P on 12/2 by Dahal MD, patient has been physically and cognitively been declining after discussion with family. Patient has had more diarrhea 2/2 antibiotics for recurrent UTIs resulting in contamination of a sacral decubitus ulcer. Sources of infection include urine and sacral ulcer given history of recurrent UTI as well as increased diarrhea which increases likelihood of contamination of wound.    CXR 08/02/2024 demonstrates hypoinflation without acute findings concerning for pneumonia. X-ray 08/02/2024 of left foot with heel pressure injury does not suggest osteomyelitis. MRI of pelvis on 08/02/2024 demonstrated small sinus tract extending to sacrum and coccyx that is suspicious for osteomyelitis as well as intrapelvic abscess. Media review of sacral ulcer and left heel pressure injury on 08/02/2024 shows significant deterioration compared to prior images taken on 01/05/2024. Leukocytosis without fever but with possible sources of infection including sacral ulcer  and intrapelvic abscess.    Palliative medicine consulted for goals of care conversation.   SUMMARY OF RECOMMENDATIONS Full code, full scope, aware of limited options for interventions Continue current management Follow up with family about HCPOA and AD documentation Symptom management as below  Symptom Management:  Dilaudid  1-2 mg PO PRN for pain  Code Status: Full Code  Prognosis: Unable to determine  Discharge Planning: To Be Determined  Discussed with: [name] MD about continued plan for patient to discharge with PO antibiotics and that a MOST form was left with family to consider future limitations to medical interventions if the family and patient goals are no longer being met with continued aggressive interventions.   Subjective:   Subjective: Chart Reviewed. Updates received. Patient Assessed. Created space and opportunity for patient  and family to explore thoughts and feelings regarding current medical situation.  Progress note by Blondie NP on 08/08/2024 - patient's hemoglobin was 6.3, no interventions currently with plans for further discussion with family and primary team as family previously stated that wanted to hold off on transfusion in hopes of improved blood count after administration of Aranesp .    Today's Discussion:  Met with patient and daughter at the bedside. Patient alert and answering questions   Reviewed plan with daughter as discussed with family on 08/05/2024 that the plan was to continue antibiotic regimen and plan to pursue PO antibiotics at discharge.   Reviewed hemoglobin with daughter and inquired about their plans considering the patient was not as responsive to the Aranesp  as hoped and that patient may need transfusion. Daughter shared that she will discuss  with primary attending.   Discussed MOST form and Hard Choices booklet with daughter and to consider care going forward. Daughter shares that the understands that this may be the beginning of  recurrent hospitalization. Shared with daughter that it is something to consider going forward if it gets to a point where patient spends more time at the hospital pursuing aggressive measures than spending time at home as the family and patient wishes.   Inquired about HCPOA and advanced directives, daughter shares that she is aware of HCPOA, but unsure if there is a living will completed. Reiterates that mother makes all medical decisions. Daughter shares that she will attempt to remind her mother to bring in the documentation for the patient's EMR.   Review of Systems  Musculoskeletal:  Positive for back pain.  All other systems reviewed and are negative.   Objective:   Primary Diagnoses: Present on Admission:  Sacral ulcer (HCC)  Essential hypertension  Dementia (HCC)  Atrial fibrillation (HCC)  Protein-calorie malnutrition, severe  Bladder outlet obstruction  Anemia of chronic disease   Vital Signs:  BP (!) 165/65 (BP Location: Left Arm)   Pulse 69   Temp 98.8 F (37.1 C) (Oral)   Resp 16   Ht 6' 1 (1.854 m)   Wt 89.8 kg   SpO2 98%   BMI 26.12 kg/m   Physical Exam HENT:     Head: Normocephalic.     Nose: Nose normal.     Mouth/Throat:     Mouth: Mucous membranes are dry.  Eyes:     Extraocular Movements: Extraocular movements intact.  Cardiovascular:     Rate and Rhythm: Normal rate.  Pulmonary:     Effort: Pulmonary effort is normal.  Abdominal:     Palpations: Abdomen is soft.  Skin:    General: Skin is warm.  Neurological:     Mental Status: He is alert. Mental status is at baseline.  Psychiatric:        Mood and Affect: Mood normal.     Palliative Assessment/Data: 40%   Existing Vynca/ACP Documentation: Pending HCPOA and AD from family  Thank you for allowing us  to participate in the care of ANDEN BARTOLO PMT will continue to support holistically.  I personally spent a total of 35 minutes in the care of the patient today including  preparing to see the patient, getting/reviewing separately obtained history, performing a medically appropriate exam/evaluation, counseling and educating, referring and communicating with other health care professionals, and documenting clinical information in the EHR.  Fairy FORBES Shan DEVONNA  Palliative Medicine Team  Team Phone # [phone] (Nights/Weekends) 08/08/2024 12:43 PM

## 2024-08-09 DIAGNOSIS — L98429 Non-pressure chronic ulcer of back with unspecified severity: Secondary | ICD-10-CM

## 2024-08-09 LAB — CBC WITH DIFFERENTIAL/PLATELET
Abs Immature Granulocytes: 0.13 K/uL — ABNORMAL HIGH (ref 0.00–0.07)
Basophils Absolute: 0.1 K/uL (ref 0.0–0.1)
Basophils Relative: 1 %
Eosinophils Absolute: 0.3 K/uL (ref 0.0–0.5)
Eosinophils Relative: 3 %
HCT: 23.2 % — ABNORMAL LOW (ref 39.0–52.0)
Hemoglobin: 7.3 g/dL — ABNORMAL LOW (ref 13.0–17.0)
Immature Granulocytes: 1 %
Lymphocytes Relative: 12 %
Lymphs Abs: 1.4 K/uL (ref 0.7–4.0)
MCH: 25 pg — ABNORMAL LOW (ref 26.0–34.0)
MCHC: 31.5 g/dL (ref 30.0–36.0)
MCV: 79.5 fL — ABNORMAL LOW (ref 80.0–100.0)
Monocytes Absolute: 0.8 K/uL (ref 0.1–1.0)
Monocytes Relative: 7 %
Neutro Abs: 8.6 K/uL — ABNORMAL HIGH (ref 1.7–7.7)
Neutrophils Relative %: 76 %
Platelets: 405 K/uL — ABNORMAL HIGH (ref 150–400)
RBC: 2.92 MIL/uL — ABNORMAL LOW (ref 4.22–5.81)
RDW: 16.6 % — ABNORMAL HIGH (ref 11.5–15.5)
WBC: 11.3 K/uL — ABNORMAL HIGH (ref 4.0–10.5)
nRBC: 0 % (ref 0.0–0.2)

## 2024-08-09 LAB — BASIC METABOLIC PANEL WITH GFR
Anion gap: 15 (ref 5–15)
Anion gap: 8 (ref 5–15)
BUN: 36 mg/dL — ABNORMAL HIGH (ref 8–23)
BUN: 35 mg/dL — ABNORMAL HIGH (ref 8–23)
CO2: 11 mmol/L — ABNORMAL LOW (ref 22–32)
CO2: 23 mmol/L (ref 22–32)
Calcium: 9.2 mg/dL (ref 8.9–10.3)
Calcium: 9.8 mg/dL (ref 8.9–10.3)
Chloride: 124 mmol/L — ABNORMAL HIGH (ref 98–111)
Chloride: 123 mmol/L — ABNORMAL HIGH (ref 98–111)
Creatinine, Ser: 1.43 mg/dL — ABNORMAL HIGH (ref 0.61–1.24)
Creatinine, Ser: 1.43 mg/dL — ABNORMAL HIGH (ref 0.61–1.24)
GFR, Estimated: 50 mL/min — ABNORMAL LOW (ref >60)
GFR, Estimated: 50 mL/min — ABNORMAL LOW (ref >60)
Glucose, Bld: 129 mg/dL — ABNORMAL HIGH (ref 70–99)
Glucose, Bld: 122 mg/dL — ABNORMAL HIGH (ref 70–99)
Potassium: 4.2 mmol/L (ref 3.5–5.1)
Potassium: 4.3 mmol/L (ref 3.5–5.1)
Sodium: 150 mmol/L — ABNORMAL HIGH (ref 135–145)
Sodium: 154 mmol/L — ABNORMAL HIGH (ref 135–145)

## 2024-08-09 LAB — BLOOD GAS, VENOUS
Acid-base deficit: 2.7 mmol/L — ABNORMAL HIGH (ref 0.0–2.0)
Bicarbonate: 20.6 mmol/L (ref 20.0–28.0)
Drawn by: 70495
O2 Saturation: 93.2 %
Patient temperature: 37.2
pCO2, Ven: 31 mmHg — ABNORMAL LOW (ref 44–60)
pH, Ven: 7.43 (ref 7.25–7.43)
pO2, Ven: 66 mmHg — ABNORMAL HIGH (ref 32–45)

## 2024-08-09 LAB — GLUCOSE, CAPILLARY
Glucose-Capillary: 125 mg/dL — ABNORMAL HIGH (ref 70–99)
Glucose-Capillary: 112 mg/dL — ABNORMAL HIGH (ref 70–99)
Glucose-Capillary: 150 mg/dL — ABNORMAL HIGH (ref 70–99)
Glucose-Capillary: 174 mg/dL — ABNORMAL HIGH (ref 70–99)

## 2024-08-09 LAB — TYPE AND SCREEN
ABO/RH(D): O POS
Antibody Screen: NEGATIVE
Unit division: 0

## 2024-08-09 LAB — MAGNESIUM: Magnesium: 1.8 mg/dL (ref 1.7–2.4)

## 2024-08-09 LAB — BPAM RBC
Blood Product Expiration Date: 202512312359
ISSUE DATE / TIME: 202512081502
Unit Type and Rh: 202512312359
Unit Type and Rh: 5100

## 2024-08-09 MED ORDER — MEDIHONEY WOUND/BURN DRESSING EX PSTE: 1.0000 | PASTE | Freq: Every day | CUTANEOUS | Status: DC

## 2024-08-09 MED ORDER — DEXTROSE 5 % IV SOLN: INTRAVENOUS | Status: AC

## 2024-08-09 MED ORDER — GUAIFENESIN-DM 100-10 MG/5ML PO SYRP
5.0000 mL | ORAL_SOLUTION | ORAL | Status: DC | PRN
  Administered 2024-08-09 – 2024-08-12 (×2): 5 mL via ORAL
  Filled 2024-08-09 (×2): qty 5

## 2024-08-09 NOTE — Plan of Care (Signed)
  Problem: Coping: Goal: Ability to adjust to condition or change in health will improve Outcome: Progressing   Problem: Metabolic: Goal: Ability to maintain appropriate glucose levels will improve Outcome: Progressing   Problem: Nutritional: Goal: Maintenance of adequate nutrition will improve Outcome: Progressing   Problem: Skin Integrity: Goal: Risk for impaired skin integrity will decrease Outcome: Progressing   

## 2024-08-09 NOTE — Progress Notes (Signed)
 Patient Sacral wound changed and foot wound pt tolerated well. Daughter at bedside.

## 2024-08-09 NOTE — Progress Notes (Signed)
 Speech Language Pathology Treatment: Dysphagia  Patient Details Name: Gilbert Wilson MRN: [id number] DOB: 1946-08-17 Today's Date: 08/09/2024 Time: 8687-8674 SLP Time Calculation (min) (ACUTE ONLY): 13 min  Assessment / Plan / Recommendation Clinical Impression  Pt's daughter wanted pt to continue nectar thick liquids at initial assessment. SLP noted pt's diet ordered as puree and thin liquids the following day. Son at bedside and stated family has been bringing in thickener from home to thicken liquids and have been present with majority of meals. Pt aroused from sleep and consumed nectar thick soda with delayed throat clear and puree without observable difficulty. Son states that pt sometimes coughs with nectar thick and educated he may have intermittent instances of airway compromise that may continue to progress. Per chart review and son report pt's respiratory status appears stable. Discussed options of continuing nectar thick or allowing pt to have thin liquids with known risks with possible outcomes/impacts for both. Noted conversations with Palliative care family and pt is full code. Son states he would prefer to continue with nectar thick at this time. Educated that risk of aspiration is chronic which he verbalized understanding and that family could decide in the future to allow pt to have thin liquids with risks if desired for comfort feeds. Pt to continue puree, nectar thick and SLP reviewed strategies to mitigate risks. ST to sign off at this time.    HPI HPI: 78yo male admitted 08/02/24 with AMS. PMH: severe dementia, LCVA, bedbound x3 years, renal and liver transplant, seizures, bladder outlet obstruction, recurrent UTI, DVT, AFib, OA, sacral decubitus, left ankle pressure injury, CHF, HepC, HTN, DM.      SLP Plan  All goals met;Discharge SLP treatment due to (comment)        Swallow Evaluation Recommendations   Recommendations: PO diet PO Diet Recommendation: Dysphagia 1  (Pureed);Mildly thick liquids (Level 2, nectar thick) Liquid Administration via: Cup;Straw Medication Administration: Crushed with puree Supervision: Full supervision/cueing for swallowing strategies;Full assist for feeding Postural changes: Position pt fully upright for meals Oral care recommendations: Oral care BID (2x/day)     Recommendations                     Oral care BID   Frequent or constant Supervision/Assistance Dysphagia, unspecified (R13.10)     All goals met;Discharge SLP treatment due to (comment)     Dustin Olam Bull  08/09/2024, 2:59 PM

## 2024-08-09 NOTE — Progress Notes (Signed)
 PROGRESS NOTE JA OHMAN  FMW:[id number] DOB: 07/07/1946 DOA: 08/02/2024 PCP: [name]  Brief Narrative/Hospital Course: Gilbert Wilson is a 78 y.o. male with PMH significant for severe dementia, h/o stroke with right hemiparesis bedbound status for about the past 3 years, total care dependent, h/o renal and liver transplant, seizure disorder, bladder outlet obstruction requiring in and out cath 4 times a day, frequent UTI, DVT and A-fib no longer on AC, osteoarthritis, sacral decubitus and left ankle pressure skin injury. Was hospitalized in April 2025 for 2 weeks for CHF exacerbation and again in May for flareup of left knee osteoarthritis.  He is cared for at home by his wife and daughter. Since about April 2025 he has had persistent and progressing sacral ulcer along with left heel ulcer.  Family endorsed on admission that patient has been noticeably progressively declining both physically and mentally. He has difficulty endorsing pain but has been noted to have pain involving his sacrum from a pressure ulcer. Recent courses of antibiotics for UTI have caused diarrhea which has further created difficulty managing his sacral wound. Due to worsening lethargy, he was brought to the ER for further evaluation. Workup was concerning for sepsis and he was admitted for more workup with sacral ulcer, left heel ulcer, possible perirectal abscess (ruled out on CT). Palliative care also followed.  Essentially treated IV antibiotics and ID recommending 2 weeks oral at discharge.  Tried to discharge but hypernatremia and worsened anemia kept for 1 more night to modify fluids and transfuse 1 unit of blood.  If labs improved on Tuesday, stable for discharge home. Daughter and wife anticipating d/c home Tues as well as long as labs are good. Scripts already sent to [place]  12.8  Subjective: Seen and examined today Daughter at the bedside reports patient had 2 loose bowel movement this morning  already Appears much more alert awake, at baseline Overnight on room air Tmax 100.5 last night afebrile since Labs reviewed sodium down to 150 from 155 hemoglobin at 7.3 stable and up from 6.3 following 1 unit PRBC transfusion Bicarb remains low at 11 from 17 remains on bicarb drip checking VBG normal with pH 7.4 repeat BMP ordered   Assessment and plan:  Sacral ulcer: He had persistent sacral ulcer and worsening lethargy on admission MRI sacrum shows multiple findings: Soft tissue ulceration over distal sacrum and coccyx with probable small sinus tract extending to bone suspicious for early OM; additional decubitus ulcer and deep sinus tract extending to the right ischial tuberosity Not a surgical candidate given poor functional status, nutritional status, bedbound and no chance of healing wound.  Continue to continue wound care, antibiotics as per ID, general surgery was consulted 12/4. Going local debridement with PT and lieu of hydrotherapy; coverage on daptomycin  and Rocephin  then planning to transition to doxycycline  and Augmentin  for 2 weeks at discharge per ID recommendations; scripts sent to [facility] on 12/8 for anticipated discharge    Perirectal abscess-resolved as of 08/05/2024 MRI sacrum also shows ill-defined right perirectal fluid collection suspicious for intrapelvic abscess IR consulted to evaluate if abscess able to be drained CT pelvis performed on 08/04/2024 and ruled out presence of abscess No further need for IR attempted drainage given CT findings See antibiotic plan under sacral ulcer   Hypernatremia suspect getting dehydrated some received IV fluids.  On sodium bicarb sodium down to 150-repeat labs and adjust IV fluids, given ongoing diarrhea will keep on IV fluids Recent Labs  Lab 08/05/24 0430  08/06/24 0139 08/07/24 0829 08/08/24 0431 08/09/24 0441  NA 148* 146* 152* 154* 150*    Goals of care, counseling/discussion Patient has had progressive decline over the past  several months.  Seemingly since April 2025 he has been unable to heal sacral ulcer and has had recurrent infections. Agree with palliative care consult on admission. For now continuing antibiotics.  Daughter and mother have discussed further and for now wishing to remain aggressive for this hospitalization   Chronic ulcer of left heel- Unstageable Chronic eschar appearance; no surgical needs nor candidacy at this time continue wound care dressings and pressure offloading CONT WOUND CARE   History of CVA with residual deficit Chronic left hemiparesis and bedbound at baseline Fully dependent on all ADLs    Anemia of chronic disease Baseline hemoglobin appears to be around 8.5 to 10 g/dL At baseline on admission with mild gradual drop; sacral wound has been mildly bleeding at home prior to admission as well which is likely source along with underlying CKD and some probable hemodilution with IVF-Patient does get intermittent Epogen injections outpatient; family wishing to hold off on blood transfusion for now and we will use ESA agent (Aranesp ) for now (given on 12/5) since Hgb 6.3 g/dL on 87/1, S/P 1 unit PRBC and hemoglobin improved appropriately 7.3.  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 Baseline creat ~ 1.6 - 1.8, eGFR~ 39-43 Recent Labs    01/06/24 0500 05/12/24 2125 08/02/24 1228 08/03/24 0500 08/04/24 0905 08/05/24 0430 08/06/24 0139 08/07/24 0829 08/08/24 0431 08/09/24 0441  BUN 42* 36* 73* 63* 59* 53* 50* 42* 42* 36*  CREATININE 1.70* 1.64* 1.98* 1.79* 1.82* 1.78* 1.61* 1.45* 1.45* 1.43*  CO2 18* 19* 19* 15* 16* 15* 17* 18* 17* 11*  K 4.2 4.0 4.2 4.2 4.4 4.2 4.2 4.4 4.6 4.2     Bladder outlet obstruction chronic urinary retention; does CIC at home by family multiples times daily agree with foley while hospitalized and discussed further  with wife on 12/6 and we will plan to continue foley at discharge for simplicity especially since already getting recurrent UTIs with CIC and going home with palliative care in place or at least [facility] to help change foley monthly. UA not checked on admission; will be covered under current regimen  continue indwelling foley at discharge as noted above; HHRN to change monthly   Atrial fibrillation  given no plans for procedures, will resume Eliquis  on 12/6 if able (given acute worsening of Hgb still rate controlled    Protein-calorie malnutrition, severe Somewhat poor intake at home intermittently Low albumin  and suspected low prealbumin Also signs of third spacing throughout   Dementia  Mood stable; mentation at baseline.  Minimally interactive   Essential hypertension As needed medications for now   Type 2 diabetes mellitus Continue SSI and CBG monitoring  Loose stool: Does not present prior to admission as per the daughter, continue probiotic, antibiotics, currently no abdominal tenderness or leukocytosis.  Continue supportive care  DVT prophylaxis: scd Code Status:   Code Status: Full Code Family Communication: plan of care discussed with patient/DAUGHTER at bedside. Patient status is: Remains hospitalized because of severity of illness Level of care: Med-Surg   Dispo: The patient is from: home            Anticipated disposition: TBD Objective: Vitals last 24 hrs: Vitals:   08/08/24 2212 08/09/24  0200 08/09/24 0620 08/09/24 0905  BP: (!) 163/53  122/67 112/78  Pulse: 66  68 64  Resp:   18 20  Temp: (!) 100.5 F (38.1 C) 98.2 F (36.8 C) 99.7 F (37.6 C) 98.9 F (37.2 C)  TempSrc: Axillary Oral Axillary Oral  SpO2: 95%  96% 95%  Weight:      Height:        Physical Examination: General exam: alert awake, ill looking HEENT:Oral mucosa moist, Ear/Nose WNL grossly Respiratory system: Bilaterally clear BS,no use of accessory muscle Cardiovascular system: S1 & S2 +,  No JVD. Gastrointestinal system: Abdomen soft,NT,ND, BS+ Nervous System: Alert, awake, no verbal response Extremities: extremities warm, leg edema + Skin: Warm, no rashes MSK: Normal muscle bulk,tone, power   Medications reviewed:  Scheduled Meds:  amLODipine   5 mg Oral Daily   Chlorhexidine  Gluconate Cloth  6 each Topical Q0600   collagenase    Topical Daily   cycloSPORINE   130 mg Oral BID   darbepoetin (ARANESP ) injection - DIALYSIS  60 mcg Subcutaneous Q Fri-1800   finasteride   5 mg Oral QHS   insulin  aspart  0-5 Units Subcutaneous QHS   insulin  aspart  0-9 Units Subcutaneous TID WC   lactobacillus   Oral TID   levETIRAcetam   750 mg Oral BID   mirtazapine   7.5 mg Oral QHS   mycophenolate   180 mg Oral BID   tamsulosin   0.4 mg Oral Daily   Continuous Infusions:  cefTRIAXone  (ROCEPHIN )  IV 2 g (08/08/24 1731)   DAPTOmycin  700 mg (08/08/24 1347)   sodium bicarbonate  150 mEq in sterile water  1,150 mL infusion 100 mL/hr at 08/08/24 2353   Diet: Diet Order             DIET - DYS 1 Room service appropriate? Yes; Fluid consistency: Thin  Diet effective now                    Data Reviewed: I have personally reviewed following labs and imaging studies ( see epic result tab) CBC: Recent Labs  Lab 08/05/24 0430 08/06/24 0139 08/07/24 0829 08/08/24 0431 08/08/24 1859 08/09/24 0441  WBC 9.8 9.0 10.6* 10.8*  --  11.3*  NEUTROABS 7.5 6.4 7.8* 8.1*  --  8.6*  HGB 6.9* 6.8* 7.0* 6.3* 7.3* 7.3*  HCT 22.8* 22.2* 23.3* 21.0* 23.8* 23.2*  MCV 80.0 79.3* 79.0* 80.2  --  79.5*  PLT 416* 426* 510* 446*  --  405*   CMP: Recent Labs  Lab 08/05/24 0430 08/06/24 0139 08/07/24 0829 08/08/24 0431 08/09/24 0441  NA 148* 146* 152* 154* 150*  K 4.2 4.2 4.4 4.6 4.2  CL 124* 124* 128* 125* 124*  CO2 15* 17* 18* 17* 11*  GLUCOSE 142* 142* 139* 110* 129*  BUN 53* 50* 42* 42* 36*  CREATININE 1.78* 1.61* 1.45* 1.45* 1.43*  CALCIUM  9.1 9.2 9.1 9.3 9.2  MG 1.9 1.9 1.8 1.7 1.8   GFR:  Estimated Creatinine Clearance: 48.9 mL/min (A) (by C-G formula based on SCr of 1.43 mg/dL (H)). Recent Labs  Lab 08/02/24 1228  AST 15  ALT 17  ALKPHOS 102  BILITOT 0.6  PROT 6.7  ALBUMIN  2.0*    Recent Labs  Lab 08/02/24 1228  LIPASE 20   No results for input(s): AMMONIA in the last 168 hours. Coagulation Profile: No results for input(s): INR, PROTIME in the last 168 hours. Unresulted Labs (From admission, onward)     Start     Ordered  08/09/24 0930  Basic metabolic panel  ONCE - URGENT,   URGENT       Question:  Specimen collection method  Answer:  Lab=Lab collect   08/09/24 0929   08/05/24 0500  CK  Every 7 days,   R      08/04/24 1856           Antimicrobials/Microbiology: Anti-infectives (From admission, onward)    Start     Dose/Rate Route Frequency Ordered Stop   08/08/24 0000  amoxicillin -clavulanate (AUGMENTIN ) 875-125 MG tablet        1 tablet Oral 2 times daily 08/08/24 1107 08/22/24 2359   08/08/24 0000  doxycycline  (VIBRA -TABS) 100 MG tablet        100 mg Oral 2 times daily 08/08/24 1107 08/22/24 2359   08/05/24 1400  DAPTOmycin  (CUBICIN ) IVPB 700 mg/167mL premix        8 mg/kg  89.8 kg 200 mL/hr over 30 Minutes Intravenous Daily 08/04/24 1855     08/03/24 1800  vancomycin  (VANCOCIN ) IVPB 1000 mg/200 mL premix  Status:  Discontinued        1,000 mg 200 mL/hr over 60 Minutes Intravenous Every 24 hours 08/02/24 1622 08/04/24 1855   08/03/24 1700  cefTRIAXone  (ROCEPHIN ) 2 g in sodium chloride  0.9 % 100 mL IVPB        2 g 200 mL/hr over 30 Minutes Intravenous Every 24 hours 08/02/24 1622     08/02/24 1445  vancomycin  (VANCOREADY) IVPB 1750 mg/350 mL        1,750 mg 175 mL/hr over 120 Minutes Intravenous  Once 08/02/24 1439 08/02/24 2056   08/02/24 1430  cefTRIAXone  (ROCEPHIN ) 2 g in sodium chloride  0.9 % 100 mL IVPB        2 g 200 mL/hr over 30 Minutes Intravenous  Once 08/02/24 1416 08/02/24 1838         Component Value Date/Time   SDES  BLOOD LEFT HAND 08/02/2024 2031   SPECREQUEST  08/02/2024 2031    BOTTLES DRAWN AEROBIC AND ANAEROBIC Blood Culture results may not be optimal due to an inadequate volume of blood received in culture bottles   CULT  08/02/2024 2031    NO GROWTH 5 DAYS Performed at [facility], [address] [place], [place] [address]    REPTSTATUS 08/07/2024 FINAL 08/02/2024 2031    Procedures:    Mennie LAMY, MD [facility] 08/09/2024, 11:08 AM

## 2024-08-10 ENCOUNTER — Inpatient Hospital Stay (HOSPITAL_COMMUNITY)

## 2024-08-10 LAB — URINALYSIS, MICROSCOPIC (REFLEX): Bacteria, UA: NONE SEEN

## 2024-08-10 LAB — BASIC METABOLIC PANEL WITH GFR
Anion gap: 8 (ref 5–15)
BUN: 35 mg/dL — ABNORMAL HIGH (ref 8–23)
CO2: 19 mmol/L — ABNORMAL LOW (ref 22–32)
Calcium: 9.2 mg/dL (ref 8.9–10.3)
Chloride: 122 mmol/L — ABNORMAL HIGH (ref 98–111)
Creatinine, Ser: 1.58 mg/dL — ABNORMAL HIGH (ref 0.61–1.24)
GFR, Estimated: 45 mL/min — ABNORMAL LOW (ref >60)
Glucose, Bld: 180 mg/dL — ABNORMAL HIGH (ref 70–99)
Potassium: 4.1 mmol/L (ref 3.5–5.1)
Sodium: 149 mmol/L — ABNORMAL HIGH (ref 135–145)

## 2024-08-10 LAB — CBC
HCT: 25.9 % — ABNORMAL LOW (ref 39.0–52.0)
Hemoglobin: 8 g/dL — ABNORMAL LOW (ref 13.0–17.0)
MCH: 24.9 pg — ABNORMAL LOW (ref 26.0–34.0)
MCHC: 30.9 g/dL (ref 30.0–36.0)
MCV: 80.7 fL (ref 80.0–100.0)
Platelets: 424 K/uL — ABNORMAL HIGH (ref 150–400)
RBC: 3.21 MIL/uL — ABNORMAL LOW (ref 4.22–5.81)
RDW: 16.8 % — ABNORMAL HIGH (ref 11.5–15.5)
WBC: 13.9 K/uL — ABNORMAL HIGH (ref 4.0–10.5)
nRBC: 0.1 % (ref 0.0–0.2)

## 2024-08-10 LAB — URINALYSIS, ROUTINE W REFLEX MICROSCOPIC
Bilirubin Urine: NEGATIVE
Glucose, UA: NEGATIVE mg/dL
Ketones, ur: NEGATIVE mg/dL
Leukocytes,Ua: NEGATIVE
Nitrite: NEGATIVE
Protein, ur: 100 mg/dL — AB
Specific Gravity, Urine: 1.015 (ref 1.005–1.030)
pH: 7 (ref 5.0–8.0)

## 2024-08-10 LAB — GLUCOSE, CAPILLARY
Glucose-Capillary: 147 mg/dL — ABNORMAL HIGH (ref 70–99)
Glucose-Capillary: 147 mg/dL — ABNORMAL HIGH (ref 70–99)
Glucose-Capillary: 162 mg/dL — ABNORMAL HIGH (ref 70–99)
Glucose-Capillary: 188 mg/dL — ABNORMAL HIGH (ref 70–99)

## 2024-08-10 MED ORDER — HYDROXYZINE HCL 10 MG PO TABS
10.0000 mg | ORAL_TABLET | Freq: Every day | ORAL | Status: DC | PRN
  Administered 2024-08-10 – 2024-08-12 (×2): 10 mg via ORAL
  Filled 2024-08-10 (×2): qty 1

## 2024-08-10 NOTE — Plan of Care (Signed)

## 2024-08-10 NOTE — Progress Notes (Signed)
 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 male  with past medical history of dementia, prior CVA with right side hemiplegia, liver failure s/p transplant, ESRD s/p renal transplant, seizure disorder admitted on 08/02/2024 with lethargy and leukocytosis.   Per H&P on 12/2 by Dahal MD, patient has been physically and cognitively been declining after discussion with family. Patient has had more diarrhea 2/2 antibiotics for recurrent UTIs resulting in contamination of a sacral decubitus ulcer. Sources of infection include urine and sacral ulcer given history of recurrent UTI as well as increased diarrhea which increases likelihood of contamination of wound.    CXR 08/02/2024 demonstrates hypoinflation without acute findings concerning for pneumonia. X-ray 08/02/2024 of left foot with heel pressure injury does not suggest osteomyelitis. MRI of pelvis on 08/02/2024 demonstrated small sinus tract extending to sacrum and coccyx that is suspicious for osteomyelitis as well as intrapelvic abscess. Media review of sacral ulcer and left heel pressure injury on 08/02/2024 shows significant deterioration compared to prior images taken on 01/05/2024. Leukocytosis without fever but with possible sources of infection including sacral ulcer and  intrapelvic abscess.    Palliative medicine consulted for goals of care conversation.   SUMMARY OF RECOMMENDATIONS Full code, full scope, aware of limited options for interventions Continue current management Follow up with family about HCPOA and AD documentation Symptom management as below  Symptom Management:  Dilaudid  1-2 mg PO prn for pain  Code Status: Full Code  Prognosis: Unable to determine  Discharge Planning: To Be Determined  Discussed with: Kc MD 08/10/2024 about family continued full code and full scope desires.   Subjective:   Subjective: Chart Reviewed. Updates received. Patient Assessed. Created space and opportunity for patient  and family to explore thoughts and feelings regarding current medical situation.  Today's Discussion:  Met with patient at bedside with daughter present and feeding patient breakfast. Patient consumed about 60% of meal, daughter shares that the normal amount is about 80% of meal completed. Discussed with daughter that patient remains in the hospital due to continued electrolyte derangements as well as continued low grade fever. Daughter remains hopeful to return home this week once the patient is medically stable.   Review of Systems  All other systems reviewed and are negative.   Objective:   Primary Diagnoses: Present on Admission:  Sacral ulcer (HCC)  Essential hypertension  Dementia (HCC)  Atrial fibrillation (HCC)  Protein-calorie malnutrition, severe  Bladder outlet obstruction  Anemia of chronic disease   Vital Signs:  BP (!) 160/58 (BP Location: Left Wrist)   Pulse 74   Temp 99 F (37.2 C) (Axillary)   Resp (!) 37   Ht 6' 1 (1.854 m)   Wt 89.8 kg  SpO2 98%   BMI 26.12 kg/m   Physical Exam Constitutional:      Appearance: He is ill-appearing.  HENT:     Head: Normocephalic.     Nose: Rhinorrhea present.     Mouth/Throat:     Pharynx: Oropharynx is clear.  Eyes:     Extraocular Movements: Extraocular  movements intact.  Cardiovascular:     Rate and Rhythm: Normal rate.     Pulses: Normal pulses.  Pulmonary:     Effort: Pulmonary effort is normal.  Abdominal:     Palpations: Abdomen is soft.  Skin:    General: Skin is warm and dry.  Neurological:     Mental Status: He is alert. Mental status is at baseline.  Psychiatric:        Mood and Affect: Mood normal.    Palliative Assessment/Data: 30%   Existing Vynca/ACP Documentation: Waiting for family to bring in  Thank you for allowing us  to participate in the care of JUWON SCRIPTER PMT will continue to support holistically.  I personally spent a total of 35 minutes in the care of the patient today including preparing to see the patient, getting/reviewing separately obtained history, performing a medically appropriate exam/evaluation, counseling and educating, referring and communicating with other health care professionals, and documenting clinical information in the EHR.  Trasean Delima E Salathiel Ferrara, PA-C  Palliative Medicine Team  Team Phone # [phone] (Nights/Weekends) 08/10/2024 1:26 PM

## 2024-08-10 NOTE — Plan of Care (Signed)
  Problem: Coping: Goal: Ability to adjust to condition or change in health will improve Outcome: Progressing   Problem: Fluid Volume: Goal: Ability to maintain a balanced intake and output will improve Outcome: Progressing   Problem: Nutritional: Goal: Maintenance of adequate nutrition will improve Outcome: Progressing

## 2024-08-10 NOTE — Plan of Care (Signed)
°  Problem: Education: Goal: Ability to describe self-care measures that may prevent or decrease complications (Diabetes Survival Skills Education) will improve 08/10/2024 2028 by Carvin Grad, RN Outcome: Progressing 08/10/2024 2019 by Carvin Grad, RN Outcome: Progressing Goal: Individualized Educational Video(s) 08/10/2024 2028 by Carvin Grad, RN Outcome: Progressing 08/10/2024 2019 by Carvin Grad, RN Outcome: Progressing   Problem: Coping: Goal: Ability to adjust to condition or change in health will improve 08/10/2024 2028 by Carvin Grad, RN Outcome: Progressing 08/10/2024 2019 by Carvin Grad, RN Outcome: Progressing   Problem: Fluid Volume: Goal: Ability to maintain a balanced intake and output will improve 08/10/2024 2028 by Carvin Grad, RN Outcome: Progressing 08/10/2024 2019 by Carvin Grad, RN Outcome: Progressing

## 2024-08-10 NOTE — Progress Notes (Addendum)
 Pt with respiration rate of 37 breath per min.  Dr. Mennie notifed.

## 2024-08-10 NOTE — Progress Notes (Signed)
 PROGRESS NOTE Gilbert Wilson  FMW:[id number] DOB: [birth year] DOA: 08/02/2024 PCP: [name]  Brief Narrative/Hospital Course: Gilbert Wilson is a 78 y.o. male with PMH significant for severe dementia, h/o stroke with right hemiparesis bedbound status for about the past 3 years, total care dependent, h/o renal and liver transplant, seizure disorder, bladder outlet obstruction requiring in and out cath 4 times a day, frequent UTI, DVT and A-fib no longer on AC, osteoarthritis, sacral decubitus and left ankle pressure skin injury. Was hospitalized in April 2025 for 2 weeks for CHF exacerbation and again in May for flareup of left knee osteoarthritis.  He is cared for at home by his wife and daughter. Since about April 2025 he has had persistent and progressing sacral ulcer along with left heel ulcer.  Family endorsed on admission that patient has been noticeably progressively declining both physically and mentally. He has difficulty endorsing pain but has been noted to have pain involving his sacrum from a pressure ulcer. Recent courses of antibiotics for UTI have caused diarrhea which has further created difficulty managing his sacral wound. Due to worsening lethargy, he was brought to the ER for further evaluation. Workup was concerning for sepsis and he was admitted for more workup with sacral ulcer, left heel ulcer, possible perirectal abscess (ruled out on CT). Palliative care also followed.  Essentially treated IV antibiotics and ID recommending 2 weeks oral at discharge.  Tried to discharge but hypernatremia and worsened anemia kept for 1 more night to modify fluids and transfuse 1 unit of blood.  If labs improved on Tuesday, stable for discharge home. Daughter and wife anticipating d/c home Tues as well as long as labs are good. Scripts already sent to [place]  12.8  Subjective: Seen and examined Some congestion, daughter at bedside States -he seems more tired today, just  finished eating and sleeping Overnight vital stable afebrile on room air Labs this morning reviewed sodium down to 149 hemoglobin up at 8.0 more or less stable electrolytes WBC AT 13 and low grade temp at 100.6 this am   Assessment and plan:  Sacral ulcer: He had persistent sacral ulcer and worsening lethargy on admission MRI sacrum shows multiple findings: Soft tissue ulceration over distal sacrum and coccyx with probable small sinus tract extending to bone suspicious for early OM; additional decubitus ulcer and deep sinus tract extending to the right ischial tuberosity Not a surgical candidate given poor functional status, nutritional status, bedbound and no chance of healing wound.  Continue to continue wound care, antibiotics as per ID, general surgery was consulted 12/4. Going local debridement with PT and lieu of hydrotherapy; coverage on daptomycin  and Rocephin  then planning to transition to doxycycline  and Augmentin  for 2 weeks at discharge per ID recommendations; scripts sent to [facility] on 12/8 for anticipated discharge    Perirectal abscess-resolved as of 08/05/2024 MRI sacrum also shows ill-defined right perirectal fluid collection suspicious for intrapelvic abscess IR consulted to evaluate if abscess able to be drained CT pelvis performed on 08/04/2024 and ruled out presence of abscess No further need for IR attempted drainage given CT findings See antibiotic plan under sacral ulcer   Low grade temp: Some cough. Will check CXR and UA, already on antiobics will cont to monitor him.  Hypernatremia suspect from volume depletion poor oral intake sodium nicely improved to 149 will DC IV fluids on discharge encourage oral hydration at home Recent Labs  Lab 08/07/24 0829 08/08/24 0431 08/09/24 0441 08/09/24 1006 08/10/24 0132  NA 152* 154* 150* 154* 149*    Goals of care, counseling/discussion Patient has had progressive decline over the past several months.  Seemingly since April  2025 he has been unable to heal sacral ulcer and has had recurrent infections. For now continuing antibiotics.  Daughter and mother have discussed further and for now wishing to remain aggressive for this hospitalization.  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 and bedbound at baseline Fully dependent on all ADLs    Anemia of chronic disease Baseline hemoglobin appears to be around 8.5 to 10 g/dL At baseline on admission with mild gradual drop; sacral wound has been mildly bleeding at home prior to admission as well which is likely source along with underlying CKD and some probable hemodilution with IVF-Patient does get intermittent Epogen injections outpatient; family wishing to hold off on blood transfusion for now and we will use ESA agent (Aranesp ) for now (given on 12/5) since Hgb 6.3 g/dL on 87/1, S/P 1 unit PRBC and hemoglobin has nicely improved around 8 g now.     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 Baseline creat ~ 1.6 - 1.8, eGFR~ 39-43 Recent Labs    08/02/24 1228 08/03/24 0500 08/04/24 0905 08/05/24 0430 08/06/24 0139 08/07/24 0829 08/08/24 0431 08/09/24 0441 08/09/24 1006 08/10/24 0132  BUN 73* 63* 59* 53* 50* 42* 42* 36* 35* 35*  CREATININE 1.98* 1.79* 1.82* 1.78* 1.61* 1.45* 1.45* 1.43* 1.43* 1.58*  CO2 19* 15* 16* 15* 17* 18* 17* 11* 23 19*  K 4.2 4.2 4.4 4.2 4.2 4.4 4.6 4.2 4.3 4.1     Bladder outlet obstruction chronic urinary retention; does CIC at home by family multiples times daily agree with foley while hospitalized and discussed further with wife on 12/6 and we will plan to continue foley at discharge  for simplicity especially since already getting recurrent UTIs with CIC and going home with palliative care in place or at least [facility] to help change foley monthly. UA not checked on admission-but with antibiotic should cover Continue indwelling foley at discharge as noted above; HHRN to change monthly   Atrial fibrillation  given no plans for procedures, Eliquis  resumed on 12/6 if able (given acute worsening of Hgb)  Rate controlled    Protein-calorie malnutrition, severe Somewhat poor intake at home intermittently. Low albumin  and suspected low prealbumin Also signs of third spacing throughout   Dementia Mood stable; mentation at baseline.  Minimally interactive   Essential hypertension As needed medications for now   Type 2 diabetes mellitus Continue SSI and CBG monitoring  Loose stool: Does not present prior to admission as per the daughter, continue probiotic, antibiotics, currently no abdominal tenderness or leukocytosis.  Continue supportive care Now with pureed type- 2 bm yesterday and 1 today, cont to monitor  DVT prophylaxis: scd Code Status:   Code Status: Full Code Family Communication: plan of care discussed with patient/DAUGHTER at bedside. Patient status is: Remains hospitalized because of severity of illness Level of care: Med-Surg   Dispo: The patient is from: home            Anticipated disposition: Home with family   Objective: Vitals last 24 hrs: Vitals:  08/09/24 2300 08/10/24 0357 08/10/24 0847 08/10/24 0931  BP: (!) 176/63 (!) 159/58  (!) 172/60  Pulse: 68 70  74  Resp:  18    Temp:  98.9 F (37.2 C) (!) 100.4 F (38 C) (!) 100.6 F (38.1 C)  TempSrc:   Oral Oral  SpO2:  97%  96%  Weight:      Height:        Physical Examination: General exam: sleepy HEENT:Oral mucosa moist, Ear/Nose WNL grossly Respiratory system: Bilaterally clear BS,no use of accessory muscle Cardiovascular system: S1 & S2 +, No JVD. Gastrointestinal system: Abdomen  soft,NT,ND, BS+ Nervous System: sleepy Extremities: extremities warm, leg edema + Skin: Warm, no rashes MSK: Normal muscle bulk,tone, power   Medications reviewed:  Scheduled Meds:  amLODipine   5 mg Oral Daily   Chlorhexidine  Gluconate Cloth  6 each Topical Q0600   collagenase    Topical Daily   cycloSPORINE   130 mg Oral BID   darbepoetin (ARANESP ) injection - DIALYSIS  60 mcg Subcutaneous Q Fri-1800   finasteride   5 mg Oral QHS   insulin  aspart  0-5 Units Subcutaneous QHS   insulin  aspart  0-9 Units Subcutaneous TID WC   lactobacillus   Oral TID   levETIRAcetam   750 mg Oral BID   mirtazapine   7.5 mg Oral QHS   mycophenolate   180 mg Oral BID   tamsulosin   0.4 mg Oral Daily   Continuous Infusions:  cefTRIAXone  (ROCEPHIN )  IV 2 g (08/09/24 1640)   DAPTOmycin  700 mg (08/09/24 1314)   dextrose  40 mL/hr at 08/09/24 1311   Diet: Diet Order             DIET - DYS 1 Fluid consistency: Nectar Thick  Diet effective now                    Data Reviewed: I have personally reviewed following labs and imaging studies ( see epic result tab) CBC: Recent Labs  Lab 08/05/24 0430 08/06/24 0139 08/07/24 0829 08/08/24 0431 08/08/24 1859 08/09/24 0441 08/10/24 0132  WBC 9.8 9.0 10.6* 10.8*  --  11.3* 13.9*  NEUTROABS 7.5 6.4 7.8* 8.1*  --  8.6*  --   HGB 6.9* 6.8* 7.0* 6.3* 7.3* 7.3* 8.0*  HCT 22.8* 22.2* 23.3* 21.0* 23.8* 23.2* 25.9*  MCV 80.0 79.3* 79.0* 80.2  --  79.5* 80.7  PLT 416* 426* 510* 446*  --  405* 424*   CMP: Recent Labs  Lab 08/05/24 0430 08/06/24 0139 08/07/24 0829 08/08/24 0431 08/09/24 0441 08/09/24 1006 08/10/24 0132  NA 148* 146* 152* 154* 150* 154* 149*  K 4.2 4.2 4.4 4.6 4.2 4.3 4.1  CL 124* 124* 128* 125* 124* 123* 122*  CO2 15* 17* 18* 17* 11* 23 19*  GLUCOSE 142* 142* 139* 110* 129* 122* 180*  BUN 53* 50* 42* 42* 36* 35* 35*  CREATININE 1.78* 1.61* 1.45* 1.45* 1.43* 1.43* 1.58*  CALCIUM  9.1 9.2 9.1 9.3 9.2 9.8 9.2  MG 1.9 1.9 1.8 1.7 1.8   --   --    GFR: Estimated Creatinine Clearance: 44.2 mL/min (A) (by C-G formula based on SCr of 1.58 mg/dL (H)). No results for input(s): AST, ALT, ALKPHOS, BILITOT, PROT, ALBUMIN  in the last 168 hours.   No results for input(s): LIPASE, AMYLASE in the last 168 hours.  No results for input(s): AMMONIA in the last 168 hours. Coagulation Profile: No results for input(s): INR, PROTIME in the last 168 hours. Unresulted Labs (From admission,  onward)     Start     Ordered   08/10/24 1028  Urinalysis, Routine w reflex microscopic -Urine, Catheterized; Indwelling urinary catheter  Once,   R       Question Answer Comment  Specimen Source Urine, Catheterized   Specimen Source Indwelling urinary catheter      08/10/24 1027   08/05/24 0500  CK  Every 7 days,   R      08/04/24 1856           Antimicrobials/Microbiology: Anti-infectives (From admission, onward)    Start     Dose/Rate Route Frequency Ordered Stop   08/08/24 0000  amoxicillin -clavulanate (AUGMENTIN ) 875-125 MG tablet        1 tablet Oral 2 times daily 08/08/24 1107 08/22/24 2359   08/08/24 0000  doxycycline  (VIBRA -TABS) 100 MG tablet        100 mg Oral 2 times daily 08/08/24 1107 08/22/24 2359   08/05/24 1400  DAPTOmycin  (CUBICIN ) IVPB 700 mg/167mL premix        8 mg/kg  89.8 kg 200 mL/hr over 30 Minutes Intravenous Daily 08/04/24 1855     08/03/24 1800  vancomycin  (VANCOCIN ) IVPB 1000 mg/200 mL premix  Status:  Discontinued        1,000 mg 200 mL/hr over 60 Minutes Intravenous Every 24 hours 08/02/24 1622 08/04/24 1855   08/03/24 1700  cefTRIAXone  (ROCEPHIN ) 2 g in sodium chloride  0.9 % 100 mL IVPB        2 g 200 mL/hr over 30 Minutes Intravenous Every 24 hours 08/02/24 1622     08/02/24 1445  vancomycin  (VANCOREADY) IVPB 1750 mg/350 mL        1,750 mg 175 mL/hr over 120 Minutes Intravenous  Once 08/02/24 1439 08/02/24 2056   08/02/24 1430  cefTRIAXone  (ROCEPHIN ) 2 g in sodium chloride  0.9 % 100 mL  IVPB        2 g 200 mL/hr over 30 Minutes Intravenous  Once 08/02/24 1416 08/02/24 1838         Component Value Date/Time   SDES BLOOD LEFT HAND 08/02/2024 2031   SPECREQUEST  08/02/2024 2031    BOTTLES DRAWN AEROBIC AND ANAEROBIC Blood Culture results may not be optimal due to an inadequate volume of blood received in culture bottles   CULT  08/02/2024 2031    NO GROWTH 5 DAYS Performed at [facility], [address], [place], [place] [address]    REPTSTATUS 08/07/2024 FINAL 08/02/2024 2031    Procedures:    Mennie LAMY, MD [facility] 08/10/2024, 11:00 AM

## 2024-08-11 DIAGNOSIS — Z7189 Other specified counseling: Secondary | ICD-10-CM | POA: Diagnosis not present

## 2024-08-11 DIAGNOSIS — Z515 Encounter for palliative care: Secondary | ICD-10-CM | POA: Diagnosis not present

## 2024-08-11 LAB — BASIC METABOLIC PANEL WITH GFR
Anion gap: 9 (ref 5–15)
BUN: 33 mg/dL — ABNORMAL HIGH (ref 8–23)
CO2: 20 mmol/L — ABNORMAL LOW (ref 22–32)
Calcium: 9.2 mg/dL (ref 8.9–10.3)
Chloride: 117 mmol/L — ABNORMAL HIGH (ref 98–111)
Creatinine, Ser: 1.27 mg/dL — ABNORMAL HIGH (ref 0.61–1.24)
GFR, Estimated: 58 mL/min — ABNORMAL LOW (ref >60)
Glucose, Bld: 139 mg/dL — ABNORMAL HIGH (ref 70–99)
Potassium: 4.3 mmol/L (ref 3.5–5.1)
Sodium: 146 mmol/L — ABNORMAL HIGH (ref 135–145)

## 2024-08-11 LAB — CBC
HCT: 28.7 % — ABNORMAL LOW (ref 39.0–52.0)
Hemoglobin: 8.8 g/dL — ABNORMAL LOW (ref 13.0–17.0)
MCH: 24.7 pg — ABNORMAL LOW (ref 26.0–34.0)
MCHC: 30.7 g/dL (ref 30.0–36.0)
MCV: 80.6 fL (ref 80.0–100.0)
Platelets: 466 K/uL — ABNORMAL HIGH (ref 150–400)
RBC: 3.56 MIL/uL — ABNORMAL LOW (ref 4.22–5.81)
RDW: 17.2 % — ABNORMAL HIGH (ref 11.5–15.5)
WBC: 16.1 K/uL — ABNORMAL HIGH (ref 4.0–10.5)
nRBC: 0 % (ref 0.0–0.2)

## 2024-08-11 LAB — GLUCOSE, CAPILLARY
Glucose-Capillary: 158 mg/dL — ABNORMAL HIGH (ref 70–99)
Glucose-Capillary: 170 mg/dL — ABNORMAL HIGH (ref 70–99)
Glucose-Capillary: 183 mg/dL — ABNORMAL HIGH (ref 70–99)
Glucose-Capillary: 173 mg/dL — ABNORMAL HIGH (ref 70–99)

## 2024-08-11 NOTE — Progress Notes (Signed)
 Physical Therapy Wound Treatment Patient Details  Name: Gilbert Wilson MRN: [id number] Date of Birth: 08-13-1946  Today's Date: 08/11/2024 Time: 1400-1450 Time Calculation (min): 50 min  Subjective  Subjective Assessment Subjective: Pt alert, nodded head to therapist when explaining role of wound therapy. Otherwise not engaging with therapist. Patient and Family Stated Goals: healing wounds Date of Onset:  (unknown) Prior Treatments: Medihoney/Santyl  with dressing change  Pain Score:  Pt was premedicated prior to session. Faces pain scale 2/10.   Wound Assessment  Wound Pressure Injury Ischial tuberosity Right Unstageable - Full thickness tissue loss in which the base of the injury is covered by slough (yellow, tan, gray, green or brown) and/or eschar (tan, brown or black) in the wound bed. (Active)  Site / Wound Assessment Pink;Black;Brown;Yellow 08/11/24 1500  Peri-wound Assessment Intact 08/11/24 1500  Wound Length (cm) 6 cm 08/10/24 0326  Wound Width (cm) 5 cm 08/10/24 0326  Wound Surface Area (cm^2) 23.56 cm^2 08/10/24 0326  Drainage Description No odor;Serosanguineous 08/11/24 1500  Drainage Amount Small 08/11/24 1500  Treatment Debridement (Selective);Irrigation;Packing (Saline gauze) 08/11/24 1500  Dressing Type Foam - Lift dressing to assess site every shift;Gauze (Comment);Santyl ;Barrier Film (skin prep) 08/11/24 1500  Dressing Changed Changed 08/11/24 1500  Dressing Status Clean, Dry, Intact 08/11/24 1500  State of Healing Eschar 08/11/24 1500  % Wound base Red or Granulating 10% 08/11/24 1500  % Wound base Yellow/Fibrinous Exudate 70% 08/11/24 1500  % Wound base Black/Eschar 20% 08/11/24 1500  % Wound base Other/Granulation Tissue (Comment) 0% 08/11/24 1500  Margins Unattached edges (unapproximated) 08/11/24 1500  Non-staged Wound Description Full thickness 08/06/24 2345     Wound 08/02/24 1830 Pressure Injury Sacrum Medial Unstageable - Full thickness tissue loss  in which the base of the injury is covered by slough (yellow, tan, gray, green or brown) and/or eschar (tan, brown or black) in the wound bed. (Active)  Site / Wound Assessment Pink;Yellow;Black 08/11/24 1500  Peri-wound Assessment Denuded;Intact 08/11/24 1500  Wound Length (cm) 7.3 cm 08/05/24 1500  Wound Width (cm) 10.4 cm 08/05/24 1500  Wound Surface Area (cm^2) 59.63 cm^2 08/05/24 1500  Wound Depth (cm) 2.6 cm 08/05/24 1500  Wound Volume (cm^3) 103.354 cm^3 08/05/24 1500  Drainage Description Serosanguineous 08/11/24 1500  Drainage Amount Moderate 08/11/24 1500  Treatment Debridement (Selective);Irrigation;Packing (Saline gauze) 08/11/24 1500  Dressing Type Foam - Lift dressing to assess site every shift;Gauze (Comment);Santyl ;Barrier Film (skin prep) 08/11/24 1500  Dressing Changed Changed 08/11/24 1500  Dressing Status Clean, Dry, Intact 08/11/24 1500  State of Healing Non-healing 08/11/24 1500  % Wound base Red or Granulating 40% 08/11/24 1500  % Wound base Yellow/Fibrinous Exudate 50% 08/11/24 1500  % Wound base Black/Eschar 10% 08/11/24 1500  Margins Unattached edges (unapproximated) 08/11/24 1500  Non-staged Wound Description Full thickness 08/06/24 2345   Selective Debridement (non-excisional) Selective Debridement (non-excisional) - Location: Sacrum, R ischial wound Selective Debridement (non-excisional) - Tools Used: Forceps, Scalpel Selective Debridement (non-excisional) - Tissue Removed: Eschar and yellow necrotic tissue    Wound Assessment and Plan  Wound Therapy - Assess/Plan/Recommendations Wound Therapy - Clinical Statement: Progressing with debridement. Wound beds appear to be improving but overall still a significant amount of necrosis present. This patient will benefit from continued wound therapy for selective removal of non-viable tissue, to decrease bioburden, and promote wound bed healing. Wound Therapy - Functional Problem List: decreased mobility, poor  nutrition, Factors Delaying/Impairing Wound Healing: Multiple medical problems, Immobility, Incontinence, Infection - systemic/local Hydrotherapy Plan: Debridement, Dressing change, Patient/family  education Wound Therapy - Frequency: 2X / week Wound Therapy - Follow Up Recommendations: dressing changes by family/patient  Wound Therapy Goals- Improve the function of patient's integumentary system by progressing the wound(s) through the phases of wound healing (inflammation - proliferation - remodeling) by: Wound Therapy Goals - Improve the function of patient's integumentary system by progressing the wound(s) through the phases of wound healing by: Decrease Necrotic Tissue to: 40 Decrease Necrotic Tissue - Progress: Progressing toward goal Increase Granulation Tissue to: 20 Increase Granulation Tissue - Progress: Progressing toward goal Goals/treatment plan/discharge plan were made with and agreed upon by patient/family: Yes Time For Goal Achievement: 7 days Wound Therapy - Potential for Goals: Fair  Goals will be updated until maximal potential achieved or discharge criteria met.  Discharge criteria: when goals achieved, discharge from hospital, MD decision/surgical intervention, no progress towards goals, refusal/missing three consecutive treatments without notification or medical reason.  GP     Charges PT Wound Care Charges $Wound Debridement up to 20 cm: < or equal to 20 cm $ Wound Debridement each add'l 20 sqcm: 2 $PT Hydrotherapy Visit: 1 Visit       Gilbert JONETTA Wilson 08/11/2024, 3:29 PM  Gilbert Wilson, PT, DPT Acute Rehabilitation Services Secure Chat Preferred Office: [phone]

## 2024-08-11 NOTE — Progress Notes (Signed)
 PROGRESS NOTE [facility]  FMW:[id number] DOB: 1946/02/20 DOA: 08/02/2024 [facility]CP: [name]  Brief Narrative/Hospital Course: Gilbert Wilson is a 78 y.o. male with PMH significant for severe dementia, h/o stroke with right hemiparesis bedbound status for about the past 3 years, total care dependent, h/o renal and liver transplant, seizure disorder, bladder outlet obstruction requiring in and out cath 4 times a day, frequent UTI, DVT and A-fib no longer on AC, osteoarthritis, sacral decubitus and left ankle pressure skin injury. Was hospitalized in April 2025 for 2 weeks for CHF exacerbation and again in May for flareup of left knee osteoarthritis.  He is cared for at home by his wife and daughter. Since about April 2025 he has had persistent and progressing sacral ulcer along with left heel ulcer.  Family endorsed on admission that patient has been noticeably progressively declining both physically and mentally. He has difficulty endorsing pain but has been noted to have pain involving his sacrum from a pressure ulcer. Recent courses of antibiotics for UTI have caused diarrhea which has further created difficulty managing his sacral wound. Due to worsening lethargy, he was brought to the ER for further evaluation. Workup was concerning for sepsis and he was admitted for more workup with sacral ulcer, left heel ulcer, possible perirectal abscess (ruled out on CT). Palliative care also followed.  Essentially treated IV antibiotics and ID recommending 2 weeks oral at discharge.  Tried to discharge but hypernatremia and worsened anemia kept for 1 more night to modify fluids and transfuse 1 unit of blood.  If labs improved on Tuesday, stable for discharge home. Daughter and wife anticipating d/c home Tues as well as long as labs are good. Scripts already sent to [place]  12.8  Subjective: Seen and examined Daughter at the bedside no change in his clinical status ate and slept  well Overnight afebrile x 24 hours, BP slightly on higher side intermittent tachypnea which is his baseline issues per family, placed on low-dose Atarax  for anxiolytic prn. He is on room air Labs shows further improvement in electrolytes sodium 146 creatinine down to 1.2 does have leukocytosis at 16 hemoglobin 8.8   Assessment and plan:  Sacral ulcer: He had persistent sacral ulcer and worsening lethargy on admission MRI sacrum shows multiple findings: Soft tissue ulceration over distal sacrum and coccyx with probable small sinus tract extending to bone suspicious for early OM; additional decubitus ulcer and deep sinus tract extending to the right ischial tuberosity Not a surgical candidate given poor functional status, nutritional status, bedbound and no chance of healing wound.  Continue to continue wound care, antibiotics as per ID, general surgery was consulted 12/4. Going local debridement with PT and lieu of hydrotherapy; Continue with ceftriaxone , daptomycin  with CK monitoring upon discharge transition to doxycycline  and Augmentin  for 2 weeks per ID  Nursing wound care team following see picture below scripts sent to [facility] on 12/8 for anticipated discharge .  But patient remains hospitalized due to ongoing issues with fever and leukocytosis   Perirectal abscess-resolved as of 08/05/2024 MRI sacrum also shows ill-defined right perirectal fluid collection suspicious for intrapelvic abscess IR consulted to evaluate if abscess able to be drained CT pelvis performed on 08/04/2024 and ruled out presence of abscess No further need for IR attempted drainage given CT findings See antibiotic plan for sacral ulcer   Low grade temp Leukocytosis: Some cough.  Chest x-ray and UA unremarkable, has leukocytosis but afebrile x 24 hours continue IV antibiotics as above,  monitor CBC   Hypernatremia suspect from volume depletion poor oral intake sodium nicely improved as below off IV fluids encourage oral  intake Recent Labs  Lab 08/08/24 0431 08/09/24 0441 08/09/24 1006 08/10/24 0132 08/11/24 0441  NA 154* 150* 154* 149* 146*    Goals of care, counseling/discussion Patient has had progressive decline over the past several months.  Seemingly since April 2025 he has been unable to heal sacral ulcer and has had recurrent infections. For now continuing antibiotics.  Daughter and mother have discussed further and for now wishing to remain aggressive for this hospitalization.  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 and bedbound at baseline Fully dependent on all ADLs    Anemia of chronic disease Baseline hemoglobin appears to be around 8.5 to 10 g/dL At baseline on admission with mild gradual drop; sacral wound has been mildly bleeding at home prior to admission as well which is likely source along with underlying CKD and some probable hemodilution with IVF-Patient does get intermittent Epogen injections outpatient; family wishing to hold off on blood transfusion for now and we will use ESA agent (Aranesp ) for now (given on 12/5) since Hgb 6.3 g/dL on 87/1, S/P 1 unit PRBC and hemoglobin has nicely improved around 8 g now.     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 Baseline creat ~ 1.6 - 1.8, eGFR~ 39-43 Recent Labs    08/03/24 0500 08/04/24 0905 08/05/24 0430 08/06/24 0139 08/07/24 0829 08/08/24 0431 08/09/24 0441 08/09/24 1006 08/10/24 0132 08/11/24 0441  BUN 63* 59* 53* 50* 42* 42* 36* 35* 35* 33*  CREATININE 1.79* 1.82* 1.78* 1.61* 1.45* 1.45* 1.43* 1.43* 1.58* 1.27*  CO2 15* 16* 15* 17* 18* 17* 11* 23 19* 20*  K 4.2 4.4 4.2  4.2 4.4 4.6 4.2 4.3 4.1 4.3     Bladder outlet obstruction chronic urinary retention; does CIC at home by family multiples times daily agree with foley while hospitalized and discussed further with wife on 12/6 and we will plan to continue foley at discharge for simplicity especially since already getting recurrent UTIs with CIC and going home with palliative care in place or at least [facility] to help change foley monthly. UA not checked on admission-but with antibiotic should cover Continue indwelling foley at discharge as noted above; HHRN to change monthly   Atrial fibrillation  given no plans for procedures, Eliquis  resumed on 12/6 if able (given acute worsening of Hgb)  Rate controlled    Protein-calorie malnutrition, severe Somewhat poor intake at home intermittently. Low albumin  and suspected low prealbumin Also signs of third spacing throughout   Dementia Mood stable; mentation at baseline.  Minimally interactive   Essential hypertension As needed medications for now   Type 2 diabetes mellitus Continue SSI and CBG monitoring  Loose stool: Does not present prior to admission as per the daughter, continue probiotic, antibiotics, currently no abdominal tenderness or leukocytosis.  Continue supportive care Now with pureed type- 2 bm yesterday and 1 today, cont to monitor  DVT prophylaxis: Place and maintain sequential compression device Start: 08/10/24 [id number] Code Status:   Code Status: Full Code Family Communication: plan of care discussed with patient/DAUGHTER at bedside. Patient  status is: Remains hospitalized because of severity of illness Level of care: Med-Surg   Dispo: The patient is from: home            Anticipated disposition: Home with family   Objective: Vitals last 24 hrs: Vitals:   08/10/24 2058 08/11/24 0041 08/11/24 0529 08/11/24 0752  BP: (!) 157/66 (!) 165/63 (!) 167/68 (!) 170/78  Pulse: 86 80 88 87  Resp: (!) 28 (!) 29  16  Temp: 98.5 F (36.9 C) 99.6  F (37.6 C) 100 F (37.8 C) 97.6 F (36.4 C)  TempSrc: Oral Oral Oral Oral  SpO2: 95% 97% 95% 94%  Weight:      Height:        Physical Examination: General exam: sleepy, able to wake up and open his eyes HEENT:Oral mucosa moist, Ear/Nose WNL grossly Respiratory system: Bilaterally clear but diminished at bases,no use of accessory muscle Cardiovascular system: S1 & S2 +, No JVD. Gastrointestinal system: Abdomen soft,NT,ND, BS+ Nervous System: sleepy Extremities: extremities warm, leg edema + Skin: Warm, no rashes MSK: Normal muscle bulk,tone, power .  Medications reviewed:  Scheduled Meds:  amLODipine   5 mg Oral Daily   Chlorhexidine  Gluconate Cloth  6 each Topical Q0600   collagenase    Topical Daily   cycloSPORINE   130 mg Oral BID   darbepoetin (ARANESP ) injection - DIALYSIS  60 mcg Subcutaneous Q Fri-1800   finasteride   5 mg Oral QHS   insulin  aspart  0-5 Units Subcutaneous QHS   insulin  aspart  0-9 Units Subcutaneous TID WC   lactobacillus   Oral TID   levETIRAcetam   750 mg Oral BID   mirtazapine   7.5 mg Oral QHS   mycophenolate   180 mg Oral BID   tamsulosin   0.4 mg Oral Daily   Continuous Infusions:  cefTRIAXone  (ROCEPHIN )  IV 2 g (08/10/24 1727)   DAPTOmycin  700 mg (08/10/24 1353)   Diet: Diet Order             DIET - DYS 1 Fluid consistency: Nectar Thick  Diet effective now                    Data Reviewed: I have personally reviewed following labs and imaging studies ( see epic result tab) CBC: Recent Labs  Lab 08/05/24 0430 08/06/24 0139 08/07/24 0829 08/08/24 0431 08/08/24 1859 08/09/24 0441 08/10/24 0132 08/11/24 0441  WBC 9.8 9.0 10.6* 10.8*  --  11.3* 13.9* 16.1*  NEUTROABS 7.5 6.4 7.8* 8.1*  --  8.6*  --   --   HGB 6.9* 6.8* 7.0* 6.3* 7.3* 7.3* 8.0* 8.8*  HCT 22.8* 22.2* 23.3* 21.0* 23.8* 23.2* 25.9* 28.7*  MCV 80.0 79.3* 79.0* 80.2  --  79.5* 80.7 80.6  PLT 416* 426* 510* 446*  --  405* 424* 466*   CMP: Recent Labs  Lab  08/05/24 0430 08/06/24 0139 08/07/24 0829 08/08/24 0431 08/09/24 0441 08/09/24 1006 08/10/24 0132 08/11/24 0441  NA 148* 146* 152* 154* 150* 154* 149* 146*  K 4.2 4.2 4.4 4.6 4.2 4.3 4.1 4.3  CL 124* 124* 128* 125* 124* 123* 122* 117*  CO2 15* 17* 18* 17* 11* 23 19* 20*  GLUCOSE 142* 142* 139* 110* 129* 122* 180* 139*  BUN 53* 50* 42* 42* 36* 35* 35* 33*  CREATININE 1.78* 1.61* 1.45* 1.45* 1.43* 1.43* 1.58* 1.27*  CALCIUM  9.1 9.2 9.1 9.3 9.2 9.8 9.2 9.2  MG 1.9 1.9 1.8 1.7 1.8  --   --   --  GFR: Estimated Creatinine Clearance: 55 mL/min (A) (by C-G formula based on SCr of 1.27 mg/dL (H)). No results for input(s): AST, ALT, ALKPHOS, BILITOT, PROT, ALBUMIN  in the last 168 hours.   No results for input(s): LIPASE, AMYLASE in the last 168 hours.  No results for input(s): AMMONIA in the last 168 hours. Coagulation Profile: No results for input(s): INR, PROTIME in the last 168 hours. Unresulted Labs (From admission, onward)     Start     Ordered   08/05/24 0500  CK  Every 7 days,   R      08/04/24 1856           Antimicrobials/Microbiology: Anti-infectives (From admission, onward)    Start     Dose/Rate Route Frequency Ordered Stop   08/08/24 0000  amoxicillin -clavulanate (AUGMENTIN ) 875-125 MG tablet        1 tablet Oral 2 times daily 08/08/24 1107 08/22/24 2359   08/08/24 0000  doxycycline  (VIBRA -TABS) 100 MG tablet        100 mg Oral 2 times daily 08/08/24 1107 08/22/24 2359   08/05/24 1400  DAPTOmycin  (CUBICIN ) IVPB 700 mg/135mL premix        8 mg/kg  89.8 kg 200 mL/hr over 30 Minutes Intravenous Daily 08/04/24 1855     08/03/24 1800  vancomycin  (VANCOCIN ) IVPB 1000 mg/200 mL premix  Status:  Discontinued        1,000 mg 200 mL/hr over 60 Minutes Intravenous Every 24 hours 08/02/24 1622 08/04/24 1855   08/03/24 1700  cefTRIAXone  (ROCEPHIN ) 2 g in sodium chloride  0.9 % 100 mL IVPB        2 g 200 mL/hr over 30 Minutes Intravenous Every 24 hours  08/02/24 1622     08/02/24 1445  vancomycin  (VANCOREADY) IVPB 1750 mg/350 mL        1,750 mg 175 mL/hr over 120 Minutes Intravenous  Once 08/02/24 1439 08/02/24 2056   08/02/24 1430  cefTRIAXone  (ROCEPHIN ) 2 g in sodium chloride  0.9 % 100 mL IVPB        2 g 200 mL/hr over 30 Minutes Intravenous  Once 08/02/24 1416 08/02/24 1838         Component Value Date/Time   SDES BLOOD LEFT HAND 08/10/2024 1915   SDES BLOOD SITE NOT SPECIFIED 08/10/2024 1915   SPECREQUEST  08/10/2024 1915    BOTTLES DRAWN AEROBIC ONLY Blood Culture results may not be optimal due to an inadequate volume of blood received in culture bottles   SPECREQUEST  08/10/2024 1915    BOTTLES DRAWN AEROBIC ONLY Blood Culture results may not be optimal due to an inadequate volume of blood received in culture bottles   CULT  08/10/2024 1915    NO GROWTH < 12 HOURS Performed at [facility], [address] [place], [place] [address]    CULT  08/10/2024 1915    NO GROWTH < 12 HOURS Performed at [facility], [address], [place], [place] [address]    REPTSTATUS PENDING 08/10/2024 1915   REPTSTATUS PENDING 08/10/2024 1915    Procedures:    Mennie LAMY, MD [facility] 08/11/2024, 1:49 PM

## 2024-08-11 NOTE — Progress Notes (Signed)
 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 male  with past medical history of dementia, prior CVA with right side hemiplegia, liver failure s/p transplant, ESRD s/p renal transplant, seizure disorder admitted on 08/02/2024 with lethargy and leukocytosis.   Per H&P on 12/2 by Dahal MD, patient has been physically and cognitively been declining after discussion with family. Patient has had more diarrhea 2/2 antibiotics for recurrent UTIs resulting in contamination of a sacral decubitus ulcer. Sources of infection include urine and sacral ulcer given history of recurrent UTI as well as increased diarrhea which increases likelihood of contamination of wound.    CXR 08/02/2024 demonstrates hypoinflation without acute findings concerning for pneumonia. X-ray 08/02/2024 of left foot with heel pressure injury does not suggest osteomyelitis. MRI of pelvis on 08/02/2024 demonstrated small sinus tract extending to sacrum and coccyx that is suspicious for osteomyelitis as well as intrapelvic abscess. Media review of sacral ulcer and left heel pressure injury on 08/02/2024 shows significant deterioration compared to prior images taken on 01/05/2024. Leukocytosis without fever but with possible sources of infection including sacral ulcer and  intrapelvic abscess.    Palliative medicine consulted for goals of care conversation.   SUMMARY OF RECOMMENDATIONS Full code, full scope, aware of limited options for interventions Continue current management Family working on bringing in [place] and AD, MOST form provided for discussion with family Symptom management as below  Symptom Management:  Dilaudid  1-2 mg PO prn for pain  Code Status: Full Code  Prognosis: Unable to determine  Discharge Planning: To Be Determined  Discussed with: [place] Christobal MD about family's continued expression of full code and full scope, no change in goals. Family continues to work on finding HCPOA and AD.   Subjective:   Subjective: Chart Reviewed. Updates received. Patient Assessed. Created space and opportunity for patient  and family to explore thoughts and feelings regarding current medical situation.  Today's Discussion:  Met with patient at the bedside with daughter feeding him breakfast. Daughter shares that they are still working on finding AD and HCPOA documentation, but knows that [place] is patient's wife. MOST form provided and family aware they do not need to complete the form but to use it as a guide for future consideration if patient deteriorates to a level that is not acceptable to patient or family goals.   Daughter shares that dilaudid  provided for pain yesterday seemed to be adequate. Plan for PT to work on some wound care today and plan for pre-emptive medication with dilaudid  to better tolerate therapy.   Review of Systems  Unable to perform ROS   Objective:   Primary Diagnoses: Present on Admission:  Sacral ulcer (HCC)  Essential hypertension  Dementia (HCC)  Atrial fibrillation (HCC)  Protein-calorie malnutrition, severe  Bladder outlet obstruction  Anemia of chronic disease   Vital Signs:  BP (!) 170/78 (BP Location: Left Arm)   Pulse 87   Temp 97.6 F (36.4 C) (Oral)   Resp 16   Ht 6' 1 (1.854 m)   Wt 89.8 kg    SpO2 94%   BMI 26.12 kg/m   Physical Exam HENT:     Head: Normocephalic.     Nose: Nose normal.     Mouth/Throat:     Mouth: Mucous membranes are moist.  Eyes:     Extraocular Movements: Extraocular movements intact.  Cardiovascular:     Rate and Rhythm: Normal rate.     Pulses: Normal pulses.  Pulmonary:     Effort: Pulmonary effort is normal.  Abdominal:     Palpations: Abdomen is soft.  Skin:    General: Skin is warm.  Neurological:     Mental Status: He is alert. Mental status is at baseline.     Palliative Assessment/Data: 30%   Existing Vynca/ACP Documentation: None documented, family continues to work on providing AD and HCPOA  Thank you for allowing us  to participate in the care of Gilbert Wilson PMT will continue to support holistically. I will see patient on 08/15/2024 if still admitted.   I personally spent a total of 25 minutes in the care of the patient today including preparing to see the patient, performing a medically appropriate exam/evaluation, counseling and educating, referring and communicating with other health care professionals, and documenting clinical information in the EHR.   Fairy FORBES Shan DEVONNA  Palliative Medicine Team  Team Phone # [phone] (Nights/Weekends) 08/11/2024 9:43 AM

## 2024-08-12 LAB — BPAM RBC
Blood Product Expiration Date: 202512312359
Unit Type and Rh: 5100

## 2024-08-12 LAB — CBC
HCT: 30.2 % — ABNORMAL LOW (ref 39.0–52.0)
Hemoglobin: 9.2 g/dL — ABNORMAL LOW (ref 13.0–17.0)
MCH: 24.9 pg — ABNORMAL LOW (ref 26.0–34.0)
MCHC: 30.5 g/dL (ref 30.0–36.0)
MCV: 81.6 fL (ref 80.0–100.0)
Platelets: 396 K/uL (ref 150–400)
RBC: 3.7 MIL/uL — ABNORMAL LOW (ref 4.22–5.81)
RDW: 17.6 % — ABNORMAL HIGH (ref 11.5–15.5)
WBC: 15.7 K/uL — ABNORMAL HIGH (ref 4.0–10.5)
nRBC: 0 % (ref 0.0–0.2)

## 2024-08-12 LAB — BASIC METABOLIC PANEL WITH GFR
Anion gap: 10 (ref 5–15)
BUN: 33 mg/dL — ABNORMAL HIGH (ref 8–23)
CO2: 18 mmol/L — ABNORMAL LOW (ref 22–32)
Calcium: 9.3 mg/dL (ref 8.9–10.3)
Chloride: 120 mmol/L — ABNORMAL HIGH (ref 98–111)
Creatinine, Ser: 1.41 mg/dL — ABNORMAL HIGH (ref 0.61–1.24)
GFR, Estimated: 51 mL/min — ABNORMAL LOW (ref >60)
Glucose, Bld: 156 mg/dL — ABNORMAL HIGH (ref 70–99)
Potassium: 4.6 mmol/L (ref 3.5–5.1)
Sodium: 148 mmol/L — ABNORMAL HIGH (ref 135–145)

## 2024-08-12 LAB — GLUCOSE, CAPILLARY
Glucose-Capillary: 157 mg/dL — ABNORMAL HIGH (ref 70–99)
Glucose-Capillary: 162 mg/dL — ABNORMAL HIGH (ref 70–99)
Glucose-Capillary: 132 mg/dL — ABNORMAL HIGH (ref 70–99)
Glucose-Capillary: 243 mg/dL — ABNORMAL HIGH (ref 70–99)

## 2024-08-12 LAB — TYPE AND SCREEN
ABO/RH(D): O POS
Antibody Screen: NEGATIVE
Unit division: 0

## 2024-08-12 LAB — CK: Total CK: 1776 U/L — ABNORMAL HIGH (ref 49–397)

## 2024-08-12 MED ORDER — JUVEN PO PACK
1.0000 | PACK | Freq: Two times a day (BID) | ORAL | Status: DC
  Administered 2024-08-12 – 2024-08-13 (×2): 1 via ORAL
  Filled 2024-08-12 (×2): qty 1

## 2024-08-12 MED ORDER — FERROUS SULFATE 325 (65 FE) MG PO TABS
325.0000 mg | ORAL_TABLET | Freq: Every day | ORAL | Status: DC
  Administered 2024-08-13: 325 mg via ORAL
  Filled 2024-08-12: qty 1

## 2024-08-12 MED ORDER — SODIUM BICARBONATE 650 MG PO TABS
650.0000 mg | ORAL_TABLET | Freq: Two times a day (BID) | ORAL | Status: DC
  Administered 2024-08-12 – 2024-08-13 (×3): 650 mg via ORAL
  Filled 2024-08-12 (×3): qty 1

## 2024-08-12 MED ORDER — DEXTROSE 5 % IV SOLN: INTRAVENOUS | Status: AC

## 2024-08-12 MED ORDER — DOXYCYCLINE HYCLATE 100 MG PO TABS
100.0000 mg | ORAL_TABLET | Freq: Two times a day (BID) | ORAL | Status: DC
  Administered 2024-08-12 – 2024-08-13 (×3): 100 mg via ORAL
  Filled 2024-08-12 (×3): qty 1

## 2024-08-12 MED ORDER — PANTOPRAZOLE SODIUM 40 MG PO TBEC
40.0000 mg | DELAYED_RELEASE_TABLET | Freq: Every day | ORAL | Status: DC
  Administered 2024-08-12 – 2024-08-13 (×2): 40 mg via ORAL
  Filled 2024-08-12 (×2): qty 1

## 2024-08-12 MED ORDER — APIXABAN 5 MG PO TABS
5.0000 mg | ORAL_TABLET | Freq: Two times a day (BID) | ORAL | Status: DC
  Administered 2024-08-12 – 2024-08-13 (×3): 5 mg via ORAL
  Filled 2024-08-12 (×3): qty 1

## 2024-08-12 NOTE — Progress Notes (Signed)
 PROGRESS NOTE Gilbert Wilson  FMW:[id number] DOB: 12/11/1945 DOA: 08/02/2024 PCP: [name]  Brief Narrative/Hospital Course: Gilbert Wilson is a 78 y.o. male with PMH significant for severe dementia, h/o stroke with right hemiparesis bedbound status for about the past 3 years, total care dependent, h/o renal and liver transplant, seizure disorder, bladder outlet obstruction requiring in and out cath 4 times a day, frequent UTI, DVT and A-fib no longer on AC, osteoarthritis, sacral decubitus and left ankle pressure skin injury. Was hospitalized in April 2025 for 2 weeks for CHF exacerbation and again in May for flareup of left knee osteoarthritis.  He is cared for at home by his wife and daughter. Since about April 2025 he has had persistent and progressing sacral ulcer along with left heel ulcer.  Family endorsed on admission that patient has been noticeably progressively declining both physically and mentally. He has difficulty endorsing pain but has been noted to have pain involving his sacrum from a pressure ulcer. Recent courses of antibiotics for UTI have caused diarrhea which has further created difficulty managing his sacral wound. Due to worsening lethargy, he was brought to the ER for further evaluation. Workup was concerning for sepsis and he was admitted for more workup with sacral ulcer, left heel ulcer, possible perirectal abscess (ruled out on CT). Palliative care also followed.  Essentially treated IV antibiotics and ID recommending 2 weeks oral at discharge.  Tried to discharge but hypernatremia and worsened anemia kept for 1 more night to modify fluids and transfuse 1 unit of blood.  If labs improved on Tuesday, stable for discharge home. Daughter and wife anticipating d/c home Tues as well as long as labs are good. Scripts already sent to [facility]  12.8  Subjective: Seen and examined BM soft no more diarrhea. More alert awake Intermittent tachypnea present-but not  new Overnight patient has been afebrile no fever recurrence, Labs with creatinine 1.4 more or less same WBC downtrending 15.7 CK up to 1776  Daughter at the bedside wife on the phone  Assessment and plan:  Sacral ulcer: He had persistent sacral ulcer and worsening lethargy on admission. MRI sacrum shows multiple findings: Soft tissue ulceration over distal sacrum and coccyx with probable small sinus tract extending to bone suspicious for early OM; additional decubitus ulcer and deep sinus tract extending to the right ischial tuberosity Not a surgical candidate given poor functional status, nutritional status, bedbound and no chance of healing wound.  Continue to continue wound care, antibiotics as per ID, general surgery was consulted 12/4.  Advised local debridement/hydrotherapy wound care as per PT ,WOCN On ceftriaxone , daptomycin  with CK monitoring-CK is rising-ID recommending to switch daptomycin  to doxycycline , continue ceftriaxone   Monitor CBC, plan is to complete total 2 weeks of antibiotics  Lethargy Intermittent tachypnea Dementia at baseline minimally interactive: Patient usually is lethargic and sleepy after eating, he does have intermittent tachypnea which he gets at home. Chest x-ray was done and unremarkable 2 days ago, continue anxiolytics, supportive care monitor for any signs of infection  Perirectal abscess-resolved as of 08/05/2024 MRI sacrum also shows ill-defined right perirectal fluid collection suspicious for intrapelvic abscess IR consulted to evaluate if abscess able to be drained CT pelvis performed on 08/04/2024 and ruled out presence of abscess No further need for IR attempted drainage given CT findings See antibiotic plan for sacral ulcer   Low grade temp-resolved Leukocytosis: Some cough.  Chest x-ray and UA unremarkable, has leukocytosis but afebrile now and WBC downtrending, on  antibiotics as above.  Monitor CBC -WBC slightly improving Recent Labs  Lab  08/08/24 0431 08/09/24 0441 08/10/24 0132 08/11/24 0441 08/12/24 0559  WBC 10.8* 11.3* 13.9* 16.1* 15.7*    Hypernatremia suspect from volume depletion poor oral intake sodium nicely improving  from peak of 154sas below. Off IVF, cont to encourage oral intake  Protein-calorie malnutrition, severe Somewhat poor intake at home intermittently. Low albumin  and suspected low prealbumin Also signs of third spacing throughout   Goals of care, counseling/discussion Patient has had progressive decline over the past several months.  Seemingly since April 2025 he has been unable to heal sacral ulcer and has had recurrent infections. For now continuing antibiotics.  Daughter and mother have discussed further and for now wishing to remain aggressive for this hospitalization.  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 and bedbound at baseline Fully dependent on all ADLs    Anemia of chronic disease Baseline hemoglobin appears to be around 8.5 to 10 g/dL At baseline on admission with mild gradual drop; sacral wound has been mildly bleeding at home prior to admission as well which is likely source along with underlying CKD and some probable hemodilution with IVF-Patient does get intermittent Epogen injections outpatient; family wishing to hold off on blood transfusion for now and we will use ESA agent (Aranesp ) for now (given on 12/5) since Hgb 6.3 g/dL on 87/1, S/P 1 unit PRBC and hemoglobin has nicely improved around 8 g now.     Liver transplant recipient: History of renal transplant Continue cyclosporine  and  mycophenolate    Chronic kidney disease, stage 3b Metabolic acidosis: patient has history of CKD3b. Baseline creat ~ 1.6 - 1.8, eGFR~ 39-43.  Resume home oral bicarb Recent Labs    08/04/24 0905 08/05/24 0430 08/06/24 0139 08/07/24 0829 08/08/24 0431 08/09/24 0441 08/09/24 1006 08/10/24 0132 08/11/24 0441 08/12/24 0559  BUN 59* 53* 50* 42* 42* 36* 35* 35* 33* 33*  CREATININE 1.82* 1.78* 1.61* 1.45* 1.45* 1.43* 1.43* 1.58* 1.27* 1.41*  CO2 16* 15* 17* 18* 17* 11* 23 19* 20* 18*  K 4.4 4.2 4.2 4.4 4.6 4.2 4.3 4.1 4.3 4.6   Mild rhabdomyolysis: Discontinued daptomycin .  Recheck in a.m., add gentle IVF overnight.   Bladder outlet obstruction chronic urinary retention; does CIC at home by family multiples times daily agree with foley while hospitalized and discussed further with wife on 12/6 and we will plan to continue foley at discharge for simplicity especially since already getting recurrent UTIs with CIC and going home with palliative care in place or at least [facility] to help change foley monthly. UA not checked on admission-but with antibiotic should cover Continue indwelling foley at discharge as noted above; HHRN to change monthly   Atrial fibrillation  Eliquis  will resumed  as HB is stable  Essential hypertension As needed medications for now   Type 2 diabetes mellitus Continue SSI and CBG monitoring  Loose stool: appears more soft now.  Monitor   DVT prophylaxis: Place and maintain sequential compression device Start: 08/10/24 1354 holding chemical prophylaxis due to severe anemia up to 6.3 Hemoglobin stable will resume eliquis  Code Status:  Code Status: Full Code Family Communication: plan of care discussed with patient/DAUGHTER at bedside again. Patient status is: Remains hospitalized because of severity of illness Level of care: Med-Surg   Dispo: The patient is from: home            Anticipated disposition: Home with family   Objective: Vitals last 24  hrs: Vitals:   08/11/24 2106 08/12/24 0543 08/12/24 0644 08/12/24 0957  BP: (!) 155/68 (!) 176/69 (!) 158/66 (!) 160/71  Pulse: 88 92 93 87  Resp: 19 20 19 16   Temp: 98 F (36.7 C) 99.8 F (37.7 C) 98.4 F (36.9 C) 100 F (37.8 C)  TempSrc: Oral Oral Oral Oral  SpO2: 94% 93% 94% 91%  Weight:      Height:        Physical Examination: General exam: Alert awake tachypneic HEENT:Oral mucosa moist, Ear/Nose WNL grossly Respiratory system: CTA bilaterally, tachypneic Cardiovascular system: S1 & S2 +, No JVD. Gastrointestinal system: Abdomen soft,NT,ND, BS+ Nervous System: Alert awake Extremities: extremities warm, leg edema + Skin: Warm, no rashes MSK: thin muscle bulk,tone, power .  Medications reviewed:  Scheduled Meds:  amLODipine   5 mg Oral Daily   apixaban   5 mg Oral BID   Chlorhexidine  Gluconate Cloth  6 each Topical Q0600   collagenase    Topical Daily   cycloSPORINE   130 mg Oral BID   darbepoetin (ARANESP ) injection - DIALYSIS  60 mcg Subcutaneous Q Fri-1800   doxycycline   100 mg Oral BID   [START ON 08/13/2024] ferrous sulfate   325 mg Oral Q breakfast   finasteride   5 mg Oral QHS   insulin  aspart  0-5 Units Subcutaneous QHS   insulin  aspart  0-9 Units Subcutaneous TID WC   lactobacillus   Oral TID   levETIRAcetam   750 mg Oral BID   mirtazapine   7.5 mg Oral QHS   mycophenolate   180 mg Oral BID   nutrition supplement (JUVEN)  1 packet Oral BID BM   pantoprazole   40 mg Oral Daily   sodium bicarbonate   650 mg Oral BID   tamsulosin   0.4 mg Oral Daily   Continuous Infusions:  cefTRIAXone  (ROCEPHIN )  IV 2 g (08/11/24 1730)   dextrose      Diet: Diet Order             DIET - DYS 1 Fluid consistency: Nectar Thick  Diet effective now                    Data Reviewed: I have personally reviewed following labs and imaging studies ( see epic result tab) CBC: Recent Labs  Lab 08/06/24 0139 08/07/24 0829 08/08/24 0431 08/08/24 1859 08/09/24 0441  08/10/24 0132 08/11/24 0441 08/12/24 0559  WBC 9.0 10.6* 10.8*  --  11.3* 13.9* 16.1* 15.7*  NEUTROABS 6.4 7.8* 8.1*  --  8.6*  --   --   --   HGB 6.8* 7.0* 6.3* 7.3* 7.3* 8.0* 8.8* 9.2*  HCT 22.2* 23.3* 21.0* 23.8* 23.2* 25.9* 28.7* 30.2*  MCV 79.3* 79.0* 80.2  --  79.5* 80.7 80.6 81.6  PLT 426* 510* 446*  --  405* 424* 466* 396   CMP: Recent Labs  Lab 08/06/24 0139 08/07/24 0829 08/08/24 0431 08/09/24 0441 08/09/24 1006 08/10/24 0132 08/11/24 0441 08/12/24 0559  NA 146* 152* 154* 150* 154* 149* 146* 148*  K 4.2 4.4 4.6 4.2 4.3 4.1 4.3 4.6  CL 124* 128* 125* 124* 123* 122* 117* 120*  CO2 17* 18* 17*  11* 23 19* 20* 18*  GLUCOSE 142* 139* 110* 129* 122* 180* 139* 156*  BUN 50* 42* 42* 36* 35* 35* 33* 33*  CREATININE 1.61* 1.45* 1.45* 1.43* 1.43* 1.58* 1.27* 1.41*  CALCIUM  9.2 9.1 9.3 9.2 9.8 9.2 9.2 9.3  MG 1.9 1.8 1.7 1.8  --   --   --   --    GFR: Estimated Creatinine Clearance: 49.6 mL/min (A) (by C-G formula based on SCr of 1.41 mg/dL (H)). No results for input(s): AST, ALT, ALKPHOS, BILITOT, PROT, ALBUMIN  in the last 168 hours.   No results for input(s): LIPASE, AMYLASE in the last 168 hours.  No results for input(s): AMMONIA in the last 168 hours. Coagulation Profile: No results for input(s): INR, PROTIME in the last 168 hours. Unresulted Labs (From admission, onward)     Start     Ordered   08/13/24 0500  CK  Tomorrow morning,   R       Question:  Specimen collection method  Answer:  Lab=Lab collect   08/12/24 1123   08/13/24 0500  Basic metabolic panel with GFR  Tomorrow morning,   R       Question:  Specimen collection method  Answer:  Lab=Lab collect   08/12/24 1123   08/13/24 0500  CBC  Tomorrow morning,   R       Question:  Specimen collection method  Answer:  Lab=Lab collect   08/12/24 1123           Antimicrobials/Microbiology: Anti-infectives (From admission, onward)    Start     Dose/Rate Route Frequency Ordered Stop    08/12/24 1000  doxycycline  (VIBRA -TABS) tablet 100 mg        100 mg Oral 2 times daily 08/12/24 0919     08/08/24 0000  amoxicillin -clavulanate (AUGMENTIN ) 875-125 MG tablet        1 tablet Oral 2 times daily 08/08/24 1107 08/22/24 2359   08/08/24 0000  doxycycline  (VIBRA -TABS) 100 MG tablet        100 mg Oral 2 times daily 08/08/24 1107 08/22/24 2359   08/05/24 1400  DAPTOmycin  (CUBICIN ) IVPB 700 mg/169mL premix  Status:  Discontinued        8 mg/kg  89.8 kg 200 mL/hr over 30 Minutes Intravenous Daily 08/04/24 1855 08/12/24 0918   08/03/24 1800  vancomycin  (VANCOCIN ) IVPB 1000 mg/200 mL premix  Status:  Discontinued        1,000 mg 200 mL/hr over 60 Minutes Intravenous Every 24 hours 08/02/24 1622 08/04/24 1855   08/03/24 1700  cefTRIAXone  (ROCEPHIN ) 2 g in sodium chloride  0.9 % 100 mL IVPB        2 g 200 mL/hr over 30 Minutes Intravenous Every 24 hours 08/02/24 1622     08/02/24 1445  vancomycin  (VANCOREADY) IVPB 1750 mg/350 mL        1,750 mg 175 mL/hr over 120 Minutes Intravenous  Once 08/02/24 1439 08/02/24 2056   08/02/24 1430  cefTRIAXone  (ROCEPHIN ) 2 g in sodium chloride  0.9 % 100 mL IVPB        2 g 200 mL/hr over 30 Minutes Intravenous  Once 08/02/24 1416 08/02/24 1838         Component Value Date/Time   SDES BLOOD LEFT HAND 08/10/2024 1915   SDES BLOOD SITE NOT SPECIFIED 08/10/2024 1915   SPECREQUEST  08/10/2024 1915    BOTTLES DRAWN AEROBIC ONLY Blood Culture results may not be optimal due to an inadequate volume of blood received  in culture bottles   SPECREQUEST  08/10/2024 1915    BOTTLES DRAWN AEROBIC ONLY Blood Culture results may not be optimal due to an inadequate volume of blood received in culture bottles   CULT  08/10/2024 1915    NO GROWTH 2 DAYS Performed at [facility], [address], [place], [place] [address]    CULT  08/10/2024 1915    NO GROWTH 2 DAYS Performed at [facility], [address] [place], [place] [address]    REPTSTATUS  PENDING 08/10/2024 1915   REPTSTATUS PENDING 08/10/2024 1915    Procedures:    Mennie LAMY, MD [facility] 08/12/2024, 12:05 PM

## 2024-08-12 NOTE — Plan of Care (Signed)
°  Problem: Fluid Volume: Goal: Ability to maintain a balanced intake and output will improve Outcome: Progressing   Problem: Metabolic: Goal: Ability to maintain appropriate glucose levels will improve Outcome: Progressing   Problem: Education: Goal: Knowledge of General Education information will improve Description: Including pain rating scale, medication(s)/side effects and non-pharmacologic comfort measures Outcome: Progressing   Problem: Clinical Measurements: Goal: Ability to maintain clinical measurements within normal limits will improve Outcome: Progressing   Problem: Activity: Goal: Risk for activity intolerance will decrease Outcome: Progressing   Problem: Nutrition: Goal: Adequate nutrition will be maintained Outcome: Progressing   Problem: Pain Managment: Goal: General experience of comfort will improve and/or be controlled Outcome: Progressing   Problem: Safety: Goal: Ability to remain free from injury will improve Outcome: Progressing   Problem: Skin Integrity: Goal: Risk for impaired skin integrity will decrease Outcome: Progressing

## 2024-08-13 ENCOUNTER — Other Ambulatory Visit (HOSPITAL_COMMUNITY): Payer: Self-pay

## 2024-08-13 DIAGNOSIS — E1122 Type 2 diabetes mellitus with diabetic chronic kidney disease: Secondary | ICD-10-CM

## 2024-08-13 DIAGNOSIS — E870 Hyperosmolality and hypernatremia: Secondary | ICD-10-CM

## 2024-08-13 DIAGNOSIS — F0390 Unspecified dementia without behavioral disturbance: Secondary | ICD-10-CM

## 2024-08-13 DIAGNOSIS — I129 Hypertensive chronic kidney disease with stage 1 through stage 4 chronic kidney disease, or unspecified chronic kidney disease: Secondary | ICD-10-CM

## 2024-08-13 DIAGNOSIS — D638 Anemia in other chronic diseases classified elsewhere: Secondary | ICD-10-CM

## 2024-08-13 DIAGNOSIS — L98429 Non-pressure chronic ulcer of back with unspecified severity: Secondary | ICD-10-CM | POA: Diagnosis not present

## 2024-08-13 DIAGNOSIS — E43 Unspecified severe protein-calorie malnutrition: Secondary | ICD-10-CM

## 2024-08-13 DIAGNOSIS — L89159 Pressure ulcer of sacral region, unspecified stage: Secondary | ICD-10-CM

## 2024-08-13 DIAGNOSIS — Z515 Encounter for palliative care: Secondary | ICD-10-CM

## 2024-08-13 DIAGNOSIS — I4891 Unspecified atrial fibrillation: Secondary | ICD-10-CM

## 2024-08-13 DIAGNOSIS — L89620 Pressure ulcer of left heel, unstageable: Secondary | ICD-10-CM

## 2024-08-13 DIAGNOSIS — N1832 Chronic kidney disease, stage 3b: Secondary | ICD-10-CM

## 2024-08-13 LAB — CK: Total CK: 1264 U/L — ABNORMAL HIGH (ref 49–397)

## 2024-08-13 LAB — BASIC METABOLIC PANEL WITH GFR
Anion gap: 6 (ref 5–15)
BUN: 38 mg/dL — ABNORMAL HIGH (ref 8–23)
CO2: 21 mmol/L — ABNORMAL LOW (ref 22–32)
Calcium: 9.5 mg/dL (ref 8.9–10.3)
Chloride: 121 mmol/L — ABNORMAL HIGH (ref 98–111)
Creatinine, Ser: 1.45 mg/dL — ABNORMAL HIGH (ref 0.61–1.24)
GFR, Estimated: 50 mL/min — ABNORMAL LOW (ref >60)
Glucose, Bld: 185 mg/dL — ABNORMAL HIGH (ref 70–99)
Potassium: 4.8 mmol/L (ref 3.5–5.1)
Sodium: 148 mmol/L — ABNORMAL HIGH (ref 135–145)

## 2024-08-13 LAB — CBC
HCT: 29.6 % — ABNORMAL LOW (ref 39.0–52.0)
Hemoglobin: 9.2 g/dL — ABNORMAL LOW (ref 13.0–17.0)
MCH: 25.1 pg — ABNORMAL LOW (ref 26.0–34.0)
MCHC: 31.1 g/dL (ref 30.0–36.0)
MCV: 80.7 fL (ref 80.0–100.0)
Platelets: 416 K/uL — ABNORMAL HIGH (ref 150–400)
RBC: 3.67 MIL/uL — ABNORMAL LOW (ref 4.22–5.81)
RDW: 17.5 % — ABNORMAL HIGH (ref 11.5–15.5)
WBC: 15.6 K/uL — ABNORMAL HIGH (ref 4.0–10.5)
nRBC: 0 % (ref 0.0–0.2)

## 2024-08-13 LAB — GLUCOSE, CAPILLARY
Glucose-Capillary: 155 mg/dL — ABNORMAL HIGH (ref 70–99)
Glucose-Capillary: 169 mg/dL — ABNORMAL HIGH (ref 70–99)
Glucose-Capillary: 146 mg/dL — ABNORMAL HIGH (ref 70–99)
Glucose-Capillary: 149 mg/dL — ABNORMAL HIGH (ref 70–99)

## 2024-08-13 MED ORDER — DOXYCYCLINE HYCLATE 100 MG PO TABS: 100.0000 mg | ORAL_TABLET | Freq: Two times a day (BID) | ORAL | Status: DC

## 2024-08-13 MED ORDER — HYDROXYZINE HCL 10 MG PO TABS: 10.0000 mg | ORAL_TABLET | Freq: Every day | ORAL | 0 refills | Status: DC | PRN

## 2024-08-13 MED ORDER — HYDROXYZINE HCL 10 MG PO TABS
10.0000 mg | ORAL_TABLET | Freq: Every day | ORAL | 0 refills | Status: DC | PRN
  Filled 2024-08-13: qty 30, 30d supply, fill #0

## 2024-08-13 MED ORDER — LORAZEPAM 0.5 MG PO TABS: 0.5000 mg | ORAL_TABLET | ORAL | 0 refills | Status: DC | PRN

## 2024-08-13 MED ORDER — LORAZEPAM 0.5 MG PO TABS
0.5000 mg | ORAL_TABLET | ORAL | 0 refills | Status: DC | PRN
  Filled 2024-08-13: qty 5, 1d supply, fill #0

## 2024-08-13 MED ORDER — AMOXICILLIN-POT CLAVULANATE 875-125 MG PO TABS: 1.0000 | ORAL_TABLET | Freq: Two times a day (BID) | ORAL | Status: DC

## 2024-08-13 NOTE — Progress Notes (Signed)
 "                                                                                                                                                                                                         Daily Progress Note   Patient Name: Gilbert Wilson       Date: 08/13/2024 DOB: 1946/01/21  Age: 78 y.o. MRN#: [id number] Attending Physician: [name], MD Primary Care Physician: [name] Admit Date: 08/02/2024  Reason for Consultation/Follow-up: Establishing goals of care  Subjective: Received notification that family have questions for PMT prior to patient's discharge today.   Went to visit patient at bedside-daughter/[name] present.  She called patient's wife/[name] via speaker phone.  Patient was lying in bed asleep -did not attempt to wake to preserve comfort. No signs or non-verbal gestures of pain or discomfort noted. No respiratory distress, increased work of breathing, or secretions noted.   Emotional support provided to family.  Family indicate they are ready for his discharge today and are planning to enroll him in hospice care on Monday through the [place].  Patient will discharge home with family.   Reviewed symptom management plan for patient over the weekend [facility].  Family feel patient needs a prescription for anxiety.  They indicate he already has equipment and pain medications available.   Recommended 0.5 mg of Ativan  every 4 hours as needed for anxiety.  Family inquired about getting oxygen in the home in case he needs it.  Education provided that hospice can order oxygen if needed.  Currently patient is not on oxygen and does not need this prior to discharge.  Family wonder if hospice will provide patient with IV fluids as his oral intake is poor.  Education provided on the natural trajectory at end-of-life.  Recommendation was given against IV fluids as this disrupts the natural course and can ultimately cause  uncomfortable symptoms such as swelling and shortness of breath.  Family expressed understanding.  All questions and concerns addressed.   Length of Stay: 11  Current Medications: Scheduled Meds:   amLODipine   5 mg Oral Daily   apixaban   5 mg Oral BID   Chlorhexidine  Gluconate Cloth  6 each Topical Q0600   collagenase    Topical Daily   cycloSPORINE   130 mg Oral BID   darbepoetin (ARANESP ) injection - DIALYSIS  60 mcg Subcutaneous Q Fri-1800   doxycycline   100 mg Oral BID   ferrous sulfate   325 mg Oral Q breakfast   finasteride   5 mg  Oral QHS   insulin  aspart  0-5 Units Subcutaneous QHS   insulin  aspart  0-9 Units Subcutaneous TID WC   lactobacillus   Oral TID   levETIRAcetam   750 mg Oral BID   mirtazapine   7.5 mg Oral QHS   mycophenolate   180 mg Oral BID   nutrition supplement (JUVEN)  1 packet Oral BID BM   pantoprazole   40 mg Oral Daily   sodium bicarbonate   650 mg Oral BID   tamsulosin   0.4 mg Oral Daily    Continuous Infusions:  cefTRIAXone  (ROCEPHIN )  IV 2 g (08/12/24 1651)    PRN Meds: acetaminophen  **OR** [DISCONTINUED] acetaminophen , albuterol , bisacodyl , guaiFENesin -dextromethorphan , hydrALAZINE , HYDROmorphone , hydrOXYzine , labetalol , polyethylene glycol  Physical Exam Vitals and nursing note reviewed.  Constitutional:      General: He is not in acute distress.    Appearance: He is ill-appearing.  Pulmonary:     Effort: Pulmonary effort is normal. No respiratory distress.  Skin:    General: Skin is warm and dry.  Neurological:     Comments: asleep             Vital Signs: BP (!) 165/78   Pulse 97   Temp 99.3 F (37.4 C) (Oral)   Resp 16   Ht 6' 1 (1.854 m)   Wt 89.8 kg   SpO2 94%   BMI 26.12 kg/m  SpO2: SpO2: 94 % O2 Device: O2 Device: Room Air O2 Flow Rate:    Intake/output summary:  Intake/Output Summary (Last 24 hours) at 08/13/2024 1234 Last data filed at 08/12/2024 1818 Gross per 24 hour  Intake 402.17 ml  Output 1000 ml  Net  -597.83 ml   LBM: Last BM Date : 08/12/24 Baseline Weight: Weight: 89.8 kg Most recent weight: Weight: 89.8 kg       Palliative Assessment/Data: PPS 20%      Patient Active Problem List   Diagnosis Date Noted   Hypernatremia 08/07/2024   Chronic ulcer of left heel (HCC) 08/03/2024   Chronic kidney disease, stage 3b (HCC) 08/03/2024   Goals of care, counseling/discussion 08/03/2024   Sacral ulcer (HCC) 08/02/2024   Sacral decubitus ulcer, stage II (HCC) 01/06/2024   Pressure injury of left heel, unstageable (HCC) 01/06/2024   Arthritis 01/04/2024   Bladder outlet obstruction 01/04/2024   Hypercalcemia 01/04/2024   Aspiration into airway 01/04/2024   Protein-calorie malnutrition, severe 11/30/2023   CAP (community acquired pneumonia) 11/21/2023   Acute on chronic diastolic CHF (congestive heart failure) (HCC) 11/21/2023   Thrombocytopenia 10/04/2023   Rhabdomyolysis 10/04/2023   Community acquired pneumonia 10/04/2023   Influenza A with pneumonia 09/28/2023   Acute encephalopathy 01/01/2022   Complicated UTI (urinary tract infection) 01/01/2022   Acute kidney injury superimposed on chronic kidney disease 01/01/2022   History of CVA with residual deficit 01/01/2022   Dementia (HCC) 11/25/2021   Pyuria 11/25/2021   Type 2 diabetes mellitus (HCC) 11/24/2021   Stroke (HCC) 11/24/2021   Vitamin D  deficiency 11/24/2021   Immunodeficiency, unspecified 11/24/2021   Seizure (HCC) 11/24/2021   Sensorineural hearing loss 11/24/2021   Posttraumatic stress disorder 11/24/2021   Obstructive sleep apnea of adult 11/24/2021   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/24/2021   History of renal transplant 11/24/2021   Liver transplant status (HCC) 11/24/2021   Hemiplegia, unspecified affecting unspecified side (HCC) 11/24/2021   Essential hypertension 11/24/2021   Dysphagia, unspecified 11/24/2021   Chronic hepatitis  C (HCC)  11/24/2021   Cirrhosis of liver (HCC) 11/24/2021   Cerebellar stroke syndrome 11/24/2021   Cardiomegaly 11/24/2021   Beta thalassemia trait 11/24/2021   Benign prostatic hyperplasia with lower urinary tract symptoms 11/24/2021   Atrial fibrillation (HCC) 11/24/2021   Liver transplant recipient [facility]) 11/24/2021   Acute CVA (cerebrovascular accident) (HCC) 11/24/2021   Bradycardia 07/04/2021   Anemia of chronic disease 05/21/2009    Palliative Care Assessment & Plan   Patient Profile: 78 y.o. male  with past medical history of dementia, prior CVA with right side hemiplegia, liver failure s/p transplant, ESRD s/p renal transplant, seizure disorder admitted on 08/02/2024 with lethargy and leukocytosis.   Per H&P on 12/2 by Dahal MD, patient has been physically and cognitively been declining after discussion with family. Patient has had more diarrhea 2/2 antibiotics for recurrent UTIs resulting in contamination of a sacral decubitus ulcer. Sources of infection include urine and sacral ulcer given history of recurrent UTI as well as increased diarrhea which increases likelihood of contamination of wound.    CXR 08/02/2024 demonstrates hypoinflation without acute findings concerning for pneumonia. X-ray 08/02/2024 of left foot with heel pressure injury does not suggest osteomyelitis. MRI of pelvis on 08/02/2024 demonstrated small sinus tract extending to sacrum and coccyx that is suspicious for osteomyelitis as well as intrapelvic abscess. Media review of sacral ulcer and left heel pressure injury on 08/02/2024 shows significant deterioration compared to prior images taken on 01/05/2024. Leukocytosis without fever but with possible sources of infection including sacral ulcer and intrapelvic abscess.   Assessment: Principal Problem:   Sacral ulcer (HCC) Active Problems:   Type 2 diabetes mellitus (HCC)   History of renal transplant   Essential hypertension   Atrial fibrillation (HCC)   Liver  transplant recipient (HCC)   Anemia of chronic disease   Dementia (HCC)   History of CVA with residual deficit   Protein-calorie malnutrition, severe   Bladder outlet obstruction   Chronic ulcer of left heel (HCC)   Chronic kidney disease, stage 3b (HCC)   Goals of care, counseling/discussion   Hypernatremia   Concern about end of life  Recommendations/Plan: Plan is for discharge home with family today Family plan to enroll patient in hospice through the [facility] on Monday Recommend prescription for 0.5 mg Ativan  every 4 hours as needed for anxiety to get patient through the weekend until hospice enrollment PMT will continue to follow peripherally. If there are any imminent needs please call the service directly  Goals of Care and Additional Recommendations: Limitations on Scope of Treatment: Full Comfort Care  Code Status:    Code Status Orders  (From admission, onward)           Start     Ordered   08/02/24 1519  Full code  Continuous       Question:  By:  Answer:  Consent: discussion documented in EHR   08/02/24 1519           Code Status History     Date Active Date Inactive Code Status Order ID Comments User Context   01/04/2024 2235 01/06/2024 2329 Full Code [id number]  Moody Alto, MD ED   11/21/2023 0353 12/03/2023 1858 Full Code [id number]  Laveda Roosevelt, MD ED   11/21/2023 0233 11/21/2023 0353 Full Code [id number]  Laveda Roosevelt, MD ED   09/28/2023 1320 10/04/2023 2051 Full Code [id number]  Von Bellis, MD ED   01/01/2022 1207 01/05/2022 0841 Full Code [id number]  Claudene Maximino LABOR, MD ED  11/24/2021 2244 11/27/2021 1756 Full Code [id number]  Seena Marsa NOVAK, MD ED   07/04/2021 1707 07/08/2021 0202 Full Code [id number]  Lou Claretta HERO, MD ED      Advance Directive Documentation    Flowsheet Row Most Recent Value  Type of Advance Directive Healthcare Power of Attorney  Pre-existing out of facility DNR order (yellow form or pink MOST form) --  MOST Form in Place? --     Prognosis:  Poor  Discharge Planning: Home with Hospice  Care plan was discussed with primary RN, patient's family, attending  Thank you for allowing the Palliative Medicine Team to assist in the care of this patient.   Total Time 35 minutes Prolonged Time Billed  no       Jeoffrey HERO Sharps, NP  Please contact Palliative Medicine Team phone at [phone] for questions and concerns.   *Portions of this note are a verbal dictation therefore any spelling and/or grammatical errors are due to the Dragon Medical One system interpretation.     "

## 2024-08-13 NOTE — Progress Notes (Signed)
 BP rechecked 165/78, MD made aware advised to now give BP medication. Medication given, all needs met at this time. Bed in lowest position and call light within reach.

## 2024-08-13 NOTE — Consult Note (Addendum)
 WOC team consulted for sacral  and R ischial wounds. This wound has been evaluated by surgery 12/4 (see their note) who felt no need for surgical intervention. Surgical team wrote for wet to dry dressings twice daily with application of Santyl  for chemical debridement once daily. Also ordered PT hydrotherapy which patient is receiving ongoing (see last note 12/11)  Patient is reported by bedside nurse to be on a low air loss mattress and appears to be receiving nutritional support with Ju[employer].  On review of latest photos wounds appear to continue with necrotic tissue but slowly improving with PT hydrotherapy and Santyl . Santyl  remains most appropriate dressing.   WOC team will not follow.  Please reconsult if further needs arise.   Thank you,    Powell Bar MSN, RN-BC, [employer]

## 2024-08-13 NOTE — TOC Transition Note (Addendum)
 Transition of Care [facility]) - Discharge Note   Patient Details  Name: Gilbert Wilson MRN: [id number] Date of Birth: 1945-11-21  [facility]) CM/SW Contact:  Robynn Eileen Hoose, RN Phone Number: 08/13/2024, 12:33 PM   Clinical Narrative:   Patient is being discharged today. Channing with [facility] made aware.   1404: PTAR transportation arranged, staff made aware. PTAR forms and facesheet sent to unit.   Final next level of care: Home w Home Health Services Barriers to Discharge: No Barriers Identified   Patient Goals and CMS Choice Patient states their goals for this hospitalization and ongoing recovery are:: wife to return home CMS Medicare.gov Compare Post Acute Care list provided to::  (wife) Choice offered to / list presented to : Spouse      Discharge Placement                       Discharge Plan and Services Additional resources added to the After Visit Summary for     Discharge Planning Services: CM Consult Post Acute Care Choice: Home Health          DME Arranged: N/A DME Agency: NA       HH Arranged: RN HH Agency: [facility] Date [facility] Agency Contacted: 08/05/24 Time HH Agency Contacted: 1511 Representative spoke with at [facility]: Channing  Social Drivers of Health (SDOH) Interventions SDOH Screenings   Food Insecurity: No Food Insecurity (08/03/2024)  Housing: Low Risk (08/03/2024)  Transportation Needs: No Transportation Needs (08/03/2024)  Utilities: Not At Risk (08/03/2024)  Social Connections: Moderately Integrated (08/03/2024)  Tobacco Use: Medium Risk (08/04/2024)     Readmission Risk Interventions    11/27/2021   10:20 AM  Readmission Risk Prevention Plan  Transportation Screening Complete  PCP or Specialist Appt within 3-5 Days Complete  HRI or Home Care Consult Complete  Social Work Consult for Recovery Care Planning/Counseling Complete  Palliative Care Screening Not Applicable  Medication Review Furniture Conservator/restorer) Complete

## 2024-08-13 NOTE — Plan of Care (Signed)

## 2024-08-13 NOTE — TOC Progression Note (Signed)
 Transition of Care [facility]) - Progression Note    Patient Details  Name: Gilbert Wilson MRN: [id number] Date of Birth: [birth year]  [facility] CM/SW Contact  Bridget Cordella Simmonds, LCSW Phone Number: 08/13/2024, 11:58 AM  Clinical Narrative:   CSW informed pt will DC home today.  CSW spoke with pt daughter Tawni.  Pt currently with [facility] RN services for wound care.  They are interested in home hospice services through the [facility] and attempted to reach pt [facility] CS[facility] yesterday but were unable to speak with her.  If [facility] cannot provide hosp[facility], they would consider medicare hospice: pt has [facility] Medicare.  Daughter is also asking to speak with someone from palliative care team here at the hospital prior to DC.  And they do need ambulance transport home.    Expected Discharge Plan: Home w Home Health Services Barriers to Discharge: Continued Medical Work up               Expected Discharge Plan and Services   Discharge Planning Services: CM Consult Post Acute Care Choice: Home Health Living arrangements for the past 2 months: Single Family Home Expected Discharge Date: 08/13/24               DME Arranged: N/A DME Agency: NA       HH Arranged: RN HH Agency: [facility] Date HH Agency Contacted: 08/05/24 Time HH Agency Contacted: 1511 Representative spoke with at [facility] Agency: Channing   Social Drivers of Health (SDOH) Interventions SDOH Screenings   Food Insecurity: No Food Insecurity (08/03/2024)  Housing: Low Risk (08/03/2024)  Transportation Needs: No Transportation Needs (08/03/2024)  Utilities: Not At Risk (08/03/2024)  Social Connections: Moderately Integrated (08/03/2024)  Tobacco Use: Medium Risk (08/04/2024)    Readmission Risk Interventions    11/27/2021   10:20 AM  Readmission Risk Prevention Plan  Transportation Screening Complete  PCP or Specialist Appt within 3-5 Days Complete  HRI or Home Care Consult Complete  Social Work Consult for  Recovery Care Planning/Counseling Complete  Palliative Care Screening Not Applicable  Medication Review Oceanographer) Complete

## 2024-08-13 NOTE — Progress Notes (Signed)
 Patient has been discharged, IV removed, AVS instructions reviewed with daughter Everett Ehrler), verbalized understanding. Patient is awaiting PTAR pick up and has not been picked up yet, IV antibiotic due at 1700, MD made aware of removal of IV's for discharge, advised no need to give IV antibiotic. All needs met at this time, bed in lowest position and call light within reach.

## 2024-08-13 NOTE — Plan of Care (Signed)
°  Problem: Education: Goal: Ability to describe self-care measures that may prevent or decrease complications (Diabetes Survival Skills Education) will improve Outcome: Progressing   Problem: Nutritional: Goal: Maintenance of adequate nutrition will improve Outcome: Progressing   Problem: Skin Integrity: Goal: Risk for impaired skin integrity will decrease Outcome: Progressing   Problem: Health Behavior/Discharge Planning: Goal: Ability to manage health-related needs will improve Outcome: Progressing

## 2024-08-13 NOTE — Progress Notes (Signed)
 Patient's DBP 58, MD made aware. Advised to hold amlodipine  for an hour and reassess.

## 2024-08-13 NOTE — Discharge Summary (Addendum)
 Physician Discharge Summary  REMO KIRSCHENMANN FMW:[id number] DOB: [birth year] DOA: 08/02/2024  PCP: [name]  Admit date: 08/02/2024 Discharge date: 08/13/2024 Recommendations for Outpatient Follow-up:  Follow up with PCP in 1 weeks-call for appointment Please obtain BMP/CBC in one week at home health Palliative care referral Monthly change the Foley  Discharge Dispo: home w/ hh Discharge Condition: Stable Code Status:   Code Status: Full Code Diet recommendation:  Diet Order             DIET - DYS 1 Fluid consistency: Nectar Thick  Diet effective now                    Brief/Interim Summary: Gilbert Wilson is a 78 y.o. male with PMH significant for severe dementia, h/o stroke with right hemiparesis bedbound status for about the past 3 years, total care dependent, h/o renal and liver transplant, seizure disorder, bladder outlet obstruction requiring in and out cath 4 times a day, frequent UTI, DVT and A-fib no longer on AC, osteoarthritis, sacral decubitus and left ankle pressure skin injury. Was hospitalized in April 2025 for 2 weeks for CHF exacerbation and again in May for flareup of left knee osteoarthritis.  He is cared for at home by his wife and daughter. Since about April 2025 he has had persistent and progressing sacral ulcer along with left heel ulcer.  Family endorsed on admission that patient has been noticeably progressively declining both physically and mentally. He has difficulty endorsing pain but has been noted to have pain involving his sacrum from a pressure ulcer. Recent courses of antibiotics for UTI have caused diarrhea which has further created difficulty managing his sacral wound. Due to worsening lethargy, he was brought to the ER for further evaluation. Workup was concerning for sepsis and he was admitted for more workup with sacral ulcer, left heel ulcer, possible perirectal abscess (ruled out on CT). Palliative care also followed.   Essentially treated IV antibiotics and ID recommending 2 weeks oral at discharge.  Tried to discharge but hypernatremia and worsened anemia kept for 1 more night to modify fluids and transfuse 1 unit of blood.  Patient remained hospitalized due to leukocytosis low-grade temperature, continued on wound care and hydrotherapy. At this time remains hemodynamically stable WBC stable slightly downtrending no more fever recurrence I had extensive discussion with patient's daughter and wife about disposition: They are aware high risk of readmission as well as decompensation> he remains clinically stable, overall at baseline, After discussion they feel comfortable going home today with home health. They would like to have palliative care referral.  Subjective: Seen and examined this morning Daughter at the bedside.  Wife on the phone. Overnight temperature <100  BP stable afebrile heart rate respirations stable Labs reviewed WBC downtrending CK downtrending BMP stable  Discharge Diagnoses:   Sacral ulcer: He had persistent sacral ulcer and worsening lethargy on admission. MRI sacrum shows multiple findings: Soft tissue ulceration over distal sacrum and coccyx with probable small sinus tract extending to bone suspicious for early OM; additional decubitus ulcer and deep sinus tract extending to the right ischial tuberosity Not a surgical candidate given poor functional status, nutritional status, bedbound and no chance of healing wound.  Continue to continue wound care, antibiotics as per ID, general surgery was consulted 12/4.  Advised local debridement/hydrotherapy wound care as per PT ,WOCN On ceftriaxone , daptomycin  with CK monitoring-CK is rising-ID recommended to switch daptomycin  to doxycycline  12/12, continued on ceftriaxone  >WBC downtrending slightly,  CK improving-given IVF overnight. She will need 2 weeks of antibiotics total including inpatient as per ID.  Advise CBC check in 1 week with home  health.  Patient will continue wound care at home along with hospital bed with ER mattress offloading nutritional augmentation.  Lethargy Intermittent tachypnea Dementia at baseline minimally interactive: Patient usually is lethargic and sleepy after eating, he does have intermittent tachypnea which he gets at home too.  This morning he is alert awake. Chest x-ray was done and unremarkable 2 days ago, continue anxiolytics, supportive care monitor for any signs of infection Seen by palliative care again and after further discussion recommending Ativan  for anxiety [facility]  Perirectal abscess-resolved as of 08/05/2024 MRI sacrum also shows ill-defined right perirectal fluid collection suspicious for intrapelvic abscess IR consulted to evaluate if abscess able to be drained CT pelvis performed on 08/04/2024 and ruled out presence of abscess No further need for IR attempted drainage given CT findings See antibiotic plan for sacral ulcer   Low grade temp-resolved Leukocytosis: Some cough.  Chest x-ray and UA unremarkable, has leukocytosis but afebrile now and WBC downtrending, on antibiotics as above.  Monitor CBC -WBC continues to improve slowly as below Recent Labs  Lab 08/09/24 0441 08/10/24 0132 08/11/24 0441 08/12/24 0559 08/13/24 0439  WBC 11.3* 13.9* 16.1* 15.7* 15.6*    Hypernatremia suspect from volume depletion poor oral intake sodium nicely improving  from peak of 154sas below. Off IVF, cont to encourage oral intake  Protein-calorie malnutrition, severe Somewhat poor intake at home intermittently. Low albumin  and suspected low prealbumin Also signs of third spacing throughout   Goals of care, counseling/discussion Patient has had progressive decline over the past several months.  Seemingly since April 2025 he has been unable to heal sacral ulcer and has had recurrent infections. For now continuing antibiotics.  Daughter and mother have discussed  further and for now wishing to remain aggressive for this hospitalization.  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 and bedbound at baseline Fully dependent on all ADLs    Anemia of chronic disease Baseline hemoglobin appears to be around 8.5 to 10 g/dL At baseline on admission with mild gradual drop; sacral wound has been mildly bleeding at home prior to admission as well which is likely source along with underlying CKD and some probable hemodilution with IVF-Patient does get intermittent Epogen injections outpatient; family wishing to hold off on blood transfusion for now and we will use ESA agent (Aranesp ) for now (given on 12/5) since Hgb 6.3 g/dL on 87/1, S/P 1 unit PRBC and hemoglobin has nicely improved around 8 g now.     Liver transplant recipient: History of renal transplant Continue cyclosporine  and mycophenolate    Chronic kidney disease, stage 3b Metabolic acidosis: patient has history of CKD3b. Baseline creat ~ 1.6 - 1.8, eGFR~ 39-43.  Resume home oral bicarb Recent Labs    08/05/24 0430 08/06/24 0139 08/07/24 0829 08/08/24 0431 08/09/24 0441 08/09/24 1006 08/10/24 0132 08/11/24 0441 08/12/24 0559 08/13/24 0439  BUN 53* 50* 42* 42* 36* 35* 35* 33* 33* 38*  CREATININE 1.78* 1.61* 1.45* 1.45* 1.43*  1.43* 1.58* 1.27* 1.41* 1.45*  CO2 15* 17* 18* 17* 11* 23 19* 20* 18* 21*  K 4.2 4.2 4.4 4.6 4.2 4.3 4.1 4.3 4.6 4.8   Mild rhabdomyolysis: Discontinued daptomycin .  recheck CK downtrending encourage oral hydration   Bladder outlet obstruction chronic urinary retention; does CIC at  home by family multiples times daily agree with foley while hospitalized and discussed further with wife on 12/6 and we will plan to continue foley at discharge for simplicity especially since already getting recurrent UTIs with CIC and going home with palliative care in place or at least [facility] to help change foley monthly. UA not checked on admission-but with antibiotic should cover Continue indwelling foley at discharge as noted above; HHRN to change monthly   Atrial fibrillation  Eliquis  will resumed  as HB is stable  Essential hypertension As needed medications for now   Type 2 diabetes mellitus Continue SSI and CBG monitoring  Loose stool: appears more soft now.  Monitor   DVT prophylaxis:eliquis   Code Status:   Code Status: Full Code Family Communication: plan of care discussed with patient/DAUGHTER at bedside again. Patient status is: Remains hospitalized because of severity of illness Level of care: Med-Surg   Dispo: The patient is from: home            Anticipated disposition: Home with family today.  Objective: Vitals last 24 hrs: Vitals:   08/13/24 0530 08/13/24 0945 08/13/24 0959 08/13/24 1048  BP: (!) 157/75 (!) 150/58 (!) 160/70 (!) 165/78  Pulse: (!) 105  97   Resp: 18  16   Temp:   99.3 F (37.4 C)   TempSrc:   Oral   SpO2: 95%  94%   Weight:      Height:        Physical Examination: General exam: Alert awake HEENT:Oral mucosa moist, Ear/Nose WNL grossly Respiratory system: CTA bilaterally, tachypneic Cardiovascular system: S1 & S2 +, No JVD. Gastrointestinal system: Abdomen soft,NT,ND, BS+ Nervous System: Alert awake Extremities: extremities warm, leg edema + Skin: Warm, no rashes MSK: thin muscle bulk,tone, power . Foley+  Medications reviewed:  Scheduled Meds:  amLODipine   5 mg Oral Daily   apixaban   5 mg Oral BID   Chlorhexidine  Gluconate Cloth  6 each Topical Q0600   collagenase    Topical Daily   cycloSPORINE   130 mg Oral BID   darbepoetin  (ARANESP ) injection - DIALYSIS  60 mcg Subcutaneous Q Fri-1800   doxycycline   100 mg Oral BID   ferrous sulfate   325 mg Oral Q breakfast   finasteride   5 mg Oral QHS   insulin  aspart  0-5 Units Subcutaneous QHS   insulin  aspart  0-9 Units Subcutaneous TID WC   lactobacillus   Oral TID   levETIRAcetam   750 mg Oral BID   mirtazapine   7.5 mg Oral QHS   mycophenolate   180 mg Oral BID   nutrition supplement (JUVEN)  1 packet Oral BID BM   pantoprazole   40 mg Oral Daily   sodium bicarbonate   650 mg Oral BID   tamsulosin   0.4 mg Oral Daily   Continuous Infusions:  cefTRIAXone  (ROCEPHIN )  IV 2 g (08/12/24 1651)   Diet: Diet Order             DIET - DYS 1 Fluid consistency: Nectar Thick  Diet effective now                  Pressure Ulcer: POA as below Wound 08/04/24 Pressure Injury  Heel Left Unstageable - Full thickness tissue loss in which the base of the injury is covered by slough (yellow, tan, gray, green or brown) and/or eschar (tan, brown or black) in the wound bed. (Active)     Wound Pressure Injury Ischial tuberosity Right Unstageable - Full thickness tissue loss in which the base of the injury is covered by slough (yellow, tan, gray, green or brown) and/or eschar (tan, brown or black) in the wound bed. (Active)     Wound 08/02/24 1830 Pressure Injury Sacrum Medial Unstageable - Full thickness tissue loss in which the base of the injury is covered by slough (yellow, tan, gray, green or brown) and/or eschar (tan, brown or black) in the wound bed. (Active)      Consultation: See note.  Discharge Instructions  Discharge Instructions     Discharge instructions   Complete by: As directed    CBC BMP in 1 week  Please call call MD or return to ER for similar or worsening recurring problem that brought you to hospital or if any fever,nausea/vomiting,abdominal pain, uncontrolled pain, chest pain,  shortness of breath or any other alarming symptoms.  Please follow-up your  doctor as instructed in a week time and call the office for appointment.  Please avoid alcohol , smoking, or any other illicit substance and maintain healthy habits including taking your regular medications as prescribed.  You were cared for by a hospitalist during your hospital stay. If you have any questions about your discharge medications or the care you received while you were in the hospital after you are discharged, you can call the unit and ask to speak with the hospitalist on call if the hospitalist that took care of you is not available.  Once you are discharged, your primary care physician will handle any further medical issues. Please note that NO REFILLS for any discharge medications will be authorized once you are discharged, as it is imperative that you return to your primary care physician (or establish a relationship with a primary care physician if you do not have one) for your aftercare needs so that they can reassess your need for medications and monitor your lab values   Discharge wound care:   Complete by: As directed    NS WD dressing changes to sacral wound and right gluteal crease wound twice daily.  Apply 1/4 thick layer of Santyl  to wound beds daily with one of the dressing changes.  Apply 1/4 thick layer of Santyl  to wound beds, fill in wounds with saline moistened gauze and top with dry gauze/ABD pad.   Increase activity slowly   Complete by: As directed       Allergies as of 08/13/2024       Reactions   Linezolid Other (See Comments)   Seizures   Levaquin [levofloxacin] Itching   Lisinopril Cough        Medication List     STOP taking these medications    traMADol  50 MG tablet Commonly known as: ULTRAM        TAKE these medications    acetaminophen  500 MG tablet Commonly known as: TYLENOL  Take 1-1.5 tablets (500-750 mg total) by mouth 2 (two) times daily as needed for mild pain (pain score 1-3). What changed: when to take this   albuterol  108  (90 Base) MCG/ACT inhaler Commonly known as: VENTOLIN  HFA Inhale 2 puffs into the lungs every 6 (six) hours as needed for wheezing or shortness of breath.   amLODipine  5 MG tablet Commonly  known as: NORVASC  Take 1 tablet (5 mg total) by mouth daily.   amoxicillin -clavulanate 875-125 MG tablet Commonly known as: AUGMENTIN  Take 1 tablet by mouth 2 (two) times daily for 4 days.   apixaban  5 MG Tabs tablet Commonly known as: ELIQUIS  Take 1 tablet (5 mg total) by mouth 2 (two) times daily.   cycloSPORINE  100 MG/ML microemulsion solution Commonly known as: NEORAL  Take 130 mg by mouth 2 (two) times daily. 1.52ml   doxycycline  100 MG tablet Commonly known as: VIBRA -TABS Take 1 tablet (100 mg total) by mouth 2 (two) times daily for 4 days.   nutrition supplement (JUVEN) Pack Take 1 packet by mouth 2 (two) times daily between meals.   Ensure High Protein Liqd Take 237 mLs by mouth in the morning, at noon, and at bedtime.   epoetin alfa 10000 UNIT/ML injection Commonly known as: EPOGEN Inject 10,000 Units into the skin every Tuesday.   ferrous sulfate  325 (65 FE) MG tablet Take 325 mg by mouth daily with breakfast.   finasteride  5 MG tablet Commonly known as: PROSCAR  Take 5 mg by mouth at bedtime.   furosemide  20 MG tablet Commonly known as: LASIX  Take 1 tablet (20 mg total) by mouth daily as needed for edema.   gentamicin  cream 0.1 % Commonly known as: GARAMYCIN  Apply 1 Application topically daily. Apply to wound   guaiFENesin  600 MG 12 hr tablet Commonly known as: MUCINEX  Take 600 mg by mouth 2 (two) times daily as needed for to loosen phlegm.   HYDROmorphone  2 MG tablet Commonly known as: DILAUDID  Take 0.5-1 tablets (1-2 mg total) by mouth every 4 (four) hours as needed for severe pain (pain score 7-10) or moderate pain (pain score 4-6).   hydrOXYzine  10 MG tablet Commonly known as: ATARAX  Take 1 tablet (10 mg total) by mouth daily as needed for anxiety.   Lantus   SoloStar 100 UNIT/ML Solostar Pen Generic drug: insulin  glargine Inject 0-6 Units into the skin at bedtime. If BS>130-Take 6 units, Increase 1 unit for every 10 mg/dl above 869.   levETIRAcetam  750 MG tablet Commonly known as: KEPPRA  Take 1 tablet (750 mg total) by mouth 2 (two) times daily.   LORazepam  0.5 MG tablet Commonly known as: Ativan  Take 1 tablet (0.5 mg total) by mouth every 4 (four) hours as needed for up to 15 doses for anxiety.   mirtazapine  15 MG tablet Commonly known as: REMERON  Take 0.5 tablets (7.5 mg total) by mouth at bedtime. What changed: how much to take   mycophenolate  180 MG EC tablet Commonly known as: MYFORTIC  Take 180 mg by mouth 2 (two) times daily.   nystatin  powder Apply 1 Application topically daily.   omeprazole 20 MG capsule Commonly known as: PRILOSEC Take 20 mg by mouth daily as needed (acid reflex).   polyethylene glycol 17 g packet Commonly known as: MIRALAX  / GLYCOLAX  Take 34 g by mouth daily.   senna-docusate 8.6-50 MG tablet Commonly known as: Senokot-S Take 1-2 tablets by mouth 2 (two) times daily between meals as needed for mild constipation or moderate constipation.   sodium bicarbonate  650 MG tablet Take 650 mg by mouth 2 (two) times daily.   tamsulosin  0.4 MG Caps capsule Commonly known as: FLOMAX  Take 0.4 mg by mouth daily.               Discharge Care Instructions  (From admission, onward)           Start     Ordered  08/13/24 0000  Discharge wound care:       Comments: NS WD dressing changes to sacral wound and right gluteal crease wound twice daily.  Apply 1/4 thick layer of Santyl  to wound beds daily with one of the dressing changes.  Apply 1/4 thick layer of Santyl  to wound beds, fill in wounds with saline moistened gauze and top with dry gauze/ABD pad.   08/13/24 1146            Contact information for follow-up providers     [facility] Follow up.   Contact information: [address]  [place] [place] [address] [phone]         [facility] in 1 week(s).   Contact information: [address] [facility] [place] [address] [phone]              Contact information for after-discharge care     Home Medical Care     [facility]) .   Service: Home Health Services                    Allergies[1]  The results of significant diagnostics from this hospitalization (including imaging, microbiology, ancillary and laboratory) are listed below for reference.    Microbiology: Recent Results (from the past 240 hours)  Culture, blood (Routine X 2) w Reflex to ID Panel     Status: None (Preliminary result)   Collection Time: 08/10/24  7:15 PM   Specimen: BLOOD LEFT HAND  Result Value Ref Range Status   Specimen Description BLOOD LEFT HAND  Final   Special Requests   Final    BOTTLES DRAWN AEROBIC ONLY Blood Culture results may not be optimal due to an inadequate volume of blood received in culture bottles   Culture   Final    NO GROWTH 3 DAYS Performed at [facility], [address] [place], [place] [address]    Report Status PENDING  Incomplete  Culture, blood (Routine X 2) w Reflex to ID Panel     Status: None (Preliminary result)   Collection Time: 08/10/24  7:15 PM   Specimen: BLOOD  Result Value Ref Range Status   Specimen Description BLOOD SITE NOT SPECIFIED  Final   Special Requests   Final    BOTTLES DRAWN AEROBIC ONLY Blood Culture results may not be optimal due to an inadequate volume of blood received in culture bottles   Culture   Final    NO GROWTH 3 DAYS Performed at [facility], [address], [place], [place] [address]    Report Status PENDING  Incomplete    Procedures/Studies: DG Chest Port 1 View Result Date: 08/10/2024 EXAM: 1 VIEW(S) XRAY OF THE CHEST 08/10/2024 10:44:00 AM COMPARISON: 08/02/2024 CLINICAL HISTORY: Cough FINDINGS:  LUNGS AND PLEURA: No focal pulmonary opacity. No pleural effusion. No pneumothorax. HEART AND MEDIASTINUM: No acute abnormality of the cardiac and mediastinal silhouettes. BONES AND SOFT TISSUES: No acute osseous abnormality. IMPRESSION: 1. No acute cardiopulmonary process. Electronically signed by: Lynwood Seip MD 08/10/2024 11:17 AM EST RP Workstation: [id number]   CT PELVIS W CONTRAST Result Date: 08/04/2024 EXAM: CT PELVIS, WITH IV CONTRAST 08/04/2024 04:19:17 PM TECHNIQUE: Axial images were acquired through the pelvis with IV contrast. Reformatted images were reviewed. Automated exposure control, iterative reconstruction, and/or weight based adjustment of the mA/kV was utilized to reduce the radiation dose to as low as reasonably achievable. 75 mL (iohexol  (OMNIPAQUE )  350 MG/ML injection 75 mL IOHEXOL  350 MG/ML SOLN) was administered intravenously. COMPARISON: None available. CLINICAL HISTORY: possible perirectal abscess FINDINGS: BONES: Chronic heterotopic ossification adjacent to the greater trochanter of the left femur. No acute fracture or focal osseous lesion. JOINTS: No dislocation. The joint spaces are normal. SOFT TISSUES: Ulceration superficial to the lower sacrum and coccyx with abnormal soft tissue density material in the subcutaneous space that contacts the dorsal spine of the lower sacrum. No abscess or drainable fluid collection. Anasarca. INTRAPELVIC CONTENTS: Right lower quadrant transplant kidney with mild hydroureteronephrosis and severe periureteral stranding. Foley catheter within the urinary bladder. The urinary bladder is thick-walled but decompressed. The prostate measures 6.7 cm in transverse dimension. Calcific aortic atherosclerosis. IMPRESSION: 1. No perirectal abscess or drainable fluid collection. 2. Ulceration superficial to the lower sacrum and coccyx with abnormal soft tissue density material in the subcutaneous space that contacts the dorsal spine of the lower sacrum. No  abscess or drainable fluid collection. 3. Right lower quadrant transplant kidney with mild hydroureteronephrosis and severe periureteral stranding, likely ascending urinary infection. Electronically signed by: Franky Stanford MD 08/04/2024 08:22 PM EST RP Workstation: [id number]   MR SACRUM SI JOINTS W WO CONTRAST Result Date: 08/02/2024 CLINICAL DATA:  Decubitus ulcer.  Evaluate for sacral osteomyelitis. EXAM: MRI PELVIS WITHOUT AND WITH CONTRAST TECHNIQUE: Multiplanar multisequence MR imaging of the pelvis was performed both before and after administration of intravenous contrast. CONTRAST:  8mL GADAVIST  GADOBUTROL  1 MMOL/ML IV SOLN COMPARISON:  Prostate MRI 03/31/2022.  Pelvic CT 11/20/2023. FINDINGS: Technical note: Despite efforts by the technologist and patient, mild motion artifact is present on today's exam and could not be eliminated. This reduces exam sensitivity and specificity. Bones/Joint/Cartilage There are areas of soft tissue ulceration posterior to the lower sacrum and coccyx and inferior to the right ischial tuberosity, further described below. There is T2 hyperintensity and low level enhancement within the distal sacrum and coccyx, suspicious for early osteomyelitis. No definite cortical destruction identified on T1 weighted images. The upper sacrum and sacroiliac joints appear intact. No evidence of ischial osteomyelitis. Mild degenerative changes of both hips without evidence of joint effusion or osteomyelitis. Muscles and Tendons Generalized pelvic muscular atrophy and edema. There is asymmetric edema and mild enlargement of the left hip adductor muscles. There is heterogeneous low T1 and low T2 signal within the left ischiofemoral space. There is asymmetric left common hamstring tendinosis with partial tearing. Soft tissue As above, superficial soft tissue ulceration over the distal sacrum and coccyx with a probable small sinus tract extending to bone. No organized fluid collection is  identified in this area. Additional decubitus ulcer and deep sinus tract extending to the right ischial tuberosity. Generalized decreased soft tissue enhancement surrounding the distal sacrum and coccyx, suspicious for devitalized soft tissue. There is heterogeneous T2 hyperintensity and enhancement within the presacral soft tissues, asymmetric to the right. Ill-defined right perirectal fluid collection demonstrates low-level peripheral enhancement and measures up to 5.0 x 1.7 cm on image 3 4/9, suspicious for an intrapelvic abscess. Heterogeneous enlargement of the prostate gland again noted, similar to previous MRI. 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 There is generalized subcutaneous edema. IMPRESSION: 1. Soft tissue ulceration over the distal sacrum and coccyx with probable small sinus tract extending to bone. There is T2 hyperintensity and low level enhancement within the distal sacrum and coccyx, suspicious for early osteomyelitis. 2. Additional decubitus ulcer and deep sinus tract extending to the right ischial  tuberosity. No evidence of right ischial osteomyelitis. 3. Ill-defined right perirectal fluid collection with low-level peripheral enhancement, suspicious for an intrapelvic abscess. Given the motion and small field of view limitations of this examination, this may be better evaluated with pelvic CT with contrast. 4. Generalized pelvic muscular atrophy and edema. Asymmetric edema and mild enlargement of the left hip adductor muscles, likely reactive. Indeterminate heterogeneous low signal in the left ischiofemoral space, potentially calcifications or gas. This too could be further evaluated with CT. 5. Heterogeneous enlargement of the prostate gland, similar to previous MRI. Electronically Signed   By: Elsie Perone M.D.   On: 08/02/2024 18:01   DG Foot Complete Left Result Date: 08/02/2024 CLINICAL DATA:  Evaluate for osteomyelitis. EXAM: DG  FOOT COMPLETE 3+V*L* COMPARISON:  None Available. FINDINGS: Diffuse decreased bone mineralization. Moderate degenerative change of the first MTP joint and mild degenerative change at the first IP joint. No acute fracture or dislocation. Degenerative changes over the midfoot and hindfoot. Soft tissue wound over the superficial aspect of the inferior plantar soft tissues adjacent the posterior calcaneus. No significant bone destruction to suggest osteomyelitis. Small vessel atherosclerotic disease is present. IMPRESSION: 1. Soft tissue wound over the inferior plantar soft tissues adjacent the posterior calcaneus. No significant bone destruction to suggest osteomyelitis. 2. Degenerative changes as described. Electronically Signed   By: Toribio Agreste M.D.   On: 08/02/2024 13:22   DG Chest Portable 1 View Result Date: 08/02/2024 CLINICAL DATA:  Dementia and diabetes.  Potential UTI. EXAM: PORTABLE CHEST 1 VIEW COMPARISON:  01/04/2024 FINDINGS: Lungs are hypoinflated without lobar consolidation, effusion or pneumothorax. Minimal stable prominence of the central pulmonary bronchovascular markings likely due to the degree of hypoinflation. Cardiomediastinal silhouette and remainder of the exam is unchanged. IMPRESSION: Hypoinflation without acute cardiopulmonary disease. Electronically Signed   By: Toribio Agreste M.D.   On: 08/02/2024 13:10    Labs: BNP (last 3 results) Recent Labs    11/20/23 2021  BNP 213.9*   Basic Metabolic Panel: Recent Labs  Lab 08/07/24 0829 08/08/24 0431 08/09/24 0441 08/09/24 1006 08/10/24 0132 08/11/24 0441 08/12/24 0559 08/13/24 0439  NA 152* 154* 150* 154* 149* 146* 148* 148*  K 4.4 4.6 4.2 4.3 4.1 4.3 4.6 4.8  CL 128* 125* 124* 123* 122* 117* 120* 121*  CO2 18* 17* 11* 23 19* 20* 18* 21*  GLUCOSE 139* 110* 129* 122* 180* 139* 156* 185*  BUN 42* 42* 36* 35* 35* 33* 33* 38*  CREATININE 1.45* 1.45* 1.43* 1.43* 1.58* 1.27* 1.41* 1.45*  CALCIUM  9.1 9.3 9.2 9.8 9.2 9.2  9.3 9.5  MG 1.8 1.7 1.8  --   --   --   --   --    Liver Function Tests: No results for input(s): AST, ALT, ALKPHOS, BILITOT, PROT, ALBUMIN  in the last 168 hours. No results for input(s): LIPASE, AMYLASE in the last 168 hours. No results for input(s): AMMONIA in the last 168 hours. CBC: Recent Labs  Lab 08/07/24 0829 08/08/24 0431 08/08/24 1859 08/09/24 0441 08/10/24 0132 08/11/24 0441 08/12/24 0559 08/13/24 0439  WBC 10.6* 10.8*  --  11.3* 13.9* 16.1* 15.7* 15.6*  NEUTROABS 7.8* 8.1*  --  8.6*  --   --   --   --   HGB 7.0* 6.3*   < > 7.3* 8.0* 8.8* 9.2* 9.2*  HCT 23.3* 21.0*   < > 23.2* 25.9* 28.7* 30.2* 29.6*  MCV 79.0* 80.2  --  79.5* 80.7 80.6 81.6 80.7  PLT  510* 446*  --  405* 424* 466* 396 416*   < > = values in this interval not displayed.   CBG: Recent Labs  Lab 08/12/24 1149 08/12/24 1640 08/12/24 2134 08/13/24 0759 08/13/24 1148  GLUCAP 162* 132* 243* 155* 169*   Hgb A1c No results for input(s): HGBA1C in the last 72 hours. Anemia work up No results for input(s): VITAMINB12, FOLATE, FERRITIN, TIBC, IRON, RETICCTPCT in the last 72 hours. Cardiac Enzymes: Recent Labs  Lab 08/12/24 0559 08/13/24 0439  CKTOTAL 1,776* 1,264*   BNP: Invalid input(s): POCBNP D-Dimer No results for input(s): DDIMER in the last 72 hours. Lipid Profile No results for input(s): CHOL, HDL, LDLCALC, TRIG, CHOLHDL, LDLDIRECT in the last 72 hours. Thyroid function studies No results for input(s): TSH, T4TOTAL, T3FREE, THYROIDAB in the last 72 hours.  Invalid input(s): FREET3 Urinalysis    Component Value Date/Time   COLORURINE YELLOW 08/10/2024 1323   APPEARANCEUR CLEAR 08/10/2024 1323   LABSPEC 1.015 08/10/2024 1323   PHURINE 7.0 08/10/2024 1323   GLUCOSEU NEGATIVE 08/10/2024 1323   HGBUR LARGE (A) 08/10/2024 1323   BILIRUBINUR NEGATIVE 08/10/2024 1323   KETONESUR NEGATIVE 08/10/2024 1323   PROTEINUR 100 (A)  08/10/2024 1323   NITRITE NEGATIVE 08/10/2024 1323   LEUKOCYTESUR NEGATIVE 08/10/2024 1323   Sepsis Labs Recent Labs  Lab 08/10/24 0132 08/11/24 0441 08/12/24 0559 08/13/24 0439  WBC 13.9* 16.1* 15.7* 15.6*   Microbiology Recent Results (from the past 240 hours)  Culture, blood (Routine X 2) w Reflex to ID Panel     Status: None (Preliminary result)   Collection Time: 08/10/24  7:15 PM   Specimen: BLOOD LEFT HAND  Result Value Ref Range Status   Specimen Description BLOOD LEFT HAND  Final   Special Requests   Final    BOTTLES DRAWN AEROBIC ONLY Blood Culture results may not be optimal due to an inadequate volume of blood received in culture bottles   Culture   Final    NO GROWTH 3 DAYS Performed at [facility], [address], [place], [place] [address]    Report Status PENDING  Incomplete  Culture, blood (Routine X 2) w Reflex to ID Panel     Status: None (Preliminary result)   Collection Time: 08/10/24  7:15 PM   Specimen: BLOOD  Result Value Ref Range Status   Specimen Description BLOOD SITE NOT SPECIFIED  Final   Special Requests   Final    BOTTLES DRAWN AEROBIC ONLY Blood Culture results may not be optimal due to an inadequate volume of blood received in culture bottles   Culture   Final    NO GROWTH 3 DAYS Performed at [facility], [address] [place], [place] [address]    Report Status PENDING  Incomplete  Time coordinating discharge: 35 minutes SIGNED: Mennie LAMY, MD  [facility] 08/13/2024, 1:22 PM If 7PM-7AM, please contact night-coverage [email or web address]       [1]  Allergies Allergen Reactions   Linezolid Other (See Comments)    Seizures   Levaquin [Levofloxacin] Itching   Lisinopril Cough

## 2024-08-15 LAB — CULTURE, BLOOD (ROUTINE X 2)
Culture: NO GROWTH
Culture: NO GROWTH

## 2024-08-16 ENCOUNTER — Emergency Department (HOSPITAL_COMMUNITY)

## 2024-08-16 ENCOUNTER — Inpatient Hospital Stay (HOSPITAL_COMMUNITY)
Admission: EM | Admit: 2024-08-16 | Discharge: 2024-08-18 | DRG: 640 | Disposition: A | Discharge status: Hospice | Attending: Family Medicine | Admitting: Family Medicine

## 2024-08-16 ENCOUNTER — Other Ambulatory Visit: Payer: Self-pay

## 2024-08-16 ENCOUNTER — Encounter (HOSPITAL_COMMUNITY): Payer: Self-pay | Admitting: Internal Medicine

## 2024-08-16 DIAGNOSIS — R64 Cachexia: Secondary | ICD-10-CM | POA: Diagnosis present

## 2024-08-16 DIAGNOSIS — E11621 Type 2 diabetes mellitus with foot ulcer: Secondary | ICD-10-CM | POA: Diagnosis present

## 2024-08-16 DIAGNOSIS — I10 Essential (primary) hypertension: Secondary | ICD-10-CM | POA: Diagnosis present

## 2024-08-16 DIAGNOSIS — Z515 Encounter for palliative care: Secondary | ICD-10-CM | POA: Diagnosis not present

## 2024-08-16 DIAGNOSIS — R899 Unspecified abnormal finding in specimens from other organs, systems and tissues: Principal | ICD-10-CM

## 2024-08-16 DIAGNOSIS — N320 Bladder-neck obstruction: Secondary | ICD-10-CM | POA: Diagnosis present

## 2024-08-16 DIAGNOSIS — R627 Adult failure to thrive: Secondary | ICD-10-CM | POA: Diagnosis present

## 2024-08-16 DIAGNOSIS — Z1152 Encounter for screening for COVID-19: Secondary | ICD-10-CM | POA: Diagnosis not present

## 2024-08-16 DIAGNOSIS — E860 Dehydration: Secondary | ICD-10-CM | POA: Diagnosis present

## 2024-08-16 DIAGNOSIS — Z66 Do not resuscitate: Secondary | ICD-10-CM | POA: Diagnosis present

## 2024-08-16 DIAGNOSIS — E119 Type 2 diabetes mellitus without complications: Secondary | ICD-10-CM

## 2024-08-16 DIAGNOSIS — I129 Hypertensive chronic kidney disease with stage 1 through stage 4 chronic kidney disease, or unspecified chronic kidney disease: Secondary | ICD-10-CM | POA: Diagnosis present

## 2024-08-16 DIAGNOSIS — Z87891 Personal history of nicotine dependence: Secondary | ICD-10-CM

## 2024-08-16 DIAGNOSIS — F0390 Unspecified dementia without behavioral disturbance: Secondary | ICD-10-CM | POA: Diagnosis present

## 2024-08-16 DIAGNOSIS — E870 Hyperosmolality and hypernatremia: Secondary | ICD-10-CM | POA: Diagnosis present

## 2024-08-16 DIAGNOSIS — G8190 Hemiplegia, unspecified affecting unspecified side: Secondary | ICD-10-CM

## 2024-08-16 DIAGNOSIS — G40909 Epilepsy, unspecified, not intractable, without status epilepticus: Secondary | ICD-10-CM | POA: Diagnosis present

## 2024-08-16 DIAGNOSIS — I639 Cerebral infarction, unspecified: Secondary | ICD-10-CM | POA: Diagnosis not present

## 2024-08-16 DIAGNOSIS — Z7960 Long term (current) use of unspecified immunomodulators and immunosuppressants: Secondary | ICD-10-CM

## 2024-08-16 DIAGNOSIS — Z794 Long term (current) use of insulin: Secondary | ICD-10-CM

## 2024-08-16 DIAGNOSIS — L89154 Pressure ulcer of sacral region, stage 4: Secondary | ICD-10-CM | POA: Diagnosis present

## 2024-08-16 DIAGNOSIS — Z944 Liver transplant status: Secondary | ICD-10-CM | POA: Diagnosis not present

## 2024-08-16 DIAGNOSIS — Z8249 Family history of ischemic heart disease and other diseases of the circulatory system: Secondary | ICD-10-CM

## 2024-08-16 DIAGNOSIS — Z888 Allergy status to other drugs, medicaments and biological substances status: Secondary | ICD-10-CM

## 2024-08-16 DIAGNOSIS — Z79899 Other long term (current) drug therapy: Secondary | ICD-10-CM

## 2024-08-16 DIAGNOSIS — L89152 Pressure ulcer of sacral region, stage 2: Secondary | ICD-10-CM | POA: Diagnosis present

## 2024-08-16 DIAGNOSIS — N1832 Chronic kidney disease, stage 3b: Secondary | ICD-10-CM | POA: Diagnosis present

## 2024-08-16 DIAGNOSIS — I6930 Unspecified sequelae of cerebral infarction: Secondary | ICD-10-CM | POA: Diagnosis not present

## 2024-08-16 DIAGNOSIS — E1122 Type 2 diabetes mellitus with diabetic chronic kidney disease: Secondary | ICD-10-CM | POA: Diagnosis present

## 2024-08-16 DIAGNOSIS — I4891 Unspecified atrial fibrillation: Secondary | ICD-10-CM | POA: Diagnosis present

## 2024-08-16 DIAGNOSIS — L97429 Non-pressure chronic ulcer of left heel and midfoot with unspecified severity: Secondary | ICD-10-CM | POA: Diagnosis present

## 2024-08-16 DIAGNOSIS — L89520 Pressure ulcer of left ankle, unstageable: Secondary | ICD-10-CM | POA: Diagnosis present

## 2024-08-16 DIAGNOSIS — N179 Acute kidney failure, unspecified: Secondary | ICD-10-CM | POA: Diagnosis present

## 2024-08-16 DIAGNOSIS — D849 Immunodeficiency, unspecified: Secondary | ICD-10-CM | POA: Diagnosis present

## 2024-08-16 DIAGNOSIS — I480 Paroxysmal atrial fibrillation: Secondary | ICD-10-CM | POA: Diagnosis not present

## 2024-08-16 DIAGNOSIS — D649 Anemia, unspecified: Secondary | ICD-10-CM | POA: Diagnosis present

## 2024-08-16 DIAGNOSIS — Z7401 Bed confinement status: Secondary | ICD-10-CM | POA: Diagnosis not present

## 2024-08-16 DIAGNOSIS — Z833 Family history of diabetes mellitus: Secondary | ICD-10-CM

## 2024-08-16 DIAGNOSIS — Z940 Kidney transplant status: Secondary | ICD-10-CM | POA: Diagnosis not present

## 2024-08-16 DIAGNOSIS — Z86718 Personal history of other venous thrombosis and embolism: Secondary | ICD-10-CM

## 2024-08-16 DIAGNOSIS — R031 Nonspecific low blood-pressure reading: Secondary | ICD-10-CM | POA: Diagnosis present

## 2024-08-16 DIAGNOSIS — I69351 Hemiplegia and hemiparesis following cerebral infarction affecting right dominant side: Secondary | ICD-10-CM

## 2024-08-16 DIAGNOSIS — Z7901 Long term (current) use of anticoagulants: Secondary | ICD-10-CM

## 2024-08-16 DIAGNOSIS — K7460 Unspecified cirrhosis of liver: Secondary | ICD-10-CM | POA: Diagnosis present

## 2024-08-16 DIAGNOSIS — Z87440 Personal history of urinary (tract) infections: Secondary | ICD-10-CM

## 2024-08-16 LAB — CBC WITH DIFFERENTIAL/PLATELET
Abs Immature Granulocytes: 0.19 K/uL — ABNORMAL HIGH (ref 0.00–0.07)
Basophils Absolute: 0.1 K/uL (ref 0.0–0.1)
Basophils Relative: 1 %
Eosinophils Absolute: 0.1 K/uL (ref 0.0–0.5)
Eosinophils Relative: 1 %
HCT: 30.7 % — ABNORMAL LOW (ref 39.0–52.0)
Hemoglobin: 9.4 g/dL — ABNORMAL LOW (ref 13.0–17.0)
Immature Granulocytes: 1 %
Lymphocytes Relative: 6 %
Lymphs Abs: 1.2 K/uL (ref 0.7–4.0)
MCH: 25.8 pg — ABNORMAL LOW (ref 26.0–34.0)
MCHC: 30.6 g/dL (ref 30.0–36.0)
MCV: 84.1 fL (ref 80.0–100.0)
Monocytes Absolute: 1 K/uL (ref 0.1–1.0)
Monocytes Relative: 6 %
Neutro Abs: 16 K/uL — ABNORMAL HIGH (ref 1.7–7.7)
Neutrophils Relative %: 85 %
Platelets: 376 K/uL (ref 150–400)
RBC: 3.65 MIL/uL — ABNORMAL LOW (ref 4.22–5.81)
RDW: 17.6 % — ABNORMAL HIGH (ref 11.5–15.5)
WBC: 18.6 K/uL — ABNORMAL HIGH (ref 4.0–10.5)
nRBC: 0 % (ref 0.0–0.2)

## 2024-08-16 LAB — URINALYSIS, MICROSCOPIC (REFLEX)

## 2024-08-16 LAB — COMPREHENSIVE METABOLIC PANEL WITH GFR
ALT: 28 U/L (ref 0–44)
AST: 21 U/L (ref 15–41)
Albumin: 3.1 g/dL — ABNORMAL LOW (ref 3.5–5.0)
Alkaline Phosphatase: 119 U/L (ref 38–126)
Anion gap: 11 (ref 5–15)
BUN: 48 mg/dL — ABNORMAL HIGH (ref 8–23)
CO2: 19 mmol/L — ABNORMAL LOW (ref 22–32)
Calcium: 10.1 mg/dL (ref 8.9–10.3)
Chloride: 126 mmol/L — ABNORMAL HIGH (ref 98–111)
Creatinine, Ser: 1.73 mg/dL — ABNORMAL HIGH (ref 0.61–1.24)
GFR, Estimated: 40 mL/min — ABNORMAL LOW (ref >60)
Glucose, Bld: 163 mg/dL — ABNORMAL HIGH (ref 70–99)
Potassium: 4.8 mmol/L (ref 3.5–5.1)
Sodium: 156 mmol/L — ABNORMAL HIGH (ref 135–145)
Total Bilirubin: 0.5 mg/dL (ref 0.0–1.2)
Total Protein: 6.8 g/dL (ref 6.5–8.1)

## 2024-08-16 LAB — RESP PANEL BY RT-PCR (RSV, FLU A&B, COVID)  RVPGX2
Influenza A by PCR: NEGATIVE
Influenza B by PCR: NEGATIVE
Resp Syncytial Virus by PCR: NEGATIVE
SARS Coronavirus 2 by RT PCR: NEGATIVE

## 2024-08-16 LAB — URINALYSIS, ROUTINE W REFLEX MICROSCOPIC
Bilirubin Urine: NEGATIVE
Glucose, UA: NEGATIVE mg/dL
Ketones, ur: NEGATIVE mg/dL
Nitrite: NEGATIVE
Protein, ur: >300 mg/dL — AB
Specific Gravity, Urine: 1.02 (ref 1.005–1.030)
pH: 6 (ref 5.0–8.0)

## 2024-08-16 LAB — MAGNESIUM: Magnesium: 2.2 mg/dL (ref 1.7–2.4)

## 2024-08-16 LAB — CBG MONITORING, ED: Glucose-Capillary: 122 mg/dL — ABNORMAL HIGH (ref 70–99)

## 2024-08-16 MED ORDER — CYCLOSPORINE MODIFIED (GENGRAF) 100 MG/ML PO SOLN
130.0000 mg | Freq: Two times a day (BID) | ORAL | Status: DC
  Filled 2024-08-16 (×5): qty 1.3

## 2024-08-16 MED ORDER — MYCOPHENOLATE SODIUM 180 MG PO TBEC
180.0000 mg | DELAYED_RELEASE_TABLET | Freq: Two times a day (BID) | ORAL | Status: DC
  Filled 2024-08-16 (×4): qty 1

## 2024-08-16 MED ORDER — LEVETIRACETAM (KEPPRA) 500 MG/5 ML ADULT IV PUSH
750.0000 mg | Freq: Two times a day (BID) | INTRAVENOUS | Status: DC
  Administered 2024-08-16 – 2024-08-18 (×4): 750 mg via INTRAVENOUS
  Filled 2024-08-16 (×4): qty 10

## 2024-08-16 MED ORDER — SODIUM CHLORIDE 0.9 % IV BOLUS
1000.0000 mL | Freq: Once | INTRAVENOUS | Status: AC
  Administered 2024-08-16: 22:00:00 1000 mL via INTRAVENOUS

## 2024-08-16 MED ORDER — LORAZEPAM 0.5 MG PO TABS
0.5000 mg | ORAL_TABLET | ORAL | Status: DC | PRN
  Filled 2024-08-16: qty 1

## 2024-08-16 MED ORDER — APIXABAN 5 MG PO TABS
5.0000 mg | ORAL_TABLET | Freq: Two times a day (BID) | ORAL | Status: DC
  Filled 2024-08-16: qty 1

## 2024-08-16 MED ORDER — FINASTERIDE 5 MG PO TABS: 5.0000 mg | ORAL_TABLET | Freq: Every day | ORAL | Status: DC

## 2024-08-16 MED ORDER — ACETAMINOPHEN 325 MG PO TABS: 650.0000 mg | ORAL_TABLET | Freq: Four times a day (QID) | ORAL | Status: DC | PRN

## 2024-08-16 MED ORDER — AMLODIPINE BESYLATE 5 MG PO TABS: 5.0000 mg | ORAL_TABLET | Freq: Every day | ORAL | Status: DC

## 2024-08-16 MED ORDER — INSULIN ASPART 100 UNIT/ML IJ SOLN: 0.0000 [IU] | INTRAMUSCULAR | Status: DC

## 2024-08-16 MED ORDER — SODIUM CHLORIDE 0.45 % IV SOLN: INTRAVENOUS | Status: DC

## 2024-08-16 MED ORDER — ACETAMINOPHEN 650 MG RE SUPP: 650.0000 mg | Freq: Four times a day (QID) | RECTAL | Status: DC | PRN

## 2024-08-16 MED ORDER — MIRTAZAPINE 15 MG PO TABS: 15.0000 mg | ORAL_TABLET | Freq: Every day | ORAL | Status: DC

## 2024-08-16 MED ORDER — TAMSULOSIN HCL 0.4 MG PO CAPS
0.4000 mg | ORAL_CAPSULE | Freq: Every day | ORAL | Status: DC
  Filled 2024-08-16: qty 1

## 2024-08-16 NOTE — H&P (Signed)
 History and Physical    Gilbert Wilson FMW:[id number] DOB: 1946/01/19 DOA: 08/16/2024  Patient coming from: Home.  Chief Complaint: Abnormal labs.  HPI: Gilbert Wilson is a 78 y.o. male with history of CVA dementia bedbound for last 3 years with prior history of stroke with right-sided hemiparesis, hypertension, renal and liver transplant, seizure disorder, bladder outlet obstruction, prior history of DVT, sacral decubitus and left ankle pressure ulcer was recently admitted and discharged on 08/13/2024 for infected decubitus ulcer was brought to the ER after labs were drawn by hospice found to be abnormal.  Patient as per the wife who was at the bedside has not been eating well hardly anything for the last few days.  Has not had any nausea vomiting or diarrhea.  ED Course: In ER patient's blood pressure was in the low normal.  Labs show sodium of 156 creatinine increased from baseline around 1.5-1.7.  WBC 18.6 hemoglobin 9.4.  Being low normal blood pressure was given saline bolus.  Admitted for hypernatremia the setting of poor oral intake.  Review of Systems: As per HPI, rest all negative.   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)     Past Surgical History:  Procedure Laterality Date   KIDNEY TRANSPLANT  2016   same time as liver   LIVER TRANSPLANT N/A 2016   SVT ABLATION  2017     reports that he quit smoking about 40 years ago. His smoking use included cigarettes. He has never used smokeless tobacco. He reports that he does not drink alcohol  and does not use drugs.  Allergies[1]  Family History  Problem Relation Age of Onset   Hypertension Mother    Diabetes  Mother    Diabetes Father     Prior to Admission medications  Medication Sig Start Date End Date Taking? Authorizing Provider  acetaminophen  (TYLENOL ) 500 MG tablet Take 1-1.5 tablets (500-750 mg total) by mouth 2 (two) times daily as needed for mild pain (pain score 1-3). 08/08/24  Yes Patsy Lenis, MD  albuterol  (VENTOLIN  HFA) 108 (90 Base) MCG/ACT inhaler Inhale 2 puffs into the lungs every 6 (six) hours as needed for wheezing or shortness of breath.   Yes [provider]  amLODipine  (NORVASC ) 5 MG tablet Take 1 tablet (5 mg total) by mouth daily. 12/03/23  Yes Dahal, Chapman, MD  amoxicillin -clavulanate (AUGMENTIN ) 875-125 MG tablet Take 1 tablet by mouth 2 (two) times daily for 4 days. 08/13/24 08/17/24 Yes [name], MD  apixaban  (ELIQUIS ) 5 MG TABS tablet Take 1 tablet (5 mg total) by mouth 2 (two) times daily. 01/06/24  Yes Gonfa, Taye T, MD  cycloSPORINE  (NEORAL ) 100 MG/ML microemulsion solution Take 130 mg by mouth 2 (two) times daily. 1.29ml 12/28/23  Yes [provider]  doxycycline  (VIBRA -TABS) 100 MG tablet Take 1 tablet (100 mg total) by mouth 2 (two) times daily for 4 days. 08/13/24 08/17/24 Yes [name], MD  epoetin alfa (EPOGEN) 10000 UNIT/ML injection Inject 10,000 Units into the skin every Tuesday.   Yes [provider]  ferrous sulfate  325 (65 FE) MG tablet Take 325 mg by  mouth daily with breakfast.   Yes [provider]  finasteride  (PROSCAR ) 5 MG tablet Take 5 mg by mouth at bedtime.   Yes [provider]  furosemide  (LASIX ) 20 MG tablet Take 1 tablet (20 mg total) by mouth daily as needed for edema. 12/03/23  Yes Dahal, Chapman, MD  gentamicin  cream (GARAMYCIN ) 0.1 % Apply 1 Application topically daily. Apply to wound   Yes [provider]  HYDROmorphone  (DILAUDID ) 2 MG tablet Take 0.5-1 tablets (1-2 mg total) by mouth every 4 (four) hours as needed for severe pain (pain score 7-10) or moderate pain (pain score 4-6). 08/08/24   Yes Patsy Lenis, MD  hydrOXYzine  (ATARAX ) 10 MG tablet Take 1 tablet (10 mg total) by mouth daily as needed for anxiety. 08/13/24  Yes [name], MD  insulin  glargine (LANTUS  SOLOSTAR) 100 UNIT/ML Solostar Pen Inject 6-16 Units into the skin at bedtime. If BS>130-Take 6 units, Increase 1 unit for every 10 mg/dl above 869.   Yes [provider]  levETIRAcetam  (KEPPRA ) 750 MG tablet Take 1 tablet (750 mg total) by mouth 2 (two) times daily. 04/29/23  Yes Garrick Charleston, MD  LORazepam  (ATIVAN ) 0.5 MG tablet Take 1 tablet (0.5 mg total) by mouth every 4 (four) hours as needed for up to 8 doses for anxiety. 08/13/24  Yes [name], MD  mirtazapine  (REMERON ) 15 MG tablet Take 0.5 tablets (7.5 mg total) by mouth at bedtime. Patient taking differently: Take 15 mg by mouth at bedtime. 01/04/22  Yes Tobie Yetta HERO, MD  mycophenolate  (MYFORTIC ) 180 MG EC tablet Take 180 mg by mouth 2 (two) times daily.   Yes [provider]  nutrition supplement, JUVEN, (JUVEN) PACK Take 1 packet by mouth 2 (two) times daily between meals.   Yes [provider]  Nutritional Supplements (ENSURE HIGH PROTEIN) LIQD Take 237 mLs by mouth in the morning, at noon, and at bedtime. 10/12/23  Yes [provider]  omeprazole (PRILOSEC) 20 MG capsule Take 20 mg by mouth daily as needed (acid reflex).   Yes [provider]  sodium bicarbonate  650 MG tablet Take 650 mg by mouth 2 (two) times daily.   Yes [provider]  tamsulosin  (FLOMAX ) 0.4 MG CAPS capsule Take 0.4 mg by mouth daily.   Yes [provider]  senna-docusate (SENOKOT-S) 8.6-50 MG tablet Take 1-2 tablets by mouth 2 (two) times daily between meals as needed for mild constipation or moderate constipation. Patient not taking: Reported on 08/16/2024 01/06/24   Kathrin Mignon DASEN, MD    Physical Exam: Constitutional: Moderately built and nourished. Vitals:   08/16/24 1846 08/16/24 2002 08/16/24 2053 08/16/24 2200   BP: (!) 97/58 93/60 103/67 105/69  Pulse: 76 89 92 89  Resp: (!) 27 (!) 27 (!) 27 (!) 21  Temp: 99 F (37.2 C) 99 F (37.2 C) 97.9 F (36.6 C) 97.9 F (36.6 C)  TempSrc: Axillary Axillary Axillary Axillary  SpO2: 99% 99% 100% 100%   Eyes: Anicteric no pallor. ENMT: No discharge from the ears eyes nose or mouth. Neck: No mass felt.  No neck rigidity. Respiratory: No rhonchi or crepitations. Cardiovascular: S1-S2 heard. Abdomen: Soft nontender bowel sound present. Musculoskeletal: No edema. Skin: Ulcers on the left heel and sacrum. Neurologic: Patient is minimally responsive.  Does not follow commands. Psychiatric: Minimally responsive.   Labs on Admission: I have personally reviewed following labs and imaging studies  CBC: Recent Labs  Lab 08/10/24 0132 08/11/24 0441 08/12/24 0559 08/13/24 9560  08/16/24 1642  WBC 13.9* 16.1* 15.7* 15.6* 18.6*  NEUTROABS  --   --   --   --  16.0*  HGB 8.0* 8.8* 9.2* 9.2* 9.4*  HCT 25.9* 28.7* 30.2* 29.6* 30.7*  MCV 80.7 80.6 81.6 80.7 84.1  PLT 424* 466* 396 416* 376   Basic Metabolic Panel: Recent Labs  Lab 08/10/24 0132 08/11/24 0441 08/12/24 0559 08/13/24 0439 08/16/24 1642  NA 149* 146* 148* 148* 156*  K 4.1 4.3 4.6 4.8 4.8  CL 122* 117* 120* 121* 126*  CO2 19* 20* 18* 21* 19*  GLUCOSE 180* 139* 156* 185* 163*  BUN 35* 33* 33* 38* 48*  CREATININE 1.58* 1.27* 1.41* 1.45* 1.73*  CALCIUM  9.2 9.2 9.3 9.5 10.1  MG  --   --   --   --  2.2   GFR: Estimated Creatinine Clearance: 40.4 mL/min (A) (by C-G formula based on SCr of 1.73 mg/dL (H)). Liver Function Tests: Recent Labs  Lab 08/16/24 1642  AST 21  ALT 28  ALKPHOS 119  BILITOT 0.5  PROT 6.8  ALBUMIN  3.1*   No results for input(s): LIPASE, AMYLASE in the last 168 hours. No results for input(s): AMMONIA in the last 168 hours. Coagulation Profile: No results for input(s): INR, PROTIME in the last 168 hours. Cardiac Enzymes: Recent Labs  Lab  08/12/24 0559 08/13/24 0439  CKTOTAL 1,776* 1,264*   BNP (last 3 results) No results for input(s): PROBNP in the last 8760 hours. HbA1C: No results for input(s): HGBA1C in the last 72 hours. CBG: Recent Labs  Lab 08/12/24 2134 08/13/24 0759 08/13/24 1148 08/13/24 1635 08/13/24 2123  GLUCAP 243* 155* 169* 146* 149*   Lipid Profile: No results for input(s): CHOL, HDL, LDLCALC, TRIG, CHOLHDL, LDLDIRECT in the last 72 hours. Thyroid Function Tests: No results for input(s): TSH, T4TOTAL, FREET4, T3FREE, THYROIDAB in the last 72 hours. Anemia Panel: No results for input(s): VITAMINB12, FOLATE, FERRITIN, TIBC, IRON, RETICCTPCT in the last 72 hours. Urine analysis:    Component Value Date/Time   COLORURINE YELLOW 08/16/2024 1732   APPEARANCEUR CLEAR 08/16/2024 1732   LABSPEC 1.020 08/16/2024 1732   PHURINE 6.0 08/16/2024 1732   GLUCOSEU NEGATIVE 08/16/2024 1732   HGBUR MODERATE (A) 08/16/2024 1732   BILIRUBINUR NEGATIVE 08/16/2024 1732   KETONESUR NEGATIVE 08/16/2024 1732   PROTEINUR >300 (A) 08/16/2024 1732   NITRITE NEGATIVE 08/16/2024 1732   LEUKOCYTESUR SMALL (A) 08/16/2024 1732   Sepsis Labs: @LABRCNTIP (procalcitonin:4,lacticidven:4) ) Recent Results (from the past 240 hours)  Culture, blood (Routine X 2) w Reflex to ID Panel     Status: None   Collection Time: 08/10/24  7:15 PM   Specimen: BLOOD LEFT HAND  Result Value Ref Range Status   Specimen Description BLOOD LEFT HAND  Final   Special Requests   Final    BOTTLES DRAWN AEROBIC ONLY Blood Culture results may not be optimal due to an inadequate volume of blood received in culture bottles   Culture   Final    NO GROWTH 5 DAYS Performed at [facility], [address], [place], [place] [address]    Report Status 08/15/2024 FINAL  Final  Culture, blood (Routine X 2) w Reflex to ID Panel     Status: None   Collection Time: 08/10/24  7:15 PM   Specimen: BLOOD  Result  Value Ref Range Status   Specimen Description BLOOD SITE NOT SPECIFIED  Final   Special Requests   Final  BOTTLES DRAWN AEROBIC ONLY Blood Culture results may not be optimal due to an inadequate volume of blood received in culture bottles   Culture   Final    NO GROWTH 5 DAYS Performed at [facility], [address], [place], [place] [address]    Report Status 08/15/2024 FINAL  Final  Resp panel by RT-PCR (RSV, Flu A&B, Covid) Anterior Nasal Swab     Status: None   Collection Time: 08/16/24  8:51 PM   Specimen: Anterior Nasal Swab  Result Value Ref Range Status   SARS Coronavirus 2 by RT PCR NEGATIVE NEGATIVE Final   Influenza A by PCR NEGATIVE NEGATIVE Final   Influenza B by PCR NEGATIVE NEGATIVE Final    Comment: (NOTE) The Xpert Xpress SARS-CoV-2/FLU/RSV plus assay is intended as an aid in the diagnosis of influenza from Nasopharyngeal swab specimens and should not be used as a sole basis for treatment. Nasal washings and aspirates are unacceptable for Xpert Xpress SARS-CoV-2/FLU/RSV testing.  Fact Sheet for Patients: bloggercourse.com  Fact Sheet for Healthcare Providers: seriousbroker.it  This test is not yet approved or cleared by the [place]  FDA and has been authorized for detection and/or diagnosis of SARS-CoV-2 by FDA under an Emergency Use Authorization (EUA). This EUA will remain in effect (meaning this test can be used) for the duration of the COVID-19 declaration under Section 564(b)(1) of the Act, 21 U.S.C. section 360bbb-3(b)(1), unless the authorization is terminated or revoked.     Resp Syncytial Virus by PCR NEGATIVE NEGATIVE Final    Comment: (NOTE) Fact Sheet for Patients: bloggercourse.com  Fact Sheet for Healthcare Providers: seriousbroker.it  This test is not yet approved or cleared by the [place]  FDA and has been authorized for  detection and/or diagnosis of SARS-CoV-2 by FDA under an Emergency Use Authorization (EUA). This EUA will remain in effect (meaning this test can be used) for the duration of the COVID-19 declaration under Section 564(b)(1) of the Act, 21 U.S.C. section 360bbb-3(b)(1), unless the authorization is terminated or revoked.  Performed at [facility], [address] [place], [place] [address]      Radiological Exams on Admission: DG Chest 1 View Result Date: 08/16/2024 CLINICAL DATA:  Altered mental status. EXAM: CHEST  1 VIEW COMPARISON:  Chest radiograph dated 08/10/2024. FINDINGS: Shallow inspiration. Right lung base atelectasis or infiltrate. No pleural effusion or pneumothorax. Mild cardiomegaly. No acute osseous pathology. IMPRESSION: Right lung base atelectasis or infiltrate. Electronically Signed   By: Vanetta Chou M.D.   On: 08/16/2024 20:21      Assessment/Plan Principal Problem:   Hypernatremia Active Problems:   History of CVA with residual deficit   Chronic ulcer of left heel (HCC)   History of renal transplant   Chronic kidney disease, stage 3b (HCC)   Atrial fibrillation (HCC)   Type 2 diabetes mellitus (HCC)   Immunodeficiency, unspecified   Liver transplant status (HCC)   Hemiplegia, unspecified affecting unspecified side (HCC)   Essential hypertension   Cirrhosis of liver (HCC)   Dementia (HCC)   Sacral decubitus ulcer, stage II (HCC)    Hypernatremia with acute on chronic kidney disease likely from poor oral intake.  As per the wife who is at the bedside patient has hardly had any food last few days.  Patient started on gentle hydration.  Had extensive discussion with patient's wife at the bedside and per patient's wife plan is to focus more on comfort and not to do any extensive  workup at this time.  Slowly transition to full comfort.  Will consult hospice. History of hypertension presently in the low normal blood pressure hold amlodipine . History of  seizures takes Keppra  which we will dose through IV for now until patient can reliably take orally. History of stroke and A-fib takes Eliquis . Chronic anemia follow CBC. Recently admitted for infected sacral decubitus ulcer to be on antibiotics for 2 more days which we will dose with IV. History of liver and renal transplant on immunosuppressants. Advanced dementia.  With minimal interaction. Diabetes mellitus type 2 on sliding scale coverage.  I had extensive discussion with patient's wife who at this time wants to focus on patient's comfort and not to do any extensive workup.  Okay with IV fluids antibiotics and medication as tolerated.  Comfort feeds.  Will consult palliative team.  May slowly transition to full comfort measures.  DVT prophylaxis: Eliquis . Code Status: DNR confirmed with patient's wife. Family Communication: Patient's wife at the bedside. Disposition Plan: Medical floor. Consults called: Palliative team. Admission status: Inpatient.         [1]  Allergies Allergen Reactions   Linezolid Other (See Comments)    Seizures   Levaquin [Levofloxacin] Itching   Lisinopril Cough

## 2024-08-16 NOTE — ED Notes (Signed)
ED PA at BS 

## 2024-08-16 NOTE — ED Triage Notes (Signed)
 PT BIB ems from home. Pt had blood drawn at [facility] and family was called due to lots of labs being abnormal. EMS does not have the labs was told it was all kindey labs and electrolytes. PT is a DNR but paper work was no where to be found at the house. Pt Family Trying to establish hospice care today. Pt is bed bound and does not speak. Chronic foley.   EMS vitals  138/76 109 93% ra 30 RR

## 2024-08-16 NOTE — ED Provider Notes (Signed)
 [place] EMERGENCY DEPARTMENT AT [facility] Provider Note   CSN: [id number] Arrival date & time: 08/16/24  1530     Patient presents with: Abnormal Labs   Gilbert Wilson is a 78 y.o. male.   78 y.o male with a PMH of severe dementia, h/o stroke with right hemiparesis bedbound status for last 5 years, total care dependent, h/o renal and liver transplant, seizure disorder, bladder outlet obstruction requiring in and out cath 4 times a day, frequent UTI, DVT and A-fib no longer on AC, osteoarthritis, sacral decubitus and left ankle pressure skin injury presents to the ED via EMS with a chief complaint of abnormal labs. According to patients wife, he has continued to decline since he got home from the hospital approximately 3 days ago.  He reports that he has not any oral intake in about 3 days.  When the hospice nurses arrived today and drew blood they noticed that his sodium was significantly elevated along with his creatinine level.  She did sign a DNR today, no fevers, no other complaints reported per wife.  The history is provided by the spouse.       Prior to Admission medications  Medication Sig Start Date End Date Taking? Authorizing Provider  acetaminophen  (TYLENOL ) 500 MG tablet Take 1-1.5 tablets (500-750 mg total) by mouth 2 (two) times daily as needed for mild pain (pain score 1-3). 08/08/24   Patsy Lenis, MD  albuterol  (VENTOLIN  HFA) [phone] Base) MCG/ACT inhaler Inhale 2 puffs into the lungs every 6 (six) hours as needed for wheezing or shortness of breath.    [provider]  amLODipine  (NORVASC ) 5 MG tablet Take 1 tablet (5 mg total) by mouth daily. 12/03/23   Arlice Reichert, MD  amoxicillin -clavulanate (AUGMENTIN ) 875-125 MG tablet Take 1 tablet by mouth 2 (two) times daily for 4 days. 08/13/24 08/17/24  [name], MD  apixaban  (ELIQUIS ) 5 MG TABS tablet Take 1 tablet (5 mg total) by mouth 2 (two) times daily. 01/06/24   Gonfa, Taye T, MD   cycloSPORINE  (NEORAL ) 100 MG/ML microemulsion solution Take 130 mg by mouth 2 (two) times daily. 1.41ml 12/28/23   [provider]  doxycycline  (VIBRA -TABS) 100 MG tablet Take 1 tablet (100 mg total) by mouth 2 (two) times daily for 4 days. 08/13/24 08/17/24  [name], MD  epoetin alfa (EPOGEN) 10000 UNIT/ML injection Inject 10,000 Units into the skin every Tuesday.    [provider]  ferrous sulfate  325 (65 FE) MG tablet Take 325 mg by mouth daily with breakfast.    [provider]  finasteride  (PROSCAR ) 5 MG tablet Take 5 mg by mouth at bedtime.    [provider]  furosemide  (LASIX ) 20 MG tablet Take 1 tablet (20 mg total) by mouth daily as needed for edema. 12/03/23   Arlice Reichert, MD  gentamicin  cream (GARAMYCIN ) 0.1 % Apply 1 Application topically daily. Apply to wound    [provider]  guaiFENesin  (MUCINEX ) 600 MG 12 hr tablet Take 600 mg by mouth 2 (two) times daily as needed for to loosen phlegm.    [provider]  HYDROmorphone  (DILAUDID ) 2 MG tablet Take 0.5-1 tablets (1-2 mg total) by mouth every 4 (four) hours as needed for severe pain (pain score 7-10) or moderate pain (pain score 4-6). 08/08/24   Patsy Lenis, MD  hydrOXYzine  (ATARAX ) 10 MG tablet Take 1 tablet (10 mg total) by mouth daily as needed for anxiety. 08/13/24   [name],  MD  insulin  glargine (LANTUS  SOLOSTAR) 100 UNIT/ML Solostar Pen Inject 0-6 Units into the skin at bedtime. If BS>130-Take 6 units, Increase 1 unit for every 10 mg/dl above 869.    [provider]  levETIRAcetam  (KEPPRA ) 750 MG tablet Take 1 tablet (750 mg total) by mouth 2 (two) times daily. 04/29/23   Garrick Charleston, MD  LORazepam  (ATIVAN ) 0.5 MG tablet Take 1 tablet (0.5 mg total) by mouth every 4 (four) hours as needed for up to 8 doses for anxiety. 08/13/24   [name], MD  mirtazapine  (REMERON ) 15 MG tablet Take 0.5 tablets (7.5 mg total) by mouth at bedtime. Patient taking  differently: Take 15 mg by mouth at bedtime. 01/04/22   Tobie Yetta HERO, MD  mycophenolate  (MYFORTIC ) 180 MG EC tablet Take 180 mg by mouth 2 (two) times daily.    [provider]  nutrition supplement, JUVEN, (JUVEN) PACK Take 1 packet by mouth 2 (two) times daily between meals.    [provider]  Nutritional Supplements (ENSURE HIGH PROTEIN) LIQD Take 237 mLs by mouth in the morning, at noon, and at bedtime. 10/12/23   [provider]  nystatin  powder Apply 1 Application topically daily.    [provider]  omeprazole (PRILOSEC) 20 MG capsule Take 20 mg by mouth daily as needed (acid reflex).    [provider]  polyethylene glycol (MIRALAX  / GLYCOLAX ) 17 g packet Take 34 g by mouth daily.    [provider]  senna-docusate (SENOKOT-S) 8.6-50 MG tablet Take 1-2 tablets by mouth 2 (two) times daily between meals as needed for mild constipation or moderate constipation. 01/06/24   Gonfa, Taye T, MD  sodium bicarbonate  650 MG tablet Take 650 mg by mouth 2 (two) times daily.    [provider]  tamsulosin  (FLOMAX ) 0.4 MG CAPS capsule Take 0.4 mg by mouth daily.    [provider]    Allergies: Linezolid, Levaquin [levofloxacin], and Lisinopril    Review of Systems  Unable to perform ROS: Dementia    Updated Vital Signs BP 93/60 (BP Location: Left Arm)   Pulse 89   Temp 99 F (37.2 C) (Axillary)   Resp (!) 27   SpO2 99%   Physical Exam Vitals and nursing note reviewed.  Constitutional:      Comments: Frail appearance.  HENT:     Head: Normocephalic and atraumatic.     Nose: Nose normal.     Mouth/Throat:     Mouth: Mucous membranes are dry.  Cardiovascular:     Rate and Rhythm: Normal rate.  Pulmonary:     Effort: Tachypnea and accessory muscle usage present.     Breath sounds: No wheezing or rales.  Abdominal:     General: Abdomen is flat.     Tenderness: There is no abdominal tenderness.  Neurological:      Comments: Not at baseline per wife.      (all labs ordered are listed, but only abnormal results are displayed) Labs Reviewed  CBC WITH DIFFERENTIAL/PLATELET - Abnormal; Notable for the following components:      Result Value   WBC 18.6 (*)    RBC 3.65 (*)    Hemoglobin 9.4 (*)    HCT 30.7 (*)    MCH 25.8 (*)    RDW 17.6 (*)    Neutro Abs 16.0 (*)    Abs Immature Granulocytes 0.19 (*)    All other components within normal limits  COMPREHENSIVE METABOLIC PANEL WITH GFR -  Abnormal; Notable for the following components:   Sodium 156 (*)    Chloride 126 (*)    CO2 19 (*)    Glucose, Bld 163 (*)    BUN 48 (*)    Creatinine, Ser 1.73 (*)    Albumin  3.1 (*)    GFR, Estimated 40 (*)    All other components within normal limits  URINALYSIS, ROUTINE W REFLEX MICROSCOPIC - Abnormal; Notable for the following components:   Hgb urine dipstick MODERATE (*)    Protein, ur >300 (*)    Leukocytes,Ua SMALL (*)    All other components within normal limits  URINALYSIS, MICROSCOPIC (REFLEX) - Abnormal; Notable for the following components:   Bacteria, UA RARE (*)    Non Squamous Epithelial PRESENT (*)    All other components within normal limits  RESP PANEL BY RT-PCR (RSV, FLU A&B, COVID)  RVPGX2  MAGNESIUM     EKG: None  Radiology: No results found.   Procedures   Medications Ordered in the ED  sodium chloride  0.9 % bolus 1,000 mL (has no administration in time range)    Clinical Course as of 08/16/24 2016  Tue Aug 16, 2024  1929 Confirmed DNR/DNI with the patient's wife and family.  They would like for him to come into the hospital and have hospice set up prior to leaving [RP]  1935 Sodium(!): 156 [JS]  1957 WBC, UA: 11-20 [JS]  1957 Bacteria, UA(!): RARE [JS]  [address]): SMALL [JS]    Clinical Course User Index [JS] Fadel Clason, PA-C [RP] Yolande Lamar BROCKS, MD                                 Medical Decision Making Amount and/or Complexity of Data  Reviewed Labs:  Decision-making details documented in ED Course. Radiology: ordered.    This patient presents to the ED for concern of abnormal labs, this involves a number of treatment options, and is a complaint that carries with it a high risk of complications and morbidity.  The differential diagnosis includes failure to thrive versus acute infection.   Co morbidities: Discussed in HPI   Brief History:  See HPI.   EMR reviewed including pt PMHx, past surgical history and past visits to ER.   See HPI for more details   Lab Tests:  I ordered and independently interpreted labs.  The pertinent results include:    CBC with hypernatremia, elevated BUN and creatine at his baseline. LFTs are within normal limits. Magnesium  level is normal. CBC with a leukocytosis of 18.6 trending upward since his admission 3 days ago. UA with rare bacteria 11-20 wbc, he does have a in an out 3x a day, low concern for UTI at this time.   Imaging Studies:  Chest xray is pending.  Cardiac Monitoring:  The patient was maintained on a cardiac monitor.  I personally viewed and interpreted the cardiac monitored which showed an underlying rhythm nq:pmmzhlojmob irregular  EKG non-ischemic   Medicines ordered:  I ordered medication including bolus  for hydration Reevaluation of the patient after these medicines showed that the patient stayed the same I have reviewed the patients home medicines and have made adjustments as needed  Consults:  I requested consultation with palliative care,  and discussed lab and imaging findings as well as pertinent plan .  Reevaluation:  After the interventions noted above I re-evaluated patient and found that they have :stayed the  same  Social Determinants of Health:  The patient's social determinants of health were a factor in the care of this patient   Problem List / ED Course:  Patient with ongoing medical hx described as above accompanied by wife who  provides must of the history. Patient discharged from the hospital 3 days ago, has abnormal labs including sodium in the 150's not at his baseline. Decline in mental status over the past 3 days with no oral intake. Wife contacted hospice RN at the [facility] who evaluated him today and sent him here for abnormal labs. She has a DNR on file at this time. Labs here with hypernatremia, elevated creatine and a new wbc of 18k. No fevers at home, no vomiting, no other complaints aside from lack of oral intake. Call placed to palliative care for further recommendations.  Will treat his low BP and wbc with fluids, UA is not infectious at this time. I discussed this case with daughter at the bedside who reports wounds on his back and has pictures of the dressings that were changed today.  He did not appear to be significantly worsen.  I discussed this case with Dr. Franky hospitalist service appreciate assistance patient will need admission for his abnormal labs.  Dispostion:  After consideration of the diagnostic results and the patients response to treatment, I feel that the patent would benefit from amidssion for further management.     Portions of this note were generated with Scientist, clinical (histocompatibility and immunogenetics). Dictation errors may occur despite best attempts at proofreading.   Final diagnoses:  Abnormal laboratory test  Failure to thrive in adult    ED Discharge Orders     None          Maureen Broad, PA-C 08/16/24 2016    Yolande Lamar BROCKS, MD 08/18/24 1104

## 2024-08-16 NOTE — ED Provider Triage Note (Signed)
 Emergency Medicine Provider Triage Evaluation Note  Gilbert Wilson , a 78 y.o. male  was evaluated in triage.  Pt complains of abnormal outpatient lab.  Patient is bedbound, nonverbal at baseline, chronic indwelling Foley.  Reportedly had blood drawn at [facility] that was notable for abnormal kidney labs and electrolytes.  Of note patient's family is in the process of establishing hospice, unclear what goals of care are.  Review of Systems  Limited secondary to patient is nonverbal status  Physical Exam  BP 108/61 (BP Location: Left Arm)   Pulse (!) 115   Temp 99.6 F (37.6 C)   Resp (!) 27   SpO2 94%  Gen:   Awake, no distress   Resp:  Normal effort  Card:  Tachycardia   Medical Decision Making  Medically screening exam initiated at 4:15 PM.  Appropriate orders placed.  ZAYVION STAILEY was informed that the remainder of the evaluation will be completed by another provider, this initial triage assessment does not replace that evaluation, and the importance of remaining in the ED until their evaluation is complete.  78 year old male presents to the emergency department with concern for abnormal outpatient labs.  No family at bedside.  Unable to view outpatient blood work from [facility].  Will repeat basic blood work here.  Of note patient is reportedly in the process of seeking hospice care, unclear goals of care.  Patient evaluated sitting up in chair, fully clothed, limited PE. Orders placed. Patient was counseled that they need to remain in the ED until the completion of their work-up including a full H&P, additional testing and results of any tests.  The patient appears stable and the remainder of the encounter may be completed by another provider.   [name], DO 08/16/24 [id number]

## 2024-08-17 DIAGNOSIS — I639 Cerebral infarction, unspecified: Secondary | ICD-10-CM

## 2024-08-17 DIAGNOSIS — F0390 Unspecified dementia without behavioral disturbance: Secondary | ICD-10-CM

## 2024-08-17 DIAGNOSIS — Z66 Do not resuscitate: Secondary | ICD-10-CM

## 2024-08-17 DIAGNOSIS — I6930 Unspecified sequelae of cerebral infarction: Secondary | ICD-10-CM

## 2024-08-17 DIAGNOSIS — Z515 Encounter for palliative care: Secondary | ICD-10-CM

## 2024-08-17 DIAGNOSIS — Z7401 Bed confinement status: Secondary | ICD-10-CM

## 2024-08-17 LAB — BASIC METABOLIC PANEL WITH GFR
Anion gap: 11 (ref 5–15)
BUN: 49 mg/dL — ABNORMAL HIGH (ref 8–23)
CO2: 19 mmol/L — ABNORMAL LOW (ref 22–32)
Calcium: 9.8 mg/dL (ref 8.9–10.3)
Chloride: 129 mmol/L — ABNORMAL HIGH (ref 98–111)
Creatinine, Ser: 1.77 mg/dL — ABNORMAL HIGH (ref 0.61–1.24)
GFR, Estimated: 39 mL/min — ABNORMAL LOW (ref >60)
Glucose, Bld: 123 mg/dL — ABNORMAL HIGH (ref 70–99)
Potassium: 4.8 mmol/L (ref 3.5–5.1)
Sodium: 158 mmol/L — ABNORMAL HIGH (ref 135–145)

## 2024-08-17 LAB — CBG MONITORING, ED
Glucose-Capillary: 125 mg/dL — ABNORMAL HIGH (ref 70–99)
Glucose-Capillary: 116 mg/dL — ABNORMAL HIGH (ref 70–99)

## 2024-08-17 LAB — SODIUM: Sodium: 158 mmol/L — ABNORMAL HIGH (ref 135–145)

## 2024-08-17 MED ORDER — GLYCOPYRROLATE 1 MG PO TABS
1.0000 mg | ORAL_TABLET | ORAL | Status: DC
  Filled 2024-08-17 (×8): qty 1

## 2024-08-17 MED ORDER — BISACODYL 10 MG RE SUPP: 10.0000 mg | Freq: Every day | RECTAL | Status: DC | PRN

## 2024-08-17 MED ORDER — ONDANSETRON 4 MG PO TBDP: 4.0000 mg | ORAL_TABLET | Freq: Four times a day (QID) | ORAL | Status: DC | PRN

## 2024-08-17 MED ORDER — HYDROMORPHONE HCL 1 MG/ML IJ SOLN: 0.5000 mg | INTRAMUSCULAR | Status: DC | PRN

## 2024-08-17 MED ORDER — POLYVINYL ALCOHOL 1.4 % OP SOLN: 1.0000 [drp] | Freq: Four times a day (QID) | OPHTHALMIC | Status: DC | PRN

## 2024-08-17 MED ORDER — LORAZEPAM 2 MG/ML IJ SOLN: 0.5000 mg | INTRAMUSCULAR | Status: DC

## 2024-08-17 MED ORDER — SODIUM CHLORIDE 0.9 % IV SOLN
3.0000 g | Freq: Four times a day (QID) | INTRAVENOUS | Status: DC
  Administered 2024-08-17: 09:00:00 3 g via INTRAVENOUS
  Filled 2024-08-17: qty 8

## 2024-08-17 MED ORDER — DEXTROSE 5 % IV SOLN: INTRAVENOUS | Status: DC

## 2024-08-17 MED ORDER — ONDANSETRON HCL 4 MG/2ML IJ SOLN: 4.0000 mg | Freq: Four times a day (QID) | INTRAMUSCULAR | Status: DC | PRN

## 2024-08-17 MED ORDER — GLYCOPYRROLATE 0.2 MG/ML IJ SOLN
0.4000 mg | INTRAMUSCULAR | Status: DC
  Filled 2024-08-17: qty 2

## 2024-08-17 MED ORDER — LOPERAMIDE HCL 2 MG PO CAPS: 2.0000 mg | ORAL_CAPSULE | ORAL | Status: DC | PRN

## 2024-08-17 MED ORDER — DIPHENHYDRAMINE HCL 50 MG/ML IJ SOLN: 12.5000 mg | INTRAMUSCULAR | Status: DC | PRN

## 2024-08-17 MED ORDER — SODIUM CHLORIDE 0.9 % IV SOLN
100.0000 mg | Freq: Two times a day (BID) | INTRAVENOUS | Status: DC
  Administered 2024-08-17: 10:00:00 100 mg via INTRAVENOUS
  Filled 2024-08-17: qty 100

## 2024-08-17 MED ORDER — BIOTENE DRY MOUTH MT LIQD: 15.0000 mL | OROMUCOSAL | Status: DC | PRN

## 2024-08-17 MED ORDER — GLYCOPYRROLATE 0.2 MG/ML IJ SOLN
0.4000 mg | INTRAMUSCULAR | Status: DC
  Administered 2024-08-17 – 2024-08-18 (×6): 0.4 mg via INTRAVENOUS
  Filled 2024-08-17 (×6): qty 2

## 2024-08-17 MED ORDER — LORAZEPAM 2 MG/ML IJ SOLN: 2.0000 mg | INTRAMUSCULAR | Status: DC | PRN

## 2024-08-17 MED ORDER — SENNA 8.6 MG PO TABS: 1.0000 | ORAL_TABLET | Freq: Every evening | ORAL | Status: DC | PRN

## 2024-08-17 NOTE — Consult Note (Signed)
 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 male  with past medical history of dementia, prior CVA, right side hemiplegia, liver and renal failure s/p transplant, seizure disorder, sacral decubitus ulcer admitted on 08/16/2024 with hypernatremia 2/2 poor PO intake. 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 Family during prior hospitalization requested full code, full scope knowing that interventions were limited and aware that patient is high risk for re-hospitalization. Patient admitted 4 days after discharge for poor PO intake resulting in metabolic derangements. Per EDP note by Maureen PA on 08/16/2024, patient initiated hospice services after poor PO intake but their team advised transfer to hospital for further management for abnormal labs. Per H&P by Franky MD 08/16/2024, family transition to DNR-limited with desire to transition to full comfort measures.   Palliative medicine team consulted for goals of care conversation.   SUMMARY OF RECOMMENDATIONS   DNR-comfort Plan for Home with Hospice with [facility] Pending DME delivery prior to discharge  Symptom Management:  Dilaudid  for  pain/dyspnea/increased work of breathing/RR>25 Tylenol  PRN pain/fever Biotin twice daily Benadryl  PRN itching Robinul  PRN secretions Haldol PRN agitation/delirium Ativan  PRN anxiety/seizure/sleep/distress Zofran  PRN nausea/vomiting Liquifilm Tears PRN dry eye  Code Status: DNR - Comfort  Prognosis:  < 6 weeks  Discharge Planning:  Home with Hospice   Discussed with: Nettey MD about family's decision to transition to full comfort measures with plans for home with hospice.   Subjective:   Reviewed medical records, received report from team, assessed the patient and then meet at the patient's bedside to discuss diagnosis, prognosis, GOC, EOL wishes disposition and options.  I met with patient and wife at the bedside, joined by daughter on phone.   We meet to discuss diagnosis prognosis, GOC, EOL wishes, disposition and options. Concept of Palliative Care was introduced as specialized medical care for people and their families living with serious illness.  If focuses on providing relief from the symptoms and stress of a serious illness.  The goal is to improve quality of life for both the patient and the family. Values and goals of care important to patient and family were attempted to be elicited.  Created space and opportunity for patient  and family to explore thoughts and feelings regarding current medical situation   Natural trajectory and current clinical status were discussed. Questions and concerns addressed. Patient encouraged to call with questions or concerns.    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 - Patient was a charity fundraiser in [employer] but served  with [employer] during the [employer], 100% service connected with the [facility] - Married to [place] for 52 years, 3 children who are all local  Baseline Status: - Patient has been bedbound over the last 3 years since his last major CVA - Requires total care at home, lives with daughter [name]) and wife with assistance augmented by private care giver who provites 4-5 hours of care for 5 days a week  Today's Discussion: - Met with patient and wife bedside while joined by phone - Family opted to transition to hospice while at home and began services with [facility], but they were encouraged to return to hospital for further management - Family agreeable to full comfort measures after prior discussions during previous hospitalization  - Discussed that the medical team will focus on providing full comfort measures and discontinue aggressive interventions such as further diagnostic imaging, lab draws, IV antibiotics, and IVF administration - Family provided [facility] social worker contact Delsie [phone]) for arrangement with [facility] to set up home with hospice - Met with family again after confusion about discontinuing IVF and antibiotics, family was under the impression that while he was in the hospital we would continue current measures prior to discharge, clarified with family and they were agreeable to discontinuing these interventions as it would only prolong the inevitable  Review of Systems  Unable to perform ROS   Objective:   Primary Diagnoses: Present on Admission:  Hypernatremia  Immunodeficiency, unspecified  Essential hypertension  Cirrhosis of liver (HCC)  Atrial fibrillation (HCC)  Dementia (HCC)  Sacral decubitus ulcer, stage II (HCC)  Chronic ulcer of left heel (HCC)  Chronic kidney disease, stage 3b (HCC)  ARF (acute renal failure)   Vital Signs:  BP (!) 111/52 (BP Location: Right Arm)   Pulse 93   Temp 97.9 F (36.6 C) (Oral)   Resp 16   SpO2 96%   Physical  Exam Constitutional:      Appearance: He is ill-appearing.     Comments: Alert but unresponsive to questions compared to prior hospitalization  HENT:     Head: Normocephalic.     Nose: Nose normal.     Mouth/Throat:     Mouth: Mucous membranes are dry.  Eyes:     Extraocular Movements: Extraocular movements intact.  Cardiovascular:     Rate and Rhythm: Normal rate.  Pulmonary:     Effort: Respiratory distress present.  Abdominal:     Palpations: Abdomen is soft.  Skin:    General: Skin is warm.  Neurological:     Mental Status: He is alert. Mental status is at baseline. He is disoriented.    Palliative Assessment/Data: 10%    Thank you for allowing us  to participate in the care of JERONE CUDMORE PMT will continue to support holistically.  I personally spent a total of 75 minutes in the care of the patient today including preparing to see the patient, getting/reviewing separately obtained history, performing a medically appropriate exam/evaluation, counseling and educating, placing orders, referring and communicating with other health care professionals, documenting clinical information in the EHR, and coordinating care.   Signed by: Fairy FORBES Shan DEVONNA Palliative Medicine Team  Team Phone # [phone] (Nights/Weekends)  08/17/2024, 3:02 PM

## 2024-08-17 NOTE — ED Notes (Signed)
 Report given to yellow hallway RN

## 2024-08-17 NOTE — ED Notes (Signed)
 Paged provider regarding patient respiratory rate and absence of swallowing reflex.

## 2024-08-17 NOTE — Progress Notes (Signed)
 [id number]  [facility] Liaison Note  Received a request from Care Manager for hospice services at home after discharge. Spoke with daughter, Tawni and rubye to initiate education related to hospice philosophy, services and team approach to care. [name] verbalized understanding of information given.  Per discussion, the plan is for discharge home via EMS/PTAR once oxygen concentrator is delivered tomorrow.  DME needs discussed. Patient has a hospital bed, hoyer lift and wheel chair.  Family has requested oxygen concentrator.  Please send signed and completed DNR home with the patient/family.  Please provide prescriptions at discharge as needed to ensure ongoing symptom management.  [facility] information and contact numbers given to [place].  Above information shared with Nena, Care Manager.  Please call with any questions or concerns.  Thank you for the opportunity to participate in this patient's care.  Inocente Jacobs, BSN, RN [employer] [phone]

## 2024-08-17 NOTE — Progress Notes (Signed)
°   08/17/24 1232  TOC Brief Assessment  Insurance and Status Reviewed  Patient has primary care physician Yes  Home environment has been reviewed From home  Prior level of function: Total care  Prior/Current Home Services Current home services [facility])  Social Drivers of Health Review SDOH reviewed no interventions necessary  Readmission risk has been reviewed Yes  Transition of care needs transition of care needs identified, TOC will continue to follow   Inpt palliative has been consulted and pt transitioned to comfort care.   Pt active c/[facility] for [facility] care. [facility] sent referral to [facility] home hospice yesterday. Confirmed c/[facility] via secure chat. [facility] to set up home oxygen.   [facility] notification sent via portal.   Current DME: hospital bed, air mattress, Hoyer lift, wc.   Will need ambulance transport at dc.

## 2024-08-17 NOTE — TOC Initial Note (Addendum)
 Transition of Care (TOC) - Initial/Assessment Note   Spoke to daughter Alexandra Posadas and wife Jazmin Vensel  at bedside. Patient from home with [facility] home health. Has wheelchair, hospital bed and hoyer lift. Will need ambulance transport home. Patient currently on oxygen and does not have oxygen at home.  Daughter Valen Gillison and wife Kaylee Wombles want patient to go home with hospice    They spoke with [facility] this morning and was given authorization number [place] [id number] for home with hospice with [facility] ( family chose [facility]) .   Channing with [facility] home health aware.   NCM secure chatted Melissa with [facility] and Team . [facility] will order any needed DME that patient does not already have   Notified [facility] of admission.   PCP Ludivina Like and [facility] social worker is Harlene Burnet [phone] ext [phone]   Harlene Burnet [phone] ext [phone] NCM called and left voicemail   April with [facility] provided following information [facility] Coordinator Deana Gaudreau [phone] in case plan changes to [facility] hospice home   Harlan with [facility] arranging home oxygen  Patient Details  Name: Gilbert Wilson MRN: [id number] Date of Birth: 28-Nov-1945  Transition of Care [facility]) CM/SW Contact:    Stephane Powell Jansky, RN Phone Number: 08/17/2024, 12:45 PM  Clinical Narrative:                   Expected Discharge Plan: Home w Hospice Care Barriers to Discharge: Continued Medical Work up   Patient Goals and CMS Choice Patient states their goals for this hospitalization and ongoing recovery are:: patient nonverbal , daughter Coalton Arch and wife Nasif Bos want patient to go home with hospice CMS Medicare.gov Compare Post Acute Care list provided to:: Patient Represenative (must comment) (daughter Nyan Dufresne and wife Noboru Bidinger want patient to go home with hospice) Choice offered to / list presented to : Spouse, Adult Children (daughter  Malahki Gasaway and wife Malick Netz want patient to go home with hospice)      Expected Discharge Plan and Services   Discharge Planning Services: CM Consult Post Acute Care Choice: Hospice Living arrangements for the past 2 months: Single Family Home                 DME Arranged:  (hospice will order any needed DME) DME Agency: NA         HH Agency: [facility] Date [facility] Agency Contacted: 08/17/24 Time HH Agency Contacted: [phone] Representative spoke with at [facility] Agency: Melissa  Prior Living Arrangements/Services Living arrangements for the past 2 months: Single Family Home Lives with:: Spouse Patient language and need for interpreter reviewed:: Yes        Need for Family Participation in Patient Care: Yes (Comment) Care giver support system in place?: Yes (comment) Current home services: DME Criminal Activity/Legal Involvement Pertinent to Current Situation/Hospitalization: No - Comment as needed  Activities of Daily Living      Permission Sought/Granted   Permission granted to share information with :  (non verbal daughter Journee Kohen and wife Ansley Stanwood want patient to go home with hospice)              Emotional Assessment Appearance:: Appears stated age            Admission diagnosis:  Hypernatremia [E87.0] Abnormal laboratory test [R89.9] Failure to thrive in adult [R62.7] Patient Active Problem List   Diagnosis  Date Noted   Hypernatremia 08/07/2024   Chronic ulcer of left heel (HCC) 08/03/2024   Chronic kidney disease, stage 3b (HCC) 08/03/2024   Goals of care, counseling/discussion 08/03/2024   Sacral ulcer (HCC) 08/02/2024   Sacral decubitus ulcer, stage II (HCC) 01/06/2024   Pressure injury of left heel, unstageable (HCC) 01/06/2024   Arthritis 01/04/2024   Bladder outlet obstruction 01/04/2024   Hypercalcemia 01/04/2024   Aspiration into airway 01/04/2024   Protein-calorie malnutrition, severe  11/30/2023   CAP (community acquired pneumonia) 11/21/2023   Acute on chronic diastolic CHF (congestive heart failure) (HCC) 11/21/2023   Thrombocytopenia 10/04/2023   Rhabdomyolysis 10/04/2023   Community acquired pneumonia 10/04/2023   Influenza A with pneumonia 09/28/2023   Acute encephalopathy 01/01/2022   Complicated UTI (urinary tract infection) 01/01/2022   ARF (acute renal failure) 01/01/2022   History of CVA with residual deficit 01/01/2022   Dementia (HCC) 11/25/2021   Pyuria 11/25/2021   Type 2 diabetes mellitus (HCC) 11/24/2021   Stroke (HCC) 11/24/2021   Vitamin D  deficiency 11/24/2021   Immunodeficiency, unspecified 11/24/2021   Seizure (HCC) 11/24/2021   Sensorineural hearing loss 11/24/2021   Posttraumatic stress disorder 11/24/2021   Obstructive sleep apnea of adult 11/24/2021   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/24/2021   History of renal transplant 11/24/2021   Liver transplant status (HCC) 11/24/2021   Hemiplegia, unspecified affecting unspecified side (HCC) 11/24/2021   Essential hypertension 11/24/2021   Dysphagia, unspecified 11/24/2021   Chronic hepatitis C (HCC) 11/24/2021   Cirrhosis of liver (HCC) 11/24/2021   Cerebellar stroke syndrome 11/24/2021   Cardiomegaly 11/24/2021   Beta thalassemia trait 11/24/2021   Benign prostatic hyperplasia with lower urinary tract symptoms 11/24/2021   Atrial fibrillation (HCC) 11/24/2021   Liver transplant recipient [facility]) 11/24/2021   Acute CVA (cerebrovascular accident) (HCC) 11/24/2021   Bradycardia 07/04/2021   Anemia of chronic disease 05/21/2009   PCP:  [facility]:   [phone] [address], [place] - [address] [place] [address] Phone: [phone] Fax: [phone]  [facility] - [place], [place] - [address]  [place] [address] Phone: [phone] Fax: [phone]  [facility] [address] [place] [address] Phone: [phone] Fax: [phone]     Social Drivers of Health (SDOH) Social History: SDOH Screenings   Food Insecurity: No Food Insecurity (08/03/2024)  Housing: Low Risk (08/03/2024)  Transportation Needs: No Transportation Needs (08/03/2024)  Utilities: Not At Risk (08/03/2024)  Social Connections: Moderately Integrated (08/03/2024)  Tobacco Use: Medium Risk (08/16/2024)   SDOH Interventions:     Readmission Risk Interventions    11/27/2021   10:20 AM  Readmission Risk Prevention Plan  Transportation Screening Complete  PCP or Specialist Appt within 3-5 Days Complete  HRI or Home Care Consult Complete  Social Work Consult for Recovery Care Planning/Counseling Complete  Palliative Care Screening Not Applicable  Medication Review Oceanographer) Complete

## 2024-08-17 NOTE — Progress Notes (Signed)
°   08/17/24 1154  Vitals  Temp 97.9 F (36.6 C)  Temp Source Oral  BP (!) 111/52  MAP (mmHg) 69  BP Location Right Arm  BP Method Automatic  Patient Position (if appropriate) Lying  Pulse Rate 93  Pulse Rate Source Monitor  Resp 16  Level of Consciousness  Level of Consciousness Alert  MEWS COLOR  MEWS Score Color Comfort Care Only  Oxygen Therapy  SpO2 96 %  O2 Device Nasal Cannula  O2 Flow Rate (L/min) 2 L/min  Pain Assessment  Pain Scale Faces  Faces Pain Scale 0  MEWS Score  MEWS Temp 0  MEWS Systolic 0  MEWS Pulse 0  MEWS RR 0  MEWS LOC 0  MEWS Score 0

## 2024-08-17 NOTE — Progress Notes (Signed)
 This chaplain responded to PMT PA-Joseph consult for spiritual care. The chaplain understands the Pt. spouse is requesting prayer.   The Pt. is resting comfortably at the time of the visit. The Pt. spouse and daughter are sitting at the Pt. bedside. The chaplain understands the family's goal is to get the Pt. home as soon as possible. The Pt. has a special place at home that brings him peace. Intercessory prayer was accepted by the family, along with F/U spiritual care as needed.  Chaplain Leeroy Hummer [phone]

## 2024-08-17 NOTE — Progress Notes (Addendum)
 PROGRESS NOTE    Gilbert Wilson  FMW:[id number] DOB: 27-Jun-1946 DOA: 08/16/2024 PCP: [name]   Brief Narrative: Gilbert Wilson is a 78 y.o. male with a history of CVA, dementia, bedbound, hypertension, status post renal transplant, status post liver transplant, seizure disorder, bladder outlet obstruction, sacral decubitus ulcer, left ankle pressure ulcer.  Patient presented secondary to abnormal labs in addition to having poor oral intake for the last 3 days with evidence of failure to thrive secondary to underlying advanced dementia.  Palliative care was consulted and patient was transition to comfort measures with plan for discharge home with hospice.   Assessment/Plan:  Hypernatremia Secondary to dehydration.  Patient is about 5.8 L down.  Secondary to advanced dementia and failure to thrive with poor oral intake.  Patient started initially on half-normal saline IV fluids with worsening sodium.  Patient transition to D5 water , however after goals of care discussions, IV fluids were discontinued.  Advanced dementia Noted.  Significant contributor to current presentation.  Palliative care consulted with referral placed to hospice and plan for discharge home with hospice.  Inanition Secondary to underlying dementia.  Patient is no longer eating and drinking consistently and has evidence of significant dehydration.  Family has elected for home with hospice care.  History of seizures Patient is on Keppra  as an outpatient which was transition to Keppra  IV. - Continue Keppra  IV, will need to consider outpatient medication management for seizure disorder  History of stroke Patient is on Eliquis . Will discontinue at this time secondary to goals of care.  History of atrial fibrillation Unclear type. Patient is on Eliquis . Will discontinue at this time secondary to goals of care.  AKI on CKD stage IIIa AKI secondary to poor oral intake and resultant dehydration.  Patient initially given fluids, however, goals of care have changed and plan is for discharge home with hospice care.  Sacral decubitus ulcer Left ankle pressure injury Present on admission. Continue pain management.  Diabetes mellitus type 2 Appears to be well controlled based on prior hemoglobin A1C of 6.0%.   DVT prophylaxis: Comfort measures Code Status:   Code Status: Limited: Do not attempt resuscitation (DNR) -DNR-LIMITED -Do Not Intubate/DNI  Family Communication: Wife and daughter at bedside Disposition Plan: Discharge home with hospice care likely in 24 hours once DME has been delivered   Consultants:  Palliative care medicine  Procedures:  None  Antimicrobials: None    Subjective: Patient is unable to answer questions secondary to mental status.  Objective: BP (!) 127/58   Pulse 82   Temp 98.1 F (36.7 C) (Oral)   Resp (!) 39   SpO2 100%   Examination:  General exam: Appears calm. Chronically ill appearing. Respiratory system: Clear to auscultation. Mild tachypnea. Cardiovascular system: S1 & S2 heard. Gastrointestinal system: Abdomen is nondistended, soft and nontender. Normal bowel sounds heard. Central nervous system: Alert and oriented. No focal neurological deficits. Musculoskeletal: No edema. No calf tenderness Skin: No cyanosis. No rashes Psychiatry: Judgement and insight appear normal. Mood & affect appropriate.    Data Reviewed: I have personally reviewed following labs and imaging studies   Last CBC Lab Results  Component Value Date   WBC 18.6 (H) 08/16/2024   HGB 9.4 (L) 08/16/2024   HCT 30.7 (L) 08/16/2024   MCV 84.1 08/16/2024   MCH 25.8 (L) 08/16/2024   RDW 17.6 (H) 08/16/2024   PLT 376 08/16/2024     Last metabolic panel Lab Results  Component Value Date  GLUCOSE 123 (H) 08/17/2024   NA 158 (H) 08/17/2024   K 4.8 08/17/2024   CL 129 (H) 08/17/2024   CO2 19 (L) 08/17/2024   BUN 49 (H) 08/17/2024   CREATININE 1.77 (H)  08/17/2024   GFRNONAA 39 (L) 08/17/2024   CALCIUM  9.8 08/17/2024   PHOS 2.6 01/06/2024   PROT 6.8 08/16/2024   ALBUMIN  3.1 (L) 08/16/2024   BILITOT 0.5 08/16/2024   ALKPHOS 119 08/16/2024   AST 21 08/16/2024   ALT 28 08/16/2024   ANIONGAP 11 08/17/2024     Creatinine Clearance: Estimated Creatinine Clearance: 39.5 mL/min (A) (by C-G formula based on SCr of 1.77 mg/dL (H)).  Recent Results (from the past 240 hours)  Culture, blood (Routine X 2) w Reflex to ID Panel     Status: None   Collection Time: 08/10/24  7:15 PM   Specimen: BLOOD LEFT HAND  Result Value Ref Range Status   Specimen Description BLOOD LEFT HAND  Final   Special Requests   Final    BOTTLES DRAWN AEROBIC ONLY Blood Culture results may not be optimal due to an inadequate volume of blood received in culture bottles   Culture   Final    NO GROWTH 5 DAYS Performed at [facility], [address] [place], [place] [address]    Report Status 08/15/2024 FINAL  Final  Culture, blood (Routine X 2) w Reflex to ID Panel     Status: None   Collection Time: 08/10/24  7:15 PM   Specimen: BLOOD  Result Value Ref Range Status   Specimen Description BLOOD SITE NOT SPECIFIED  Final   Special Requests   Final    BOTTLES DRAWN AEROBIC ONLY Blood Culture results may not be optimal due to an inadequate volume of blood received in culture bottles   Culture   Final    NO GROWTH 5 DAYS Performed at [facility], [name], [place], [place] [address]    Report Status 08/15/2024 FINAL  Final  Resp panel by RT-PCR (RSV, Flu A&B, Covid) Anterior Nasal Swab     Status: None   Collection Time: 08/16/24  8:51 PM   Specimen: Anterior Nasal Swab  Result Value Ref Range Status   SARS Coronavirus 2 by RT PCR NEGATIVE NEGATIVE Final   Influenza A by PCR NEGATIVE NEGATIVE Final   Influenza B by PCR NEGATIVE NEGATIVE Final    Comment: (NOTE) The Xpert Xpress SARS-CoV-2/FLU/RSV plus assay is intended as an aid in the  diagnosis of influenza from Nasopharyngeal swab specimens and should not be used as a sole basis for treatment. Nasal washings and aspirates are unacceptable for Xpert Xpress SARS-CoV-2/FLU/RSV testing.  Fact Sheet for Patients: bloggercourse.com  Fact Sheet for Healthcare Providers: seriousbroker.it  This test is not yet approved or cleared by the [place]  FDA and has been authorized for detection and/or diagnosis of SARS-CoV-2 by FDA under an Emergency Use Authorization (EUA). This EUA will remain in effect (meaning this test can be used) for the duration of the COVID-19 declaration under Section 564(b)(1) of the Act, 21 U.S.C. section 360bbb-3(b)(1), unless the authorization is terminated or revoked.     Resp Syncytial Virus by PCR NEGATIVE NEGATIVE Final    Comment: (NOTE) Fact Sheet for Patients: bloggercourse.com  Fact Sheet for Healthcare Providers: seriousbroker.it  This test is not yet approved or cleared by the [place]  FDA and has been authorized for detection and/or diagnosis of SARS-CoV-2 by FDA under an  Emergency Use Authorization (EUA). This EUA will remain in effect (meaning this test can be used) for the duration of the COVID-19 declaration under Section 564(b)(1) of the Act, 21 U.S.C. section 360bbb-3(b)(1), unless the authorization is terminated or revoked.  Performed at [facility], [address], [place], [place] [address]       Radiology Studies: DG Chest 1 View Result Date: 08/16/2024 CLINICAL DATA:  Altered mental status. EXAM: CHEST  1 VIEW COMPARISON:  Chest radiograph dated 08/10/2024. FINDINGS: Shallow inspiration. Right lung base atelectasis or infiltrate. No pleural effusion or pneumothorax. Mild cardiomegaly. No acute osseous pathology. IMPRESSION: Right lung base atelectasis or infiltrate. Electronically Signed   By: Vanetta Chou M.D.   On: 08/16/2024 20:21      LOS: 1 day    Elgin Lam, MD [facility] 08/17/2024, 8:35 AM   If 7PM-7AM, please contact night-coverage [email or web address]

## 2024-08-18 ENCOUNTER — Other Ambulatory Visit (HOSPITAL_COMMUNITY): Payer: Self-pay

## 2024-08-18 DIAGNOSIS — Z515 Encounter for palliative care: Secondary | ICD-10-CM

## 2024-08-18 MED ORDER — MORPHINE SULFATE (CONCENTRATE) 20 MG/ML PO SOLN
5.0000 mg | ORAL | 0 refills | Status: AC | PRN
  Filled 2024-08-18: qty 30, 5d supply, fill #0

## 2024-08-18 MED ORDER — LORAZEPAM 2 MG/ML PO CONC
ORAL | 0 refills | Status: DC
  Filled 2024-08-18: qty 30, fill #0

## 2024-08-18 MED ORDER — LORAZEPAM 0.5 MG PO TABS
ORAL_TABLET | ORAL | 0 refills | Status: AC
  Filled 2024-08-18: qty 30, 7d supply, fill #0

## 2024-08-18 MED ORDER — OXYCODONE HCL 20 MG/ML PO CONC
5.0000 mg | ORAL | 0 refills | Status: DC | PRN
  Filled 2024-08-18: qty 30, 10d supply, fill #0

## 2024-08-18 NOTE — Plan of Care (Signed)
   Problem: Coping: Goal: Ability to adjust to condition or change in health will improve Outcome: Progressing   Problem: Metabolic: Goal: Ability to maintain appropriate glucose levels will improve Outcome: Progressing   Problem: Nutritional: Goal: Maintenance of adequate nutrition will improve Outcome: Progressing

## 2024-08-18 NOTE — Hospital Course (Signed)
 Gilbert Wilson is a 78 y.o. male with a history of CVA, dementia, bedbound, hypertension, status post renal transplant, status post liver transplant, seizure disorder, bladder outlet obstruction, sacral decubitus ulcer, left ankle pressure ulcer.  Patient presented secondary to abnormal labs in addition to having poor oral intake for the last 3 days with evidence of failure to thrive secondary to underlying advanced dementia.  Palliative care was consulted and patient was transition to comfort measures with discharge home with hospice.

## 2024-08-18 NOTE — Progress Notes (Signed)
 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 male  with past medical history of dementia, prior CVA, right side hemiplegia, liver and renal failure s/p transplant, seizure disorder, sacral decubitus ulcer admitted on 08/16/2024 with hypernatremia 2/2 poor PO intake. 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 Family during prior hospitalization requested full code, full scope knowing that interventions were limited and aware that patient is high risk for re-hospitalization. Patient admitted 4 days after discharge for poor PO intake resulting in metabolic derangements. Per EDP note by Maureen PA on 08/16/2024, patient initiated hospice services after poor PO intake but their team advised transfer to hospital for further management for abnormal labs. Per H&P by Franky MD 08/16/2024, family transition to DNR-limited with desire to transition to full comfort measures.    Palliative medicine team consulted for goals of care conversation.   SUMMARY OF RECOMMENDATIONS DNR comfort Plan for home with hospice with [facility] 12/18  Symptom Management:  Dilaudid  for pain/dyspnea/increased work of  breathing/RR>25 Tylenol  PRN pain/fever Biotin twice daily Benadryl  PRN itching Robinul  PRN secretions Haldol PRN agitation/delirium Ativan  PRN anxiety/seizure/sleep/distress Zofran  PRN nausea/vomiting Liquifilm Tears PRN dry eye  Code Status: DNR - Comfort  Prognosis: < 6 weeks  Discharge Planning: Home with Hospice  Discussed with: Nettey MD 08/18/2024 about discussion with family for at home pain, dyspnea, and seizure management with hospice. Discussed alternatives for pain/dyspnea/seizure management for discharge medications prior to hospice being initiated.   Subjective:   Subjective: Chart Reviewed. Updates received. Patient Assessed. Created space and opportunity for patient  and family to explore thoughts and feelings regarding current medical situation.  Today's Discussion: Met with patient, daughter, and wife bedside. Patient had a restless night per family but was able to settle down after with some medication administration but unclear as to what medication was administered as MAR did not show any recent administration. Patient was resting comfortably at time of visit. Daughter inquired about robinul  medication for secretions and if it would be available at home with hospice, shared with her that an oral form is available but which version will be up to the discretion the hospice formulary. Shared with her that a tablet form of robinul  can be crushed up and administered. Also shared with daughter that given the patient's seizure history and long term Keppra  use the medication will have to be adjusted now due to patient no longer taking PO. Shared with family that Ativan  tablet may be used in its place and crushed up and administered with liquid by mouth for seizure management. Also provided education on what the patient's symptoms may look like near  the end of life including increased secretions, long apnea periods, and increased somnolence. Provided Gone from my sight booklet to  help family prepare change in the patient as time draws near.   Review of Systems  Unable to perform ROS   Objective:   Primary Diagnoses: Present on Admission:  Hypernatremia  Immunodeficiency, unspecified  Essential hypertension  Cirrhosis of liver (HCC)  Atrial fibrillation (HCC)  Dementia (HCC)  Sacral decubitus ulcer, stage II (HCC)  Chronic ulcer of left heel (HCC)  Chronic kidney disease, stage 3b (HCC)  ARF (acute renal failure)   Vital Signs:  BP 102/66 (BP Location: Right Arm)   Pulse (!) 104   Temp 98 F (36.7 C) (Oral)   Resp 17   SpO2 98%   Physical Exam Constitutional:      Appearance: He is ill-appearing.  HENT:     Head: Normocephalic and atraumatic.     Nose: Congestion present.     Mouth/Throat:     Mouth: Mucous membranes are moist.  Cardiovascular:     Rate and Rhythm: Normal rate.     Pulses: Normal pulses.  Pulmonary:     Effort: Pulmonary effort is normal.  Abdominal:     Palpations: Abdomen is soft.  Skin:    General: Skin is warm and dry.  Neurological:     Mental Status: He is disoriented.     Palliative Assessment/Data: 10%   Existing Vynca/ACP Documentation: None  Thank you for allowing us  to participate in the care of Gilbert Wilson PMT will continue to support holistically.  I personally spent a total of 50 minutes in the care of the patient today including preparing to see the patient, getting/reviewing separately obtained history, performing a medically appropriate exam/evaluation, counseling and educating, referring and communicating with other health care professionals, documenting clinical information in the EHR, and coordinating care.   Gilbert Nilsen E Winter Jocelyn, PA-C  Palliative Medicine Team  Team Phone # [phone] (Nights/Weekends) 08/18/2024 8:14 AM

## 2024-08-18 NOTE — TOC Progression Note (Signed)
 Transition of Care (TOC) - Progression Note   Confirmed with wife and daughter at bedside that home oxygen has been delivered and they are ready for discharge.   Confirmed address.   PTAR paperwork and signed DNR form on chart.   Family aware PTAR will give an estimated time of pick up.   NCM messaged team , asking to notify NCM when NCM can call PTAR to get him on list  Patient Details  Name: Gilbert Wilson MRN: [id number] Date of Birth: Jan 06, 1946  Transition of Care [facility]) CM/SW Contact  Cordaryl Decelles, Powell Jansky, RN Phone Number: 08/18/2024, 8:20 AM  Clinical Narrative:       Expected Discharge Plan: Home w Hospice Care Barriers to Discharge: Continued Medical Work up               Expected Discharge Plan and Services   Discharge Planning Services: CM Consult Post Acute Care Choice: Hospice Living arrangements for the past 2 months: Single Family Home Expected Discharge Date: 08/18/24               DME Arranged:  (hospice will order any needed DME) DME Agency: NA         HH Agency: [facility] Date [facility] Agency Contacted: 08/17/24 Time HH Agency Contacted: 1245 Representative spoke with at [facility] Agency: Melissa   Social Drivers of Health (SDOH) Interventions SDOH Screenings   Food Insecurity: Patient Unable To Answer (08/17/2024)  Housing: Patient Unable To Answer (08/17/2024)  Transportation Needs: Patient Unable To Answer (08/17/2024)  Utilities: Patient Unable To Answer (08/17/2024)  Social Connections: Patient Unable To Answer (08/17/2024)  Tobacco Use: Medium Risk (08/16/2024)    Readmission Risk Interventions    11/27/2021   10:20 AM  Readmission Risk Prevention Plan  Transportation Screening Complete  PCP or Specialist Appt within 3-5 Days Complete  HRI or Home Care Consult Complete  Social Work Consult for Recovery Care Planning/Counseling Complete  Palliative Care Screening Not Applicable  Medication Review Special Educational Needs Teacher) Complete

## 2024-08-18 NOTE — Discharge Summary (Signed)
 Physician Discharge Summary   Patient: Gilbert Wilson MRN: [id number] DOB: 1945-10-25  Admit date:     08/16/2024  Discharge date: 08/18/2024  Discharge Physician: Elgin Lam, MD   PCP: [name]   Recommendations at discharge:  Hospice care  Discharge Diagnoses: Principal Problem:   Hypernatremia Active Problems:   History of CVA with residual deficit   Chronic ulcer of left heel (HCC)   History of renal transplant   Chronic kidney disease, stage 3b (HCC)   Atrial fibrillation (HCC)   Type 2 diabetes mellitus (HCC)   Immunodeficiency, unspecified   Liver transplant status (HCC)   Hemiplegia, unspecified affecting unspecified side (HCC)   Essential hypertension   Cirrhosis of liver (HCC)   Dementia (HCC)   ARF (acute renal failure)   Sacral decubitus ulcer, stage II (HCC)   Hospice care patient  Resolved Problems:   * No resolved hospital problems. *  Hospital Course: [name] is a 78 y.o. male with a history of CVA, dementia, bedbound, hypertension, status post renal transplant, status post liver transplant, seizure disorder, bladder outlet obstruction, sacral decubitus ulcer, left ankle pressure ulcer.  Patient presented secondary to abnormal labs in addition to having poor oral intake for the last 3 days with evidence of failure to thrive secondary to underlying advanced dementia.  Palliative care was consulted and patient was transition to comfort measures with discharge home with hospice.   Assessment and Plan:  Hypernatremia Secondary to dehydration.  Patient is about 5.8 L down.  Secondary to advanced dementia and failure to thrive with poor oral intake.  Patient started initially on half-normal saline IV fluids with worsening sodium.  Patient transition to D5 water , however after goals of care discussions, IV fluids were discontinued.   Advanced dementia Noted.  Significant contributor to current presentation.  Palliative care consulted  with referral placed to hospice and discharge home with hospice.   Inanition Secondary to underlying dementia.  Patient is no longer eating and drinking consistently and has evidence of significant dehydration.  Family has elected for home with hospice care.   History of seizures Patient is on Keppra  as an outpatient which was transition to Keppra  IV. Patient transitioned to scheduled ativan  for seizure control with as needed dosing as well.   History of stroke Patient is on Eliquis . Will discontinue at this time secondary to goals of care.   History of atrial fibrillation Unclear type. Patient is on Eliquis . Will discontinue at this time secondary to goals of care.   AKI on CKD stage IIIa AKI secondary to poor oral intake and resultant dehydration. Patient initially given fluids, however, goals of care have changed and plan is for discharge home with hospice care.   Sacral decubitus ulcer Left ankle pressure injury Present on admission. Continue pain management.   Diabetes mellitus type 2 Appears to be well controlled based on prior hemoglobin A1C of 6.0%.   Consultants:  Palliative care medicine   Procedures:  None  Disposition: Hospice care Diet recommendation: Comfort feeds   DISCHARGE MEDICATION: Allergies as of 08/18/2024       Reactions   Linezolid Other (See Comments)   Seizures   Levaquin [levofloxacin] Itching   Lisinopril Cough        Medication List     STOP taking these medications    acetaminophen  500 MG tablet Commonly known as: TYLENOL    amLODipine  5 MG tablet Commonly known as: NORVASC    amoxicillin -clavulanate 875-125 MG tablet Commonly known  as: AUGMENTIN    apixaban  5 MG Tabs tablet Commonly known as: ELIQUIS    cycloSPORINE  100 MG/ML microemulsion solution Commonly known as: NEORAL    doxycycline  100 MG tablet Commonly known as: VIBRA -TABS   Ensure High Protein Liqd   epoetin alfa 10000 UNIT/ML injection Commonly known as:  EPOGEN   ferrous sulfate  325 (65 FE) MG tablet   finasteride  5 MG tablet Commonly known as: PROSCAR    furosemide  20 MG tablet Commonly known as: LASIX    HYDROmorphone  2 MG tablet Commonly known as: DILAUDID    hydrOXYzine  10 MG tablet Commonly known as: ATARAX    Lantus  SoloStar 100 UNIT/ML Solostar Pen Generic drug: insulin  glargine   levETIRAcetam  750 MG tablet Commonly known as: KEPPRA    mirtazapine  15 MG tablet Commonly known as: REMERON    mycophenolate  180 MG EC tablet Commonly known as: MYFORTIC    nutrition supplement (JUVEN) Pack   omeprazole 20 MG capsule Commonly known as: PRILOSEC   senna-docusate 8.6-50 MG tablet Commonly known as: Senokot-S   sodium bicarbonate  650 MG tablet   tamsulosin  0.4 MG Caps capsule Commonly known as: FLOMAX        TAKE these medications    albuterol  108 (90 Base) MCG/ACT inhaler Commonly known as: VENTOLIN  HFA Inhale 2 puffs into the lungs every 6 (six) hours as needed for wheezing or shortness of breath.   gentamicin  cream 0.1 % Commonly known as: GARAMYCIN  Apply 1 Application topically daily. Apply to wound   LORazepam  0.5 MG tablet Commonly known as: Ativan  Take 1 tablet (0.5 mg total) by mouth every 6 (six) hours. May also take 4 tablets (2 mg total) every 4 (four) hours as needed for seizure. What changed: See the new instructions.   morphine  CONCENTRATE 10 mg / 0.5 ml concentrated solution Take 0.25-0.5 mLs (5-10 mg total) by mouth every 2 (two) hours as needed (Shortness of breath).        Follow-up Information     [facility] Follow up.   Specialty: Hospice and Palliative Medicine Contact information: [address] [place]  [address] [phone]               Discharge Exam: BP 102/66 (BP Location: Right Arm)   Pulse (!) 104   Temp 98 F (36.7 C) (Oral)   Resp 17   SpO2 98%   General exam: Appears calm and comfortable.   Condition at discharge: poor/comfort  measures  The results of significant diagnostics from this hospitalization (including imaging, microbiology, ancillary and laboratory) are listed below for reference.   Imaging Studies: DG Chest 1 View Result Date: 08/16/2024 CLINICAL DATA:  Altered mental status. EXAM: CHEST  1 VIEW COMPARISON:  Chest radiograph dated 08/10/2024. FINDINGS: Shallow inspiration. Right lung base atelectasis or infiltrate. No pleural effusion or pneumothorax. Mild cardiomegaly. No acute osseous pathology. IMPRESSION: Right lung base atelectasis or infiltrate. Electronically Signed   By: Vanetta Chou M.D.   On: 08/16/2024 20:21   DG Chest Port 1 View Result Date: 08/10/2024 EXAM: 1 VIEW(S) XRAY OF THE CHEST 08/10/2024 10:44:00 AM COMPARISON: 08/02/2024 CLINICAL HISTORY: Cough FINDINGS: LUNGS AND PLEURA: No focal pulmonary opacity. No pleural effusion. No pneumothorax. HEART AND MEDIASTINUM: No acute abnormality of the cardiac and mediastinal silhouettes. BONES AND SOFT TISSUES: No acute osseous abnormality. IMPRESSION: 1. No acute cardiopulmonary process. Electronically signed by: Lynwood Seip MD 08/10/2024 11:17 AM EST RP Workstation: [id number]   CT PELVIS W CONTRAST Result Date: 08/04/2024 EXAM: CT PELVIS, WITH IV CONTRAST 08/04/2024 04:19:17 PM TECHNIQUE: Axial images were  acquired through the pelvis with IV contrast. Reformatted images were reviewed. Automated exposure control, iterative reconstruction, and/or weight based adjustment of the mA/kV was utilized to reduce the radiation dose to as low as reasonably achievable. 75 mL (iohexol  (OMNIPAQUE ) 350 MG/ML injection 75 mL IOHEXOL  350 MG/ML SOLN) was administered intravenously. COMPARISON: None available. CLINICAL HISTORY: possible perirectal abscess FINDINGS: BONES: Chronic heterotopic ossification adjacent to the greater trochanter of the left femur. No acute fracture or focal osseous lesion. JOINTS: No dislocation. The joint spaces are normal. SOFT TISSUES:  Ulceration superficial to the lower sacrum and coccyx with abnormal soft tissue density material in the subcutaneous space that contacts the dorsal spine of the lower sacrum. No abscess or drainable fluid collection. Anasarca. INTRAPELVIC CONTENTS: Right lower quadrant transplant kidney with mild hydroureteronephrosis and severe periureteral stranding. Foley catheter within the urinary bladder. The urinary bladder is thick-walled but decompressed. The prostate measures 6.7 cm in transverse dimension. Calcific aortic atherosclerosis. IMPRESSION: 1. No perirectal abscess or drainable fluid collection. 2. Ulceration superficial to the lower sacrum and coccyx with abnormal soft tissue density material in the subcutaneous space that contacts the dorsal spine of the lower sacrum. No abscess or drainable fluid collection. 3. Right lower quadrant transplant kidney with mild hydroureteronephrosis and severe periureteral stranding, likely ascending urinary infection. Electronically signed by: Franky Stanford MD 08/04/2024 08:22 PM EST RP Workstation: [id number]   MR SACRUM SI JOINTS W WO CONTRAST Result Date: 08/02/2024 CLINICAL DATA:  Decubitus ulcer.  Evaluate for sacral osteomyelitis. EXAM: MRI PELVIS WITHOUT AND WITH CONTRAST TECHNIQUE: Multiplanar multisequence MR imaging of the pelvis was performed both before and after administration of intravenous contrast. CONTRAST:  8mL GADAVIST  GADOBUTROL  1 MMOL/ML IV SOLN COMPARISON:  Prostate MRI 03/31/2022.  Pelvic CT 11/20/2023. FINDINGS: Technical note: Despite efforts by the technologist and patient, mild motion artifact is present on today's exam and could not be eliminated. This reduces exam sensitivity and specificity. Bones/Joint/Cartilage There are areas of soft tissue ulceration posterior to the lower sacrum and coccyx and inferior to the right ischial tuberosity, further described below. There is T2 hyperintensity and low level enhancement within the distal sacrum and  coccyx, suspicious for early osteomyelitis. No definite cortical destruction identified on T1 weighted images. The upper sacrum and sacroiliac joints appear intact. No evidence of ischial osteomyelitis. Mild degenerative changes of both hips without evidence of joint effusion or osteomyelitis. Muscles and Tendons Generalized pelvic muscular atrophy and edema. There is asymmetric edema and mild enlargement of the left hip adductor muscles. There is heterogeneous low T1 and low T2 signal within the left ischiofemoral space. There is asymmetric left common hamstring tendinosis with partial tearing. Soft tissue As above, superficial soft tissue ulceration over the distal sacrum and coccyx with a probable small sinus tract extending to bone. No organized fluid collection is identified in this area. Additional decubitus ulcer and deep sinus tract extending to the right ischial tuberosity. Generalized decreased soft tissue enhancement surrounding the distal sacrum and coccyx, suspicious for devitalized soft tissue. There is heterogeneous T2 hyperintensity and enhancement within the presacral soft tissues, asymmetric to the right. Ill-defined right perirectal fluid collection demonstrates low-level peripheral enhancement and measures up to 5.0 x 1.7 cm on image 3 4/9, suspicious for an intrapelvic abscess. Heterogeneous enlargement of the prostate gland again noted, similar to previous MRI. 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 There is generalized subcutaneous edema. IMPRESSION: 1. Soft tissue ulceration over the distal sacrum and  coccyx with probable small sinus tract extending to bone. There is T2 hyperintensity and low level enhancement within the distal sacrum and coccyx, suspicious for early osteomyelitis. 2. Additional decubitus ulcer and deep sinus tract extending to the right ischial tuberosity. No evidence of right ischial osteomyelitis. 3. Ill-defined right  perirectal fluid collection with low-level peripheral enhancement, suspicious for an intrapelvic abscess. Given the motion and small field of view limitations of this examination, this may be better evaluated with pelvic CT with contrast. 4. Generalized pelvic muscular atrophy and edema. Asymmetric edema and mild enlargement of the left hip adductor muscles, likely reactive. Indeterminate heterogeneous low signal in the left ischiofemoral space, potentially calcifications or gas. This too could be further evaluated with CT. 5. Heterogeneous enlargement of the prostate gland, similar to previous MRI. Electronically Signed   By: Elsie Perone M.D.   On: 08/02/2024 18:01   DG Foot Complete Left Result Date: 08/02/2024 CLINICAL DATA:  Evaluate for osteomyelitis. EXAM: DG FOOT COMPLETE 3+V*L* COMPARISON:  None Available. FINDINGS: Diffuse decreased bone mineralization. Moderate degenerative change of the first MTP joint and mild degenerative change at the first IP joint. No acute fracture or dislocation. Degenerative changes over the midfoot and hindfoot. Soft tissue wound over the superficial aspect of the inferior plantar soft tissues adjacent the posterior calcaneus. No significant bone destruction to suggest osteomyelitis. Small vessel atherosclerotic disease is present. IMPRESSION: 1. Soft tissue wound over the inferior plantar soft tissues adjacent the posterior calcaneus. No significant bone destruction to suggest osteomyelitis. 2. Degenerative changes as described. Electronically Signed   By: Toribio Agreste M.D.   On: 08/02/2024 13:22   DG Chest Portable 1 View Result Date: 08/02/2024 CLINICAL DATA:  Dementia and diabetes.  Potential UTI. EXAM: PORTABLE CHEST 1 VIEW COMPARISON:  01/04/2024 FINDINGS: Lungs are hypoinflated without lobar consolidation, effusion or pneumothorax. Minimal stable prominence of the central pulmonary bronchovascular markings likely due to the degree of hypoinflation.  Cardiomediastinal silhouette and remainder of the exam is unchanged. IMPRESSION: Hypoinflation without acute cardiopulmonary disease. Electronically Signed   By: Toribio Agreste M.D.   On: 08/02/2024 13:10    Microbiology: Results for orders placed or performed during the hospital encounter of 08/16/24  Resp panel by RT-PCR (RSV, Flu A&B, Covid) Anterior Nasal Swab     Status: None   Collection Time: 08/16/24  8:51 PM   Specimen: Anterior Nasal Swab  Result Value Ref Range Status   SARS Coronavirus 2 by RT PCR NEGATIVE NEGATIVE Final   Influenza A by PCR NEGATIVE NEGATIVE Final   Influenza B by PCR NEGATIVE NEGATIVE Final    Comment: (NOTE) The Xpert Xpress SARS-CoV-2/FLU/RSV plus assay is intended as an aid in the diagnosis of influenza from Nasopharyngeal swab specimens and should not be used as a sole basis for treatment. Nasal washings and aspirates are unacceptable for Xpert Xpress SARS-CoV-2/FLU/RSV testing.  Fact Sheet for Patients: bloggercourse.com  Fact Sheet for Healthcare Providers: seriousbroker.it  This test is not yet approved or cleared by the [place]  FDA and has been authorized for detection and/or diagnosis of SARS-CoV-2 by FDA under an Emergency Use Authorization (EUA). This EUA will remain in effect (meaning this test can be used) for the duration of the COVID-19 declaration under Section 564(b)(1) of the Act, 21 U.S.C. section 360bbb-3(b)(1), unless the authorization is terminated or revoked.     Resp Syncytial Virus by PCR NEGATIVE NEGATIVE Final    Comment: (NOTE) Fact Sheet for Patients: bloggercourse.com  Fact Sheet for  Healthcare Providers: seriousbroker.it  This test is not yet approved or cleared by the [place]  FDA and has been authorized for detection and/or diagnosis of SARS-CoV-2 by FDA under an Emergency Use Authorization (EUA). This  EUA will remain in effect (meaning this test can be used) for the duration of the COVID-19 declaration under Section 564(b)(1) of the Act, 21 U.S.C. section 360bbb-3(b)(1), unless the authorization is terminated or revoked.  Performed at [facility], [address] [place], [place] [address]     Labs: CBC: Recent Labs  Lab 08/12/24 0559 08/13/24 0439 08/16/24 1642  WBC 15.7* 15.6* 18.6*  NEUTROABS  --   --  16.0*  HGB 9.2* 9.2* 9.4*  HCT 30.2* 29.6* 30.7*  MCV 81.6 80.7 84.1  PLT 396 416* 376   Basic Metabolic Panel: Recent Labs  Lab 08/12/24 0559 08/13/24 0439 08/16/24 1642 08/17/24 0440 08/17/24 1841  NA 148* 148* 156* 158* 158*  K 4.6 4.8 4.8 4.8  --   CL 120* 121* 126* 129*  --   CO2 18* 21* 19* 19*  --   GLUCOSE 156* 185* 163* 123*  --   BUN 33* 38* 48* 49*  --   CREATININE 1.41* 1.45* 1.73* 1.77*  --   CALCIUM  9.3 9.5 10.1 9.8  --   MG  --   --  2.2  --   --    Liver Function Tests: Recent Labs  Lab 08/16/24 1642  AST 21  ALT 28  ALKPHOS 119  BILITOT 0.5  PROT 6.8  ALBUMIN  3.1*   CBG: Recent Labs  Lab 08/13/24 1635 08/13/24 2123 08/16/24 2350 08/17/24 0439 08/17/24 0753  GLUCAP 146* 149* 122* 125* 116*    Discharge time spent: 35 minutes.  Signed: Elgin Lam, MD [facility] 08/18/2024

## 2024-08-18 NOTE — Plan of Care (Signed)
   Problem: Coping: Goal: Ability to adjust to condition or change in health will improve Outcome: Progressing

## 2024-08-18 NOTE — Consult Note (Signed)
 WOC Nurse Consult Note:  WOC consult performed remotely utilizing imaging and chart review  Reason for Consult:  Patient was seen last admission for wound care and was receiving PT hydrotherapy, family reports that he continues to receive wound care daily at home and would like to continue that. Patient is going home on hospice in the next 24-48 hours.   Chart and imaging reviewed, patient with written discharge orders to go home with hospice, awaiting EMS transport.  Noted patient seen by [facility] team on previous admission, notes reviewed. Palliative notes reviewed, indicating plans for full comfort measures  Wound type:  Stage 4 sacral pressure injury : 60% red, moist 40 % yellow slough Unstageable pressure injury to R ischium 95% yellow slough, 5 % red, moist Unstageable pressure injury to L heel: 100% black eschar Pressure Injury POA: Yes Measurement: see nursing flow sheets Wound bed: see above Drainage (amount, consistency, odor) see nursing flow sheets Periwound: intact Dressing procedure/placement/frequency:  Sacrum/ R ischium: Cleanse with NS, pack wound with saline soaked gauze, cover with silicone foam dressing. Change daily. Paint L heel with betadine daily, leave open to air.  Off load heels with Prevalon boots (lawson # [id number]).         WOC team will not follow patient at this time, please re consult if new needs arise.  Thank you,  Doyal Polite, MSN, RN, [facility] WOC Team [phone] (Available Mon-Fri 0700-1500)

## 2024-08-23 ENCOUNTER — Encounter (HOSPITAL_BASED_OUTPATIENT_CLINIC_OR_DEPARTMENT_OTHER): Admitting: General Surgery

## 2024-09-01 DEATH — deceased
